# Patient Record
Sex: Female | Born: 1980
Health system: Southern US, Community
[De-identification: ages and names within clinical notes are randomized; demographics above are authoritative.]

## PROBLEM LIST (undated history)

## (undated) ENCOUNTER — Emergency Department (HOSPITAL_COMMUNITY): Admission: EM | Discharge status: Absent without leave | Payer: Medicaid Other

## (undated) DIAGNOSIS — M1990 Unspecified osteoarthritis, unspecified site: Secondary | ICD-10-CM

## (undated) DIAGNOSIS — F200 Paranoid schizophrenia: Secondary | ICD-10-CM

## (undated) DIAGNOSIS — IMO0001 Reserved for inherently not codable concepts without codable children: Secondary | ICD-10-CM

## (undated) DIAGNOSIS — D219 Benign neoplasm of connective and other soft tissue, unspecified: Secondary | ICD-10-CM

## (undated) DIAGNOSIS — I10 Essential (primary) hypertension: Secondary | ICD-10-CM

## (undated) DIAGNOSIS — N7091 Salpingitis, unspecified: Secondary | ICD-10-CM

## (undated) DIAGNOSIS — F40240 Claustrophobia: Secondary | ICD-10-CM

## (undated) HISTORY — PX: OTHER SURGICAL HISTORY: SHX169

## (undated) HISTORY — PX: TOOTH EXTRACTION: SUR596

## (undated) HISTORY — DX: Claustrophobia: F40.240

## (undated) HISTORY — DX: Paranoid schizophrenia: F20.0

## (undated) HISTORY — PX: BUNIONECTOMY: SHX129

---
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
## 1999-01-09 ENCOUNTER — Emergency Department (HOSPITAL_COMMUNITY): Admission: EM | Admit: 1999-01-09 | Discharge: 1999-01-09 | Payer: Self-pay | Admitting: Emergency Medicine

## 1999-10-01 ENCOUNTER — Emergency Department (HOSPITAL_COMMUNITY): Admission: EM | Admit: 1999-10-01 | Discharge: 1999-10-01 | Payer: Self-pay | Admitting: Emergency Medicine

## 1999-10-08 ENCOUNTER — Emergency Department (HOSPITAL_COMMUNITY): Admission: EM | Admit: 1999-10-08 | Discharge: 1999-10-08 | Payer: Self-pay | Admitting: Emergency Medicine

## 1999-11-24 ENCOUNTER — Emergency Department (HOSPITAL_COMMUNITY): Admission: EM | Admit: 1999-11-24 | Discharge: 1999-11-24 | Payer: Self-pay | Admitting: Internal Medicine

## 2000-03-13 ENCOUNTER — Emergency Department (HOSPITAL_COMMUNITY): Admission: EM | Admit: 2000-03-13 | Discharge: 2000-03-13 | Payer: Self-pay | Admitting: *Deleted

## 2000-09-02 ENCOUNTER — Emergency Department (HOSPITAL_COMMUNITY): Admission: EM | Admit: 2000-09-02 | Discharge: 2000-09-02 | Payer: Self-pay

## 2001-06-03 ENCOUNTER — Inpatient Hospital Stay (HOSPITAL_COMMUNITY): Admission: AD | Admit: 2001-06-03 | Discharge: 2001-06-12 | Payer: Self-pay | Admitting: Obstetrics & Gynecology

## 2001-06-17 ENCOUNTER — Inpatient Hospital Stay (HOSPITAL_COMMUNITY): Admission: AD | Admit: 2001-06-17 | Discharge: 2001-06-17 | Payer: Self-pay | Admitting: *Deleted

## 2001-06-29 ENCOUNTER — Observation Stay (HOSPITAL_COMMUNITY): Admission: AD | Admit: 2001-06-29 | Discharge: 2001-06-30 | Payer: Self-pay | Admitting: Obstetrics

## 2001-06-30 ENCOUNTER — Encounter: Payer: Self-pay | Admitting: Obstetrics

## 2001-07-24 ENCOUNTER — Emergency Department (HOSPITAL_COMMUNITY): Admission: EM | Admit: 2001-07-24 | Discharge: 2001-07-24 | Payer: Self-pay | Admitting: Emergency Medicine

## 2001-11-29 ENCOUNTER — Emergency Department (HOSPITAL_COMMUNITY): Admission: EM | Admit: 2001-11-29 | Discharge: 2001-11-29 | Payer: Self-pay | Admitting: Emergency Medicine

## 2001-11-29 ENCOUNTER — Encounter: Payer: Self-pay | Admitting: Emergency Medicine

## 2002-03-23 ENCOUNTER — Emergency Department (HOSPITAL_COMMUNITY): Admission: EM | Admit: 2002-03-23 | Discharge: 2002-03-23 | Payer: Self-pay | Admitting: Emergency Medicine

## 2002-04-07 ENCOUNTER — Emergency Department (HOSPITAL_COMMUNITY): Admission: EM | Admit: 2002-04-07 | Discharge: 2002-04-07 | Payer: Self-pay | Admitting: Emergency Medicine

## 2002-08-11 ENCOUNTER — Emergency Department (HOSPITAL_COMMUNITY): Admission: EM | Admit: 2002-08-11 | Discharge: 2002-08-11 | Payer: Self-pay | Admitting: Emergency Medicine

## 2002-09-18 ENCOUNTER — Emergency Department (HOSPITAL_COMMUNITY): Admission: EM | Admit: 2002-09-18 | Discharge: 2002-09-18 | Payer: Self-pay | Admitting: Emergency Medicine

## 2002-09-18 ENCOUNTER — Encounter: Payer: Self-pay | Admitting: Emergency Medicine

## 2006-02-25 ENCOUNTER — Emergency Department (HOSPITAL_COMMUNITY): Admission: EM | Admit: 2006-02-25 | Discharge: 2006-02-25 | Payer: Self-pay | Admitting: Emergency Medicine

## 2006-04-30 ENCOUNTER — Emergency Department (HOSPITAL_COMMUNITY): Admission: EM | Admit: 2006-04-30 | Discharge: 2006-04-30 | Payer: Self-pay | Admitting: Emergency Medicine

## 2006-05-23 ENCOUNTER — Inpatient Hospital Stay (HOSPITAL_COMMUNITY): Admission: EM | Admit: 2006-05-23 | Discharge: 2006-05-26 | Payer: Self-pay | Admitting: *Deleted

## 2006-05-26 ENCOUNTER — Ambulatory Visit: Payer: Self-pay | Admitting: Psychiatry

## 2006-05-26 ENCOUNTER — Inpatient Hospital Stay (HOSPITAL_COMMUNITY): Admission: AD | Admit: 2006-05-26 | Discharge: 2006-05-28 | Payer: Self-pay | Admitting: *Deleted

## 2007-02-19 ENCOUNTER — Inpatient Hospital Stay (HOSPITAL_COMMUNITY): Admission: AD | Admit: 2007-02-19 | Discharge: 2007-02-19 | Payer: Self-pay | Admitting: Obstetrics & Gynecology

## 2007-05-10 ENCOUNTER — Emergency Department (HOSPITAL_COMMUNITY): Admission: EM | Admit: 2007-05-10 | Discharge: 2007-05-10 | Payer: Self-pay | Admitting: Emergency Medicine

## 2007-09-16 ENCOUNTER — Emergency Department (HOSPITAL_COMMUNITY): Admission: EM | Admit: 2007-09-16 | Discharge: 2007-09-18 | Payer: Self-pay | Admitting: Emergency Medicine

## 2009-01-09 ENCOUNTER — Emergency Department (HOSPITAL_COMMUNITY): Admission: EM | Admit: 2009-01-09 | Discharge: 2009-01-09 | Payer: Self-pay | Admitting: Emergency Medicine

## 2009-03-01 ENCOUNTER — Emergency Department (HOSPITAL_COMMUNITY): Admission: EM | Admit: 2009-03-01 | Discharge: 2009-03-01 | Payer: Self-pay | Admitting: Emergency Medicine

## 2009-04-25 ENCOUNTER — Emergency Department (HOSPITAL_COMMUNITY): Admission: EM | Admit: 2009-04-25 | Discharge: 2009-04-26 | Payer: Self-pay | Admitting: Emergency Medicine

## 2010-05-13 ENCOUNTER — Inpatient Hospital Stay (HOSPITAL_COMMUNITY): Admission: AD | Admit: 2010-05-13 | Discharge: 2010-05-13 | Payer: Self-pay | Admitting: Obstetrics and Gynecology

## 2010-06-07 ENCOUNTER — Emergency Department (HOSPITAL_COMMUNITY): Admission: EM | Admit: 2010-06-07 | Discharge: 2010-06-07 | Payer: Self-pay | Admitting: Emergency Medicine

## 2010-06-08 ENCOUNTER — Emergency Department (HOSPITAL_COMMUNITY): Admission: EM | Admit: 2010-06-08 | Discharge: 2010-06-08 | Payer: Self-pay | Admitting: Emergency Medicine

## 2010-07-25 ENCOUNTER — Ambulatory Visit: Payer: Self-pay | Admitting: Family

## 2010-07-25 ENCOUNTER — Inpatient Hospital Stay (HOSPITAL_COMMUNITY): Admission: AD | Admit: 2010-07-25 | Discharge: 2010-07-25 | Payer: Self-pay | Admitting: Obstetrics & Gynecology

## 2010-09-19 ENCOUNTER — Emergency Department (HOSPITAL_COMMUNITY)
Admission: EM | Admit: 2010-09-19 | Discharge: 2010-09-19 | Payer: Self-pay | Source: Home / Self Care | Admitting: Emergency Medicine

## 2010-09-25 ENCOUNTER — Emergency Department (HOSPITAL_COMMUNITY): Admission: EM | Admit: 2010-09-25 | Discharge: 2010-02-05 | Payer: Self-pay | Admitting: Emergency Medicine

## 2010-12-29 LAB — URINALYSIS, ROUTINE W REFLEX MICROSCOPIC
Ketones, ur: NEGATIVE mg/dL
Nitrite: NEGATIVE
Protein, ur: NEGATIVE mg/dL
pH: 5 (ref 5.0–8.0)

## 2010-12-29 LAB — DIFFERENTIAL
Basophils Relative: 0 % (ref 0–1)
Eosinophils Absolute: 0.1 10*3/uL (ref 0.0–0.7)
Lymphs Abs: 3.6 10*3/uL (ref 0.7–4.0)
Neutrophils Relative %: 55 % (ref 43–77)

## 2010-12-29 LAB — COMPREHENSIVE METABOLIC PANEL
ALT: 20 U/L (ref 0–35)
CO2: 24 mEq/L (ref 19–32)
Calcium: 9.3 mg/dL (ref 8.4–10.5)
GFR calc non Af Amer: >60 mL/min (ref >60)
Glucose, Bld: 92 mg/dL (ref 70–99)
Sodium: 137 mEq/L (ref 135–145)

## 2010-12-29 LAB — LIPASE, BLOOD: Lipase: 26 U/L (ref 11–59)

## 2010-12-29 LAB — CBC
HCT: 40.4 % (ref 36.0–46.0)
Hemoglobin: 14 g/dL (ref 12.0–15.0)
MCH: 30 pg (ref 26.0–34.0)
MCHC: 34.7 g/dL (ref 30.0–36.0)
MCV: 86.7 fL (ref 78.0–100.0)

## 2010-12-29 LAB — POCT CARDIAC MARKERS
Myoglobin, poc: 45.6 ng/mL (ref 12–200)
Troponin i, poc: <0.05 ng/mL (ref 0.00–0.09)

## 2010-12-29 LAB — ETHANOL: Alcohol, Ethyl (B): 264 mg/dL — ABNORMAL HIGH (ref 0–10)

## 2010-12-31 LAB — URINALYSIS, ROUTINE W REFLEX MICROSCOPIC
Ketones, ur: NEGATIVE mg/dL
Nitrite: NEGATIVE
Protein, ur: NEGATIVE mg/dL

## 2010-12-31 LAB — WET PREP, GENITAL: Yeast Wet Prep HPF POC: NONE SEEN

## 2011-01-01 LAB — URINALYSIS, ROUTINE W REFLEX MICROSCOPIC
Glucose, UA: NEGATIVE mg/dL
Protein, ur: 30 mg/dL — AB
pH: 5 (ref 5.0–8.0)

## 2011-01-01 LAB — WET PREP, GENITAL
Clue Cells Wet Prep HPF POC: NONE SEEN
Yeast Wet Prep HPF POC: NONE SEEN

## 2011-01-01 LAB — URINE CULTURE
Colony Count: 75000
Culture  Setup Time: 201108220336

## 2011-01-01 LAB — URINE MICROSCOPIC-ADD ON

## 2011-01-01 LAB — POCT PREGNANCY, URINE: Preg Test, Ur: NEGATIVE

## 2011-01-03 LAB — CBC
HCT: 38.7 % (ref 36.0–46.0)
Hemoglobin: 12.9 g/dL (ref 12.0–15.0)
MCV: 92.5 fL (ref 78.0–100.0)
RBC: 4.18 MIL/uL (ref 3.87–5.11)
WBC: 8.6 10*3/uL (ref 4.0–10.5)

## 2011-01-03 LAB — GC/CHLAMYDIA PROBE AMP, GENITAL: Chlamydia, DNA Probe: NEGATIVE

## 2011-01-03 LAB — URINALYSIS, ROUTINE W REFLEX MICROSCOPIC
Bilirubin Urine: NEGATIVE
Leukocytes, UA: NEGATIVE
Nitrite: NEGATIVE
Specific Gravity, Urine: 1.02 (ref 1.005–1.030)
Urobilinogen, UA: 0.2 mg/dL (ref 0.0–1.0)
pH: 5.5 (ref 5.0–8.0)

## 2011-01-03 LAB — URINE MICROSCOPIC-ADD ON

## 2011-01-03 LAB — POCT PREGNANCY, URINE: Preg Test, Ur: NEGATIVE

## 2011-01-26 LAB — COMPREHENSIVE METABOLIC PANEL
ALT: 11 U/L (ref 0–35)
Albumin: 3.9 g/dL (ref 3.5–5.2)
Alkaline Phosphatase: 66 U/L (ref 39–117)
BUN: 6 mg/dL (ref 6–23)
Chloride: 107 mEq/L (ref 96–112)
Potassium: 4.2 mEq/L (ref 3.5–5.1)
Sodium: 142 mEq/L (ref 135–145)
Total Bilirubin: 0.4 mg/dL (ref 0.3–1.2)

## 2011-01-26 LAB — URINALYSIS, ROUTINE W REFLEX MICROSCOPIC
Bilirubin Urine: NEGATIVE
Hgb urine dipstick: NEGATIVE
Nitrite: NEGATIVE
Specific Gravity, Urine: 1.003 — ABNORMAL LOW (ref 1.005–1.030)
Urobilinogen, UA: 0.2 mg/dL (ref 0.0–1.0)
pH: 6 (ref 5.0–8.0)

## 2011-01-26 LAB — WET PREP, GENITAL: Yeast Wet Prep HPF POC: NONE SEEN

## 2011-01-26 LAB — DIFFERENTIAL
Basophils Absolute: 0.1 10*3/uL (ref 0.0–0.1)
Basophils Relative: 1 % (ref 0–1)
Eosinophils Absolute: 0.1 10*3/uL (ref 0.0–0.7)
Eosinophils Relative: 1 % (ref 0–5)
Monocytes Absolute: 0.9 10*3/uL (ref 0.1–1.0)
Neutro Abs: 6.6 10*3/uL (ref 1.7–7.7)

## 2011-01-26 LAB — CBC
HCT: 38.6 % (ref 36.0–46.0)
Hemoglobin: 12.6 g/dL (ref 12.0–15.0)
Platelets: 314 10*3/uL (ref 150–400)
WBC: 10.8 10*3/uL — ABNORMAL HIGH (ref 4.0–10.5)

## 2011-01-26 LAB — POCT PREGNANCY, URINE: Preg Test, Ur: NEGATIVE

## 2011-03-06 NOTE — Discharge Summary (Signed)
NAMEKRISTYL, ATHENS NO.:  [id number]   MEDICAL RECORD NO.:  [id number]          PATIENT TYPE:  INP   LOCATION:  6711                         FACILITY:  [facility]   PHYSICIAN:  Lonia Blood, M.D.       DATE OF BIRTH:  1981/06/18   DATE OF ADMISSION:  05/23/2006  DATE OF DISCHARGE:  05/25/2006                                 DISCHARGE SUMMARY   DISCHARGE DIAGNOSES:  1.  Intentional medication overdose.  2.  Suicide attempt.  3.  Hypoglycemia secondary to the metformin that the patient ingested to      commit suicide, now resolved.  4.  History of tubo-ovarian abscess.  5.  Pelvic inflammatory disease in 2002.  6.  Recent infection with chlamydia.  7.  Cocaine abuse.  8.  Alcohol abuse.  9.  Marijuana abuse.   DISCHARGE MEDICATIONS:  1.  Folic acid 1 mg daily.  2.  Vitamin B 100 mg daily.   CONDITION ON DISCHARGE:  The patient will be transferred to the [facility] where she will require inpatient psychiatric admission as per  the psychiatry notes.   PROCEDURES THIS ADMISSION:  No procedures were done.   CONSULTATIONS:  Dr. Jeanie Sewer from psychiatry.   HISTORY AND PHYSICAL:  For admission history and physical, refer to the  dictated history and physical by Dr. Darene Lamer on May 23, 2006.   HOSPITAL COURSE:  1.  Hypoglycemia secondary to metformin overdose.  Ms. Mcbroom was      admitted to the regular unit of [facility].  She was placed on      intravenous dextrose and she was offered a regular diet.  Her      hypoglycemia resolved gradually throughout the day of May 24, 2006.      By May 25, 2006, the patient's CBGs had been above 80 and she was      deemed stable and safe to be discharged to the [facility]      for inpatient psychiatric admission.  2.  Suicidal gesture, suicidal attempt with metformin.  The patient will be      transferred to [facility] for inpatient psychiatric evaluation      and treatment  as needed.  3.  Polysubstance abuse including alcohol, cannabis, cocaine.  We have      counseled Ms. Matura about dangers      in using illicit drugs, as well as heavy amounts of alcohol.  She voices      understanding and she will probably follow up with alcohol rehab      services as needed.  4.  History of alcohol abuse.  The patient was started on folic acid and      thiamine at the time of discharge.      Lonia Blood, M.D.  Electronically Signed     SL/MEDQ  D:  05/25/2006  T:  05/25/2006  Job:  [id number]

## 2011-03-06 NOTE — Consult Note (Signed)
NAME:  Jessica Case, KRIST NO.:  [id number]   MEDICAL RECORD NO.:  [id number]          PATIENT TYPE:  IPS   LOCATION:  NA                            [facility]  [facility]   PHYSICIAN:  Antonietta Breach, M.D.  DATE OF BIRTH:  06/15/1981   DATE OF CONSULTATION:  DATE OF DISCHARGE:                                   CONSULTATION   DATE OF CONSULTATION:  May 24, 2006   REQUESTING PHYSICIAN:  Lonia Blood, M.D.   REASON FOR CONSULTATION:  Depression and overdose.   HISTORY OF PRESENT ILLNESS:  The patient is a 30 year old female admitted to  the [facility] on May 23, 2006 after an overdose of Metformin.   The patient states that she recently has been drinking 3/5 of alcohol per  day as well as using regular crack cocaine.  She has had thoughts of killing  herself and intended to kill herself with this overdose.  Her mood is very  depressed.  Her energy is poor.  Her concentration is poor.   The patient obtained a Metformin from one of her relative's supplies in the  cabinet at her house.   PAST PSYCHIATRIC HISTORY:  The patient has a history of multiple suicide  attempts and multiple psychiatric admissions.  She is a poor historian at  this time.   FAMILY PSYCHIATRIC HISTORY:  None known.   SOCIAL HISTORY:  The patient is single.  In a separate history in the  medical record the patient stated that she was drinking up to 3 liters of  wine per day.  She also acknowledge smoking marijuana.  She lives with her  relatives.  Occupation:  Unemployed.   GENERAL MEDICAL PROBLEMS:  Status post overdose of Metformin, cocaine and  alcohol.   MEDICATIONS:  The MAR is reviewed.   ALLERGIES:  No known drug allergies.   PSYCHOTROPIC:  At this time are none.   LABORATORY DATA:  Basic metabolic panel is unremarkable.  Urine pregnancy  test is negative.  Tylenol is negative.  Aspirin is negative.  Urine drug  screen was positive for cocaine.  Alcohol is negative.   REVIEW OF SYSTEMS:  Constitutional:  Afebrile.  Head:  No trauma.  Eyes:  No visual changes.  Ears:  No hearing impairment.  Nose:  No rhinorrhea.  Mouth and throat:  No sore throat.  Neurologic:  Unremarkable.  Psychiatric:  As above.  Cardiovascular:  No chest pain, palpitations or edema.  Respiratory:  No coughing or wheezing.  Gastrointestinal:  No nausea, vomiting or diarrhea.  Genitourinary:  No dysuria.  Skin:  Unremarkable.  Endocrine and metabolic:  Unremarkable.  Hematologic and lymphatic:  Unremarkable.  Musculoskeletal:  No deformities, weakness or atrophy.   EXAMINATION:  Vital signs:  Temperature 98.6, pulse 72, respirations 20,  blood pressure 109/63.   METAL STATUS EXAM:  The patient is a young female sitting in her hospital  bed with fair eye contact, constricted affect, depressed mood, decreased  concentration.  She is oriented to all spheres.  Her fund of knowledge and  intelligence are estimated at average.  Thought process  is coherent.  Thought content suicidal ideation as in the history of present illness.  No  thoughts of harming others.  No delusions.  No hallucinations.  Judgment is  intact for the need of treatment.  Her insight is also intact partially for  her depression and substance dependence.  Her memory is intact to immediate,  recent, remote except for the blackout periods of overdose.   ASSESSMENT:  Axis I:  Mood disorder not otherwise specified, depressed  (functional and substance induced elements).  Diagnosis:  Polysubstance dependence.  Axis II:  Deferred.  Axis III:  See general medical problems.  Axis IV:  Primary support group.  Axis V:  35.   The patient is still at risk to harm herself.   RECOMMENDATIONS:  1.  Continue the sitter for suicide precautions.  2.  Psychotropic medication is deferred at this time.  3.  Once patient is medically cleared would admit her a psychiatric ward for      dual diagnosis evaluation and treatment.   The patient agrees to      voluntary admission.      Antonietta Breach, M.D.  Electronically Signed     JW/MEDQ  D:  05/24/2006  T:  05/25/2006  Job:  [id number]

## 2011-03-06 NOTE — Consult Note (Signed)
NAME:  Jessica Case, Jessica Case NO.:  [id number]   MEDICAL RECORD NO.:  [id number]          PATIENT TYPE:  IPS   LOCATION:  0507                          [facility]ACILITY:  [facility]   PHYSICIAN:  Antonietta Breach, M.D.  DATE OF BIRTH:  1981-05-07   DATE OF CONSULTATION:  05/26/2006  DATE OF DISCHARGE:                                   CONSULTATION   [name] continues with several days of depressed mood, poor  concentration, poor energy, anhedonia and suicidal thoughts.  She is now  medically clear to leave the [facility].   Her CBGs have been running 109, 124 and 87.  Vital signs:  Temperature 97.5,  pulse 75, respirations 22, blood pressure 115/77.   On exam, as above.  The patient is able to cooperate.  Her thought process  is coherent.  Her concentration is decreased.  Her judgment is impaired.   ASSESSMENT:  293.83 mood disorder not otherwise specified, depressed.   Major depressive disorder recurrent, severe.   [name] has a history of multiple suicide attempts including the  intentional overdose that precipitated this hospitalization.   RECOMMENDATIONS:  1.  Admission to a psychiatric unit for further evaluation and treatment of      depression.  2.  Continue the sitter for suicide prevention on the medical ward until      transferred.  3.  Psychotropics deferred.      Antonietta Breach, M.D.  Electronically Signed     JW/MEDQ  D:  05/26/2006  T:  05/26/2006  Job:  [id number]

## 2011-03-06 NOTE — H&P (Signed)
NAME:  RAZIYAH, VANVLECK NO.:  [id number]   MEDICAL RECORD NO.:  [id number]          PATIENT TYPE:  INP   LOCATION:  1826                         FACILITY:  [facility]   PHYSICIAN:  Salomon Fick, N.P.    DATE OF BIRTH:  June 17, 1981   DATE OF ADMISSION:  05/23/2006  DATE OF DISCHARGE:                                HISTORY & PHYSICAL   CHIEF COMPLAINT:  Overdose of metformin.   HISTORY OF PRESENT ILLNESS:  Ms. Bost is a 30 year old, African-  American lady who was brought to the ER by her relatives after she  apparently took 10 pills of metformin at approximately 3 p.m.  She states  she wanted to kill herself.  She took these 10 pills from the cabinet; it  belongs to one of her relatives who lives with her.  She also smoked crack  cocaine after that, and right now she is drowsy but easily arousable.  She  denies having any other complaints at this point in time.  After coming to  the ER, [facility] was contacted.  They accepted the patient, but the  patient was hypoglycemic and so is getting admitted to the medical team.   PAST MEDICAL HISTORY:  None.   PAST SURGICAL HISTORY:  None.   ALLERGIES:  None.   MEDICATIONS:  None.   SOCIAL HISTORY:  She smokes one pack per day for approximately seven years.  She takes alcohol on a regular basis.  She states she drinks up to three  liters of wine a day.  She smokes crack on a regular basis.  She also takes  marijuana.  She is unemployed and lives with relatives.   FAMILY HISTORY:  Her parents are healthy.   REVIEW OF SYSTEMS:  She denies any recent weight loss or weight gain.  No  fever, chills.  HEENT:  No headaches, no blurred vision, no sore throat.  CARDIOVASCULAR:  Denies any chest pain or palpitations.  RESPIRATORY:  No  shortness of breath, cough.  GASTROINTESTINAL:  Denies abdominal pain,  nausea, vomiting, diarrhea or constipation.   PHYSICAL EXAMINATION:  GENERAL:  She is alert but dozes off  easily.  She is  oriented to time, place and person.  VITAL SIGNS:  Temperature 98.3, pulse rate of 90, blood pressure 138/77,  respiratory rate 28.  HEENT:  Normocephalic.  Pupils bilaterally __________.  Mucosa moist.  NECK:  Supple, no JVD, no thyromegaly.  CARDIOVASCULAR:  S1, S2 sounds heard.  Regular rate and rhythm.  No murmurs,  rubs or gallops.  CHEST:  Clear to auscultation.  ABDOMEN:  Soft.  Bowel sounds heard.  NEUROLOGIC:  No focal deficits.   LABORATORY DATA:  She had a pregnancy test done in the ER, which was  negative.  Her urine drug screen was positive for cocaine.  Her urinalysis  was negative.  She had a sodium of 139, potassium 3.1, chloride 105, BUN 12,  creatinine 1.1, glucose of 60.   IMPRESSION:  1.  Hypoglycemia secondary to metformin overdose.  2.  Suicidal ideation.   PLAN:  A 30 year old lady with suicidal ideation,  took 10 pills of  metformin.  She is mildly hypoglycemic secondary to her metformin.  The  metformin clears her body in about 12-16 hours, so we have to watch her  blood sugars until then.  We will get q.2h. blood sugars on her for the next  8 hours and I also put her on D5 normal saline to keep her blood sugars up.  Her potassium is low at 3.1; we will replace potassium.  She has been  accepted by [facility], so she will go to [facility] on  discharge.  She will have to have a sitter with her because of suicidal  ideation on the floor.      Salomon Fick, N.P.     MES/MEDQ  D:  05/23/2006  T:  05/23/2006  Job:  [id number]

## 2011-03-06 NOTE — Discharge Summary (Signed)
[facility]  Patient:    Jessica Case, Jessica Case Visit Number: [id number] MRN: [id number]          Service Type: GYN Location: [facility] Attending Physician:  Michaelle Copas Dictated by:   Marlinda Mike, CNM Admit Date:  06/17/2001 Discharge Date: 06/17/2001                             Discharge Summary  ADMISSION DIAGNOSES:          1. Tubo-ovarian abscess.                               2. Pelvic inflammatory disease.  HISTORY OF PRESENT ILLNESS:   The patient is a transfer from the [facility] Emergency Room.  GYN on call at [facility] was Miguel Aschoff, M.D.  On transfer to [facility], care transferred to the teaching service.  The patient is a 30 year old, gravida 2, para 0-1-1-0, with a presenting complaint of abdominal pain.  The patient has no surgical history.  She has a history of asthma and also irregular heartbeat.  She has been treated in the past for gonorrhea, chlamydia, and abnormal vaginal bleeding.  The patient admits to tobacco use, three to four cigarettes a day, alcohol use four to five times a weeks, and also use of marijuana and crack cocaine.  Last menstrual period of May 21, 2001.  ADMISSION ORDERS:             Cefotetan and doxycycline IV per Miguel Aschoff, M.D.  On day #2 of hospitalization, antibiotics changed to gentamicin and clindamycin and started on Motrin therapy per Leonette Most A. Clearance Coots, M.D.  The patient received Phenergan p.r.n. IV/p.o. as needed for nausea and vomiting. Medicine during the hospitalization for pain:  Demerol initially and then changed to Tylox.  Tylox produced itching.  Medicine discontinued.  Benadryl p.r.n. for the itching and changed to Darvocet p.r.n.  LABORATORY WORK DURING HOSPITALIZATION:              White blood cell count 19.6, hemoglobin 11.2, hematocrit 33.  Wet prep was positive for white blood cells, bacteria, and many yeast.  GC culture was  negative.  Chlamydia culture was positive.  The hepatitis screen was negative.  The HIV screen was negative.  The RPR screen was negative.  The urine drug screen was positive for cocaine.  HOSPITAL COURSE:              The patient remained afebrile during the hospitalization.  There were some labile blood pressures with diastolics in the 80s and 90s from a baseline of diastolic rate of 65H on days #5 and #6 of hospitalization, which then resolved to normal blood pressure range.  DISPOSITION:                  The patient was discharged on day #9.  FOLLOW-UP:                    To follow up at the GYN clinic in two weeks with Roseanna Rainbow, M.D.  The patient is to call with any further abdominal pain, discharge, or bleeding.  DISCHARGE MEDICATIONS:        A course of doxycycline and Flagyl x 2 weeks.  DISCHARGE STATUS:  Stable. Dictated by:   Marlinda Mike, CNM Attending Physician:  Michaelle Copas DD:  06/29/01 TD:  06/29/01 Job: [id number]

## 2011-03-06 NOTE — Discharge Summary (Signed)
NAME:  Jessica Case, Jessica Case NO.:  [id number]   MEDICAL RECORD NO.:  [id number]          PATIENT TYPE:  IPS   LOCATION:  0507                          [facility]ACILITY:  [facility]   PHYSICIAN:  Anselm Jungling, MD  DATE OF BIRTH:  01/15/1981   DATE OF ADMISSION:  05/26/2006  DATE OF DISCHARGE:  05/28/2006                                 DISCHARGE SUMMARY   IDENTIFYING DATA/REASON FOR ADMISSION:  The patient is a 30 year old single  African American female admitted after a suicide attempt via overdose.  She  came to [place] with a history of cocaine dependence and [facility].  This was her first ever inpatient psychiatric  hospitalization that we are aware of.  She denied any previous treatment for  depression.  She indicated that she wanted help and regretted her overdose.  Please refer to the admission note for further details pertaining to the  symptoms, circumstances and history that led to her hospitalization.   INITIAL DIAGNOSTIC IMPRESSION:  She was given an initial AXIS I diagnosis of  depressive disorder not otherwise specified, rule out substance-induced mood  disorder, and cocaine dependence.   MEDICAL/LABORATORY:  The patient was medically and physically assessed by  the psychiatric nurse practitioner after having been treated for her  overdose in another facility.  There were no active or chronic medical  problems.   HOSPITAL COURSE:  The patient was admitted to the adult inpatient  psychiatric service.  She presented as a well-nourished, well-developed  [place] female who was alert, fully oriented, pleasant and  friendly.  She stated I'm doing fine in the first interview.  However, she  easily became tearful and depressed over her hospitalization.  She denied  suicidal ideation, stating I will never do that again.  She did not  present with any signs or symptoms of psychosis or thought disorder.   The patient agreed to a  trial of Lexapro 10 mg daily for depression.  She  was placed on Symmetrel 100 mg b.i.d. for cocaine cravings.  She  participated in various therapeutic groups and activities.  She was somewhat  hyperactive at times during her hospital stay, but basically appropriate,  redirectable, and this did not appear to reflect any underlying psychosis or  mood disorder.   On the third hospital day, the patient indicated that she wanted to be  discharged.  She continued to deny suicidal ideation and stated that she  wanted to get off cocaine for good.   AFTERCARE:  The patient was to follow up at the [facility] with an  appointment on June 03, 2006.  She was instructed to attend daily NA or AA  meetings, and lists of local programs and meetings were given to her.   DISCHARGE MEDICATIONS:  1. Lexapro 10 mg daily.  2. Symmetrel 100 mg twice daily.  3. Risperdal 0.5 mg at bedtime, which was started during her stay to      address anxiety and insomnia, with good results.   DISCHARGE DIAGNOSES:  AXIS I:  Cocaine dependence.  Substance-induced mood  disorder, depression.  AXIS II:  Deferred.  AXIS III:  No acute or chronic illnesses.  AXIS IV:  Stressors:  Severe.  AXIS V:  GAF on discharge 60.      Anselm Jungling, MD  Electronically Signed     SPB/MEDQ  D:  05/31/2006  T:  05/31/2006  Job:  [id number]

## 2011-05-15 ENCOUNTER — Emergency Department (HOSPITAL_COMMUNITY)
Admission: EM | Admit: 2011-05-15 | Discharge: 2011-05-15 | Disposition: A | Discharge status: Home / Self Care | Payer: Self-pay | Attending: Emergency Medicine | Admitting: Emergency Medicine

## 2011-05-15 ENCOUNTER — Emergency Department (HOSPITAL_COMMUNITY): Payer: Self-pay

## 2011-05-15 DIAGNOSIS — F329 Major depressive disorder, single episode, unspecified: Secondary | ICD-10-CM | POA: Insufficient documentation

## 2011-05-15 DIAGNOSIS — M79609 Pain in unspecified limb: Secondary | ICD-10-CM | POA: Insufficient documentation

## 2011-05-15 DIAGNOSIS — K5900 Constipation, unspecified: Secondary | ICD-10-CM | POA: Insufficient documentation

## 2011-05-15 DIAGNOSIS — R1012 Left upper quadrant pain: Secondary | ICD-10-CM | POA: Insufficient documentation

## 2011-05-15 DIAGNOSIS — M21619 Bunion of unspecified foot: Secondary | ICD-10-CM | POA: Insufficient documentation

## 2011-05-15 DIAGNOSIS — F3289 Other specified depressive episodes: Secondary | ICD-10-CM | POA: Insufficient documentation

## 2011-05-15 LAB — DIFFERENTIAL
Basophils Absolute: 0 10*3/uL (ref 0.0–0.1)
Eosinophils Absolute: 0.1 10*3/uL (ref 0.0–0.7)
Eosinophils Relative: 1 % (ref 0–5)
Lymphocytes Relative: 23 % (ref 12–46)
Lymphs Abs: 2.5 10*3/uL (ref 0.7–4.0)
Neutrophils Relative %: 66 % (ref 43–77)

## 2011-05-15 LAB — POCT PREGNANCY, URINE: Preg Test, Ur: NEGATIVE

## 2011-05-15 LAB — CBC
HCT: 35.7 % — ABNORMAL LOW (ref 36.0–46.0)
MCV: 86.7 fL (ref 78.0–100.0)
Platelets: 290 10*3/uL (ref 150–400)
RBC: 4.12 MIL/uL (ref 3.87–5.11)
RDW: 13.3 % (ref 11.5–15.5)
WBC: 11 10*3/uL — ABNORMAL HIGH (ref 4.0–10.5)

## 2011-05-15 LAB — URINALYSIS, ROUTINE W REFLEX MICROSCOPIC
Leukocytes, UA: NEGATIVE
Nitrite: NEGATIVE
Specific Gravity, Urine: 1.007 (ref 1.005–1.030)
Urobilinogen, UA: 0.2 mg/dL (ref 0.0–1.0)
pH: 7 (ref 5.0–8.0)

## 2011-05-15 LAB — POCT I-STAT, CHEM 8
Chloride: 105 mEq/L (ref 96–112)
HCT: 41 % (ref 36.0–46.0)
Hemoglobin: 13.9 g/dL (ref 12.0–15.0)
Potassium: 3.7 mEq/L (ref 3.5–5.1)

## 2011-07-28 LAB — RAPID URINE DRUG SCREEN, HOSP PERFORMED
Amphetamines: NOT DETECTED
Benzodiazepines: NOT DETECTED
Benzodiazepines: NOT DETECTED
Cocaine: POSITIVE — AB
Cocaine: POSITIVE — AB
Opiates: NOT DETECTED
Opiates: NOT DETECTED
Tetrahydrocannabinol: NOT DETECTED
Tetrahydrocannabinol: NOT DETECTED

## 2011-07-28 LAB — ACETAMINOPHEN LEVEL
Acetaminophen (Tylenol), Serum: <10 — ABNORMAL LOW
Acetaminophen (Tylenol), Serum: <10 — ABNORMAL LOW

## 2011-07-28 LAB — TRICYCLICS SCREEN, URINE: TCA Scrn: NOT DETECTED

## 2011-07-28 LAB — DIFFERENTIAL
Basophils Absolute: 0.1
Basophils Relative: 1
Eosinophils Absolute: 0.1 — ABNORMAL LOW
Eosinophils Relative: 1
Monocytes Absolute: 1.3 — ABNORMAL HIGH
Monocytes Relative: 12

## 2011-07-28 LAB — SALICYLATE LEVEL: Salicylate Lvl: <4

## 2011-07-28 LAB — POCT I-STAT CREATININE: Operator id: 272551

## 2011-07-28 LAB — POCT PREGNANCY, URINE
Operator id: 272551
Preg Test, Ur: NEGATIVE

## 2011-07-28 LAB — I-STAT 8, (EC8 V) (CONVERTED LAB)
BUN: 8
Bicarbonate: 27.2 — ABNORMAL HIGH
Glucose, Bld: 92
Hemoglobin: 15
Sodium: 138
TCO2: 29
pH, Ven: 7.374 — ABNORMAL HIGH

## 2011-07-28 LAB — CBC
Hemoglobin: 12.9
MCHC: 32.9
MCV: 89.9
RBC: 4.36
RDW: 14.4

## 2011-08-03 LAB — POCT PREGNANCY, URINE
Operator id: 151391
Preg Test, Ur: NEGATIVE

## 2012-06-28 ENCOUNTER — Emergency Department (HOSPITAL_COMMUNITY)
Admission: EM | Admit: 2012-06-28 | Discharge: 2012-06-28 | Disposition: A | Discharge status: Home / Self Care | Payer: Self-pay | Attending: Emergency Medicine | Admitting: Emergency Medicine

## 2012-06-28 ENCOUNTER — Encounter (HOSPITAL_COMMUNITY): Payer: Self-pay | Admitting: *Deleted

## 2012-06-28 DIAGNOSIS — M79609 Pain in unspecified limb: Secondary | ICD-10-CM

## 2012-06-28 DIAGNOSIS — M79661 Pain in right lower leg: Secondary | ICD-10-CM

## 2012-06-28 DIAGNOSIS — M7989 Other specified soft tissue disorders: Secondary | ICD-10-CM

## 2012-06-28 DIAGNOSIS — Z91018 Allergy to other foods: Secondary | ICD-10-CM | POA: Insufficient documentation

## 2012-06-28 DIAGNOSIS — F17200 Nicotine dependence, unspecified, uncomplicated: Secondary | ICD-10-CM | POA: Insufficient documentation

## 2012-06-28 LAB — CBC WITH DIFFERENTIAL/PLATELET
Basophils Absolute: 0 10*3/uL (ref 0.0–0.1)
Basophils Relative: 0 % (ref 0–1)
Eosinophils Absolute: 0.1 10*3/uL (ref 0.0–0.7)
Hemoglobin: 13 g/dL (ref 12.0–15.0)
MCHC: 33.9 g/dL (ref 30.0–36.0)
Monocytes Relative: 14 % — ABNORMAL HIGH (ref 3–12)
Neutro Abs: 5.2 10*3/uL (ref 1.7–7.7)
Neutrophils Relative %: 66 % (ref 43–77)
Platelets: 283 10*3/uL (ref 150–400)
RDW: 13.3 % (ref 11.5–15.5)

## 2012-06-28 MED ORDER — HYDROCODONE-ACETAMINOPHEN 5-325 MG PO TABS: 1.0000 | ORAL_TABLET | ORAL | Status: AC | PRN

## 2012-06-28 MED ORDER — HYDROCODONE-ACETAMINOPHEN 5-325 MG PO TABS
1.0000 | ORAL_TABLET | Freq: Once | ORAL | Status: AC
  Administered 2012-06-28: 1 via ORAL
  Filled 2012-06-28: qty 1

## 2012-06-28 NOTE — Progress Notes (Signed)
Right:  No evidence of DVT, superficial thrombosis, or Baker's cyst.  Left:  Negative for DVT in the common femoral vein.  

## 2012-06-28 NOTE — ED Notes (Signed)
Pt reports swelling to right lateral calf. Denies itching. Area mildly warm to touch.

## 2012-06-28 NOTE — ED Provider Notes (Signed)
History     CSN: [id number]  Arrival date & time 06/28/12  1478   First MD Initiated Contact with Patient 06/28/12 [phone]      Chief Complaint  Patient presents with  . Abscess    (Consider location/radiation/quality/duration/timing/severity/associated sxs/prior treatment) HPI Comments: Patient presents with right lateral and posterior calf pain and swelling. Patient states the pain started last night and she noticed the swelling this morning. The pain is described as a 8/10 throbbing pain in her right lateral and posterior calf that is tender to the touch. She reports being nauseated this morning, denies vomiting. She states walking on her right foot makes the pain worse.  She took tylenol for the pain and states that it did not help.  Denies fever, vomiting, diarrhea, constipation, dysuria, change in BMs or urination, hematuria, hematochezia, weakness, tingling or numbness in her right foot. Denies recent bug bites. Denies pruritis of the right calf. Denies recent illness. Denies recent travel, long car trips, recent immobilization. Reports being active with no changes in activity recently. Denies h/o or family h/o clotting disorders or blood clots. Denies estrogen use. Patient is a current everyday smoker.   Patient is a 31 y.o. female presenting with abscess. The history is provided by the patient.  Abscess  Pertinent negatives include no fever, no diarrhea and no vomiting.    History reviewed. No pertinent past medical history.  History reviewed. No pertinent past surgical history.  History reviewed. No pertinent family history.  History  Substance Use Topics  . Smoking status: Current Everyday Smoker  . Smokeless tobacco: Not on file  . Alcohol Use: Yes     occ    OB History    Grav Para Term Preterm Abortions TAB SAB Ect Mult Living                  Review of Systems  Constitutional: Negative for fever.  Cardiovascular: Positive for leg swelling.  Gastrointestinal:  Negative for nausea, vomiting, abdominal pain and diarrhea.  Genitourinary: Negative for dysuria, urgency and frequency.  Neurological: Negative for weakness and numbness.    Allergies  Strawberry  Home Medications   Current Outpatient Rx  Name Route Sig Dispense Refill  . DIPHENHYDRAMINE-APAP (SLEEP) 25-500 MG PO TABS Oral Take 3 tablets by mouth at bedtime as needed. For sleep      BP 157/97  Pulse 81  Temp 98.3 F (36.8 C)  Resp 16  SpO2 100%  Physical Exam  Nursing note and vitals reviewed. Constitutional: She appears well-developed and well-nourished. No distress.  HENT:  Head: Normocephalic and atraumatic.  Neck: Neck supple.  Pulmonary/Chest: Effort normal.  Musculoskeletal:       Legs: Neurological: She is alert.  Skin: She is not diaphoretic.    ED Course  Procedures (including critical care time)  Labs Reviewed - No data to display No results found.   1. Pain of right lower leg       MDM   Pt with pain and swelling over right lower leg.  Pt with localized area of edema.  No apparent collection.  No fevers, no erythema or warmth of the leg.  Does not appear to be cellulitis or abscess.  Doppler US done as patient did have calf tenderness - this was negative.  Pt denies any injury to the area but does have a small area of ecchymosis at posterior aspect of swelling.  This is tender to palpation.  This is possibly a contusion.  Doubt  only because pt doesn't remember any injury.  Could also be musculoskeletal.  Neurovascularly intact.  Pt to be treated symptomatic.  Discussed return precautions with pt.  Pt verbalizes understanding and agrees with plan.          [place], [place] 06/28/12 1614

## 2012-06-29 NOTE — ED Provider Notes (Signed)
Medical screening examination/treatment/procedure(s) were performed by non-physician practitioner and as supervising physician I was immediately available for consultation/collaboration.   Loren Racer, MD 06/29/12 [phone]

## 2012-12-30 ENCOUNTER — Encounter (HOSPITAL_COMMUNITY): Payer: Self-pay | Admitting: *Deleted

## 2012-12-30 ENCOUNTER — Emergency Department (HOSPITAL_COMMUNITY)
Admission: EM | Admit: 2012-12-30 | Discharge: 2012-12-30 | Disposition: A | Discharge status: Home / Self Care | Payer: Self-pay | Attending: Emergency Medicine | Admitting: Emergency Medicine

## 2012-12-30 DIAGNOSIS — R6884 Jaw pain: Secondary | ICD-10-CM | POA: Insufficient documentation

## 2012-12-30 DIAGNOSIS — G8918 Other acute postprocedural pain: Secondary | ICD-10-CM | POA: Insufficient documentation

## 2012-12-30 DIAGNOSIS — J3489 Other specified disorders of nose and nasal sinuses: Secondary | ICD-10-CM | POA: Insufficient documentation

## 2012-12-30 DIAGNOSIS — K029 Dental caries, unspecified: Secondary | ICD-10-CM | POA: Insufficient documentation

## 2012-12-30 DIAGNOSIS — R51 Headache: Secondary | ICD-10-CM | POA: Insufficient documentation

## 2012-12-30 DIAGNOSIS — K089 Disorder of teeth and supporting structures, unspecified: Secondary | ICD-10-CM | POA: Insufficient documentation

## 2012-12-30 DIAGNOSIS — H9209 Otalgia, unspecified ear: Secondary | ICD-10-CM | POA: Insufficient documentation

## 2012-12-30 DIAGNOSIS — F17200 Nicotine dependence, unspecified, uncomplicated: Secondary | ICD-10-CM | POA: Insufficient documentation

## 2012-12-30 MED ORDER — OXYCODONE-ACETAMINOPHEN 5-325 MG PO TABS: 2.0000 | ORAL_TABLET | ORAL | Status: DC | PRN

## 2012-12-30 MED ORDER — PENICILLIN V POTASSIUM 500 MG PO TABS: 500.0000 mg | ORAL_TABLET | Freq: Four times a day (QID) | ORAL | Status: AC

## 2012-12-30 MED ORDER — OXYCODONE-ACETAMINOPHEN 5-325 MG PO TABS
2.0000 | ORAL_TABLET | Freq: Once | ORAL | Status: AC
  Administered 2012-12-30: 2 via ORAL
  Filled 2012-12-30: qty 2

## 2012-12-30 NOTE — ED Notes (Signed)
Pt reports right upper toothache x 6-7 months, airway intact. No distress noted.

## 2012-12-30 NOTE — ED Provider Notes (Signed)
History     CSN: [id number]  Arrival date & time 12/30/12  0865   First MD Initiated Contact with Patient 12/30/12 1123      Chief Complaint  Patient presents with  . Dental Pain    (Consider location/radiation/quality/duration/timing/severity/associated sxs/prior treatment) Patient is a 32 y.o. female presenting with tooth pain. The history is provided by the patient.  Dental PainThe primary symptoms include mouth pain and headaches. Primary symptoms do not include dental injury, oral bleeding, oral lesions, fever, shortness of breath, sore throat, angioedema or cough. The symptoms began 3 to 5 days ago. The symptoms are worsening. The symptoms occur constantly.  Additional symptoms include: dental sensitivity to temperature, jaw pain and ear pain. Additional symptoms do not include: gum swelling, gum tenderness, purulent gums, trismus, facial swelling, trouble swallowing, pain with swallowing, excessive salivation, dry mouth, taste disturbance, smell disturbance, drooling, hearing loss, nosebleeds, swollen glands, goiter and fatigue. Medical issues include: smoking.     Jessica Case is a 32 year old female who presents with chief complaint of dental pain.  This has been a problem previously.  Patient has had that began and has been increasing in the tooth for the past 3 days. The Patient has a broken tooth in the posterior upper right molar. +HA/ Sinus pain and otalgia. The patient denies difficulty breathing or swallowing.  She denies change in voice, fevers, chills, or myalgias.  History reviewed. No pertinent past medical history.  History reviewed. No pertinent past surgical history.  History reviewed. No pertinent family history.  History  Substance Use Topics  . Smoking status: Current Every Day Smoker  . Smokeless tobacco: Not on file  . Alcohol Use: Yes     Comment: occ    OB History   Grav Para Term Preterm Abortions TAB SAB Ect Mult Living                   Review of Systems  Constitutional: Negative for fever, chills and fatigue.  HENT: Positive for ear pain. Negative for hearing loss, nosebleeds, sore throat, facial swelling, drooling and trouble swallowing.   Respiratory: Negative for cough and shortness of breath.   Cardiovascular: Negative for chest pain.  Gastrointestinal: Negative for nausea, vomiting, abdominal pain, diarrhea and constipation.  Genitourinary: Negative for dysuria and hematuria.  Musculoskeletal: Negative for myalgias and arthralgias.  Skin: Negative for rash.  Neurological: Positive for headaches. Negative for numbness.  All other systems reviewed and are negative.    Allergies  Strawberry  Home Medications   Current Outpatient Rx  Name  Route  Sig  Dispense  Refill  . acetaminophen (TYLENOL) 500 MG tablet   Oral   Take 1,000 mg by mouth every 6 (six) hours as needed for pain.         Marland Kitchen oxyCODONE-acetaminophen (PERCOCET) 5-325 MG per tablet   Oral   Take 2 tablets by mouth every 4 (four) hours as needed for pain.   20 tablet   0   . penicillin v potassium (VEETID) 500 MG tablet   Oral   Take 1 tablet (500 mg total) by mouth 4 (four) times daily.   40 tablet   0     BP 139/99  Pulse 71  Temp(Src) 98 F (36.7 C) (Oral)  Resp 71  SpO2 100%  LMP 12/21/2012  Physical Exam  Constitutional: She is oriented to person, place, and time. She appears well-developed and well-nourished. No distress.  HENT:  Head: Normocephalic and atraumatic. No trismus  in the jaw.  Mouth/Throat: She does not have dentures. No oral lesions. Normal dentition. Dental caries present. No dental abscesses, edematous or lacerations. No posterior oropharyngeal edema or posterior oropharyngeal erythema.    Eyes: Conjunctivae are normal. No scleral icterus.  Neck: Normal range of motion.  Cardiovascular: Normal rate, regular rhythm and normal heart sounds.  Exam reveals no gallop and no friction rub.   No murmur  heard. Pulmonary/Chest: Effort normal and breath sounds normal. No respiratory distress.  Abdominal: Soft. Bowel sounds are normal. She exhibits no distension and no mass. There is no tenderness. There is no guarding.  Neurological: She is alert and oriented to person, place, and time.  Skin: Skin is warm and dry. She is not diaphoretic.    ED Course  Procedures (including critical care time)  Labs Reviewed - No data to display No results found.   1. Dental implant pain, initial encounter       MDM  Patient with toothache.  No gross abscess.  Exam unconcerning for Ludwig's angina or spread of infection.  Will treat with penicillin and pain medicine.  Urged patient to follow-up with dentist.          Arthor Captain, PA-C 01/01/13 1801

## 2012-12-30 NOTE — ED Notes (Signed)
Right upper molar toothache 6 months ago, saw dentist & needed to have it extracted but wasn't able to due to not having insurance & finances. C/o toothache returned 3 days ago, OTC tylenol not helping

## 2013-01-02 ENCOUNTER — Telehealth (HOSPITAL_COMMUNITY): Payer: Self-pay | Admitting: Emergency Medicine

## 2013-01-02 NOTE — ED Provider Notes (Signed)
Medical screening examination/treatment/procedure(s) were performed by non-physician practitioner and as supervising physician I was immediately available for consultation/collaboration.   Michael Y. Ghim, MD 01/02/13 2330 

## 2013-06-13 ENCOUNTER — Encounter (HOSPITAL_COMMUNITY): Payer: Self-pay | Admitting: Emergency Medicine

## 2013-06-13 ENCOUNTER — Emergency Department (HOSPITAL_COMMUNITY)
Admission: EM | Admit: 2013-06-13 | Discharge: 2013-06-13 | Disposition: A | Discharge status: Home / Self Care | Payer: Self-pay | Attending: Emergency Medicine | Admitting: Emergency Medicine

## 2013-06-13 DIAGNOSIS — K0889 Other specified disorders of teeth and supporting structures: Secondary | ICD-10-CM

## 2013-06-13 DIAGNOSIS — K089 Disorder of teeth and supporting structures, unspecified: Secondary | ICD-10-CM | POA: Insufficient documentation

## 2013-06-13 DIAGNOSIS — F17200 Nicotine dependence, unspecified, uncomplicated: Secondary | ICD-10-CM | POA: Insufficient documentation

## 2013-06-13 MED ORDER — HYDROCODONE-ACETAMINOPHEN 5-325 MG PO TABS: 1.0000 | ORAL_TABLET | ORAL | Status: DC | PRN

## 2013-06-13 MED ORDER — PENICILLIN V POTASSIUM 500 MG PO TABS: 500.0000 mg | ORAL_TABLET | Freq: Three times a day (TID) | ORAL | Status: AC

## 2013-06-13 MED ORDER — HYDROCODONE-ACETAMINOPHEN 5-325 MG PO TABS
2.0000 | ORAL_TABLET | Freq: Once | ORAL | Status: AC
  Administered 2013-06-13: 2 via ORAL
  Filled 2013-06-13: qty 2

## 2013-06-13 NOTE — ED Notes (Signed)
PT. REPORTS RIGHT UPPER MOLAR PAIN WITH CAVITY FOR 2 WEEKS WORSE THIS EVENING .

## 2013-06-13 NOTE — ED Provider Notes (Signed)
CSN: [id number]     Arrival date & time 06/13/13  0349 History   First MD Initiated Contact with Patient 06/13/13 [phone]     Chief Complaint  Patient presents with  . Dental Pain   HPI  History provided by the patient. The patient is a 32 year old female presenting with complaints of right molar pain. Symptoms have been waxing and waning for the past 2 weeks or more. Symptoms do seem worse over the last recent days. She has been taking ibuprofen and 30 powders at home which helped some initially but no longer are helping. She denies any associated fever, chills or sweats. No swelling of the domes of the lip under the tongue. No difficulty breathing or swallowing. She denies any other aggravating or alleviating factors. No other associated symptoms.    History reviewed. No pertinent past medical history. History reviewed. No pertinent past surgical history. No family history on file. History  Substance Use Topics  . Smoking status: Current Every Day Smoker  . Smokeless tobacco: Not on file  . Alcohol Use: Yes     Comment: occ   OB History   Grav Para Term Preterm Abortions TAB SAB Ect Mult Living                 Review of Systems  Constitutional: Negative for fever and chills.  All other systems reviewed and are negative.    Allergies  Strawberry  Home Medications   Current Outpatient Rx  Name  Route  Sig  Dispense  Refill  . ibuprofen (ADVIL,MOTRIN) 800 MG tablet   Oral   Take 800 mg by mouth every 8 (eight) hours as needed for pain.          BP 132/91  Pulse 73  Temp(Src) 97.7 F (36.5 C) (Oral)  Resp 18  SpO2 99%  LMP 06/08/2013 Physical Exam  Nursing note and vitals reviewed. Constitutional: She is oriented to person, place, and time. She appears well-developed and well-nourished. No distress.  HENT:  Head: Normocephalic.  Mouth/Throat:    Complains of pain to the right lower molar tooth. No obvious signs of infection or other concerning issue.  Neck:  Normal range of motion. Neck supple.  Cardiovascular: Normal rate and regular rhythm.   Pulmonary/Chest: Effort normal and breath sounds normal.  Abdominal: Soft.  Musculoskeletal: Normal range of motion.  Lymphadenopathy:    She has no cervical adenopathy.  Neurological: She is alert and oriented to person, place, and time.  Skin: Skin is warm and dry. No rash noted.  Psychiatric: She has a normal mood and affect. Her behavior is normal.    ED Course  Procedures Labs Review Labs Reviewed - No data to display Imaging Review No results found.  MDM   1. Pain, dental      5:30AM patient seen and evaluated. She appears well no acute distress. Sleeping upon entering the room. She does not appear in significant pain or discomfort.    Angus Seller, PA-C 06/14/13 [phone]

## 2013-06-14 NOTE — ED Provider Notes (Signed)
Medical screening examination/treatment/procedure(s) were performed by non-physician practitioner and as supervising physician I was immediately available for consultation/collaboration.  Jordell Outten Smitty Cords, MD 06/14/13 0030

## 2013-08-30 ENCOUNTER — Emergency Department (HOSPITAL_COMMUNITY)
Admission: EM | Admit: 2013-08-30 | Discharge: 2013-08-30 | Discharge status: Against Medical Advice | Payer: Medicaid Other | Attending: Emergency Medicine | Admitting: Emergency Medicine

## 2013-08-30 ENCOUNTER — Encounter (HOSPITAL_COMMUNITY): Payer: Self-pay | Admitting: Emergency Medicine

## 2013-08-30 DIAGNOSIS — N898 Other specified noninflammatory disorders of vagina: Secondary | ICD-10-CM | POA: Insufficient documentation

## 2013-08-30 DIAGNOSIS — K089 Disorder of teeth and supporting structures, unspecified: Secondary | ICD-10-CM | POA: Insufficient documentation

## 2013-08-30 DIAGNOSIS — F17200 Nicotine dependence, unspecified, uncomplicated: Secondary | ICD-10-CM | POA: Insufficient documentation

## 2013-08-30 LAB — URINE MICROSCOPIC-ADD ON

## 2013-08-30 LAB — URINALYSIS, ROUTINE W REFLEX MICROSCOPIC
Nitrite: NEGATIVE
Protein, ur: NEGATIVE mg/dL
Specific Gravity, Urine: 1.027 (ref 1.005–1.030)
Urobilinogen, UA: 1 mg/dL (ref 0.0–1.0)

## 2013-08-30 LAB — POCT PREGNANCY, URINE: Preg Test, Ur: NEGATIVE

## 2013-08-30 NOTE — ED Notes (Signed)
Pt c/o right upper dental pain and white vaginal discharge x 3 days

## 2013-09-01 ENCOUNTER — Emergency Department (HOSPITAL_COMMUNITY)
Admission: EM | Admit: 2013-09-01 | Discharge: 2013-09-01 | Disposition: A | Discharge status: Home / Self Care | Payer: Medicaid Other | Attending: Emergency Medicine | Admitting: Emergency Medicine

## 2013-09-01 ENCOUNTER — Encounter (HOSPITAL_COMMUNITY): Payer: Self-pay | Admitting: Emergency Medicine

## 2013-09-01 DIAGNOSIS — K0889 Other specified disorders of teeth and supporting structures: Secondary | ICD-10-CM

## 2013-09-01 DIAGNOSIS — K006 Disturbances in tooth eruption: Secondary | ICD-10-CM | POA: Insufficient documentation

## 2013-09-01 DIAGNOSIS — F17200 Nicotine dependence, unspecified, uncomplicated: Secondary | ICD-10-CM | POA: Insufficient documentation

## 2013-09-01 DIAGNOSIS — K089 Disorder of teeth and supporting structures, unspecified: Secondary | ICD-10-CM | POA: Insufficient documentation

## 2013-09-01 MED ORDER — HYDROCODONE-ACETAMINOPHEN 5-325 MG PO TABS: 1.0000 | ORAL_TABLET | Freq: Four times a day (QID) | ORAL | Status: DC | PRN

## 2013-09-01 MED ORDER — FLUCONAZOLE 150 MG PO TABS: 150.0000 mg | ORAL_TABLET | Freq: Every day | ORAL | Status: DC

## 2013-09-01 MED ORDER — PENICILLIN V POTASSIUM 500 MG PO TABS: 500.0000 mg | ORAL_TABLET | Freq: Four times a day (QID) | ORAL | Status: AC

## 2013-09-01 NOTE — ED Notes (Signed)
Pt complains of right upper dental pain on right side of mouth x 5 days. Pt had tooth next to it removed 4 months ago. Denies N/V but reports she thought she had a fever a few days ago. Was seen here two days for same complaints but left before seen due to long wait time. Pt also reports current yeast infection.

## 2013-09-01 NOTE — ED Notes (Signed)
Pt verbalizes she will get her pain prescription of Vicodin filled when she leaves the ED today.

## 2013-09-01 NOTE — ED Notes (Signed)
Dr. Otter made aware of pts pain and request for pain medication. 

## 2013-09-01 NOTE — ED Provider Notes (Signed)
CSN: [id number]     Arrival date & time 09/01/13  0507 History   First MD Initiated Contact with Patient 09/01/13 [phone]     Chief Complaint  Patient presents with  . Dental Pain   (Consider location/radiation/quality/duration/timing/severity/associated sxs/prior Treatment) HPI Comments: Patient presents today with a chief complaint of right upper dental pain.  Pain has been present for the past 5 days and is gradually worsening.  Denies dental injury.  She has taken Ibuprofen and Aspirin for the pain with minimal relief.  She currently does not have a dentist.  Patient is a 32 y.o. female presenting with tooth pain. The history is provided by the patient.  Dental Pain Quality:  Throbbing Onset quality:  Gradual Timing:  Constant Progression:  Worsening Associated symptoms: no difficulty swallowing, no drooling, no facial swelling, no fever, no neck pain, no neck swelling and no trismus     History reviewed. No pertinent past medical history. Past Surgical History  Procedure Laterality Date  . Tooth extraction     No family history on file. History  Substance Use Topics  . Smoking status: Current Every Day Smoker  . Smokeless tobacco: Not on file  . Alcohol Use: Yes     Comment: occ   OB History   Grav Para Term Preterm Abortions TAB SAB Ect Mult Living                 Review of Systems  Constitutional: Negative for fever.  HENT: Positive for dental problem. Negative for drooling and facial swelling.   Musculoskeletal: Negative for neck pain.  All other systems reviewed and are negative.    Allergies  Strawberry  Home Medications   Current Outpatient Rx  Name  Route  Sig  Dispense  Refill  . aspirin 325 MG tablet   Oral   Take 325 mg by mouth once.         Marland Kitchen ibuprofen (ADVIL,MOTRIN) 800 MG tablet   Oral   Take 800 mg by mouth every 8 (eight) hours as needed for pain.          BP 122/79  Pulse 81  Temp(Src) 98.7 F (37.1 C) (Oral)  Resp 16  SpO2  100%  LMP 08/02/2013 Physical Exam  Nursing note and vitals reviewed. Constitutional: She is oriented to person, place, and time. She appears well-developed and well-nourished. No distress.  HENT:  Head: Normocephalic and atraumatic.  Mouth/Throat: Uvula is midline, oropharynx is clear and moist and mucous membranes are normal. No trismus in the jaw. Abnormal dentition. No dental abscesses or uvula swelling. No oropharyngeal exudate, posterior oropharyngeal edema, posterior oropharyngeal erythema or tonsillar abscesses.   Pt able to open and close mouth with out difficulty. Airway intact. Uvula midline. Mild gingival swelling with tenderness over affected area, but no fluctuance. No swelling or tenderness of submental and submandibular regions.  No sublingual tenderness or tongue elevation.  Eyes: Conjunctivae and EOM are normal.  Neck: Normal range of motion and full passive range of motion without pain. Neck supple.  Cardiovascular: Normal rate and regular rhythm.   Pulmonary/Chest: Effort normal and breath sounds normal. No respiratory distress. She has no wheezes.  Genitourinary:  Patient declined  Musculoskeletal: Normal range of motion.  Lymphadenopathy:       Head (right side): No submental, no submandibular, no tonsillar, no preauricular and no posterior auricular adenopathy present.       Head (left side): No submental, no submandibular, no tonsillar, no preauricular and no  posterior auricular adenopathy present.    She has no cervical adenopathy.  Neurological: She is alert and oriented to person, place, and time.  Skin: Skin is warm and dry. No rash noted. She is not diaphoretic.    ED Course  Procedures (including critical care time) Labs Review Labs Reviewed - No data to display Imaging Review No results found.  EKG Interpretation   None       MDM  No diagnosis found. Patient with toothache.  No gross abscess.  Exam unconcerning for Ludwig's angina or spread of  infection.  Will treat with penicillin and pain medicine.  Urged patient to follow-up with dentist.       Santiago Glad, PA-C 09/02/13 [phone]

## 2013-09-02 NOTE — ED Provider Notes (Signed)
Medical screening examination/treatment/procedure(s) were performed by non-physician practitioner and as supervising physician I was immediately available for consultation/collaboration.    Kiam Bransfield M Aja Bolander, MD 09/02/13 2114 

## 2013-11-16 ENCOUNTER — Ambulatory Visit: Payer: Self-pay

## 2013-11-28 ENCOUNTER — Ambulatory Visit: Payer: Medicaid Other | Attending: Internal Medicine

## 2014-04-03 ENCOUNTER — Encounter (HOSPITAL_COMMUNITY): Payer: Self-pay | Admitting: Emergency Medicine

## 2014-04-03 ENCOUNTER — Emergency Department (HOSPITAL_COMMUNITY)
Admission: EM | Admit: 2014-04-03 | Discharge: 2014-04-03 | Disposition: A | Discharge status: Home / Self Care | Payer: Self-pay

## 2014-04-03 ENCOUNTER — Emergency Department (HOSPITAL_COMMUNITY)
Admission: EM | Admit: 2014-04-03 | Discharge: 2014-04-03 | Disposition: A | Discharge status: Home / Self Care | Payer: Medicaid Other | Attending: Emergency Medicine | Admitting: Emergency Medicine

## 2014-04-03 DIAGNOSIS — M79609 Pain in unspecified limb: Secondary | ICD-10-CM | POA: Diagnosis present

## 2014-04-03 DIAGNOSIS — M21619 Bunion of unspecified foot: Secondary | ICD-10-CM | POA: Diagnosis not present

## 2014-04-03 DIAGNOSIS — F17200 Nicotine dependence, unspecified, uncomplicated: Secondary | ICD-10-CM | POA: Diagnosis not present

## 2014-04-03 DIAGNOSIS — Z7982 Long term (current) use of aspirin: Secondary | ICD-10-CM | POA: Diagnosis not present

## 2014-04-03 MED ORDER — IBUPROFEN 800 MG PO TABS: 800.0000 mg | ORAL_TABLET | Freq: Three times a day (TID) | ORAL | Status: DC | PRN

## 2014-04-03 MED ORDER — SULFAMETHOXAZOLE-TRIMETHOPRIM 800-160 MG PO TABS: 1.0000 | ORAL_TABLET | Freq: Two times a day (BID) | ORAL | Status: DC

## 2014-04-03 NOTE — Discharge Instructions (Signed)
Bunion You have a bunion deformity of the feet. This is more common in women. It tends to be an inherited problem. Symptoms can include pain, swelling, and deformity around the great toe. Numbness and tingling may also be present. Your symptoms are often worsened by wearing shoes that cause pressure on the bunion. Changing the type of shoes you wear helps reduce symptoms. A wide shoe decreases pressure on the bunion. An arch support may be used if you have flat feet. Avoid shoes with heels higher than two inches. This puts more pressure on the bunion. X-rays may be helpful in evaluating the severity of the problem. Other foot problems often seen with bunions include corns, calluses, and hammer toes. If the deformity or pain is severe, surgical treatment may be necessary. Keep off your painful foot as much as possible until the pain is relieved. Call your caregiver if your symptoms are worse.  SEEK IMMEDIATE MEDICAL CARE IF:  You have increased redness, pain, swelling, or other symptoms of infection. Document Released: 10/05/2005 Document Revised: 12/28/2011 Document Reviewed: 04/04/2007 [facility] Patient Information 2014 [place].  Bunion Care A bunion is a boney protrusion at the base of your big toe (metatarsal-phalangeal joint). This problem, if painful or troublesome can be corrected with surgery. This is an elective surgery, so you can pick a convenient time for the procedure. The surgery may:  Improve appearance (cosmetic).  Relieve pain.  Improve function. Your foot is made up of a complex set of twenty-six bones which are held together by tough fibrous ligaments. The movement of the foot is controlled by muscles in the foot and leg. These muscles attach to the foot by cord like structures (tendons) that attach muscle to bone. If surgery is recommended, your caregiver will explain your foot problem and how surgery can improve it. Your caregiver can answer questions you may have about  the potential risks and complications involved. After determining that foot surgery is necessary to correct your problem, you can proceed with plans for the surgery. LET YOUR CAREGIVER KNOW ABOUT:   Previous problems with anesthetics or medicines used to numb the skin.  Allergies to dyes, iodine, foods, and/or latex.  Medicines taken including herbs, eye drops, prescription medicines (especially medicines used to "thin the blood"), aspirin and other over-the-counter medicines, and steroids (by mouth or as a cream).  History of bleeding or blood problems.  Possibility of pregnancy, if this applies.  History of blood clots in your legs and/or lungs.  Previous surgery.  Other important health problems. Let your caregiver know about health changes prior to surgery. BEFORE THE PROCEDURE  You should be present 60 minutes prior to your procedure or as directed.  PROCEDURE BUNION TYPES AND THEIR TREATMENTS  Positional Bunion. A positional bunion develops when a bony growth on the side of the metatarsal bone enlarges the joint. The metatarsal forces the joint capsule to stretch over it. As this growth pushes the big toe toward the others, the tendons on the inside tighten. This forces the big toe farther out of alignment. The bunion presses against the shoe, irritating the skin and causes further pain and disability.  Positional Bunionectomy Treatment. The bunion is removed. Tight tendons may be released.  Follow-up Care. Your toe is apt to be stiff at first but will loosen up as you move it. You may need to wear a special surgical shoe and, possibly, a splint for about three weeks.  Mild Structural Bunion. Structural bunions occur when the angle  between the first and second metatarsal bones increases to a point where it is greater than normal. The increased angle of the metatarsals makes the big toe slant toward the other toes. Sometimes bony growths may form. Irritation and swelling often  follow.  Structural Bunionectomy Treatment. Your caregiver surgically repositions the bone by decreasing the angle and may use a fixation device to hold it together. The bunion (bump) is also removed.  Degenerative Joint Disease (Arthritis). When wear-and-tear arthritis (osteoarthritis) of aging affects the big toe joint, pain and reduced joint motion may result. This is not a true bunion but may be associated with bunions. Left untreated, it can increase wear and tear in the joint and break down the cartilage. Pain and stiffness are problems of both wear-and-tear arthritis and rheumatoid arthritis.  Arthroplasty With Joint Implantation As Treatment. The bunion is first removed; then the degenerated joint is removed and replaced with an implant. AFTER THE PROCEDURE  After surgery your bone will heal in phases. A callous (new bone formation) forms, bridging the damaged bone allowing it to heal. In about 6 months, the bone is back to normal strength along with a return of nearly normal function. It is best to do elective surgeries when your health is optimal.  After surgery, you will be taken to the recovery area where a nurse will watch and check your progress. Once you are awake, stable, and taking fluids well, barring other problems you will be allowed to go home. HOME CARE INSTRUCTIONS  Be sure to ask your caregiver how long you will be off your feet and home from work. Plan accordingly. There are several types of bunions and varying surgical treatments for each. Common types are explained above. Your surgery may be similar and may include a fixation device (such as a small screw). Your foot and ankle may be immobilized by a cast (from your toes to below your knee). You may be asked not to bear weight on this foot for a few weeks or until comfortable. Once home, an ice pack applied to your operative site may help with discomfort and keep the swelling down. You may be able to walk a day or two after  surgery. Your podiatrist may prescribe a splint or a special shoe to be worn for several weeks. Only take over-the-counter or prescription medicines for pain, discomfort, or fever as directed by your caregiver.  SEEK MEDICAL CARE IF:   There is increased bleeding (more than a small spot) from the surgical site.  You notice redness, swelling, or increasing pain in the surgical site.  Pus is coming from the site.  An unexplained oral temperature above 102 F (38.9 C) develops.  You notice a foul smell coming from the surgical site or dressing. SEEK IMMEDIATE MEDICAL CARE IF:  You develop a rash, have difficulty breathing, or have any allergic problems with medications. Document Released: 10/02/2000 Document Revised: 12/28/2011 Document Reviewed: 10/08/2008 [facility] Patient Information 2014 [place], [place].

## 2014-04-03 NOTE — ED Notes (Signed)
Patient states she has intermittent great toe pain to the right foot x 6-7 months. States pain suddenly worsened last week. Patient has not seen a podiatrist, denies ever being seen for this problem. Patient has been taking asa, tylenol, and using epsom salt soaks. States pain worsened with pressure applied to the foot.

## 2014-04-03 NOTE — ED Provider Notes (Signed)
CSN: [id number]     Arrival date & time 04/03/14  0615 History   First MD Initiated Contact with Patient 04/03/14 0701     Chief Complaint  Patient presents with  . Toe Pain    Right great toe, bunion     (Consider location/radiation/quality/duration/timing/severity/associated sxs/prior Treatment) HPI Comments: Patient presents to the emergency department for evaluation of pain in her right foot. Patient reports that symptoms have been ongoing for 6 or 7 months. She has been experiencing increasing pain over this period of time. She is now experiencing severe pain with putting pressure at the base of the great toe or trying to walk.  Patient is a 33 y.o. female presenting with toe pain.  Toe Pain    History reviewed. No pertinent past medical history. Past Surgical History  Procedure Laterality Date  . Tooth extraction     No family history on file. History  Substance Use Topics  . Smoking status: Current Every Day Smoker -- 0.20 packs/day    Types: Cigarettes  . Smokeless tobacco: Never Used  . Alcohol Use: Yes     Comment: occ   OB History   Grav Para Term Preterm Abortions TAB SAB Ect Mult Living                 Review of Systems  Musculoskeletal: Positive for arthralgias.  All other systems reviewed and are negative.     Allergies  Strawberry  Home Medications   Prior to Admission medications   Medication Sig Start Date End Date Taking? Authorizing Provider  acetaminophen (TYLENOL) 500 MG tablet Take 1,000 mg by mouth every 6 (six) hours as needed for mild pain.   Yes Historical Provider, MD  aspirin 325 MG tablet Take 650 mg by mouth every 6 (six) hours as needed for mild pain.    Yes Historical Provider, MD  RisperiDONE (RISPERDAL PO) Take 1 tablet by mouth at bedtime.   Yes Historical Provider, MD  TRAZODONE HCL PO Take 1 tablet by mouth at bedtime.   Yes Historical Provider, MD  ibuprofen (ADVIL,MOTRIN) 800 MG tablet Take 1 tablet (800 mg total) by mouth  3 (three) times daily as needed. 04/03/14   Orpah Greek, MD  sulfamethoxazole-trimethoprim (SEPTRA DS) 800-160 MG per tablet Take 1 tablet by mouth every 12 (twelve) hours. 04/03/14   Orpah Greek, MD   BP 124/85  Pulse 73  Temp(Src) 97.5 F (36.4 C) (Oral)  Resp 16  SpO2 100%  LMP 03/20/2014 Physical Exam  Constitutional: She is oriented to person, place, and time.  Musculoskeletal:       Feet:  Neurological: She is alert and oriented to person, place, and time. She has normal reflexes.  Skin: Skin is warm and dry. No rash noted. No erythema.    ED Course  Procedures (including critical care time) Labs Review Labs Reviewed - No data to display  Imaging Review No results found.   EKG Interpretation None      MDM   Final diagnoses:  Bunion of great toe   Patient with bilateral bunions presents with pain at the first MTP of the right foot. She has a tender, ballotable lesion overlying the MTP joint. This has been present for months. It is not erythematous or warm to touch, do not suspect a septic joint. Likewise, it is unlikely that this is an abscess. It does appear to be cystic in nature and is likely causing some of the patient's pain, although the  bunion is likely the ultimate causative agent.   The patient will be placed on Bactrim because of the joints, cannot rule out early infection. Anti-inflammatory agents for pain. Rest, elevate the foot, the restrictive shoes. Will refer to orthopedics for further evaluation. I do not have a podiatrist on call, patient was counseled that she could also follow up with a podiatrist. Return if area becomes warm to touch, red, significantly increasing swelling or fever.    Orpah Greek, MD 04/03/14 [phone]

## 2014-06-05 ENCOUNTER — Ambulatory Visit: Payer: Self-pay

## 2014-06-30 ENCOUNTER — Emergency Department (HOSPITAL_COMMUNITY): Payer: Medicaid Other

## 2014-06-30 ENCOUNTER — Encounter (HOSPITAL_COMMUNITY): Payer: Self-pay | Admitting: Emergency Medicine

## 2014-06-30 ENCOUNTER — Inpatient Hospital Stay (HOSPITAL_COMMUNITY)
Admission: EM | Admit: 2014-06-30 | Discharge: 2014-07-03 | DRG: 759 | Disposition: A | Discharge status: Home / Self Care | Payer: Medicaid Other | Attending: Obstetrics & Gynecology | Admitting: Obstetrics & Gynecology

## 2014-06-30 DIAGNOSIS — F17200 Nicotine dependence, unspecified, uncomplicated: Secondary | ICD-10-CM | POA: Diagnosis present

## 2014-06-30 DIAGNOSIS — D72829 Elevated white blood cell count, unspecified: Secondary | ICD-10-CM | POA: Diagnosis present

## 2014-06-30 DIAGNOSIS — N7093 Salpingitis and oophoritis, unspecified: Principal | ICD-10-CM | POA: Diagnosis present

## 2014-06-30 LAB — WET PREP, GENITAL
TRICH WET PREP: NONE SEEN
YEAST WET PREP: NONE SEEN

## 2014-06-30 LAB — CBC WITH DIFFERENTIAL/PLATELET
BASOS PCT: 0 % (ref 0–1)
Basophils Absolute: 0 10*3/uL (ref 0.0–0.1)
Eosinophils Absolute: 0.2 10*3/uL (ref 0.0–0.7)
Eosinophils Relative: 1 % (ref 0–5)
HCT: 39.2 % (ref 36.0–46.0)
HEMOGLOBIN: 13.4 g/dL (ref 12.0–15.0)
LYMPHS ABS: 1.5 10*3/uL (ref 0.7–4.0)
LYMPHS PCT: 9 % — AB (ref 12–46)
MCH: 30.4 pg (ref 26.0–34.0)
MCHC: 34.2 g/dL (ref 30.0–36.0)
MCV: 88.9 fL (ref 78.0–100.0)
MONOS PCT: 8 % (ref 3–12)
Monocytes Absolute: 1.4 10*3/uL — ABNORMAL HIGH (ref 0.1–1.0)
NEUTROS ABS: 13.3 10*3/uL — AB (ref 1.7–7.7)
NEUTROS PCT: 82 % — AB (ref 43–77)
Platelets: 304 10*3/uL (ref 150–400)
RBC: 4.41 MIL/uL (ref 3.87–5.11)
RDW: 13.8 % (ref 11.5–15.5)
WBC: 16.4 10*3/uL — AB (ref 4.0–10.5)

## 2014-06-30 LAB — URINALYSIS, ROUTINE W REFLEX MICROSCOPIC
BILIRUBIN URINE: NEGATIVE
GLUCOSE, UA: NEGATIVE mg/dL
Hgb urine dipstick: NEGATIVE
KETONES UR: 40 mg/dL — AB
Nitrite: NEGATIVE
PH: 5.5 (ref 5.0–8.0)
Protein, ur: NEGATIVE mg/dL
Specific Gravity, Urine: 1.011 (ref 1.005–1.030)
Urobilinogen, UA: 0.2 mg/dL (ref 0.0–1.0)

## 2014-06-30 LAB — COMPREHENSIVE METABOLIC PANEL
ALBUMIN: 3.5 g/dL (ref 3.5–5.2)
ALK PHOS: 85 U/L (ref 39–117)
ALT: 19 U/L (ref 0–35)
ANION GAP: 15 (ref 5–15)
AST: 23 U/L (ref 0–37)
BILIRUBIN TOTAL: 0.9 mg/dL (ref 0.3–1.2)
BUN: 10 mg/dL (ref 6–23)
CHLORIDE: 102 meq/L (ref 96–112)
CO2: 21 mEq/L (ref 19–32)
Calcium: 9.2 mg/dL (ref 8.4–10.5)
Creatinine, Ser: 0.95 mg/dL (ref 0.50–1.10)
GFR calc Af Amer: >90 mL/min (ref >90)
GFR calc non Af Amer: 78 mL/min — ABNORMAL LOW (ref >90)
Glucose, Bld: 96 mg/dL (ref 70–99)
POTASSIUM: 4.3 meq/L (ref 3.7–5.3)
Sodium: 138 mEq/L (ref 137–147)
Total Protein: 6.6 g/dL (ref 6.0–8.3)

## 2014-06-30 LAB — HIV ANTIBODY (ROUTINE TESTING W REFLEX): HIV 1&2 Ab, 4th Generation: NONREACTIVE

## 2014-06-30 LAB — LACTIC ACID, PLASMA: Lactic Acid, Venous: 1 mmol/L (ref 0.5–2.2)

## 2014-06-30 LAB — URINE MICROSCOPIC-ADD ON

## 2014-06-30 LAB — PREGNANCY, URINE: PREG TEST UR: NEGATIVE

## 2014-06-30 MED ORDER — SODIUM CHLORIDE 0.9 % IV BOLUS (SEPSIS)
1000.0000 mL | Freq: Once | INTRAVENOUS | Status: AC
  Administered 2014-06-30: 1000 mL via INTRAVENOUS

## 2014-06-30 MED ORDER — HYDROMORPHONE HCL 2 MG PO TABS
2.0000 mg | ORAL_TABLET | ORAL | Status: DC | PRN
  Administered 2014-06-30 – 2014-07-03 (×15): 4 mg via ORAL
  Filled 2014-06-30 (×15): qty 2

## 2014-06-30 MED ORDER — ACETAMINOPHEN 500 MG PO TABS: 1000.0000 mg | ORAL_TABLET | Freq: Four times a day (QID) | ORAL | Status: DC | PRN

## 2014-06-30 MED ORDER — IOHEXOL 300 MG/ML  SOLN: 100.0000 mL | Freq: Once | INTRAMUSCULAR | Status: DC | PRN

## 2014-06-30 MED ORDER — MORPHINE SULFATE 4 MG/ML IJ SOLN
6.0000 mg | Freq: Once | INTRAMUSCULAR | Status: AC
  Administered 2014-06-30: 6 mg via INTRAVENOUS
  Filled 2014-06-30: qty 2

## 2014-06-30 MED ORDER — PNEUMOCOCCAL VAC POLYVALENT 25 MCG/0.5ML IJ INJ
0.5000 mL | INJECTION | INTRAMUSCULAR | Status: AC
  Administered 2014-07-01: 0.5 mL via INTRAMUSCULAR
  Filled 2014-06-30: qty 0.5

## 2014-06-30 MED ORDER — LACTATED RINGERS IV SOLN
INTRAVENOUS | Status: DC
  Administered 2014-06-30 – 2014-07-01 (×2): via INTRAVENOUS

## 2014-06-30 MED ORDER — ALUM & MAG HYDROXIDE-SIMETH 200-200-20 MG/5ML PO SUSP: 30.0000 mL | ORAL | Status: DC | PRN

## 2014-06-30 MED ORDER — ONDANSETRON HCL 4 MG/2ML IJ SOLN
4.0000 mg | Freq: Once | INTRAMUSCULAR | Status: AC
  Administered 2014-06-30: 4 mg via INTRAVENOUS
  Filled 2014-06-30: qty 2

## 2014-06-30 MED ORDER — PRENATAL MULTIVITAMIN CH
1.0000 | ORAL_TABLET | Freq: Every day | ORAL | Status: DC
  Filled 2014-06-30: qty 1

## 2014-06-30 MED ORDER — ONDANSETRON HCL 4 MG/2ML IJ SOLN: 4.0000 mg | Freq: Four times a day (QID) | INTRAMUSCULAR | Status: DC | PRN

## 2014-06-30 MED ORDER — DOCUSATE SODIUM 100 MG PO CAPS
100.0000 mg | ORAL_CAPSULE | Freq: Two times a day (BID) | ORAL | Status: DC | PRN
  Administered 2014-07-01: 100 mg via ORAL
  Filled 2014-06-30: qty 1

## 2014-06-30 MED ORDER — INFLUENZA VAC SPLIT QUAD 0.5 ML IM SUSY
0.5000 mL | PREFILLED_SYRINGE | INTRAMUSCULAR | Status: AC
  Administered 2014-07-01: 0.5 mL via INTRAMUSCULAR
  Filled 2014-06-30: qty 0.5

## 2014-06-30 MED ORDER — IBUPROFEN 800 MG PO TABS
800.0000 mg | ORAL_TABLET | Freq: Three times a day (TID) | ORAL | Status: DC | PRN
  Administered 2014-06-30 – 2014-07-03 (×6): 800 mg via ORAL
  Filled 2014-06-30 (×7): qty 1

## 2014-06-30 MED ORDER — HYDROMORPHONE HCL PF 1 MG/ML IJ SOLN: 0.2000 mg | INTRAMUSCULAR | Status: DC | PRN

## 2014-06-30 MED ORDER — OXYCODONE-ACETAMINOPHEN 5-325 MG PO TABS
1.0000 | ORAL_TABLET | ORAL | Status: DC | PRN
  Administered 2014-06-30 (×2): 2 via ORAL
  Filled 2014-06-30 (×2): qty 2

## 2014-06-30 MED ORDER — DOXYCYCLINE HYCLATE 100 MG IV SOLR: 100.0000 mg | Freq: Two times a day (BID) | INTRAVENOUS | Status: DC

## 2014-06-30 MED ORDER — DEXTROSE 5 % IV SOLN
100.0000 mg | Freq: Once | INTRAVENOUS | Status: AC
  Administered 2014-06-30: 100 mg via INTRAVENOUS
  Filled 2014-06-30: qty 100

## 2014-06-30 MED ORDER — DOXYCYCLINE HYCLATE 100 MG PO TABS
100.0000 mg | ORAL_TABLET | Freq: Two times a day (BID) | ORAL | Status: DC
  Administered 2014-06-30 – 2014-07-03 (×7): 100 mg via ORAL
  Filled 2014-06-30 (×8): qty 1

## 2014-06-30 MED ORDER — DIPHENHYDRAMINE HCL 25 MG PO CAPS
25.0000 mg | ORAL_CAPSULE | Freq: Four times a day (QID) | ORAL | Status: DC | PRN
  Administered 2014-06-30 – 2014-07-01 (×4): 25 mg via ORAL
  Filled 2014-06-30 (×4): qty 1

## 2014-06-30 MED ORDER — ONDANSETRON HCL 4 MG PO TABS
4.0000 mg | ORAL_TABLET | Freq: Four times a day (QID) | ORAL | Status: DC | PRN
  Administered 2014-07-02: 4 mg via ORAL
  Filled 2014-06-30: qty 1

## 2014-06-30 MED ORDER — ONDANSETRON 4 MG PO TBDP
8.0000 mg | ORAL_TABLET | Freq: Once | ORAL | Status: AC
  Administered 2014-06-30: 8 mg via ORAL
  Filled 2014-06-30: qty 2

## 2014-06-30 MED ORDER — IOHEXOL 300 MG/ML  SOLN
25.0000 mL | Freq: Once | INTRAMUSCULAR | Status: AC | PRN
  Administered 2014-06-30: 25 mL via ORAL

## 2014-06-30 MED ORDER — DEXTROSE 5 % IV SOLN
2.0000 g | Freq: Once | INTRAVENOUS | Status: AC
  Administered 2014-06-30: 2 g via INTRAVENOUS
  Filled 2014-06-30: qty 2

## 2014-06-30 MED ORDER — ZOLPIDEM TARTRATE 5 MG PO TABS
5.0000 mg | ORAL_TABLET | Freq: Every evening | ORAL | Status: DC | PRN
  Administered 2014-06-30 – 2014-07-02 (×3): 5 mg via ORAL
  Filled 2014-06-30 (×3): qty 1

## 2014-06-30 MED ORDER — DEXTROSE 5 % IV SOLN
2.0000 g | Freq: Four times a day (QID) | INTRAVENOUS | Status: DC
  Administered 2014-06-30 – 2014-07-03 (×13): 2 g via INTRAVENOUS
  Filled 2014-06-30 (×15): qty 2

## 2014-06-30 MED ORDER — SIMETHICONE 80 MG PO CHEW: 80.0000 mg | CHEWABLE_TABLET | Freq: Four times a day (QID) | ORAL | Status: DC | PRN

## 2014-06-30 NOTE — H&P (Signed)
Gynecology History & Physical  Jessica Case is a 33 y.o. G0 who was transferred from [facility] ED for admission here at [facility] for left TOA.  She initially presented with left lower abdominal pain and one episode of emesis.  Also reported dysuria, UA was negative, culture pending.  The pain had been constant for the past 2 days. She denies any fevers or recent infections during the interval there is no chest pain or shortness of breath.    Denies any STIs   Pertinent Gynecological History: Menses: regular every month approximately 3 days per month Contraception: none, does not mind getting pregnant Blood transfusions: none Sexually transmitted diseases: denies Previous GYN Procedures: none  Last pap: cannot remember OB History: G0   Menstrual History: Regular menses Patient's last menstrual period was 06/20/2014.   History reviewed. No pertinent past medical history.  Past Surgical History  Procedure Laterality Date  . Tooth extraction     Family History: No family history on file.  Social History:  reports that she has been smoking Cigarettes.  She has been smoking about 0.10 packs per day. She has never used smokeless tobacco. She reports that she drinks alcohol. She reports that she does not use illicit drugs.   Allergies  Allergen Reactions  . Strawberry Hives   Prescriptions prior to admission  Medication Sig Dispense Refill  . ibuprofen (ADVIL,MOTRIN) 800 MG tablet Take 1 tablet (800 mg total) by mouth 3 (three) times daily as needed.  30 tablet  0   ROS  Negative except for what is mentioned in the HPI.  Blood pressure 120/57, pulse 86, temperature 98.5 F (36.9 C), temperature source Oral, resp. rate 16, height 5\' 1"  (1.549 m), weight 159 lb (72.122 kg), last menstrual period 06/20/2014, SpO2 100.00%. Physical Exam  Constitutional: She is oriented to person, place, and time. She appears well-developed.  HENT:  Head: Normocephalic and atraumatic.  Eyes: Pupils are  equal, round, and reactive to light.  Neck: Normal range of motion. Neck supple.  Cardiovascular: Normal rate, regular rhythm and normal heart sounds.   Respiratory: Effort normal and breath sounds normal.  GI: Soft. Bowel sounds are normal. There is tenderness. There is no rebound and no guarding.  LLQ and lower abdominal tenderness  Musculoskeletal: Normal range of motion.  Neurological: She is alert and oriented to person, place, and time.   Results for orders placed during the hospital encounter of 06/30/14 (from the past 24 hour(s))  CBC WITH DIFFERENTIAL     Status: Abnormal   Collection Time    06/30/14  1:11 AM      Result Value Ref Range   WBC 16.4 (*) 4.0 - 10.5 K/uL   RBC 4.41  3.87 - 5.11 MIL/uL   Hemoglobin 13.4  12.0 - 15.0 g/dL   HCT 39.2  36.0 - 46.0 %   MCV 88.9  78.0 - 100.0 fL   MCH 30.4  26.0 - 34.0 pg   MCHC 34.2  30.0 - 36.0 g/dL   RDW 13.8  11.5 - 15.5 %   Platelets 304  150 - 400 K/uL   Neutrophils Relative % 82 (*) 43 - 77 %   Neutro Abs 13.3 (*) 1.7 - 7.7 K/uL   Lymphocytes Relative 9 (*) 12 - 46 %   Lymphs Abs 1.5  0.7 - 4.0 K/uL   Monocytes Relative 8  3 - 12 %   Monocytes Absolute 1.4 (*) 0.1 - 1.0 K/uL   Eosinophils Relative 1  0 - 5 %   Eosinophils Absolute 0.2  0.0 - 0.7 K/uL   Basophils Relative 0  0 - 1 %   Basophils Absolute 0.0  0.0 - 0.1 K/uL  COMPREHENSIVE METABOLIC PANEL     Status: Abnormal   Collection Time    06/30/14  1:11 AM      Result Value Ref Range   Sodium 138  137 - 147 mEq/L   Potassium 4.3  3.7 - 5.3 mEq/L   Chloride 102  96 - 112 mEq/L   CO2 21  19 - 32 mEq/L   Glucose, Bld 96  70 - 99 mg/dL   BUN 10  6 - 23 mg/dL   Creatinine, Ser 0.95  0.50 - 1.10 mg/dL   Calcium 9.2  8.4 - 10.5 mg/dL   Total Protein 6.6  6.0 - 8.3 g/dL   Albumin 3.5  3.5 - 5.2 g/dL   AST 23  0 - 37 U/L   ALT 19  0 - 35 U/L   Alkaline Phosphatase 85  39 - 117 U/L   Total Bilirubin 0.9  0.3 - 1.2 mg/dL   GFR calc non Af Amer 78 (*) >90 mL/min    GFR calc Af Amer >90  >90 mL/min   Anion gap 15  5 - 15  LACTIC ACID, PLASMA     Status: None   Collection Time    06/30/14  2:00 AM      Result Value Ref Range   Lactic Acid, Venous 1.0  0.5 - 2.2 mmol/L  URINALYSIS, ROUTINE W REFLEX MICROSCOPIC     Status: Abnormal   Collection Time    06/30/14  2:40 AM      Result Value Ref Range   Color, Urine YELLOW  YELLOW   APPearance CLOUDY (*) CLEAR   Specific Gravity, Urine 1.011  1.005 - 1.030   pH 5.5  5.0 - 8.0   Glucose, UA NEGATIVE  NEGATIVE mg/dL   Hgb urine dipstick NEGATIVE  NEGATIVE   Bilirubin Urine NEGATIVE  NEGATIVE   Ketones, ur 40 (*) NEGATIVE mg/dL   Protein, ur NEGATIVE  NEGATIVE mg/dL   Urobilinogen, UA 0.2  0.0 - 1.0 mg/dL   Nitrite NEGATIVE  NEGATIVE   Leukocytes, UA SMALL (*) NEGATIVE  PREGNANCY, URINE     Status: None   Collection Time    06/30/14  2:40 AM      Result Value Ref Range   Preg Test, Ur NEGATIVE  NEGATIVE  URINE MICROSCOPIC-ADD ON     Status: Abnormal   Collection Time    06/30/14  2:40 AM      Result Value Ref Range   Squamous Epithelial / LPF FEW (*) RARE   WBC, UA 7-10  <3 WBC/hpf   Bacteria, UA FEW (*) RARE  WET PREP, GENITAL     Status: Abnormal   Collection Time    06/30/14  4:48 AM      Result Value Ref Range   Yeast Wet Prep HPF POC NONE SEEN  NONE SEEN   Trich, Wet Prep NONE SEEN  NONE SEEN   Clue Cells Wet Prep HPF POC FEW (*) NONE SEEN   WBC, Wet Prep HPF POC FEW (*) NONE SEEN    06/30/2014   TRANSABDOMINAL AND TRANSVAGINAL ULTRASOUND OF PELVIS  CLINICAL DATA:  Evaluate for tubo-ovarian abscess, given concern on CT.   TECHNIQUE: Both transabdominal and transvaginal ultrasound examinations of the pelvis were performed. Transabdominal technique was performed  for global imaging of the pelvis including uterus, ovaries, adnexal regions, and pelvic cul-de-sac. It was necessary to proceed with endovaginal exam following the transabdominal exam to visualize the uterus and ovaries in greater  detail.  COMPARISON:  CT of the abdomen and pelvis performed earlier today at 3:26 a.m.  FINDINGS: Uterus  Measurements: 8.1 x 3.2 x 4.9 cm. A 2.6 x 2.0 x 1.8 cm fibroid is noted on the right side, and a 4.0 x 3.9 x 4.0 cm fibroid is noted on the left side.  Endometrium  Thickness: 0.9 cm.  No focal abnormality visualized.  Right ovary  Measurements: 3.8 x 2.5 x 2.1 cm. Normal appearance/no adnexal mass.  Left ovary  Measurements: 3.8 x 2.7 x 2.1 cm. There is a complex fluid-filled structure at the left adnexa, with mild internal echoes and associated nodularity. This appears to reflect the left-sided fallopian tube, with underlying wall thickening. Depending on clinical findings, this may reflect a hydrosalpinx or pyosalpinx.  Other findings  A small amount of free fluid is noted within the pelvic cul-de-sac.  IMPRESSION: 1. Complex fluid-filled structure at the left adnexa, with mild internal echoes and associated soft tissue nodularity. This appears to reflect the left-sided fallopian tube, with underlying wall thickening. Depending on clinical findings, this may reflect a hydrosalpinx or pyosalpinx. 2. The left ovary is otherwise unremarkable in appearance. No definite evidence of tubo-ovarian abscess. 3. Uterine fibroids noted, measuring up to 4.0 cm.   Electronically Signed   By: Garald Balding M.D.   On: 06/30/2014 06:29    06/30/2014   CT ABDOMEN AND PELVIS WITH CONTRAST CLINICAL DATA:  Abdominal pain. Tenderness to palpation at both lower quadrants. Nausea and vomiting.  TECHNIQUE: Multidetector CT imaging of the abdomen and pelvis was performed using the standard protocol following bolus administration of intravenous contrast.  CONTRAST:  100 mL of Omnipaque 300 IV contrast  COMPARISON:  Abdominal radiograph performed 05/15/2011  FINDINGS: Minimal bibasilar atelectasis or scarring is noted.  The liver and spleen are unremarkable in appearance. The gallbladder is within normal limits. The pancreas and  adrenal glands are unremarkable.  The kidneys are unremarkable in appearance. There is no evidence of hydronephrosis. No renal or ureteral stones are seen. No perinephric stranding is appreciated.  Trace free fluid within the pelvis is likely physiologic in nature. The small bowel is unremarkable in appearance. The stomach is within normal limits. No acute vascular abnormalities are seen.  The appendix is normal in caliber, without evidence for appendicitis. The colon is unremarkable in appearance.  The bladder is mildly distended and grossly unremarkable. The uterus is grossly unremarkable. A fluid-filled tubular structure is noted at the left adnexa, which may reflect hydrosalpinx or pyosalpinx. The left ovary is difficult to fully assess; a tubo-ovarian abscess cannot be excluded. No inguinal lymphadenopathy is seen.  No acute osseous abnormalities are identified.  IMPRESSION: Fluid-filled tubular structure at the left adnexa may reflect hydrosalpinx or pyosalpinx. The left ovary is not well characterized; a tubo-ovarian abscess cannot be excluded. Would correlate for associated symptoms and signs of infection.   Electronically Signed   By: Garald Balding M.D.   On: 06/30/2014 03:51    Assessment/Plan:  Left TOA/PID Inpatient admission  Antibiotics ordered: IV Cefoxitin and oral Doxycycline, will monitor response Pain medications ordered prn Routine gynecologic care   [place], MD 06/30/2014, 9:35 AM

## 2014-06-30 NOTE — ED Provider Notes (Signed)
CSN: [id number]     Arrival date & time 06/30/14  0100 History   First MD Initiated Contact with Patient 06/30/14 0138     Chief Complaint  Patient presents with  . Abdominal Pain  . Nausea     (Consider location/radiation/quality/duration/timing/severity/associated sxs/prior Treatment) HPI Jessica Case is a 33 y.o. female with no significant past medical history coming in with abdominal pain. Patient states it is in her left lower quadrant with radiation throughout her bilateral lower quadrants. She describes as a tightening twisting sensation. It is worse with moving coughing or laughing. She tried to take Motrin without any relief. She had one episode of emesis in the emergency department. She has associated dysuria, she denies any vaginal bleeding or vaginal discharge. The pain has been constant for the past 2 days. She denies any fevers or recent infections during the interval there is no chest pain or shortness of breath. She denies ever having belly pain like this in the past.  10 Systems reviewed and are negative for acute change except as noted in the HPI.     History reviewed. No pertinent past medical history. Past Surgical History  Procedure Laterality Date  . Tooth extraction     No family history on file. History  Substance Use Topics  . Smoking status: Current Every Day Smoker -- 0.10 packs/day    Types: Cigarettes  . Smokeless tobacco: Never Used  . Alcohol Use: Yes     Comment: occ   OB History   Grav Para Term Preterm Abortions TAB SAB Ect Mult Living                 Review of Systems    Allergies  Strawberry  Home Medications   Prior to Admission medications   Medication Sig Start Date End Date Taking? Authorizing Provider  ibuprofen (ADVIL,MOTRIN) 800 MG tablet Take 1 tablet (800 mg total) by mouth 3 (three) times daily as needed. 04/03/14  Yes Orpah Greek, MD   BP 136/86  Pulse 71  Temp(Src) 98.5 F (36.9 C) (Oral)  Resp 24   Ht 5\' 1"  (1.549 m)  Wt 159 lb (72.122 kg)  BMI 30.06 kg/m2  SpO2 100%  LMP 06/20/2014 Physical Exam  Nursing note and vitals reviewed. Constitutional: She is oriented to person, place, and time. She appears well-developed and well-nourished. She appears distressed.  HENT:  Head: Normocephalic and atraumatic.  Nose: Nose normal.  Mouth/Throat: Oropharynx is clear and moist. No oropharyngeal exudate.  Eyes: Conjunctivae and EOM are normal. Pupils are equal, round, and reactive to light. No scleral icterus.  Neck: Normal range of motion. Neck supple. No JVD present. No tracheal deviation present. No thyromegaly present.  Cardiovascular: Normal rate, regular rhythm and normal heart sounds.  Exam reveals no gallop and no friction rub.   No murmur heard. Pulmonary/Chest: Effort normal and breath sounds normal. No respiratory distress. She has no wheezes. She exhibits no tenderness.  Abdominal: Soft. Bowel sounds are normal. She exhibits no distension and no mass. There is tenderness. There is rebound and guarding.  Bilateral lower quadrant tenderness to palpation. Negative Rosving and obturator signs  Genitourinary:  Moderate purulence seen in the vaginal canal. There is CMT and left adnexal tenderness.  Musculoskeletal: Normal range of motion. She exhibits no edema and no tenderness.  Lymphadenopathy:    She has no cervical adenopathy.  Neurological: She is alert and oriented to person, place, and time.  Skin: Skin is warm and dry. No  rash noted. She is not diaphoretic. No erythema. No pallor.    ED Course  Procedures (including critical care time) Labs Review Labs Reviewed  WET PREP, GENITAL - Abnormal; Notable for the following:    Clue Cells Wet Prep HPF POC FEW (*)    WBC, Wet Prep HPF POC FEW (*)    All other components within normal limits  CBC WITH DIFFERENTIAL - Abnormal; Notable for the following:    WBC 16.4 (*)    Neutrophils Relative % 82 (*)    Neutro Abs 13.3 (*)     Lymphocytes Relative 9 (*)    Monocytes Absolute 1.4 (*)    All other components within normal limits  COMPREHENSIVE METABOLIC PANEL - Abnormal; Notable for the following:    GFR calc non Af Amer 78 (*)    All other components within normal limits  URINALYSIS, ROUTINE W REFLEX MICROSCOPIC - Abnormal; Notable for the following:    APPearance CLOUDY (*)    Ketones, ur 40 (*)    Leukocytes, UA SMALL (*)    All other components within normal limits  URINE MICROSCOPIC-ADD ON - Abnormal; Notable for the following:    Squamous Epithelial / LPF FEW (*)    Bacteria, UA FEW (*)    All other components within normal limits  GC/CHLAMYDIA PROBE AMP  PREGNANCY, URINE  LACTIC ACID, PLASMA  HIV ANTIBODY (ROUTINE TESTING)    Imaging Review US Transvaginal Non-ob  06/30/2014   CLINICAL DATA:  Evaluate for tubo-ovarian abscess, given concern on CT.  EXAM: TRANSABDOMINAL AND TRANSVAGINAL ULTRASOUND OF PELVIS  TECHNIQUE: Both transabdominal and transvaginal ultrasound examinations of the pelvis were performed. Transabdominal technique was performed for global imaging of the pelvis including uterus, ovaries, adnexal regions, and pelvic cul-de-sac. It was necessary to proceed with endovaginal exam following the transabdominal exam to visualize the uterus and ovaries in greater detail.  COMPARISON:  CT of the abdomen and pelvis performed earlier today at 3:26 a.m.  FINDINGS: Uterus  Measurements: 8.1 x 3.2 x 4.9 cm. A 2.6 x 2.0 x 1.8 cm fibroid is noted on the right side, and a 4.0 x 3.9 x 4.0 cm fibroid is noted on the left side.  Endometrium  Thickness: 0.9 cm.  No focal abnormality visualized.  Right ovary  Measurements: 3.8 x 2.5 x 2.1 cm. Normal appearance/no adnexal mass.  Left ovary  Measurements: 3.8 x 2.7 x 2.1 cm. There is a complex fluid-filled structure at the left adnexa, with mild internal echoes and associated nodularity. This appears to reflect the left-sided fallopian tube, with underlying wall  thickening. Depending on clinical findings, this may reflect a hydrosalpinx or pyosalpinx.  Other findings  A small amount of free fluid is noted within the pelvic cul-de-sac.  IMPRESSION: 1. Complex fluid-filled structure at the left adnexa, with mild internal echoes and associated soft tissue nodularity. This appears to reflect the left-sided fallopian tube, with underlying wall thickening. Depending on clinical findings, this may reflect a hydrosalpinx or pyosalpinx. 2. The left ovary is otherwise unremarkable in appearance. No definite evidence of tubo-ovarian abscess. 3. Uterine fibroids noted, measuring up to 4.0 cm.   Electronically Signed   By: Garald Balding M.D.   On: 06/30/2014 06:29   US Pelvis Complete  06/30/2014   CLINICAL DATA:  Evaluate for tubo-ovarian abscess, given concern on CT.  EXAM: TRANSABDOMINAL AND TRANSVAGINAL ULTRASOUND OF PELVIS  TECHNIQUE: Both transabdominal and transvaginal ultrasound examinations of the pelvis were performed. Transabdominal technique was performed  for global imaging of the pelvis including uterus, ovaries, adnexal regions, and pelvic cul-de-sac. It was necessary to proceed with endovaginal exam following the transabdominal exam to visualize the uterus and ovaries in greater detail.  COMPARISON:  CT of the abdomen and pelvis performed earlier today at 3:26 a.m.  FINDINGS: Uterus  Measurements: 8.1 x 3.2 x 4.9 cm. A 2.6 x 2.0 x 1.8 cm fibroid is noted on the right side, and a 4.0 x 3.9 x 4.0 cm fibroid is noted on the left side.  Endometrium  Thickness: 0.9 cm.  No focal abnormality visualized.  Right ovary  Measurements: 3.8 x 2.5 x 2.1 cm. Normal appearance/no adnexal mass.  Left ovary  Measurements: 3.8 x 2.7 x 2.1 cm. There is a complex fluid-filled structure at the left adnexa, with mild internal echoes and associated nodularity. This appears to reflect the left-sided fallopian tube, with underlying wall thickening. Depending on clinical findings, this may  reflect a hydrosalpinx or pyosalpinx.  Other findings  A small amount of free fluid is noted within the pelvic cul-de-sac.  IMPRESSION: 1. Complex fluid-filled structure at the left adnexa, with mild internal echoes and associated soft tissue nodularity. This appears to reflect the left-sided fallopian tube, with underlying wall thickening. Depending on clinical findings, this may reflect a hydrosalpinx or pyosalpinx. 2. The left ovary is otherwise unremarkable in appearance. No definite evidence of tubo-ovarian abscess. 3. Uterine fibroids noted, measuring up to 4.0 cm.   Electronically Signed   By: Garald Balding M.D.   On: 06/30/2014 06:29   Ct Abdomen Pelvis W Contrast  06/30/2014   CLINICAL DATA:  Abdominal pain. Tenderness to palpation at both lower quadrants. Nausea and vomiting.  EXAM: CT ABDOMEN AND PELVIS WITH CONTRAST  TECHNIQUE: Multidetector CT imaging of the abdomen and pelvis was performed using the standard protocol following bolus administration of intravenous contrast.  CONTRAST:  100 mL of Omnipaque 300 IV contrast  COMPARISON:  Abdominal radiograph performed 05/15/2011  FINDINGS: Minimal bibasilar atelectasis or scarring is noted.  The liver and spleen are unremarkable in appearance. The gallbladder is within normal limits. The pancreas and adrenal glands are unremarkable.  The kidneys are unremarkable in appearance. There is no evidence of hydronephrosis. No renal or ureteral stones are seen. No perinephric stranding is appreciated.  Trace free fluid within the pelvis is likely physiologic in nature. The small bowel is unremarkable in appearance. The stomach is within normal limits. No acute vascular abnormalities are seen.  The appendix is normal in caliber, without evidence for appendicitis. The colon is unremarkable in appearance.  The bladder is mildly distended and grossly unremarkable. The uterus is grossly unremarkable. A fluid-filled tubular structure is noted at the left adnexa,  which may reflect hydrosalpinx or pyosalpinx. The left ovary is difficult to fully assess; a tubo-ovarian abscess cannot be excluded. No inguinal lymphadenopathy is seen.  No acute osseous abnormalities are identified.  IMPRESSION: Fluid-filled tubular structure at the left adnexa may reflect hydrosalpinx or pyosalpinx. The left ovary is not well characterized; a tubo-ovarian abscess cannot be excluded. Would correlate for associated symptoms and signs of infection.   Electronically Signed   By: Garald Balding M.D.   On: 06/30/2014 03:51     EKG Interpretation None      MDM   Final diagnoses:  None    Patient presents emergency department out of concern for abdominal pain. Physical exam has a concern for acute appendicitis. A likely with a CT scan. Patient is  given fluids pain medication and antinausea medication.  CT scan reveals possible pyosalpinx which is confirmed on physical exam. We'll send for transvaginal ultrasound for concern for TOA. If negative will discharge home with antibiotics for PID  Transvaginal ultrasound as above is concerning for a hydrosalpinx, and fallopian tube wall thickening. Read as no definite TOA. I consult with Dr[facility]Yure at [facility] who still recommend for the patient to be transferred over. She was given doxycycline and cefoxitin in the emergency department. Vital signs remained stable, patient safe for discharge.  Everlene Balls, MD 06/30/14 [phone]

## 2014-06-30 NOTE — ED Notes (Signed)
Pt c/o abdominal pain since today. Tenderness noted on palpation to L lower quadrant. Pt reports last bowel movement today , which was hard. Pt also reports chills, denies fever. Reports nausea yesterday that worsened after zofran given in triage. Emesis noted in trashcan. Abdomen appears distended and tender

## 2014-06-30 NOTE — ED Notes (Signed)
Notified [facility] for transport

## 2014-06-30 NOTE — ED Notes (Signed)
Dr. Oni at bedside. 

## 2014-07-01 ENCOUNTER — Encounter (HOSPITAL_COMMUNITY): Payer: Self-pay | Admitting: Obstetrics & Gynecology

## 2014-07-01 LAB — URINE CULTURE: Colony Count: 30000

## 2014-07-01 NOTE — Progress Notes (Signed)
Faculty Practice OB/GYN Attending Note  Subjective:  Reports decreased pain but taking Dilaudid more than the Ibuprofen. No nausea, tolerating regular diet. Ambulating. No vaginal bleeding.   Admitted on 06/30/2014 for Tubo-ovarian abscess.   Objective:  Blood pressure 128/75, pulse 84, temperature 99.2 F (37.3 C), temperature source Oral, resp. rate 18, height '5\' 1"'  (1.549 m), weight 159 lb (72.122 kg), last menstrual period 06/20/2014, SpO2 97.00%.  Gen: NAD Lungs: CTAB Abdomen: soft, mild lower abdominal tenderness to palpation Cervix: Deferred Ext: 2+ DTRs, no edema, no cyanosis, negative Homan's sign  Labs and Imaging  Results for orders placed during the hospital encounter of 06/30/14 (from the past 48 hour(s))  CBC WITH DIFFERENTIAL     Status: Abnormal   Collection Time    06/30/14  1:11 AM      Result Value Ref Range   WBC 16.4 (*) 4.0 - 10.5 K/uL   RBC 4.41  3.87 - 5.11 MIL/uL   Hemoglobin 13.4  12.0 - 15.0 g/dL   HCT 39.2  36.0 - 46.0 %   MCV 88.9  78.0 - 100.0 fL   MCH 30.4  26.0 - 34.0 pg   MCHC 34.2  30.0 - 36.0 g/dL   RDW 13.8  11.5 - 15.5 %   Platelets 304  150 - 400 K/uL   Neutrophils Relative % 82 (*) 43 - 77 %   Neutro Abs 13.3 (*) 1.7 - 7.7 K/uL   Lymphocytes Relative 9 (*) 12 - 46 %   Lymphs Abs 1.5  0.7 - 4.0 K/uL   Monocytes Relative 8  3 - 12 %   Monocytes Absolute 1.4 (*) 0.1 - 1.0 K/uL   Eosinophils Relative 1  0 - 5 %   Eosinophils Absolute 0.2  0.0 - 0.7 K/uL   Basophils Relative 0  0 - 1 %   Basophils Absolute 0.0  0.0 - 0.1 K/uL  COMPREHENSIVE METABOLIC PANEL     Status: Abnormal   Collection Time    06/30/14  1:11 AM      Result Value Ref Range   Sodium 138  137 - 147 mEq/L   Potassium 4.3  3.7 - 5.3 mEq/L   Chloride 102  96 - 112 mEq/L   CO2 21  19 - 32 mEq/L   Glucose, Bld 96  70 - 99 mg/dL   BUN 10  6 - 23 mg/dL   Creatinine, Ser 0.95  0.50 - 1.10 mg/dL   Calcium 9.2  8.4 - 10.5 mg/dL   Total Protein 6.6  6.0 - 8.3 g/dL    Albumin 3.5  3.5 - 5.2 g/dL   AST 23  0 - 37 U/L   ALT 19  0 - 35 U/L   Alkaline Phosphatase 85  39 - 117 U/L   Total Bilirubin 0.9  0.3 - 1.2 mg/dL   GFR calc non Af Amer 78 (*) >90 mL/min   GFR calc Af Amer >90  >90 mL/min   Comment: (NOTE)     The eGFR has been calculated using the CKD EPI equation.     This calculation has not been validated in all clinical situations.     eGFR's persistently <90 mL/min signify possible Chronic Kidney     Disease.   Anion gap 15  5 - 15  LACTIC ACID, PLASMA     Status: None   Collection Time    06/30/14  2:00 AM      Result Value Ref Range  Lactic Acid, Venous 1.0  0.5 - 2.2 mmol/L  URINALYSIS, ROUTINE W REFLEX MICROSCOPIC     Status: Abnormal   Collection Time    06/30/14  2:40 AM      Result Value Ref Range   Color, Urine YELLOW  YELLOW   APPearance CLOUDY (*) CLEAR   Specific Gravity, Urine 1.011  1.005 - 1.030   pH 5.5  5.0 - 8.0   Glucose, UA NEGATIVE  NEGATIVE mg/dL   Hgb urine dipstick NEGATIVE  NEGATIVE   Bilirubin Urine NEGATIVE  NEGATIVE   Ketones, ur 40 (*) NEGATIVE mg/dL   Protein, ur NEGATIVE  NEGATIVE mg/dL   Urobilinogen, UA 0.2  0.0 - 1.0 mg/dL   Nitrite NEGATIVE  NEGATIVE   Leukocytes, UA SMALL (*) NEGATIVE  PREGNANCY, URINE     Status: None   Collection Time    06/30/14  2:40 AM      Result Value Ref Range   Preg Test, Ur NEGATIVE  NEGATIVE   Comment:            THE SENSITIVITY OF THIS     METHODOLOGY IS >20 mIU/mL.  URINE MICROSCOPIC-ADD ON     Status: Abnormal   Collection Time    06/30/14  2:40 AM      Result Value Ref Range   Squamous Epithelial / LPF FEW (*) RARE   WBC, UA 7-10  <3 WBC/hpf   Bacteria, UA FEW (*) RARE  HIV ANTIBODY (ROUTINE TESTING)     Status: None   Collection Time    06/30/14  4:11 AM      Result Value Ref Range   HIV 1&2 Ab, 4th Generation NONREACTIVE  NONREACTIVE   Comment: (NOTE)     A NONREACTIVE HIV Ag/Ab result does not exclude HIV infection since     the time frame for  seroconversion is variable. If acute HIV infection     is suspected, a HIV-1 RNA Qualitative TMA test is recommended.     HIV-1/2 Antibody Diff         Not indicated.     HIV-1 RNA, Qual TMA           Not indicated.     PLEASE NOTE: This information has been disclosed to you from records     whose confidentiality may be protected by state law. If your state     requires such protection, then the state law prohibits you from making     any further disclosure of the information without the specific written     consent of the person to whom it pertains, or as otherwise permitted     by law. A general authorization for the release of medical or other     information is NOT sufficient for this purpose.     The performance of this assay has not been clinically validated in     patients less than 7 years old.     Performed at [facility], [facility]     Status: Abnormal   Collection Time    06/30/14  4:48 AM      Result Value Ref Range   Yeast Wet Prep HPF POC NONE SEEN  NONE SEEN   Trich, Wet Prep NONE SEEN  NONE SEEN   Clue Cells Wet Prep HPF POC FEW (*) NONE SEEN   WBC, Wet Prep HPF POC FEW (*) NONE SEEN  GC/[place] pending  06/30/2014 TRANSABDOMINAL AND TRANSVAGINAL ULTRASOUND OF PELVIS  CLINICAL DATA: Evaluate for tubo-ovarian abscess, given concern on CT. TECHNIQUE: Both transabdominal and transvaginal ultrasound examinations of the pelvis were performed. Transabdominal technique was performed for global imaging of the pelvis including uterus, ovaries, adnexal regions, and pelvic cul-de-sac. It was necessary to proceed with endovaginal exam following the transabdominal exam to visualize the uterus and ovaries in greater detail. COMPARISON: CT of the abdomen and pelvis performed earlier today at 3:26 a.m. FINDINGS: Uterus Measurements: 8.1 x 3.2 x 4.9 cm. A 2.6 x 2.0 x 1.8 cm fibroid is noted on the right side, and a 4.0 x 3.9 x 4.0 cm fibroid is noted on the left side. Endometrium  Thickness: 0.9 cm. No focal abnormality visualized. Right ovary Measurements: 3.8 x 2.5 x 2.1 cm. Normal appearance/no adnexal mass. Left ovary Measurements: 3.8 x 2.7 x 2.1 cm. There is a complex fluid-filled structure at the left adnexa, with mild internal echoes and associated nodularity. This appears to reflect the left-sided fallopian tube, with underlying wall thickening. Depending on clinical findings, this may reflect a hydrosalpinx or pyosalpinx. Other findings A small amount of free fluid is noted within the pelvic cul-de-sac. IMPRESSION: 1. Complex fluid-filled structure at the left adnexa, with mild internal echoes and associated soft tissue nodularity. This appears to reflect the left-sided fallopian tube, with underlying wall thickening. Depending on clinical findings, this may reflect a hydrosalpinx or pyosalpinx. 2. The left ovary is otherwise unremarkable in appearance. No definite evidence of tubo-ovarian abscess. 3. Uterine fibroids noted, measuring up to 4.0 cm. Electronically Signed By: Garald Balding M.D. On: 06/30/2014 06:29   Assessment & Plan:  33 y.o. [id number] admitted for left TOA.   Continue IV Cefoxitin and oral Doxycycline, will switch to oral meds tomorrow morning  Pain medications ordered as needed; encouraged to take Ibuprofen for antiinflammatory properties  Continue close observation.  Verita Schneiders, MD, [place] Attending [place], [facility]

## 2014-07-02 LAB — CBC WITH DIFFERENTIAL/PLATELET
BASOS ABS: 0 10*3/uL (ref 0.0–0.1)
Basophils Relative: 0 % (ref 0–1)
Eosinophils Absolute: 0.1 10*3/uL (ref 0.0–0.7)
Eosinophils Relative: 1 % (ref 0–5)
HCT: 35 % — ABNORMAL LOW (ref 36.0–46.0)
Hemoglobin: 11.7 g/dL — ABNORMAL LOW (ref 12.0–15.0)
LYMPHS PCT: 13 % (ref 12–46)
Lymphs Abs: 1.6 10*3/uL (ref 0.7–4.0)
MCH: 30 pg (ref 26.0–34.0)
MCHC: 33.4 g/dL (ref 30.0–36.0)
MCV: 89.7 fL (ref 78.0–100.0)
Monocytes Absolute: 1.7 10*3/uL — ABNORMAL HIGH (ref 0.1–1.0)
Monocytes Relative: 14 % — ABNORMAL HIGH (ref 3–12)
NEUTROS ABS: 8.6 10*3/uL — AB (ref 1.7–7.7)
NEUTROS PCT: 72 % (ref 43–77)
PLATELETS: 257 10*3/uL (ref 150–400)
RBC: 3.9 MIL/uL (ref 3.87–5.11)
RDW: 13.7 % (ref 11.5–15.5)
WBC: 12 10*3/uL — AB (ref 4.0–10.5)

## 2014-07-02 LAB — GC/CHLAMYDIA PROBE AMP
CT Probe RNA: NEGATIVE
GC PROBE AMP APTIMA: NEGATIVE

## 2014-07-02 MED ORDER — FLUCONAZOLE 150 MG PO TABS: 150.0000 mg | ORAL_TABLET | Freq: Every day | ORAL | Status: DC

## 2014-07-02 MED ORDER — FLUCONAZOLE 150 MG PO TABS
150.0000 mg | ORAL_TABLET | Freq: Once | ORAL | Status: AC
  Administered 2014-07-02: 150 mg via ORAL
  Filled 2014-07-02: qty 1

## 2014-07-02 MED ORDER — BISACODYL 5 MG PO TBEC
5.0000 mg | DELAYED_RELEASE_TABLET | Freq: Every day | ORAL | Status: DC | PRN
  Filled 2014-07-02: qty 1

## 2014-07-02 NOTE — Progress Notes (Signed)
Ur chart review completed.  

## 2014-07-02 NOTE — Progress Notes (Signed)
Patient ID: Jessica Case, female   DOB: 03-03-1981, 33 y.o.   MRN: [id number]  FACULTY PRACTICE PROGRESS NOTE  Jessica Case TGG:[id number] DOB: [birth year] DOA: 06/30/2014 PCP: No PCP Per Patient  Assessment/Plan: 1. Tubo-ovarian Abscess Recheck CBC.  Continue on IV antibiotics for now as continues to have significant pain.  Continue with pain control.  Antibiotics: Doxycycline and Cefoxitin started 9/12.   HPI/Subjective: Patient continues to have pain in lower quadrants L>R - having difficulty sleeping because of the pain. Pain controlled with narcotics and NSAIDs.  Nausea improving.   Objective: Filed Vitals:   07/02/14 0511  BP: 117/71  Pulse: 80  Temp: 98.7 F (37.1 C)  Resp: 16   No intake or output data in the 24 hours ending 07/02/14 0856 Filed Weights   06/30/14 0109  Weight: 72.122 kg (159 lb)    Exam:   General:  A&Ox3.  NAD.  Abdomen: Soft, tender in the lower quadrants L>R with guarding and rebound tenderness.    Extremity: No edema, pulses 2+   Data Reviewed: Basic Metabolic Panel:  Recent Labs Lab 06/30/14 0111  NA 138  K 4.3  CL 102  CO2 21  GLUCOSE 96  BUN 10  CREATININE 0.95  CALCIUM 9.2   Liver Function Tests:  Recent Labs Lab 06/30/14 0111  AST 23  ALT 19  ALKPHOS 85  BILITOT 0.9  PROT 6.6  ALBUMIN 3.5   No results found for this basename: LIPASE, AMYLASE,  in the last 168 hours No results found for this basename: AMMONIA,  in the last 168 hours CBC:  Recent Labs Lab 06/30/14 0111  WBC 16.4*  NEUTROABS 13.3*  HGB 13.4  HCT 39.2  MCV 88.9  PLT 304   Cardiac Enzymes: No results found for this basename: CKTOTAL, CKMB, CKMBINDEX, TROPONINI,  in the last 168 hours BNP (last 3 results) No results found for this basename: PROBNP,  in the last 8760 hours CBG: No results found for this basename: GLUCAP,  in the last 168 hours  Recent Results (from the past 240 hour(s))  URINE CULTURE     Status: None   Collection Time    06/30/14  2:30 AM      Result Value Ref Range Status   Specimen Description     Final   Value: URINE, CLEAN CATCH     Performed at [place] Requests     Final   Value: [id number]     Performed at [place] Time     Final   Value: 06/30/2014 19:34     Performed at [place]     Final   Value: 30,000 COLONIES/ML     Performed at [facility]   Culture     Final   Value: Multiple bacterial morphotypes present, none predominant. Suggest appropriate recollection if clinically indicated.     Performed at [facility]   Report Status 07/01/2014 FINAL   Final  WET PREP, [facility]     Status: Abnormal   Collection Time    06/30/14  4:48 AM      Result Value Ref Range Status   Yeast Wet Prep HPF POC NONE SEEN  NONE SEEN Final   Trich, Wet Prep NONE SEEN  NONE SEEN Final   Clue Cells Wet Prep HPF POC FEW (*) NONE SEEN Final   WBC, Wet Prep HPF POC FEW (*)  NONE SEEN Final     Studies: No results found.  Scheduled Meds: . cefOXitin  2 g Intravenous 4 times per day  . doxycycline  100 mg Oral Q12H   Continuous Infusions: . lactated ringers 125 mL/hr at 07/01/14 2339    Principal Problem:   Tubo-ovarian abscess  Time Spent: 25 minutes.  [place], [place]  Faculty Practice Phone: [phone] 07/02/2014, 8:56 AM  LOS: 2 days

## 2014-07-03 DIAGNOSIS — N7093 Salpingitis and oophoritis, unspecified: Principal | ICD-10-CM

## 2014-07-03 LAB — CBC WITH DIFFERENTIAL/PLATELET
BASOS ABS: 0 10*3/uL (ref 0.0–0.1)
Basophils Relative: 0 % (ref 0–1)
Eosinophils Absolute: 0.1 10*3/uL (ref 0.0–0.7)
Eosinophils Relative: 1 % (ref 0–5)
HCT: 33.8 % — ABNORMAL LOW (ref 36.0–46.0)
HEMOGLOBIN: 11.4 g/dL — AB (ref 12.0–15.0)
LYMPHS PCT: 17 % (ref 12–46)
Lymphs Abs: 1.9 10*3/uL (ref 0.7–4.0)
MCH: 30.1 pg (ref 26.0–34.0)
MCHC: 33.7 g/dL (ref 30.0–36.0)
MCV: 89.2 fL (ref 78.0–100.0)
MONOS PCT: 20 % — AB (ref 3–12)
Monocytes Absolute: 2.2 10*3/uL — ABNORMAL HIGH (ref 0.1–1.0)
NEUTROS ABS: 6.8 10*3/uL (ref 1.7–7.7)
Neutrophils Relative %: 62 % (ref 43–77)
Platelets: 283 10*3/uL (ref 150–400)
RBC: 3.79 MIL/uL — ABNORMAL LOW (ref 3.87–5.11)
RDW: 13.6 % (ref 11.5–15.5)
WBC: 11 10*3/uL — AB (ref 4.0–10.5)

## 2014-07-03 MED ORDER — ONDANSETRON HCL 4 MG PO TABS: 4.0000 mg | ORAL_TABLET | Freq: Three times a day (TID) | ORAL | Status: DC | PRN

## 2014-07-03 MED ORDER — METRONIDAZOLE 500 MG PO TABS: 500.0000 mg | ORAL_TABLET | Freq: Two times a day (BID) | ORAL | Status: DC

## 2014-07-03 MED ORDER — FLUCONAZOLE 150 MG PO TABS: 150.0000 mg | ORAL_TABLET | ORAL | Status: DC | PRN

## 2014-07-03 MED ORDER — DOXYCYCLINE HYCLATE 100 MG PO TABS: 100.0000 mg | ORAL_TABLET | Freq: Two times a day (BID) | ORAL | Status: DC

## 2014-07-03 MED ORDER — OXYCODONE-ACETAMINOPHEN 5-325 MG PO TABS: 1.0000 | ORAL_TABLET | ORAL | Status: DC | PRN

## 2014-07-03 MED ORDER — IBUPROFEN 800 MG PO TABS: 800.0000 mg | ORAL_TABLET | Freq: Three times a day (TID) | ORAL | Status: DC

## 2014-07-03 NOTE — Discharge Summary (Signed)
Physician Discharge Summary  Patient ID: Jessica Case MRN: [id number] DOB/AGE: Sep 18, 1981 33 y.o.  Admit date: 06/30/2014 Discharge date: 07/03/2014  Admission Diagnoses: Tubo-ovarian abscess   Discharge Diagnoses: Same.  Principal Problem:   Tubo-ovarian abscess   Discharged Condition: stable  Hospital Course: Admitted 06/30/2014 w/ TOA. Afebrile throughout stay. Leukocytosis present on admission improved significantly. On Dilaudid and Ibuprofen for pain. Initially difficult to control. Much better day of D/C. Tolerating POs.  Anxious to go home today.   Consults: None  Significant Diagnostic Studies:  Results for orders placed during the hospital encounter of 06/30/14 (from the past 168 hour(s))  CBC WITH DIFFERENTIAL   Collection Time    06/30/14  1:11 AM      Result Value Ref Range   WBC 16.4 (*) 4.0 - 10.5 K/uL   RBC 4.41  3.87 - 5.11 MIL/uL   Hemoglobin 13.4  12.0 - 15.0 g/dL   HCT 39.2  36.0 - 46.0 %   MCV 88.9  78.0 - 100.0 fL   MCH 30.4  26.0 - 34.0 pg   MCHC 34.2  30.0 - 36.0 g/dL   RDW 13.8  11.5 - 15.5 %   Platelets 304  150 - 400 K/uL   Neutrophils Relative % 82 (*) 43 - 77 %   Neutro Abs 13.3 (*) 1.7 - 7.7 K/uL   Lymphocytes Relative 9 (*) 12 - 46 %   Lymphs Abs 1.5  0.7 - 4.0 K/uL   Monocytes Relative 8  3 - 12 %   Monocytes Absolute 1.4 (*) 0.1 - 1.0 K/uL   Eosinophils Relative 1  0 - 5 %   Eosinophils Absolute 0.2  0.0 - 0.7 K/uL   Basophils Relative 0  0 - 1 %   Basophils Absolute 0.0  0.0 - 0.1 K/uL  COMPREHENSIVE METABOLIC PANEL   Collection Time    06/30/14  1:11 AM      Result Value Ref Range   Sodium 138  137 - 147 mEq/L   Potassium 4.3  3.7 - 5.3 mEq/L   Chloride 102  96 - 112 mEq/L   CO2 21  19 - 32 mEq/L   Glucose, Bld 96  70 - 99 mg/dL   BUN 10  6 - 23 mg/dL   Creatinine, Ser 0.95  0.50 - 1.10 mg/dL   Calcium 9.2  8.4 - 10.5 mg/dL   Total Protein 6.6  6.0 - 8.3 g/dL   Albumin 3.5  3.5 - 5.2 g/dL   AST 23  0 - 37 U/L   ALT 19  0  - 35 U/L   Alkaline Phosphatase 85  39 - 117 U/L   Total Bilirubin 0.9  0.3 - 1.2 mg/dL   GFR calc non Af Amer 78 (*) >90 mL/min   GFR calc Af Amer >90  >90 mL/min   Anion gap 15  5 - 15  LACTIC ACID, PLASMA   Collection Time    06/30/14  2:00 AM      Result Value Ref Range   Lactic Acid, Venous 1.0  0.5 - 2.2 mmol/L  URINE CULTURE   Collection Time    06/30/14  2:30 AM      Result Value Ref Range   Specimen Description       Value: URINE, CLEAN CATCH     Performed at [facility]   Special Requests       Value: ADD [place]     Performed at [facility]  [facility]   Culture  Setup Time       Value: 06/30/2014 19:34     Performed at [facility]       Value: 30,000 COLONIES/ML     Performed at [facility]   Culture       Value: Multiple bacterial morphotypes present, none predominant. Suggest appropriate recollection if clinically indicated.     Performed at [facility]   Report Status 07/01/2014 FINAL    URINALYSIS, ROUTINE W REFLEX MICROSCOPIC   Collection Time    06/30/14  2:40 AM      Result Value Ref Range   Color, Urine YELLOW  YELLOW   APPearance CLOUDY (*) CLEAR   Specific Gravity, Urine 1.011  1.005 - 1.030   pH 5.5  5.0 - 8.0   Glucose, UA NEGATIVE  NEGATIVE mg/dL   Hgb urine dipstick NEGATIVE  NEGATIVE   Bilirubin Urine NEGATIVE  NEGATIVE   Ketones, ur 40 (*) NEGATIVE mg/dL   Protein, ur NEGATIVE  NEGATIVE mg/dL   Urobilinogen, UA 0.2  0.0 - 1.0 mg/dL   Nitrite NEGATIVE  NEGATIVE   Leukocytes, UA SMALL (*) NEGATIVE  PREGNANCY, URINE   Collection Time    06/30/14  2:40 AM      Result Value Ref Range   Preg Test, Ur NEGATIVE  NEGATIVE  URINE MICROSCOPIC-ADD ON   Collection Time    06/30/14  2:40 AM      Result Value Ref Range   Squamous Epithelial / LPF FEW (*) RARE   WBC, UA 7-10  <3 WBC/hpf   Bacteria, UA FEW (*) RARE  HIV ANTIBODY (ROUTINE TESTING)   Collection Time    06/30/14  4:11 AM       Result Value Ref Range   HIV 1&2 Ab, 4th Generation NONREACTIVE  NONREACTIVE  GC/CHLAMYDIA PROBE AMP   Collection Time    06/30/14  4:47 AM      Result Value Ref Range   CT Probe RNA NEGATIVE  NEGATIVE   GC Probe RNA NEGATIVE  NEGATIVE  WET PREP, GENITAL   Collection Time    06/30/14  4:48 AM      Result Value Ref Range   Yeast Wet Prep HPF POC NONE SEEN  NONE SEEN   Trich, Wet Prep NONE SEEN  NONE SEEN   Clue Cells Wet Prep HPF POC FEW (*) NONE SEEN   WBC, Wet Prep HPF POC FEW (*) NONE SEEN  CBC WITH DIFFERENTIAL   Collection Time    07/02/14  9:22 AM      Result Value Ref Range   WBC 12.0 (*) 4.0 - 10.5 K/uL   RBC 3.90  3.87 - 5.11 MIL/uL   Hemoglobin 11.7 (*) 12.0 - 15.0 g/dL   HCT 35.0 (*) 36.0 - 46.0 %   MCV 89.7  78.0 - 100.0 fL   MCH 30.0  26.0 - 34.0 pg   MCHC 33.4  30.0 - 36.0 g/dL   RDW 13.7  11.5 - 15.5 %   Platelets 257  150 - 400 K/uL   Neutrophils Relative % 72  43 - 77 %   Neutro Abs 8.6 (*) 1.7 - 7.7 K/uL   Lymphocytes Relative 13  12 - 46 %   Lymphs Abs 1.6  0.7 - 4.0 K/uL   Monocytes Relative 14 (*) 3 - 12 %   Monocytes Absolute 1.7 (*) 0.1 - 1.0 K/uL   Eosinophils Relative  1  0 - 5 %   Eosinophils Absolute 0.1  0.0 - 0.7 K/uL   Basophils Relative 0  0 - 1 %   Basophils Absolute 0.0  0.0 - 0.1 K/uL  CBC WITH DIFFERENTIAL   Collection Time    07/03/14  5:41 AM      Result Value Ref Range   WBC 11.0 (*) 4.0 - 10.5 K/uL   RBC 3.79 (*) 3.87 - 5.11 MIL/uL   Hemoglobin 11.4 (*) 12.0 - 15.0 g/dL   HCT 33.8 (*) 36.0 - 46.0 %   MCV 89.2  78.0 - 100.0 fL   MCH 30.1  26.0 - 34.0 pg   MCHC 33.7  30.0 - 36.0 g/dL   RDW 13.6  11.5 - 15.5 %   Platelets 283  150 - 400 K/uL   Neutrophils Relative % 62  43 - 77 %   Neutro Abs 6.8  1.7 - 7.7 K/uL   Lymphocytes Relative 17  12 - 46 %   Lymphs Abs 1.9  0.7 - 4.0 K/uL   Monocytes Relative 20 (*) 3 - 12 %   Monocytes Absolute 2.2 (*) 0.1 - 1.0 K/uL   Eosinophils Relative 1  0 - 5 %   Eosinophils Absolute 0.1  0.0  - 0.7 K/uL   Basophils Relative 0  0 - 1 %   Basophils Absolute 0.0  0.0 - 0.1 K/uL   US Transvaginal Non-ob  06/30/2014   CLINICAL DATA:  Evaluate for tubo-ovarian abscess, given concern on CT.  EXAM: TRANSABDOMINAL AND TRANSVAGINAL ULTRASOUND OF PELVIS  TECHNIQUE: Both transabdominal and transvaginal ultrasound examinations of the pelvis were performed. Transabdominal technique was performed for global imaging of the pelvis including uterus, ovaries, adnexal regions, and pelvic cul-de-sac. It was necessary to proceed with endovaginal exam following the transabdominal exam to visualize the uterus and ovaries in greater detail.  COMPARISON:  CT of the abdomen and pelvis performed earlier today at 3:26 a.m.  FINDINGS: Uterus  Measurements: 8.1 x 3.2 x 4.9 cm. A 2.6 x 2.0 x 1.8 cm fibroid is noted on the right side, and a 4.0 x 3.9 x 4.0 cm fibroid is noted on the left side.  Endometrium  Thickness: 0.9 cm.  No focal abnormality visualized.  Right ovary  Measurements: 3.8 x 2.5 x 2.1 cm. Normal appearance/no adnexal mass.  Left ovary  Measurements: 3.8 x 2.7 x 2.1 cm. There is a complex fluid-filled structure at the left adnexa, with mild internal echoes and associated nodularity. This appears to reflect the left-sided fallopian tube, with underlying wall thickening. Depending on clinical findings, this may reflect a hydrosalpinx or pyosalpinx.  Other findings  A small amount of free fluid is noted within the pelvic cul-de-sac.  IMPRESSION: 1. Complex fluid-filled structure at the left adnexa, with mild internal echoes and associated soft tissue nodularity. This appears to reflect the left-sided fallopian tube, with underlying wall thickening. Depending on clinical findings, this may reflect a hydrosalpinx or pyosalpinx. 2. The left ovary is otherwise unremarkable in appearance. No definite evidence of tubo-ovarian abscess. 3. Uterine fibroids noted, measuring up to 4.0 cm.   Electronically Signed   By: Garald Balding M.D.   On: 06/30/2014 06:29   US Pelvis Complete  06/30/2014   CLINICAL DATA:  Evaluate for tubo-ovarian abscess, given concern on CT.  EXAM: TRANSABDOMINAL AND TRANSVAGINAL ULTRASOUND OF PELVIS  TECHNIQUE: Both transabdominal and transvaginal ultrasound examinations of the pelvis were performed. Transabdominal technique was performed for  global imaging of the pelvis including uterus, ovaries, adnexal regions, and pelvic cul-de-sac. It was necessary to proceed with endovaginal exam following the transabdominal exam to visualize the uterus and ovaries in greater detail.  COMPARISON:  CT of the abdomen and pelvis performed earlier today at 3:26 a.m.  FINDINGS: Uterus  Measurements: 8.1 x 3.2 x 4.9 cm. A 2.6 x 2.0 x 1.8 cm fibroid is noted on the right side, and a 4.0 x 3.9 x 4.0 cm fibroid is noted on the left side.  Endometrium  Thickness: 0.9 cm.  No focal abnormality visualized.  Right ovary  Measurements: 3.8 x 2.5 x 2.1 cm. Normal appearance/no adnexal mass.  Left ovary  Measurements: 3.8 x 2.7 x 2.1 cm. There is a complex fluid-filled structure at the left adnexa, with mild internal echoes and associated nodularity. This appears to reflect the left-sided fallopian tube, with underlying wall thickening. Depending on clinical findings, this may reflect a hydrosalpinx or pyosalpinx.  Other findings  A small amount of free fluid is noted within the pelvic cul-de-sac.  IMPRESSION: 1. Complex fluid-filled structure at the left adnexa, with mild internal echoes and associated soft tissue nodularity. This appears to reflect the left-sided fallopian tube, with underlying wall thickening. Depending on clinical findings, this may reflect a hydrosalpinx or pyosalpinx. 2. The left ovary is otherwise unremarkable in appearance. No definite evidence of tubo-ovarian abscess. 3. Uterine fibroids noted, measuring up to 4.0 cm.   Electronically Signed   By: Garald Balding M.D.   On: 06/30/2014 06:29   Ct Abdomen Pelvis  W Contrast  06/30/2014   CLINICAL DATA:  Abdominal pain. Tenderness to palpation at both lower quadrants. Nausea and vomiting.  EXAM: CT ABDOMEN AND PELVIS WITH CONTRAST  TECHNIQUE: Multidetector CT imaging of the abdomen and pelvis was performed using the standard protocol following bolus administration of intravenous contrast.  CONTRAST:  100 mL of Omnipaque 300 IV contrast  COMPARISON:  Abdominal radiograph performed 05/15/2011  FINDINGS: Minimal bibasilar atelectasis or scarring is noted.  The liver and spleen are unremarkable in appearance. The gallbladder is within normal limits. The pancreas and adrenal glands are unremarkable.  The kidneys are unremarkable in appearance. There is no evidence of hydronephrosis. No renal or ureteral stones are seen. No perinephric stranding is appreciated.  Trace free fluid within the pelvis is likely physiologic in nature. The small bowel is unremarkable in appearance. The stomach is within normal limits. No acute vascular abnormalities are seen.  The appendix is normal in caliber, without evidence for appendicitis. The colon is unremarkable in appearance.  The bladder is mildly distended and grossly unremarkable. The uterus is grossly unremarkable. A fluid-filled tubular structure is noted at the left adnexa, which may reflect hydrosalpinx or pyosalpinx. The left ovary is difficult to fully assess; a tubo-ovarian abscess cannot be excluded. No inguinal lymphadenopathy is seen.  No acute osseous abnormalities are identified.  IMPRESSION: Fluid-filled tubular structure at the left adnexa may reflect hydrosalpinx or pyosalpinx. The left ovary is not well characterized; a tubo-ovarian abscess cannot be excluded. Would correlate for associated symptoms and signs of infection.   Electronically Signed   By: Garald Balding M.D.   On: 06/30/2014 03:51    Treatments: IV hydration, antibiotics: Doxycycline, Mefoxin  and analgesia: Dilaudid and Ibuprofen  Discharge Exam: Blood  pressure 132/89, pulse 65, temperature 98.4 F (36.9 C), temperature source Oral, resp. rate 18, height 5\' 1"  (1.549 m), weight 72.122 kg (159 lb), last menstrual period 06/20/2014, SpO2  100.00%. General appearance: alert, cooperative and no distress GI: soft, non-tender; bowel sounds normal; no masses,  no organomegaly  Disposition: 01-Home or Self Care     Medication List         doxycycline 100 MG tablet  Commonly known as:  VIBRA-TABS  Take 1 tablet (100 mg total) by mouth 2 (two) times daily.     fluconazole 150 MG tablet  Commonly known as:  DIFLUCAN  Take 1 tablet (150 mg total) by mouth every 3 (three) days as needed (for yeast infection).     ibuprofen 800 MG tablet  Commonly known as:  ADVIL,MOTRIN  Take 1 tablet (800 mg total) by mouth every 8 (eight) hours.     metroNIDAZOLE 500 MG tablet  Commonly known as:  FLAGYL  Take 1 tablet (500 mg total) by mouth 2 (two) times daily.     ondansetron 4 MG tablet  Commonly known as:  ZOFRAN  Take 1 tablet (4 mg total) by mouth every 8 (eight) hours as needed for nausea or vomiting.     oxyCODONE-acetaminophen 5-325 MG per tablet  Commonly known as:  PERCOCET/ROXICET  Take 1-2 tablets by mouth every 4 (four) hours as needed for moderate pain or severe pain.           Follow-up Information   Follow up with [facility] In 2 weeks.   Specialty:  Obstetrics and Gynecology   Contact information:   [place] [address] [phone]      Follow up with [place]. (As needed in emergencies)    Contact information:   [address] [id number] [place] [address] [phone]      Signed: Manya Silvas 07/03/2014, 2:45 PM

## 2014-07-03 NOTE — Progress Notes (Signed)
Discharge instructions complete. Pt understood all instructions and did not have any questions. Pt ambulated out of the hospital and discharged home to family.

## 2014-07-03 NOTE — Discharge Instructions (Signed)
Pelvic Inflammatory Disease Pelvic inflammatory disease (PID) refers to an infection in some or all of the female organs. The infection can be in the uterus, ovaries, fallopian tubes, or the surrounding tissues in the pelvis. PID can cause abdominal or pelvic pain that comes on suddenly (acute pelvic pain). PID is a serious infection because it can lead to lasting (chronic) pelvic pain or the inability to have children (infertile).  CAUSES  The infection is often caused by the normal bacteria found in the vaginal tissues. PID may also be caused by an infection that is spread during sexual contact. PID can also occur following:   The birth of a baby.   A miscarriage.   An abortion.   Major pelvic surgery.   The use of an intrauterine device (IUD).   A sexual assault.  RISK FACTORS Certain factors can put a person at higher risk for PID, such as:  Being younger than 25 years.  Being sexually active at [place] age.  Usingnonbarrier contraception.  Havingmultiple sexual partners.  Having sex with someone who has symptoms of a genital infection.  Using oral contraception. Other times, certain behaviors can increase the possibility of getting PID, such as:  Having sex during your period.  Using a vaginal douche.  Having an intrauterine device (IUD) in place. SYMPTOMS   Abdominal or pelvic pain.   Fever.   Chills.   Abnormal vaginal discharge.  Abnormal uterine bleeding.   Unusual pain shortly after finishing your period. DIAGNOSIS  Your caregiver will choose some of the following methods to make a diagnosis, such as:   Performinga physical exam and history. A pelvic exam typically reveals a very tender uterus and surrounding pelvis.   Ordering laboratory tests including a pregnancy test, blood tests, and urine test.  Orderingcultures of the vagina and cervix to check for a sexually transmitted infection (STI).  Performing an ultrasound.    Performing a laparoscopic procedure to look inside the pelvis.  TREATMENT   Antibiotic medicines may be prescribed and taken by mouth.   Sexual partners may be treated when the infection is caused by a sexually transmitted disease (STD).   Hospitalization may be needed to give antibiotics intravenously.  Surgery may be needed, but this is rare. It may take weeks until you are completely well. If you are diagnosed with PID, you should also be checked for human immunodeficiency virus (HIV). HOME CARE INSTRUCTIONS   If given, take your antibiotics as directed. Finish the medicine even if you start to feel better.   Only take over-the-counter or prescription medicines for pain, discomfort, or fever as directed by your caregiver.   Do not have sexual intercourse until treatment is completed or as directed by your caregiver. If PID is confirmed, your recent sexual partner(s) will need treatment.   Keep your follow-up appointments. SEEK MEDICAL CARE IF:   You have increased or abnormal vaginal discharge.   You need prescription medicine for your pain.   You vomit.   You cannot take your medicines.   Your partner has an STD.  SEEK IMMEDIATE MEDICAL CARE IF:   You have a fever.   You have increased abdominal or pelvic pain.   You have chills.   You have pain when you urinate.   You are not better after 72 hours following treatment.  MAKE SURE YOU:   Understand these instructions.  Will watch your condition.  Will get help right away if you are not doing well or get worse.  Document Released: 10/05/2005 Document Revised: 01/30/2013 Document Reviewed: 10/01/2011 [facility] Patient Information 2015 [place], [place]. This information is not intended to replace advice given to you by your health care provider. Make sure you discuss any questions you have with your health care provider.

## 2014-07-04 ENCOUNTER — Ambulatory Visit: Payer: Medicaid Other | Admitting: Internal Medicine

## 2014-07-05 NOTE — Discharge Summary (Signed)
Attestation of Attending Supervision of Advanced Practitioner (PA/CNM/NP): Evaluation and management procedures were performed by the Advanced Practitioner under my supervision and collaboration.  I have reviewed the Advanced Practitioner's note and chart, and I agree with the management and plan.  Jacob Stinson, DO Attending Physician Faculty Practice, [facility]   

## 2014-07-08 ENCOUNTER — Other Ambulatory Visit: Payer: Self-pay | Admitting: Advanced Practice Midwife

## 2014-07-08 DIAGNOSIS — N7093 Salpingitis and oophoritis, unspecified: Secondary | ICD-10-CM

## 2014-07-08 MED ORDER — KETOROLAC TROMETHAMINE 10 MG PO TABS
10.0000 mg | ORAL_TABLET | Freq: Four times a day (QID) | ORAL | Status: DC | PRN
Start: 2014-07-08 — End: 2014-07-08

## 2014-07-08 MED ORDER — KETOROLAC TROMETHAMINE 10 MG PO TABS: 10.0000 mg | ORAL_TABLET | Freq: Four times a day (QID) | ORAL | Status: DC | PRN

## 2014-07-08 MED ORDER — OXYCODONE-ACETAMINOPHEN 5-325 MG PO TABS: 1.0000 | ORAL_TABLET | ORAL | Status: DC | PRN

## 2014-07-08 NOTE — Progress Notes (Signed)
Requesting refill for percocet. D/C'd from [facility] Unit 07/03/14 for TOA. Will give one more and Rx Toradol. Has F/U appt 07/19/14 at [facility].

## 2014-07-12 ENCOUNTER — Ambulatory Visit: Payer: Medicaid Other | Attending: Family Medicine | Admitting: Family Medicine

## 2014-07-12 ENCOUNTER — Encounter: Payer: Self-pay | Admitting: Family Medicine

## 2014-07-12 VITALS — BP 126/58 | HR 62 | Temp 98.4°F | Resp 18 | Ht 61.0 in | Wt 155.0 lb

## 2014-07-12 DIAGNOSIS — K029 Dental caries, unspecified: Secondary | ICD-10-CM | POA: Diagnosis not present

## 2014-07-12 DIAGNOSIS — N7093 Salpingitis and oophoritis, unspecified: Secondary | ICD-10-CM | POA: Diagnosis not present

## 2014-07-12 MED ORDER — ONDANSETRON HCL 8 MG PO TABS: 4.0000 mg | ORAL_TABLET | Freq: Three times a day (TID) | ORAL | Status: DC | PRN

## 2014-07-12 NOTE — Assessment & Plan Note (Signed)
For dental pain with carries : I placed a referral to dentistry.

## 2014-07-12 NOTE — Progress Notes (Signed)
   Subjective:    Patient ID: Jessica Case, female    DOB: [birth year], 33 y.o.   MRN: [id number] CC: Establish care, ED follow for dental pain, hospital followup for tubo-ovarian abscess HPI 32 year old female presents with her fianc to establish care discussed the following:  #1 dental pain: Patient with right upper molar dental pain. Pain is improved in the past because of fear patient has been to the ED multiple times. Patient endorses eating a lot of candy. Patient is a Pharmacist, community in many years. Patient denies history of dental abscess.  #2 tubo-ovarian abscess: Patient hospitalized from 06/30/2014 to 07/03/2014 for tubo-ovarian abscess. She had leukocytosis on admission. She's afebrile throughout her hospital stay. She's been afebrile since being home. She reports her pain is well-controlled. She is taking Flagyl and doxycycline having some nausea and one episode of emesis. She reports that she did not get a refill for Zofran.  Soc hx:  Review of Systems As per HPI      Objective:   Physical Exam BP 126/58  Pulse 62  Temp(Src) 98.4 F (36.9 C) (Oral)  Resp 18  Ht 5\' 1"  (1.549 m)  Wt 155 lb (70.308 kg)  BMI 29.30 kg/m2  SpO2 100%  LMP 06/20/2014 General appearance: alert, cooperative and no distress Throat: abnormal findings: dentition: multiple carries Lungs: clear to auscultation bilaterally Heart: regular rate and rhythm, S1, S2 normal, no murmur, click, rub or gallop Abdomen: soft, flat, mild tenderness LLQ, no rebound or guarding        Assessment & Plan:

## 2014-07-12 NOTE — Patient Instructions (Addendum)
Ms. Erlene Senters,  Thank you for coming in today. It was a pleasure meeting you. I look forward to being your primary doctor.  1. For tuboovarian abscess: Finish antibiotics  Use zofran for nausea  Make and keep appt with gyn   2. For dental pain: I placed a referral to dentistry.   F/u in 4-6 weeks for complete physical we will address your feet at your physical.   Dr. Adrian Blackwater and in an

## 2014-07-12 NOTE — Assessment & Plan Note (Signed)
1. For tuboovarian abscess: Finish antibiotics  Use zofran for nausea  Make and keep appt with gyn

## 2014-07-12 NOTE — Progress Notes (Signed)
Establish Care HFU abscess, pt still with pain Pt stating having nausea when taking antibiotic

## 2014-07-19 ENCOUNTER — Encounter: Payer: Self-pay | Admitting: General Practice

## 2014-07-19 ENCOUNTER — Encounter: Payer: Medicaid Other | Admitting: Family Medicine

## 2014-07-19 ENCOUNTER — Telehealth: Payer: Self-pay | Admitting: General Practice

## 2014-07-19 NOTE — Telephone Encounter (Signed)
Called patient and a man answered stating she wasn't in but he would tell her we called. Will send letter

## 2014-07-25 ENCOUNTER — Encounter: Payer: Self-pay | Admitting: Obstetrics & Gynecology

## 2014-08-20 ENCOUNTER — Encounter: Payer: Self-pay | Admitting: Family Medicine

## 2014-08-30 ENCOUNTER — Ambulatory Visit (INDEPENDENT_AMBULATORY_CARE_PROVIDER_SITE_OTHER): Payer: Medicaid Other | Admitting: Obstetrics & Gynecology

## 2014-08-30 ENCOUNTER — Encounter: Payer: Self-pay | Admitting: Obstetrics & Gynecology

## 2014-08-30 ENCOUNTER — Other Ambulatory Visit (HOSPITAL_COMMUNITY)
Admission: RE | Admit: 2014-08-30 | Discharge: 2014-08-30 | Disposition: A | Discharge status: Home / Self Care | Payer: Medicaid Other | Source: Ambulatory Visit | Attending: Obstetrics & Gynecology | Admitting: Obstetrics & Gynecology

## 2014-08-30 VITALS — BP 127/86 | HR 96 | Temp 98.4°F | Ht 61.0 in | Wt 141.2 lb

## 2014-08-30 DIAGNOSIS — Z1151 Encounter for screening for human papillomavirus (HPV): Secondary | ICD-10-CM | POA: Diagnosis present

## 2014-08-30 DIAGNOSIS — Z01419 Encounter for gynecological examination (general) (routine) without abnormal findings: Secondary | ICD-10-CM | POA: Insufficient documentation

## 2014-08-30 DIAGNOSIS — Z124 Encounter for screening for malignant neoplasm of cervix: Secondary | ICD-10-CM

## 2014-08-30 DIAGNOSIS — N7093 Salpingitis and oophoritis, unspecified: Secondary | ICD-10-CM

## 2014-08-30 MED ORDER — CEFTRIAXONE SODIUM 250 MG IJ SOLR: 250.0000 mg | Freq: Once | INTRAMUSCULAR | Status: DC

## 2014-08-30 MED ORDER — OXYCODONE-ACETAMINOPHEN 5-325 MG PO TABS: 1.0000 | ORAL_TABLET | ORAL | Status: DC | PRN

## 2014-08-30 MED ORDER — DOXYCYCLINE HYCLATE 100 MG PO TABS: 100.0000 mg | ORAL_TABLET | Freq: Two times a day (BID) | ORAL | Status: DC

## 2014-08-30 MED ORDER — CEFTRIAXONE SODIUM 1 G IJ SOLR
250.0000 mg | Freq: Once | INTRAMUSCULAR | Status: AC
  Administered 2014-08-30: 250 mg via INTRAMUSCULAR

## 2014-08-30 MED ORDER — METRONIDAZOLE 500 MG PO TABS: 500.0000 mg | ORAL_TABLET | Freq: Two times a day (BID) | ORAL | Status: DC

## 2014-08-30 NOTE — Patient Instructions (Signed)
Pelvic Inflammatory Disease °Pelvic inflammatory disease (PID) refers to an infection in some or all of the female organs. The infection can be in the uterus, ovaries, fallopian tubes, or the surrounding tissues in the pelvis. PID can cause abdominal or pelvic pain that comes on suddenly (acute pelvic pain). PID is a serious infection because it can lead to lasting (chronic) pelvic pain or the inability to have children (infertile).  °CAUSES  °The infection is often caused by the normal bacteria found in the vaginal tissues. PID may also be caused by an infection that is spread during sexual contact. PID can also occur following:  °· The birth of a baby.   °· A miscarriage.   °· An abortion.   °· Major pelvic surgery.   °· The use of an intrauterine device (IUD).   °· A sexual assault.   °RISK FACTORS °Certain factors can put a person at higher risk for PID, such as: °· Being younger than 25 years. °· Being sexually active at a young age. °· Using nonbarrier contraception. °· Having multiple sexual partners. °· Having sex with someone who has symptoms of a genital infection. °· Using oral contraception. °Other times, certain behaviors can increase the possibility of getting PID, such as: °· Having sex during your period. °· Using a vaginal douche. °· Having an intrauterine device (IUD) in place. °SYMPTOMS  °· Abdominal or pelvic pain.   °· Fever.   °· Chills.   °· Abnormal vaginal discharge. °· Abnormal uterine bleeding.   °· Unusual pain shortly after finishing your period. °DIAGNOSIS  °Your caregiver will choose some of the following methods to make a diagnosis, such as:  °· Performing a physical exam and history. A pelvic exam typically reveals a very tender uterus and surrounding pelvis.   °· Ordering laboratory tests including a pregnancy test, blood tests, and urine test.  °· Ordering cultures of the vagina and cervix to check for a sexually transmitted infection (STI). °· Performing an ultrasound.    °· Performing a laparoscopic procedure to look inside the pelvis.   °TREATMENT  °· Antibiotic medicines may be prescribed and taken by mouth.   °· Sexual partners may be treated when the infection is caused by a sexually transmitted disease (STD).   °· Hospitalization may be needed to give antibiotics intravenously. °· Surgery may be needed, but this is rare. °It may take weeks until you are completely well. If you are diagnosed with PID, you should also be checked for human immunodeficiency virus (HIV).   °HOME CARE INSTRUCTIONS  °· If given, take your antibiotics as directed. Finish the medicine even if you start to feel better.   °· Only take over-the-counter or prescription medicines for pain, discomfort, or fever as directed by your caregiver.   °· Do not have sexual intercourse until treatment is completed or as directed by your caregiver. If PID is confirmed, your recent sexual partner(s) will need treatment.   °· Keep your follow-up appointments. °SEEK MEDICAL CARE IF:  °· You have increased or abnormal vaginal discharge.   °· You need prescription medicine for your pain.   °· You vomit.   °· You cannot take your medicines.   °· Your partner has an STD.   °SEEK IMMEDIATE MEDICAL CARE IF:  °· You have a fever.   °· You have increased abdominal or pelvic pain.   °· You have chills.   °· You have pain when you urinate.   °· You are not better after 72 hours following treatment.   °MAKE SURE YOU:  °· Understand these instructions. °· Will watch your condition. °· Will get help right away if you are not doing well or get worse. °  Document Released: 10/05/2005 Document Revised: 01/30/2013 Document Reviewed: 10/01/2011 °[employer]® Patient Information ©2015 [employer]. This information is not intended to replace advice given to you by your health care provider. Make sure you discuss any questions you have with your health care provider. ° °

## 2014-08-30 NOTE — Progress Notes (Signed)
Pt is suppose to be taking Trazodone/Risperidal but does not currently have a prescription.

## 2014-08-30 NOTE — Progress Notes (Signed)
Patient ID: Rajni Holsworth, female   DOB: 05-May-1981, 33 y.o.   MRN: [id number]  Chief Complaint  Patient presents with  . Follow-up    HPI Evia Goldsmith is a 33 y.o. female.  [id number] Patient's last menstrual period was 08/12/2014. She was hospitalized 06/2014 and was treated for left TOA but missed f/u appt 07/19/14. RLQ pain is increasing and not associated with menses. Felt better for 2 weeks after inpatient.   HPI  No past medical history on file.  Past Surgical History  Procedure Laterality Date  . Tooth extraction      Family History  Problem Relation Age of Onset  . Hypertension Mother     Social History History  Substance Use Topics  . Smoking status: Current Every Day Smoker -- 0.10 packs/day    Types: Cigarettes  . Smokeless tobacco: Never Used  . Alcohol Use: Yes     Comment: occ    Allergies  Allergen Reactions  . Citrus Itching  . Strawberry Hives    Current Outpatient Prescriptions  Medication Sig Dispense Refill  . cefTRIAXone (ROCEPHIN) 250 MG injection Inject 250 mg into the muscle once.  FOR IM use in LARGE MUSCLE MASS 1 each 0  . doxycycline (VIBRA-TABS) 100 MG tablet Take 1 tablet (100 mg total) by mouth 2 (two) times daily. 28 tablet 0  . fluconazole (DIFLUCAN) 150 MG tablet Take 1 tablet (150 mg total) by mouth every 3 (three) days as needed (for yeast infection). 2 tablet 0  . ibuprofen (ADVIL,MOTRIN) 800 MG tablet Take 800 mg by mouth every 8 (eight) hours as needed.    . metroNIDAZOLE (FLAGYL) 500 MG tablet Take 1 tablet (500 mg total) by mouth 2 (two) times daily. 14 tablet 0  . ondansetron (ZOFRAN) 8 MG tablet Take 0.5 tablets (4 mg total) by mouth every 8 (eight) hours as needed for nausea or vomiting. 20 tablet 0  . oxyCODONE-acetaminophen (PERCOCET/ROXICET) 5-325 MG per tablet Take 1-2 tablets by mouth every 4 (four) hours as needed for severe pain. 20 tablet 0   No current facility-administered medications for this visit.     Review of Systems Review of Systems  Constitutional: Negative.   Genitourinary: Positive for vaginal discharge and pelvic pain. Negative for dysuria, urgency, vaginal bleeding and menstrual problem.    Blood pressure 127/86, pulse 96, temperature 98.4 F (36.9 C), height 5\' 1"  (1.549 m), weight 64.048 kg (141 lb 3.2 oz), last menstrual period 08/12/2014.  Physical Exam Physical Exam  Constitutional: She is oriented to person, place, and time. She appears well-developed. She appears distressed.  Neck: Normal range of motion.  Pulmonary/Chest: Effort normal.  Abdominal: Soft. She exhibits no distension and no mass. There is tenderness (right mild). There is no rebound and no guarding.  Genitourinary: Uterus normal. Vaginal discharge (wet prep) found.  CMT and right adnexal tenderness no mass  Neurological: She is alert and oriented to person, place, and time.  Skin: Skin is warm.  Psychiatric:  Urinated on floor in exam room    Data Reviewed   CLINICAL DATA: Abdominal pain. Tenderness to palpation at both lower quadrants. Nausea and vomiting.  EXAM: CT ABDOMEN AND PELVIS WITH CONTRAST  TECHNIQUE: Multidetector CT imaging of the abdomen and pelvis was performed using the standard protocol following bolus administration of intravenous contrast.  CONTRAST: 100 mL of Omnipaque 300 IV contrast  COMPARISON: Abdominal radiograph performed 05/15/2011  FINDINGS: Minimal bibasilar atelectasis or scarring is noted.  The liver and spleen are  unremarkable in appearance. The gallbladder is within normal limits. The pancreas and adrenal glands are unremarkable.  The kidneys are unremarkable in appearance. There is no evidence of hydronephrosis. No renal or ureteral stones are seen. No perinephric stranding is appreciated.  Trace free fluid within the pelvis is likely physiologic in nature. The small bowel is unremarkable in appearance. The stomach is  within normal limits. No acute vascular abnormalities are seen.  The appendix is normal in caliber, without evidence for appendicitis. The colon is unremarkable in appearance.  The bladder is mildly distended and grossly unremarkable. The uterus is grossly unremarkable. A fluid-filled tubular structure is noted at the left adnexa, which may reflect hydrosalpinx or pyosalpinx. The left ovary is difficult to fully assess; a tubo-ovarian abscess cannot be excluded. No inguinal lymphadenopathy is seen.  No acute osseous abnormalities are identified.  IMPRESSION: Fluid-filled tubular structure at the left adnexa may reflect hydrosalpinx or pyosalpinx. The left ovary is not well characterized; a tubo-ovarian abscess cannot be excluded. Would correlate for associated symptoms and signs of infection.   Electronically Signed  By: Garald Balding M.D.  On: 06/30/2014 03:51     Assessment    Possible recurrent PID, TOA     Plan    Rocephin 250 mg IM and flagyl and doxycycline Percocet prn    Repeat US RTC ~1 wk   Essense Bousquet 08/30/2014, 2:54 PM

## 2014-08-31 ENCOUNTER — Ambulatory Visit (HOSPITAL_COMMUNITY)
Admission: RE | Admit: 2014-08-31 | Discharge: 2014-08-31 | Disposition: A | Discharge status: Home / Self Care | Payer: Medicaid Other | Source: Ambulatory Visit | Attending: Obstetrics & Gynecology | Admitting: Obstetrics & Gynecology

## 2014-08-31 DIAGNOSIS — N7093 Salpingitis and oophoritis, unspecified: Secondary | ICD-10-CM | POA: Diagnosis not present

## 2014-08-31 DIAGNOSIS — D259 Leiomyoma of uterus, unspecified: Secondary | ICD-10-CM | POA: Insufficient documentation

## 2014-08-31 LAB — WET PREP, GENITAL
TRICH WET PREP: NONE SEEN
Yeast Wet Prep HPF POC: NONE SEEN

## 2014-09-03 ENCOUNTER — Ambulatory Visit: Payer: Medicaid Other | Attending: Family Medicine | Admitting: Family Medicine

## 2014-09-03 ENCOUNTER — Encounter: Payer: Self-pay | Admitting: Family Medicine

## 2014-09-03 VITALS — BP 126/82 | HR 71 | Temp 98.5°F | Resp 16 | Ht 61.0 in | Wt 146.0 lb

## 2014-09-03 DIAGNOSIS — N7093 Salpingitis and oophoritis, unspecified: Secondary | ICD-10-CM

## 2014-09-03 DIAGNOSIS — R143 Flatulence: Secondary | ICD-10-CM

## 2014-09-03 LAB — CYTOLOGY - PAP

## 2014-09-03 MED ORDER — POLYETHYLENE GLYCOL 3350 17 GM/SCOOP PO POWD
17.0000 g | Freq: Every day | ORAL | Status: DC
Start: 2014-09-03 — End: 2014-11-15

## 2014-09-03 NOTE — Progress Notes (Signed)
F/U  Cyst on fallopian tubes Requested [place] results Stated has no pain only pressure

## 2014-09-03 NOTE — Progress Notes (Signed)
   Subjective:    Patient ID: Jessica Case, female    DOB: 08-09-1981, 33 y.o.   MRN: [id number] CC: f/u tuboovarian abscess  HPI  33 yo F presents for f/u appt  1. Tuboovarian abscess: patient with RLQ "pressure". No pain, fever, vaginal discharge. Was treated last week with rocephin IM x 1 and doxycycline for possible recurrence of PID.  A f/u TVUS was ordered.   2. Gas: increased gas x 5 months. With mild constipation. No nausea or emesis. Patient does eat a lot of cheese.   Soc hx: current smoker  Review of Systems As per HPI     Objective:   Physical Exam BP 126/82 mmHg  Pulse 71  Temp(Src) 98.5 F (36.9 C) (Oral)  Resp 16  Ht 5\' 1"  (1.549 m)  Wt 146 lb (66.225 kg)  BMI 27.60 kg/m2  SpO2 100%  LMP 08/12/2014 General appearance: alert, cooperative and no distress Abdomen: soft, non-tender; bowel sounds normal; no masses,  no organomegaly  08/31/14: Transvaginal ultrasound:  1. Uterine fibroids. 2. Bilateral adnexal abnormalities likely representing hydrosalpinx or pyosalpinx. The appearance is probably little changed since September 2015. 3. Given the lack of change, consider further evaluation with MRI    Assessment & Plan:

## 2014-09-03 NOTE — Assessment & Plan Note (Signed)
A: ? Recurrence, repeat TVUS with no change from last. Patient saw her gynecologist who is retreating for possible recurrent PID.  P: ordered f/u pelvic MRI  Patient to finish doxy F/u with Dr. Roselie Awkward Added stool softener to regimen

## 2014-09-03 NOTE — Patient Instructions (Signed)
Ms. Slagel,  Thank you for coming back in today.  1. R pelvic pressure: finish doxycycline. Use percocet sparingly. Take tylenol or ibuprofen for pressure/discomfort. Start colace to soften stool.   Please go for scheduled pelvic MRI on  Friday September 21, 2014 at 4 PM, arrive at 3:45 PM at [facility].  Please keep follow up with Dr. Roselie Awkward   2. Gas: limit lactose, milk, cheese, yogurt.   Dr. Adrian Blackwater

## 2014-09-03 NOTE — Assessment & Plan Note (Signed)
A: possible lactose intolerance P: Avoid lactose

## 2014-09-07 ENCOUNTER — Ambulatory Visit (INDEPENDENT_AMBULATORY_CARE_PROVIDER_SITE_OTHER): Payer: Medicaid Other | Admitting: Obstetrics & Gynecology

## 2014-09-07 ENCOUNTER — Encounter: Payer: Self-pay | Admitting: Obstetrics & Gynecology

## 2014-09-07 VITALS — BP 119/77 | HR 78 | Temp 98.1°F | Ht 62.0 in | Wt 145.2 lb

## 2014-09-07 DIAGNOSIS — N7093 Salpingitis and oophoritis, unspecified: Secondary | ICD-10-CM

## 2014-09-07 NOTE — Patient Instructions (Signed)
Infertility WHAT IS INFERTILITY?  Infertility is usually defined as not being able to get pregnant after trying for one year of regular sexual intercourse without the use of contraceptives. Or not being able to carry a pregnancy to term and have a baby. The infertility rate in the [place] is around 10%. Pregnancy is the result of a chain of events. A woman must release an egg from one of her ovaries (ovulation). The egg must be fertilized by the female sperm. Then it travels through a fallopian tube into the uterus (womb), where it attaches to the wall of the uterus and grows. A man must have enough sperm, and the sperm must join with (fertilize) the egg along the way, at the proper time. The fertilized egg must then become attached to the inside of the uterus. While this may seem simple, many things can happen to prevent pregnancy from occurring.  WHOSE PROBLEM IS IT?  About 20% of infertility cases are due to problems with the man (female factors) and 65% are due to problems with the woman (female factors). Other cases are due to a combination of female and female factors or to unknown causes.  WHAT CAUSES INFERTILITY IN MEN?  Infertility in men is often caused by problems with making enough normal sperm or getting the sperm to reach the egg. Problems with sperm may exist from birth or develop later in life, due to illness or injury. Some men produce no sperm, or produce too few sperm (oligospermia). Other problems include:  Sexual dysfunction.  Hormonal or endocrine problems.  Age. Female fertility decreases with age, but not at as young an age as female fertility.  Infection.  Congenital problems. Birth defect, such as absence of the tubes that carry the sperm (vas deferens).  Genetic/chromosomal problems.  Antisperm antibody problems.  Retrograde ejaculation (sperm go into the bladder).  Varicoceles, spematoceles, or tumors of the testicles.  Lifestyle can influence the number and  quality of a man's sperm.  Alcohol and drugs can temporarily reduce sperm quality.  Environmental toxins, including pesticides and lead, may cause some cases of infertility in men. WHAT CAUSES INFERTILITY IN WOMEN?   Problems with ovulation account for most infertility in women. Without ovulation, eggs are not available to be fertilized.  Signs of problems with ovulation include irregular menstrual periods or no periods at all.  Simple lifestyle factors, including stress, diet, or athletic training, can affect a woman's hormonal balance.  Age. Fertility begins to decrease in women in the early 76s and is worse after age 71.  Much less often, a hormonal imbalance from a serious medical problem, such as a pituitary gland tumor, thyroid or other chronic medical disease, can cause ovulation problems.  Pelvic infections.  Polycystic ovary syndrome (increase in female hormones, unable to ovulate).  Alcohol or illegal drugs.  Environmental toxins, radiation, pesticides, and certain chemicals.  Aging is an important factor in female infertility.  The ability of a woman's ovaries to produce eggs declines with age, especially after age 27. About one third of couples where the woman is over 21 will have problems with fertility.  By the time she reaches menopause when her monthly periods stop for good, a woman can no longer produce eggs or become pregnant.  Other problems can also lead to infertility in women. If the fallopian tubes are blocked at one or both ends, the egg cannot travel through the tubes into the uterus. Scar tissue (adhesions) in the pelvis may cause blocked  tubes. This may result from pelvic inflammatory disease, endometriosis, or surgery for an ectopic pregnancy (fertilized egg implanted outside the uterus) or any pelvic or abdominal surgery causing adhesions.  Fibroid tumors or polyps of the uterus.  Congenital (birth defect) abnormalities of the uterus.  Infection of the  cervix (cervicitis).  Cervical stenosis (narrowing).  Abnormal cervical mucus.  Polycystic ovary syndrome.  Having sexual intercourse too often (every other day or 4 to 5 times a week).  Obesity.  Anorexia.  Poor nutrition.  Over exercising, with loss of body fat.  DES. Your mother received diethylstilbesterol hormone when pregnant with you. HOW IS INFERTILITY TESTED?  If you have been trying to have a baby without success, you may want to seek medical help. You should not wait for one year of trying before seeing a health care provider if:  You are over 35.  You have reason to believe that there may be a fertility problem. A medical evaluation may determine the reasons for a couple's infertility. Usually this process begins with:  Physical exams.  Medical histories of both partners.  Sexual histories of both partners. If there is no obvious problem, like improperly timed intercourse or absence of ovulation, tests may be needed.   For a man, testing usually begins with tests of his semen to look at:  The number of sperm.  The shape of sperm.  Movement of his sperm.  Taking a complete medical and surgical history.  Physical examination.  Check for infection of the female reproductive organs. Sometimes hormone tests are done.   For a woman, the first step in testing is to find out if she is ovulating each month. There are several ways to do this. For example, she can keep track of changes in her morning body temperature and in the texture of her cervical mucus. Another tool is a home ovulation test kit, which can be bought at drug or grocery stores.  Checks of ovulation can also be done in the doctor's office, using blood tests for hormone levels or ultrasound tests of the ovaries. If the woman is ovulating, more tests will need to be done. Some common female tests include:  Hysterosalpingogram: An x-ray of the fallopian tubes and uterus after they are injected with  dye. It shows if the tubes are open and shows the shape of the uterus.  Laparoscopy: An exam of the tubes and other female organs for disease. A lighted tube called a laparoscope is used to see inside the abdomen.  Endometrial biopsy: Sample of uterus tissue taken on the first day of the menstrual period, to see if the tissue indicates you are ovulating.  Transvaginal ultrasound: Examines the female organs.  Hysteroscopy: Uses a lighted tube to examine the cervix and inside the uterus, to see if there are any abnormalities inside the uterus. TREATMENT  Depending on the test results, different treatments can be suggested. The type of treatment depends on the cause. 85 to 90% of infertility cases are treated with drugs or surgery.   Various fertility drugs may be used for women with ovulation problems. It is important to talk with your caregiver about the drug to be used. You should understand the drug's benefits and side effects. Depending on the type of fertility drug and the dosage of the drug used, multiple births (twins or multiples) can occur in some women.  If needed, surgery can be done to repair damage to a woman's ovaries, fallopian tubes, cervix, or uterus.  Surgery  or medical treatment for endometriosis or polycystic ovary syndrome. Sometimes a man has an infertility problem that can be corrected with medicine or by surgery.  Intrauterine insemination (IUI) of sperm, timed with ovulation.  Change in lifestyle, if that is the cause (lose weight, increase exercise, and stop smoking, drinking excessively, or taking illegal drugs).  Other types of surgery:  Removing growths inside and on the uterus.  Removing scar tissue from inside of the uterus.  Fixing blocked tubes.  Removing scar tissue in the pelvis and around the female organs. WHAT IS ASSISTED REPRODUCTIVE TECHNOLOGY (ART)?  Assisted reproductive technology (ART) is another form of special methods used to help infertile  couples. ART involves handling both the woman's eggs and the man's sperm. Success rates vary and depend on many factors. ART can be expensive and time-consuming. But ART has made it possible for many couples to have children that otherwise would not have been conceived. Some methods are listed below:  In vitro fertilization (IVF). IVF is often used when a woman's fallopian tubes are blocked or when a man has low sperm counts. A drug is used to stimulate the ovaries to produce multiple eggs. Once mature, the eggs are removed and placed in a culture dish with the man's sperm for fertilization. After about 40 hours, the eggs are examined to see if they have become fertilized by the sperm and are dividing into cells. These fertilized eggs (embryos) are then placed in the woman's uterus. This bypasses the fallopian tubes.  Gamete intrafallopian transfer (GIFT) is similar to IVF, but used when the woman has at least one normal fallopian tube. Three to five eggs are placed in the fallopian tube, along with the man's sperm, for fertilization inside the woman's body.  Zygote intrafallopian transfer (ZIFT), also called tubal embryo transfer, combines IVF and GIFT. The eggs retrieved from the woman's ovaries are fertilized in the lab and placed in the fallopian tubes rather than in the uterus.  ART procedures sometimes involve the use of donor eggs (eggs from another woman) or previously frozen embryos. Donor eggs may be used if a woman has impaired ovaries or has a genetic disease that could be passed on to her baby.  When performing ART, you are at higher risk for resulting in multiple pregnancies, twins, triplets or more.  Intracytoplasma sperm injection is a procedure that injects a single sperm into the egg to fertilize it.  Embryo transplant is a procedure that starts after growing an embryo in a special media (chemical solution) developed to keep the embryo alive for 2 to 5 days, and then transplanting it  into the uterus. In cases where a cause cannot be found and pregnancy does not occur, adoption may be a consideration. Document Released: 10/08/2003 Document Revised: 12/28/2011 Document Reviewed: 09/03/2009 [facility] Patient Information 2015 [place], [place]. This information is not intended to replace advice given to you by your health care provider. Make sure you discuss any questions you have with your health care provider.

## 2014-09-07 NOTE — Progress Notes (Signed)
Subjective:     Patient ID: Jessica Case, female   DOB: 07-30-1981, 33 y.o.   MRN: [id number]  [id number] Patient's last menstrual period was 08/12/2014. After treatment for PID with possible TOA vs hydrosalpinx there no pain, only some pressure with urination. No fever, discharge, bleeding   Review of Systems     Objective:   Physical Exam  Constitutional: She is oriented to person, place, and time. She appears well-developed. No distress.  Pulmonary/Chest: Effort normal.  Abdominal: Soft. She exhibits no distension and no mass. There is no tenderness.  Neurological: She is alert and oriented to person, place, and time.  Skin: Skin is warm and dry.       Assessment:     PID good response to Tx. States trying to conceive     Plan:     Report if recurrent sx.   Woodroe Mode, MD 09/07/2014

## 2014-09-17 ENCOUNTER — Telehealth: Payer: Self-pay

## 2014-09-17 NOTE — Telephone Encounter (Signed)
Patient's pap results negative with +HPV. Discussed with Dr. Roselie Awkward, most recent guidelines state patient should return in 1 year for pap with HPV testing. No colpo needed at this time. Attempted to contact patient. Family member answered and stated she was not in but that he would have her call when she gets home. Patient needs to be informed of pap results and recommendation to return for repeat pap in 1 year with HPV testing. Message sent to admin pool to cancel colpo appointment.

## 2014-09-17 NOTE — Telephone Encounter (Signed)
-----   Message from [place] sent at 09/17/2014  3:50 PM EST ----- Scheduled patient for colpo 11/02/14 at 9:30

## 2014-09-18 NOTE — Telephone Encounter (Signed)
Pt called clinic for results, results given, no additional testing needed at this time.  Pt verbalizes understanding.

## 2014-09-21 ENCOUNTER — Ambulatory Visit (HOSPITAL_COMMUNITY): Admission: RE | Admit: 2014-09-21 | Payer: Medicaid Other | Source: Ambulatory Visit

## 2014-09-21 ENCOUNTER — Ambulatory Visit (HOSPITAL_COMMUNITY)
Admission: RE | Admit: 2014-09-21 | Discharge: 2014-09-21 | Disposition: A | Discharge status: Home / Self Care | Payer: Medicaid Other | Source: Ambulatory Visit | Attending: Family Medicine | Admitting: Family Medicine

## 2014-09-21 DIAGNOSIS — N7093 Salpingitis and oophoritis, unspecified: Secondary | ICD-10-CM

## 2014-10-04 ENCOUNTER — Ambulatory Visit: Payer: Medicaid Other | Attending: Family Medicine | Admitting: Family Medicine

## 2014-10-04 ENCOUNTER — Encounter: Payer: Self-pay | Admitting: Family Medicine

## 2014-10-04 VITALS — BP 137/86 | HR 83 | Temp 98.5°F | Resp 18 | Ht 61.0 in | Wt 147.0 lb

## 2014-10-04 DIAGNOSIS — F17200 Nicotine dependence, unspecified, uncomplicated: Secondary | ICD-10-CM | POA: Insufficient documentation

## 2014-10-04 DIAGNOSIS — K029 Dental caries, unspecified: Secondary | ICD-10-CM | POA: Diagnosis not present

## 2014-10-04 DIAGNOSIS — N7093 Salpingitis and oophoritis, unspecified: Secondary | ICD-10-CM | POA: Diagnosis not present

## 2014-10-04 MED ORDER — NAPROXEN 500 MG PO TABS: 500.0000 mg | ORAL_TABLET | Freq: Three times a day (TID) | ORAL | Status: DC | PRN

## 2014-10-04 NOTE — Progress Notes (Signed)
   Subjective:    Patient ID: Jessica Case, female    DOB: 1981/04/25, 33 y.o.   MRN: [id number] CC: tooth ache, needs MRI rescheduled for f/u recent tuboovarian abscess   HPI 33 yo F presents for f/u visit:  1. Needs f/u MRI rescheduled: patient went for f/u pelvic MRI for tuboovarian abscess. She could not tolerate the closed MRI due to claustrophobia.   2. Dental pain: has intermittent L lower molar pain for many months. Comes and goes. No swelling or fever. She is taking BC powders prn pain.   Soc Hx: current smoker  Review of Systems As per HPI     Objective:   Physical Exam BP 137/86 mmHg  Pulse 83  Temp(Src) 98.5 F (36.9 C) (Oral)  Resp 18  Ht 5\' 1"  (1.549 m)  Wt 147 lb (66.679 kg)  BMI 27.79 kg/m2  SpO2 98%  LMP 09/10/2014 General appearance: alert, cooperative and no distress Throat: normal findings: lips normal without lesions, buccal mucosa normal, gums healthy, palate normal and oropharynx pink & moist without lesions or evidence of thrush and abnormal findings: dentition: fair, no abscess or swelling      Assessment & Plan:

## 2014-10-04 NOTE — Progress Notes (Signed)
Complaining with tooth ache Need MRI reschedule Stated still with gas, no changes since last visit

## 2014-10-04 NOTE — Assessment & Plan Note (Signed)
A: doing well. P: F/u MRI pending.  Needs prior authorization for open MRI.

## 2014-10-04 NOTE — Patient Instructions (Addendum)
Ms. Switalski,  Thank you for coming back in today.  I prescribed naproxen for tooth pain.  We will need to get prior authorization for pelvic MRI.   Dr. Adrian Blackwater   Flatulence There are good germs in your gut to help you digest food. Gas is produced by these germs and released from your bottom. Most people release 3 to 4 quarts of gas every day. This is normal. HOME CARE  Eat or drink less of the foods or liquids that give you gas.  Take the time to chew your food well. Talk less while you eat.  Do not suck on ice or hard candy.  Sip slowly. Stir some of the bubbles out of fizzy drinks with a spoon or straw.  Avoid chewing gum or smoking.  Ask your doctor about liquids and tablets that may help control burping and gas.  Only take medicine as told by your doctor. GET HELP RIGHT AWAY IF:   There is discomfort when you burp or pass gas.  You throw up (vomit) when you burp.  Poop (stool) comes out when you pass gas.  Your belly is puffy (swollen) and hard. MAKE SURE YOU:   Understand these instructions.  Will watch your condition.  Will get help right away if you are not doing well or get worse. Document Released: 08/07/2008 Document Revised: 12/28/2011 Document Reviewed: 08/07/2008 [facility] Patient Information 2015 [place], [place]. This information is not intended to replace advice given to you by your health care provider. Make sure you discuss any questions you have with your health care provider.

## 2014-10-04 NOTE — Assessment & Plan Note (Signed)
A; L lower 2nd molar pain. No abscess. P: Patient provide with list of dentist accepting medicaid. NSAID prescribed.

## 2014-10-05 NOTE — Progress Notes (Signed)
Insurance authorization obtained from [employer] for open MRI of pelvis with and without contrast. Authorization # [id number], effective 10/05/14-11/04/14.  Patient's open MRI already scheduled for 10/17/12 at 2000 at [place].  Facility updated with authorization number.

## 2014-10-17 ENCOUNTER — Ambulatory Visit
Admission: RE | Admit: 2014-10-17 | Discharge: 2014-10-17 | Disposition: A | Discharge status: Home / Self Care | Payer: Medicaid Other | Source: Ambulatory Visit | Attending: Family Medicine | Admitting: Family Medicine

## 2014-10-17 ENCOUNTER — Ambulatory Visit: Admission: RE | Admit: 2014-10-17 | Payer: Medicaid Other | Source: Ambulatory Visit

## 2014-10-17 ENCOUNTER — Other Ambulatory Visit: Payer: Self-pay | Admitting: Family Medicine

## 2014-10-17 DIAGNOSIS — N7093 Salpingitis and oophoritis, unspecified: Secondary | ICD-10-CM

## 2014-10-17 MED ORDER — GADOBENATE DIMEGLUMINE 529 MG/ML IV SOLN
13.0000 mL | Freq: Once | INTRAVENOUS | Status: AC | PRN
  Administered 2014-10-17: 13 mL via INTRAVENOUS

## 2014-10-23 ENCOUNTER — Telehealth: Payer: Self-pay | Admitting: Family Medicine

## 2014-10-23 NOTE — Telephone Encounter (Signed)
Pt requesting MRI results Please post  Ty

## 2014-10-23 NOTE — Telephone Encounter (Signed)
Patient presents to clinic to receive MRI results. Please contact patient.

## 2014-10-24 NOTE — Telephone Encounter (Signed)
MRI reviewed. See result note

## 2014-10-29 ENCOUNTER — Telehealth: Payer: Self-pay | Admitting: *Deleted

## 2014-10-29 NOTE — Telephone Encounter (Signed)
Left message with female to return call

## 2014-10-29 NOTE — Telephone Encounter (Signed)
Pt called back, MRI results was given Advised to make F/U apt with Dr Roselie Awkward

## 2014-10-29 NOTE — Telephone Encounter (Signed)
-----   Message from Minerva Ends, MD sent at 10/24/2014  9:41 AM EST ----- MRI negative for persistent abscess. L fallopian tube is enlarged, blocked and has fluid in it= hydrosalpinx.  This condition may or may not be painful, if painful to the patient, removal of the L fallopian tube is possible. She will have to f/u with Dr. Roselie Awkward to discuss.

## 2014-11-02 ENCOUNTER — Encounter: Payer: Medicaid Other | Admitting: Family Medicine

## 2014-11-09 ENCOUNTER — Ambulatory Visit: Payer: Medicaid Other | Admitting: Family Medicine

## 2014-11-15 ENCOUNTER — Encounter: Payer: Self-pay | Admitting: Family Medicine

## 2014-11-15 ENCOUNTER — Ambulatory Visit: Payer: Medicaid Other | Attending: Family Medicine | Admitting: Family Medicine

## 2014-11-15 VITALS — BP 128/88 | HR 56 | Temp 98.6°F | Resp 16 | Ht 61.0 in | Wt 147.0 lb

## 2014-11-15 DIAGNOSIS — M7989 Other specified soft tissue disorders: Secondary | ICD-10-CM | POA: Diagnosis not present

## 2014-11-15 DIAGNOSIS — M21619 Bunion of unspecified foot: Secondary | ICD-10-CM | POA: Insufficient documentation

## 2014-11-15 DIAGNOSIS — K029 Dental caries, unspecified: Secondary | ICD-10-CM

## 2014-11-15 DIAGNOSIS — R143 Flatulence: Secondary | ICD-10-CM

## 2014-11-15 DIAGNOSIS — R202 Paresthesia of skin: Secondary | ICD-10-CM | POA: Diagnosis not present

## 2014-11-15 DIAGNOSIS — R140 Abdominal distension (gaseous): Secondary | ICD-10-CM | POA: Diagnosis not present

## 2014-11-15 DIAGNOSIS — M2010 Hallux valgus (acquired), unspecified foot: Secondary | ICD-10-CM

## 2014-11-15 DIAGNOSIS — Z139 Encounter for screening, unspecified: Secondary | ICD-10-CM

## 2014-11-15 LAB — POCT GLYCOSYLATED HEMOGLOBIN (HGB A1C): Hemoglobin A1C: 5

## 2014-11-15 LAB — URIC ACID: Uric Acid, Serum: 4.1 mg/dL (ref 2.4–7.0)

## 2014-11-15 MED ORDER — NAPROXEN 500 MG PO TABS: 500.0000 mg | ORAL_TABLET | Freq: Two times a day (BID) | ORAL | Status: DC | PRN

## 2014-11-15 MED ORDER — BEANO MELTAWAYS PO TBDP: 2.0000 | ORAL_TABLET | Freq: Three times a day (TID) | ORAL | Status: DC

## 2014-11-15 NOTE — Assessment & Plan Note (Signed)
A: x 18 months, worsened after antibiotic treatment for tuboovarian abscess. No improvement with miralax or decreasing lactose intake. Distressing to patient.  P: Recommended beano  GI referral

## 2014-11-15 NOTE — Progress Notes (Signed)
Pt comes in for referral- Right foot bunion Requesting GI referral due to increased flatus Denies vomiting or constipation LMP- 10/20/14

## 2014-11-15 NOTE — Progress Notes (Signed)
   Subjective:    Patient ID: Jessica Case, female    DOB: Jun 17, 1981, 34 y.o.   MRN: [id number] CC: request screening for diabetes, referral to GI and referral to podiatry  HPI 34 yo F same-day visit:  1. R MTP swelling: x 12 years. Last year causing pain off and on. Swelling and redness. Toe is cracking. Feeling warm. Would like referral to podiatry.   2.  Excessive gas: x 18 months. Excessive gas throughout the day. Miralax has made the smell worse. No constipation or blood in stool. Stool. No weight loss.    3. Request screening for diabetes: foot tingling comes and goes.    Soc Hx: current smoker  Review of Systems As per HPI     Objective:   Physical Exam BP 128/88 mmHg  Pulse 56  Temp(Src) 98.6 F (37 C) (Oral)  Resp 16  Ht 5\' 1"  (1.549 m)  Wt 147 lb (66.679 kg)  BMI 27.79 kg/m2  SpO2 99%  LMP 10/20/2014 General appearance: alert, cooperative and no distress Throat: lips, mucosa, and tongue normal; teeth and gums normal Lungs: normal WOB Abdomen: soft, non-tender; bowel sounds normal; no masses,  no organomegaly Extremities: swollen and slightly erythemtaous 1st MTP joint b/l R>L. slightly tender on R. No warmth.    Lab Results  Component Value Date   HGBA1C 5.0 11/15/2014        Assessment & Plan:

## 2014-11-15 NOTE — Patient Instructions (Addendum)
Ms. Rosier,,  Thank you for coming in today.   1. Screening for diabetes- your screening test is negative  2.  Toe pain: bunions with possible gout Plan: Podiatry referral Uric acid Go for x-rays Naproxen as needed for pain flares   3. Gas: Stop miralax if not helping Try beano if you have not already. GI referral placed.   F/u in 3 months or sooner if needed for toes and gas.   Dr. Adrian Blackwater

## 2014-11-15 NOTE — Assessment & Plan Note (Signed)
A: bunion in both feet, worse on the R with intermittent swelling and redness. P: Uric acid level  X-ray of joint Referral to podiatry  Diclofenac as needed for pain

## 2014-11-20 ENCOUNTER — Telehealth: Payer: Self-pay | Admitting: *Deleted

## 2014-11-20 NOTE — Telephone Encounter (Signed)
Left lab results with husband

## 2014-11-20 NOTE — Telephone Encounter (Signed)
-----   Message from Minerva Ends, MD sent at 11/16/2014  8:53 AM EST ----- Normal uric acid level, gout in big toes is unlikely.

## 2014-11-30 ENCOUNTER — Encounter: Payer: Self-pay | Admitting: Obstetrics & Gynecology

## 2014-11-30 ENCOUNTER — Ambulatory Visit (INDEPENDENT_AMBULATORY_CARE_PROVIDER_SITE_OTHER): Payer: Medicaid Other | Admitting: Obstetrics & Gynecology

## 2014-11-30 VITALS — BP 125/67 | HR 57 | Temp 98.2°F | Wt 149.4 lb

## 2014-11-30 DIAGNOSIS — N7011 Chronic salpingitis: Secondary | ICD-10-CM

## 2014-11-30 DIAGNOSIS — N7093 Salpingitis and oophoritis, unspecified: Secondary | ICD-10-CM

## 2014-11-30 LAB — POCT URINALYSIS DIP (DEVICE)
BILIRUBIN URINE: NEGATIVE
GLUCOSE, UA: NEGATIVE mg/dL
Ketones, ur: NEGATIVE mg/dL
Leukocytes, UA: NEGATIVE
NITRITE: NEGATIVE
Protein, ur: NEGATIVE mg/dL
SPECIFIC GRAVITY, URINE: 1.01 (ref 1.005–1.030)
Urobilinogen, UA: 0.2 mg/dL (ref 0.0–1.0)
pH: 5.5 (ref 5.0–8.0)

## 2014-11-30 MED ORDER — CEFTRIAXONE SODIUM 250 MG IJ SOLR: 250.0000 mg | Freq: Once | INTRAMUSCULAR | Status: DC

## 2014-11-30 MED ORDER — TRAMADOL-ACETAMINOPHEN 37.5-325 MG PO TABS: 1.0000 | ORAL_TABLET | Freq: Four times a day (QID) | ORAL | Status: DC | PRN

## 2014-11-30 MED ORDER — CEFTRIAXONE SODIUM 1 G IJ SOLR
250.0000 mg | Freq: Once | INTRAMUSCULAR | Status: AC
  Administered 2014-11-30: 250 mg via INTRAMUSCULAR

## 2014-11-30 MED ORDER — DOXYCYCLINE HYCLATE 100 MG PO CAPS: 100.0000 mg | ORAL_CAPSULE | Freq: Two times a day (BID) | ORAL | Status: DC

## 2014-11-30 NOTE — Patient Instructions (Signed)
Pelvic Inflammatory Disease °Pelvic inflammatory disease (PID) refers to an infection in some or all of the female organs. The infection can be in the uterus, ovaries, fallopian tubes, or the surrounding tissues in the pelvis. PID can cause abdominal or pelvic pain that comes on suddenly (acute pelvic pain). PID is a serious infection because it can lead to lasting (chronic) pelvic pain or the inability to have children (infertile).  °CAUSES  °The infection is often caused by the normal bacteria found in the vaginal tissues. PID may also be caused by an infection that is spread during sexual contact. PID can also occur following:  °· The birth of a baby.   °· A miscarriage.   °· An abortion.   °· Major pelvic surgery.   °· The use of an intrauterine device (IUD).   °· A sexual assault.   °RISK FACTORS °Certain factors can put a person at higher risk for PID, such as: °· Being younger than 25 years. °· Being sexually active at a young age. °· Using nonbarrier contraception. °· Having multiple sexual partners. °· Having sex with someone who has symptoms of a genital infection. °· Using oral contraception. °Other times, certain behaviors can increase the possibility of getting PID, such as: °· Having sex during your period. °· Using a vaginal douche. °· Having an intrauterine device (IUD) in place. °SYMPTOMS  °· Abdominal or pelvic pain.   °· Fever.   °· Chills.   °· Abnormal vaginal discharge. °· Abnormal uterine bleeding.   °· Unusual pain shortly after finishing your period. °DIAGNOSIS  °Your caregiver will choose some of the following methods to make a diagnosis, such as:  °· Performing a physical exam and history. A pelvic exam typically reveals a very tender uterus and surrounding pelvis.   °· Ordering laboratory tests including a pregnancy test, blood tests, and urine test.  °· Ordering cultures of the vagina and cervix to check for a sexually transmitted infection (STI). °· Performing an ultrasound.    °· Performing a laparoscopic procedure to look inside the pelvis.   °TREATMENT  °· Antibiotic medicines may be prescribed and taken by mouth.   °· Sexual partners may be treated when the infection is caused by a sexually transmitted disease (STD).   °· Hospitalization may be needed to give antibiotics intravenously. °· Surgery may be needed, but this is rare. °It may take weeks until you are completely well. If you are diagnosed with PID, you should also be checked for human immunodeficiency virus (HIV).   °HOME CARE INSTRUCTIONS  °· If given, take your antibiotics as directed. Finish the medicine even if you start to feel better.   °· Only take over-the-counter or prescription medicines for pain, discomfort, or fever as directed by your caregiver.   °· Do not have sexual intercourse until treatment is completed or as directed by your caregiver. If PID is confirmed, your recent sexual partner(s) will need treatment.   °· Keep your follow-up appointments. °SEEK MEDICAL CARE IF:  °· You have increased or abnormal vaginal discharge.   °· You need prescription medicine for your pain.   °· You vomit.   °· You cannot take your medicines.   °· Your partner has an STD.   °SEEK IMMEDIATE MEDICAL CARE IF:  °· You have a fever.   °· You have increased abdominal or pelvic pain.   °· You have chills.   °· You have pain when you urinate.   °· You are not better after 72 hours following treatment.   °MAKE SURE YOU:  °· Understand these instructions. °· Will watch your condition. °· Will get help right away if you are not doing well or get worse. °  Document Released: 10/05/2005 Document Revised: 01/30/2013 Document Reviewed: 10/01/2011 °[employer]® Patient Information ©2015 [employer]. This information is not intended to replace advice given to you by your health care provider. Make sure you discuss any questions you have with your health care provider. ° °

## 2014-11-30 NOTE — Progress Notes (Signed)
Patient ID: Jessica Case, female   DOB: [birth year], 34 y.o.   MRN: [id number]  Chief Complaint  Patient presents with  . Follow-up    hydrosalpix on MRI    HPI Jessica Case is a 34 y.o. female.  Last several weeks increased low abdominal pain and bloating, MRI 12/30-right hydrosalpinx, no abscess  HPI  History reviewed. No pertinent past medical history.  Past Surgical History  Procedure Laterality Date  . Tooth extraction      Family History  Problem Relation Age of Onset  . Hypertension Mother     Social History History  Substance Use Topics  . Smoking status: Current Every Day Smoker -- 0.10 packs/day    Types: Cigarettes  . Smokeless tobacco: Never Used  . Alcohol Use: Yes     Comment: occ    Allergies  Allergen Reactions  . Citrus Itching  . Strawberry Hives    Current Outpatient Prescriptions  Medication Sig Dispense Refill  . Alpha-D-Galactosidase (BEANO MELTAWAYS) TBDP Take 2-3 each by mouth 3 (three) times daily with meals. (Patient not taking: Reported on 11/30/2014)    . doxycycline (VIBRAMYCIN) 100 MG capsule Take 1 capsule (100 mg total) by mouth 2 (two) times daily. 28 capsule 0  . naproxen (NAPROSYN) 500 MG tablet Take 1 tablet (500 mg total) by mouth 2 (two) times daily as needed for moderate pain (toe pain with food). (Patient not taking: Reported on 11/30/2014) 60 tablet 1  . traMADol-acetaminophen (ULTRACET) 37.5-325 MG per tablet Take 1 tablet by mouth every 6 (six) hours as needed. 30 tablet 0   Current Facility-Administered Medications  Medication Dose Route Frequency Provider Last Rate Last Dose  . cefTRIAXone (ROCEPHIN) injection 250 mg  250 mg Intramuscular Once Woodroe Mode, MD        Review of Systems Review of Systems  Constitutional: Negative for fever and unexpected weight change.  Gastrointestinal: Positive for nausea, abdominal pain and abdominal distention. Negative for constipation.  Genitourinary: Positive for urgency,  frequency, vaginal bleeding, menstrual problem and pelvic pain. Negative for dysuria and vaginal discharge.    Blood pressure 125/67, pulse 57, temperature 98.2 F (36.8 C), temperature source Oral, weight 149 lb 6.4 oz (67.767 kg), last menstrual period 10/20/2014.  Physical Exam Physical Exam  Constitutional: She is oriented to person, place, and time. She appears well-developed. No distress.  Pulmonary/Chest: Effort normal.  Abdominal: Soft. She exhibits distension. She exhibits no mass. There is tenderness. There is no rebound and no guarding.  Genitourinary:  Blood in vault, CMT, uterus nl right adnexal tenderness no mass  Neurological: She is alert and oriented to person, place, and time.  Skin: Skin is warm and dry.  Psychiatric: She has a normal mood and affect. Her behavior is normal.    Data Reviewed MRI- right hydrosalpinx  Assessment    Possible recurrent PID     Plan    Rocephin 250 mg IM, doxycycline 14 days, Ultracet prn pain, RTC 3 weeks,  F/u STD testing done today       Jessica Case 11/30/2014, 11:23 AM

## 2014-12-01 LAB — GC/CHLAMYDIA PROBE AMP
CT Probe RNA: NEGATIVE
GC Probe RNA: NEGATIVE

## 2014-12-05 ENCOUNTER — Ambulatory Visit (INDEPENDENT_AMBULATORY_CARE_PROVIDER_SITE_OTHER): Payer: Medicaid Other

## 2014-12-05 ENCOUNTER — Encounter: Payer: Self-pay | Admitting: Podiatry

## 2014-12-05 ENCOUNTER — Ambulatory Visit (INDEPENDENT_AMBULATORY_CARE_PROVIDER_SITE_OTHER): Payer: Medicaid Other | Admitting: Podiatry

## 2014-12-05 VITALS — BP 118/64 | HR 69 | Resp 18

## 2014-12-05 DIAGNOSIS — M2010 Hallux valgus (acquired), unspecified foot: Secondary | ICD-10-CM

## 2014-12-05 DIAGNOSIS — M2011 Hallux valgus (acquired), right foot: Secondary | ICD-10-CM

## 2014-12-05 DIAGNOSIS — M21611 Bunion of right foot: Secondary | ICD-10-CM

## 2014-12-05 NOTE — Patient Instructions (Signed)
Bunionectomy A bunionectomy is surgery to remove a bunion. A bunion is an enlargement of the joint at the base of the big toe. It is made up of bone and soft tissue on the inside part of the joint. Over time, a painful lump appears on the inside of the joint. The big toe begins to point inward toward the second toe. New bone growth can occur and a bone spur may form. The pain eventually causes difficulty walking. A bunion usually results from inflammation caused by the irritation of poorly fitting shoes. It often begins later in life. A bunionectomy is performed when nonsurgical treatment no longer works. When surgery is needed, the extent of the procedure will depend on the degree of deformity of the foot. Your surgeon will discuss with you the different procedures and what will work best for you depending on your age and health. LET YOUR CAREGIVER KNOW ABOUT:   Previous problems with anesthetics or medicines used to numb the skin.  Allergies to dyes, iodine, foods, and/or latex.  Medicines taken including herbs, eye drops, prescription medicines (especially medicines used to "thin the blood"), aspirin and other over-the-counter medicines, and steroids (by mouth or as a cream).  History of bleeding or blood problems.  Possibility of pregnancy, if this applies.  History of blood clots in your legs and/or lungs .  Previous surgery.  Other important health problems. RISKS AND COMPLICATIONS   Infection.  Pain.  Nerve damage.  Possibility that the bunion will recur. BEFORE THE PROCEDURE  You should be present 60 minutes prior to your procedure or as directed.  PROCEDURE  Surgery is often done so that you can go home the same day (outpatient). It may be done in a hospital or in an outpatient surgical center. An anesthetic will be used to help you sleep during the procedure. Sometimes, a spinal anesthetic is used to make you numb below the waist. A cut (incision) is made over the swollen  area at the first joint of the big toe. The enlarged lump will be removed. If there is a need to reposition the bones of the big toe, this may require more than 1 incision. The bone itself may need to be cut. Screws and wires may be used in the repair. These can be removed at a later date. In severe cases, the entire joint may need to be removed and a joint replacement inserted. When done, the incision is closed with stitches (sutures). Skin adhesive strips may be added for reinforcement. They help hold the incision closed.  AFTER THE PROCEDURE  Compression bandages (dressings) are then wrapped around the wound. This helps to keep the foot in alignment and reduce swelling. Your foot will be monitored for bleeding and swelling. You will need to stay for a few hours in the recovery area before being discharged. This allows time for the anesthesia to wear off. You will be discharged home when you are awake, stable, and doing well. HOME CARE INSTRUCTIONS   You can expect to return to normal activities within 6 to 8 weeks after surgery. The foot is at increased risk for swelling for several months. When you can expect to bear weight on the operated foot will depend on the extent of your surgery. The milder the deformity, the less tissue is removed and the sooner the return to normal activity level. During the recovery period, a special shoe, boot, or cast may be worn to accommodate the surgical bandage and to help provide stability   to the foot.  Once you are home, an ice pack applied to the operative site may help with discomfort and keep swelling down. Stop using the ice if it causes discomfort.  Keep your feet raised (elevated) when possible to lessen swelling.  If you have an elastic bandage on your foot and you have numbness, tingling, or your foot becomes cold and blue, adjust the bandage to make it comfortable.  Change dressings as directed.  Keep the wound dry and clean. The wound may be washed  gently with soap and water. Gently blot dry without rubbing. Do not take baths or use swimming pools or hot tubs for 10 days, or as instructed by your caregiver.  Only take over-the-counter or prescription medicines for pain, discomfort, or fever as directed by your caregiver.  You may continue a normal diet as directed.  For activity, use crutches with no weight bearing or your orthopedic shoe as directed. Continue to use crutches or a cane as directed until you can stand without causing pain. SEEK MEDICAL CARE IF:   You have redness, swelling, bruising, or increasing pain in the wound.  There is pus coming from the wound.  You have drainage from a wound lasting longer than 1 day.  You have an oral temperature above 102 F (38.9 C).  You notice a bad smell coming from the wound or dressing.  The wound breaks open after sutures have been removed.  You develop dizzy episodes or fainting while standing.  You have persistent nausea or vomiting.  Your toes become cold.  Pain is not relieved with medicines. SEEK IMMEDIATE MEDICAL CARE IF:   You develop a rash.  You have difficulty breathing.  You develop any reaction or side effects to medicines given.  Your toes are numb or blue, or you have severe pain. MAKE SURE YOU:   Understand these instructions.  Will watch your condition.  Will get help right away if you are not doing well or get worse. Document Released: 09/18/2005 Document Revised: 12/28/2011 Document Reviewed: 10/24/2007 [employer] Patient Information 2015 [employer]. This information is not intended to replace advice given to you by your health care provider. Make sure you discuss any questions you have with your health care provider.  

## 2014-12-05 NOTE — Progress Notes (Signed)
   Subjective:    Patient ID: Jessica Case, female    DOB: [birth year], 34 y.o.   MRN: [id number]  HPI  34 year old female presents the office today with complaints of right foot bunion pain. She states that she's had a bunion for several years and has more recently become painful, particularly with shoe gear and pressure. She states that she. There is a burning sensation overlying the prominence. She has attempted shoe gear changes without much resolution of symptoms. She's also seen an orthopedist who gave her a splint to wear. She also has a bunion on her left side which she states is not as symptomatic as the right. No other complaints at this time.   Review of Systems  All other systems reviewed and are negative.      Objective:   Physical Exam AAO 3, NAD DP/PT pulses palpable, CRT less than 3 seconds Protective sensation intact with Simms Weinstein monofilament, vibratory sensation intact, Achilles tendon reflex intact. Moderate HAV deformity is present bilaterally with a right greater than left. There is tenderness to palpation overlying the prominent dorsal medial eminence of the first metatarsal head. There is lateral deviation of the hallux. There is a bursa overlying the prominence of the first metatarsal head medially. There is no overlying erythema or increase in warmth. There is no pain with first MTPJ range of motion is no crepitus. No significant hypermobility bilaterally. Decrease in medial arch height upon weightbearing of mild equinus. No other areas of tenderness to bilateral lower extremity's. No overlying edema, erythema, increased warmth. MMT 5/5, ROM WNL No open lesions or pre-ulcerative lesions identified. No pain with calf compression, swelling, warmth, erythema.      Assessment & Plan:  34 year old female symptomatically HAV right greater than left. -X-rays were obtained and reviewed with the patient. -Treatment options were discussed the patient including  alternatives, risks, complications. -At this time discussed both conservative and surgical options. Conservative options which were discussed with her including shoe gear modifications, offloading pads and orthotics. At this time she states that she has attempted padding and shoe changes without much resolution of symptoms and she is inquiring about surgical intervention. I discussed with her various surgical options which would include either an aggressive Austin bunionectomy versus possible [place]. Given her social history likely proceed with a less aggressive Liane Comber bunionectomy knowing that it may not give perfect correction and there may be some residual deformity. She understands this. She will think about her options in regards to surgery and call the office if she decides to proceed.

## 2014-12-20 ENCOUNTER — Ambulatory Visit (INDEPENDENT_AMBULATORY_CARE_PROVIDER_SITE_OTHER): Payer: Medicaid Other | Admitting: Family

## 2014-12-20 ENCOUNTER — Emergency Department (HOSPITAL_COMMUNITY): Payer: Medicaid Other

## 2014-12-20 ENCOUNTER — Telehealth: Payer: Self-pay | Admitting: Family Medicine

## 2014-12-20 ENCOUNTER — Encounter (HOSPITAL_COMMUNITY): Payer: Self-pay | Admitting: *Deleted

## 2014-12-20 ENCOUNTER — Emergency Department (HOSPITAL_COMMUNITY)
Admission: EM | Admit: 2014-12-20 | Discharge: 2014-12-20 | Disposition: A | Discharge status: Home / Self Care | Payer: Medicaid Other | Attending: Emergency Medicine | Admitting: Emergency Medicine

## 2014-12-20 ENCOUNTER — Other Ambulatory Visit: Payer: Self-pay | Admitting: Family

## 2014-12-20 DIAGNOSIS — R1030 Lower abdominal pain, unspecified: Secondary | ICD-10-CM

## 2014-12-20 DIAGNOSIS — Z720 Tobacco use: Secondary | ICD-10-CM | POA: Insufficient documentation

## 2014-12-20 DIAGNOSIS — R110 Nausea: Secondary | ICD-10-CM | POA: Diagnosis not present

## 2014-12-20 DIAGNOSIS — R109 Unspecified abdominal pain: Secondary | ICD-10-CM

## 2014-12-20 DIAGNOSIS — Z79899 Other long term (current) drug therapy: Secondary | ICD-10-CM | POA: Insufficient documentation

## 2014-12-20 DIAGNOSIS — N7011 Chronic salpingitis: Secondary | ICD-10-CM

## 2014-12-20 DIAGNOSIS — Z3202 Encounter for pregnancy test, result negative: Secondary | ICD-10-CM | POA: Insufficient documentation

## 2014-12-20 DIAGNOSIS — Z7982 Long term (current) use of aspirin: Secondary | ICD-10-CM | POA: Diagnosis not present

## 2014-12-20 DIAGNOSIS — R102 Pelvic and perineal pain: Secondary | ICD-10-CM

## 2014-12-20 DIAGNOSIS — Z792 Long term (current) use of antibiotics: Secondary | ICD-10-CM | POA: Diagnosis not present

## 2014-12-20 HISTORY — DX: Salpingitis, unspecified: N70.91

## 2014-12-20 LAB — COMPREHENSIVE METABOLIC PANEL
ALBUMIN: 3.4 g/dL — AB (ref 3.5–5.2)
ALK PHOS: 59 U/L (ref 39–117)
ALT: 19 U/L (ref 0–35)
ANION GAP: 6 (ref 5–15)
AST: 23 U/L (ref 0–37)
BILIRUBIN TOTAL: 0.8 mg/dL (ref 0.3–1.2)
BUN: 7 mg/dL (ref 6–23)
CO2: 27 mmol/L (ref 19–32)
Calcium: 9.1 mg/dL (ref 8.4–10.5)
Chloride: 107 mmol/L (ref 96–112)
Creatinine, Ser: 1 mg/dL (ref 0.50–1.10)
GFR calc Af Amer: 84 mL/min — ABNORMAL LOW (ref >90)
GFR calc non Af Amer: 73 mL/min — ABNORMAL LOW (ref >90)
Glucose, Bld: 89 mg/dL (ref 70–99)
Potassium: 4.5 mmol/L (ref 3.5–5.1)
SODIUM: 140 mmol/L (ref 135–145)
Total Protein: 5.7 g/dL — ABNORMAL LOW (ref 6.0–8.3)

## 2014-12-20 LAB — CBC WITH DIFFERENTIAL/PLATELET
Basophils Absolute: 0 10*3/uL (ref 0.0–0.1)
Basophils Relative: 0 % (ref 0–1)
Eosinophils Absolute: 0.2 10*3/uL (ref 0.0–0.7)
Eosinophils Relative: 3 % (ref 0–5)
HCT: 37.8 % (ref 36.0–46.0)
Hemoglobin: 13 g/dL (ref 12.0–15.0)
LYMPHS PCT: 42 % (ref 12–46)
Lymphs Abs: 3 10*3/uL (ref 0.7–4.0)
MCH: 30.4 pg (ref 26.0–34.0)
MCHC: 34.4 g/dL (ref 30.0–36.0)
MCV: 88.5 fL (ref 78.0–100.0)
Monocytes Absolute: 0.9 10*3/uL (ref 0.1–1.0)
Monocytes Relative: 13 % — ABNORMAL HIGH (ref 3–12)
NEUTROS PCT: 42 % — AB (ref 43–77)
Neutro Abs: 3.1 10*3/uL (ref 1.7–7.7)
PLATELETS: 309 10*3/uL (ref 150–400)
RBC: 4.27 MIL/uL (ref 3.87–5.11)
RDW: 14.4 % (ref 11.5–15.5)
WBC: 7.2 10*3/uL (ref 4.0–10.5)

## 2014-12-20 LAB — LIPASE, BLOOD: Lipase: 30 U/L (ref 11–59)

## 2014-12-20 LAB — POC URINE PREG, ED: PREG TEST UR: NEGATIVE

## 2014-12-20 MED ORDER — MORPHINE SULFATE 4 MG/ML IJ SOLN
4.0000 mg | Freq: Once | INTRAMUSCULAR | Status: AC
  Administered 2014-12-20: 4 mg via INTRAVENOUS
  Filled 2014-12-20: qty 1

## 2014-12-20 MED ORDER — METRONIDAZOLE 500 MG PO TABS: 500.0000 mg | ORAL_TABLET | Freq: Two times a day (BID) | ORAL | Status: DC

## 2014-12-20 MED ORDER — TRAMADOL HCL 50 MG PO TABS: 50.0000 mg | ORAL_TABLET | Freq: Four times a day (QID) | ORAL | Status: DC | PRN

## 2014-12-20 NOTE — Progress Notes (Signed)
Subjective:     Patient ID: Jessica Case, female   DOB: 1981-02-23, 34 y.o.   MRN: [id number]  HPI Pt is a 34 yo female here for recurrent pelvic pain.  Seen in ED this morning complaining of 3 months of lower abdominal pain, acutely worsening over the past two days. She also reported "pressure" with urination, nausea, "hot and cold sweats." Her pain improves with applied heat and passing gas. Pt previously seen by Dr. Roselie Awkward and treated for hydrosalpinx.  Pt denies abnormal vaginal discharge. Recently completed doxyclycline per patient.   Reports that increased flatulence began around the same time as the abdominal pain.   Ultrasound today reported: IMPRESSION: Relatively stable LEFT hydrosalpinx considering differences in imaging technique.  2 subserosal leiomyomas, stable.   Review of Systems  Constitutional: Negative for fever.  Gastrointestinal: Positive for nausea and abdominal pain. Negative for vomiting, diarrhea and constipation.  Genitourinary: Negative for vaginal bleeding, vaginal discharge and vaginal pain.       Objective:   Physical Exam  Constitutional: She is oriented to person, place, and time. She appears well-developed and well-nourished.  HENT:  Head: Normocephalic.  Neck: Normal range of motion. Neck supple.  Abdominal: Soft. There is no tenderness.  Genitourinary: Uterus is enlarged (slightly). Cervix exhibits no motion tenderness, no discharge and no friability. Left adnexum displays tenderness. Left adnexum displays no fullness. Vaginal discharge (thin white discharge) found.  Neurological: She is alert and oriented to person, place, and time.  Skin: Skin is warm and dry.       Assessment:     Abdominal Pain     Plan:     GC/CT and wet prep collected Referral to gastro for further evaluation RX Flagyl 500 mg BID x 7 days  Gwen Pounds, CNM

## 2014-12-20 NOTE — ED Provider Notes (Signed)
CSN: [id number]     Arrival date & time 12/20/14  0214 History  This chart was scribed for Julianne Rice, MD by Rayfield Citizen, ED Scribe. This patient was seen in room A08C/A08C and the patient's care was started at 3:24 AM.    Chief Complaint  Patient presents with  . Abdominal Pain   The history is provided by the patient. No language interpreter was used.     HPI Comments: Jessica Case is a 34 y.o. female who presents to the Emergency Department complaining of 3 months of lower abdominal pain, acutely worsening over the past two days. This corresponds to her running out of her tramadol 2 days ago. She also reports "pressure" with urination, nausea, "hot and cold sweats." Her pain improves with applied heat and passing gas. She has been taking BC powders for pain without relief. She denies vomiting.   Patient explains that she has had a left fallopian tube "abscess" for 3 months; she is scheduled to see her Dr Roselie Awkward at [facility] tomorrow for this problem but "could not wait due to pain."    Past Medical History  Diagnosis Date  . Fallopian tube abscess    Past Surgical History  Procedure Laterality Date  . Tooth extraction     Family History  Problem Relation Age of Onset  . Hypertension Mother    History  Substance Use Topics  . Smoking status: Current Every Day Smoker -- 0.10 packs/day    Types: Cigarettes  . Smokeless tobacco: Never Used  . Alcohol Use: Yes     Comment: occ   OB History    Gravida Para Term Preterm AB TAB SAB Ectopic Multiple Living   2 1 0 1 1 0 1   0     Review of Systems  Constitutional: Positive for fever and chills.  Gastrointestinal: Positive for nausea and abdominal pain. Negative for vomiting, diarrhea and constipation.  Genitourinary: Positive for dysuria and pelvic pain. Negative for frequency, hematuria, flank pain, vaginal bleeding and vaginal discharge.  Musculoskeletal: Negative for myalgias, back pain, neck pain and neck stiffness.   Skin: Negative for rash and wound.  Neurological: Negative for dizziness, weakness, light-headedness, numbness and headaches.  All other systems reviewed and are negative.     Allergies  Citrus and Strawberry  Home Medications   Prior to Admission medications   Medication Sig Start Date End Date Taking? Authorizing Provider  Aspirin-Salicylamide-Caffeine (BC HEADACHE POWDER PO) Take 1-3 Packages by mouth daily as needed (pain).   Yes Historical Provider, MD  doxycycline (VIBRAMYCIN) 100 MG capsule Take 1 capsule (100 mg total) by mouth 2 (two) times daily. 11/30/14  Yes Woodroe Mode, MD  traMADol-acetaminophen (ULTRACET) 37.5-325 MG per tablet Take 1 tablet by mouth every 6 (six) hours as needed. Patient taking differently: Take 1 tablet by mouth every 6 (six) hours as needed for moderate pain.  11/30/14  Yes Woodroe Mode, MD  Alpha-D-Galactosidase [facility] MELTAWAYS) TBDP Take 2-3 each by mouth 3 (three) times daily with meals. Patient not taking: Reported on 11/30/2014 11/15/14   Minerva Ends, MD  naproxen (NAPROSYN) 500 MG tablet Take 1 tablet (500 mg total) by mouth 2 (two) times daily as needed for moderate pain (toe pain with food). Patient not taking: Reported on 11/30/2014 11/15/14   Minerva Ends, MD  oxyCODONE-acetaminophen (PERCOCET/ROXICET) 5-325 MG per tablet  08/30/14   Historical Provider, MD  traMADol (ULTRAM) 50 MG tablet Take 1 tablet (50 mg total) by  mouth every 6 (six) hours as needed for moderate pain or severe pain. 12/20/14   Julianne Rice, MD   BP 105/69 mmHg  Pulse 50  Temp(Src) 97.9 F (36.6 C) (Oral)  Resp 20  Ht 5\' 1"  (1.549 m)  Wt 147 lb (66.679 kg)  BMI 27.79 kg/m2  SpO2 99%  LMP 11/21/2014 Physical Exam  Constitutional: She is oriented to person, place, and time. She appears well-developed and well-nourished. No distress.  HENT:  Head: Normocephalic and atraumatic.  Mouth/Throat: Oropharynx is clear and moist.  Eyes: EOM are normal. Pupils  are equal, round, and reactive to light.  Neck: Normal range of motion. Neck supple.  Cardiovascular: Normal rate and regular rhythm.   Pulmonary/Chest: Effort normal and breath sounds normal. No respiratory distress. She has no wheezes. She has no rales. She exhibits no tenderness.  Abdominal: Soft. She exhibits distension. She exhibits no mass. There is tenderness (mild diffuse abdominal tenderness worse in the bilateral lower quadrant and pelvic region. there is no rebound or guarding.). There is no rebound and no guarding.  Hyperactive bowel sounds  Musculoskeletal: Normal range of motion. She exhibits no edema or tenderness.  No CVA tenderness bilaterally.  Neurological: She is alert and oriented to person, place, and time.  Skin: Skin is warm and dry. No rash noted. No erythema.  Psychiatric: She has a normal mood and affect. Her behavior is normal.  Nursing note and vitals reviewed.   ED Course  Procedures   DIAGNOSTIC STUDIES: Oxygen Saturation is 100% on RA, normal by my interpretation.    COORDINATION OF CARE: 3:30 AM Discussed treatment plan with pt at bedside and pt agreed to plan.   Labs Review Labs Reviewed  CBC WITH DIFFERENTIAL/PLATELET - Abnormal; Notable for the following:    Neutrophils Relative % 42 (*)    Monocytes Relative 13 (*)    All other components within normal limits  COMPREHENSIVE METABOLIC PANEL - Abnormal; Notable for the following:    Total Protein 5.7 (*)    Albumin 3.4 (*)    GFR calc non Af Amer 73 (*)    GFR calc Af Amer 84 (*)    All other components within normal limits  LIPASE, BLOOD  POC URINE PREG, ED    Imaging Review US Transvaginal Non-ob  12/20/2014   CLINICAL DATA:  Fallopian tube abscess. Abdominal pain, history of tubo-ovarian abscess. Prior MRI demonstrating LEFT hydrosalpinx and multiple leiomyomata.  EXAM: TRANSABDOMINAL AND TRANSVAGINAL ULTRASOUND OF PELVIS  TECHNIQUE: Both transabdominal and transvaginal ultrasound  examinations of the pelvis were performed. Transabdominal technique was performed for global imaging of the pelvis including uterus, ovaries, adnexal regions, and pelvic cul-de-sac. It was necessary to proceed with endovaginal exam following the transabdominal exam to visualize the adnexa.  COMPARISON:  MRI of the pelvis October 17, 2014  FINDINGS: Uterus  Measurements: 8.3 x 3.9 x 4.6 cm ; sub serosal 4.7 x 3.7 x 4.5 cm leiomyoma was better characterized on prior MRI. A second smaller RIGHT miduterine body leiomyoma again noted.  Endometrium  Thickness: 11 mm.  No focal abnormality visualized.  Right ovary  Measurements: 3.9 x 3 x 2.3 cm. Normal appearance/no adnexal mass.  Left ovary  Measurements: 3.4 x 3.3 x 2.4 cm. Normal appearance/no adnexal mass. Tubular at LEFT parovarian structure measures up to 2.6 cm in dimension, 7.1 cm and lying, and measured similarly on prior MRI.  Other findings  No free fluid.  IMPRESSION: Relatively stable LEFT hydrosalpinx considering differences in  imaging technique.  2 subserosal leiomyomas, stable.   Electronically Signed   By: Elon Alas   On: 12/20/2014 04:48   US Pelvis Complete  12/20/2014   CLINICAL DATA:  Fallopian tube abscess. Abdominal pain, history of tubo-ovarian abscess. Prior MRI demonstrating LEFT hydrosalpinx and multiple leiomyomata.  EXAM: TRANSABDOMINAL AND TRANSVAGINAL ULTRASOUND OF PELVIS  TECHNIQUE: Both transabdominal and transvaginal ultrasound examinations of the pelvis were performed. Transabdominal technique was performed for global imaging of the pelvis including uterus, ovaries, adnexal regions, and pelvic cul-de-sac. It was necessary to proceed with endovaginal exam following the transabdominal exam to visualize the adnexa.  COMPARISON:  MRI of the pelvis October 17, 2014  FINDINGS: Uterus  Measurements: 8.3 x 3.9 x 4.6 cm ; sub serosal 4.7 x 3.7 x 4.5 cm leiomyoma was better characterized on prior MRI. A second smaller RIGHT miduterine  body leiomyoma again noted.  Endometrium  Thickness: 11 mm.  No focal abnormality visualized.  Right ovary  Measurements: 3.9 x 3 x 2.3 cm. Normal appearance/no adnexal mass.  Left ovary  Measurements: 3.4 x 3.3 x 2.4 cm. Normal appearance/no adnexal mass. Tubular at LEFT parovarian structure measures up to 2.6 cm in dimension, 7.1 cm and lying, and measured similarly on prior MRI.  Other findings  No free fluid.  IMPRESSION: Relatively stable LEFT hydrosalpinx considering differences in imaging technique.  2 subserosal leiomyomas, stable.   Electronically Signed   By: Elon Alas   On: 12/20/2014 04:48     EKG Interpretation None      MDM   Final diagnoses:  Pelvic pain in female  Hydrosalpinx    I personally performed the services described in this documentation, which was scribed in my presence. The recorded information has been reviewed and is accurate.  Patient's abdominal pain is improved. She is asking for crackers and peanut butter in the emergency department. Laboratory workup is unremarkable. Repeat ultrasound read demonstrates hydrosalpinx. Patient defers pelvic examination until seen by OB/GYN later this afternoon. We'll discharge home to follow-up and has been given return precautions.     Julianne Rice, MD 12/20/14 [phone]

## 2014-12-20 NOTE — Telephone Encounter (Signed)
Recvd a call from [facility] that pt was seen with them and needs a referral for a GI consult for lower abd pain.

## 2014-12-20 NOTE — ED Notes (Signed)
Pt reports abscess on left fallopian tube x3 months.  Pt was supposed to go to doctor tomorrow to be seen for this problem, but pt has had increased pain and "pressure when i pee."  Pt alert and oriented.  Pt reports normal periods.

## 2014-12-20 NOTE — Discharge Instructions (Signed)
Follow-up with Dr. Roselie Awkward as previously scheduled this afternoon. Return immediately for worsening pain, fever, vaginal bleeding, discharge, persistent vomiting or for any concerns.  Pelvic Pain Female pelvic pain can be caused by many different things and start from a variety of places. Pelvic pain refers to pain that is located in the lower half of the abdomen and between your hips. The pain may occur over a short period of time (acute) or may be reoccurring (chronic). The cause of pelvic pain may be related to disorders affecting the female reproductive organs (gynecologic), but it may also be related to the bladder, kidney stones, an intestinal complication, or muscle or skeletal problems. Getting help right away for pelvic pain is important, especially if there has been severe, sharp, or a sudden onset of unusual pain. It is also important to get help right away because some types of pelvic pain can be life threatening.  CAUSES  Below are only some of the causes of pelvic pain. The causes of pelvic pain can be in one of several categories.   Gynecologic.  Pelvic inflammatory disease.  Sexually transmitted infection.  Ovarian cyst or a twisted ovarian ligament (ovarian torsion).  Uterine lining that grows outside the uterus (endometriosis).  Fibroids, cysts, or tumors.  Ovulation.  Pregnancy.  Pregnancy that occurs outside the uterus (ectopic pregnancy).  Miscarriage.  Labor.  Abruption of the placenta or ruptured uterus.  Infection.  Uterine infection (endometritis).  Bladder infection.  Diverticulitis.  Miscarriage related to a uterine infection (septic abortion).  Bladder.  Inflammation of the bladder (cystitis).  Kidney stone(s).  Gastrointestinal.  Constipation.  Diverticulitis.  Neurologic.  Trauma.  Feeling pelvic pain because of mental or emotional causes (psychosomatic).  Cancers of the bowel or pelvis. EVALUATION  Your caregiver will want to  take a careful history of your concerns. This includes recent changes in your health, a careful gynecologic history of your periods (menses), and a sexual history. Obtaining your family history and medical history is also important. Your caregiver may suggest a pelvic exam. A pelvic exam will help identify the location and severity of the pain. It also helps in the evaluation of which organ system may be involved. In order to identify the cause of the pelvic pain and be properly treated, your caregiver may order tests. These tests may include:   A pregnancy test.  Pelvic ultrasonography.  An X-ray exam of the abdomen.  A urinalysis or evaluation of vaginal discharge.  Blood tests. HOME CARE INSTRUCTIONS   Only take over-the-counter or prescription medicines for pain, discomfort, or fever as directed by your caregiver.   Rest as directed by your caregiver.   Eat a balanced diet.   Drink enough fluids to make your urine clear or pale yellow, or as directed.   Avoid sexual intercourse if it causes pain.   Apply warm or cold compresses to the lower abdomen depending on which one helps the pain.   Avoid stressful situations.   Keep a journal of your pelvic pain. Write down when it started, where the pain is located, and if there are things that seem to be associated with the pain, such as food or your menstrual cycle.  Follow up with your caregiver as directed.  SEEK MEDICAL CARE IF:  Your medicine does not help your pain.  You have abnormal vaginal discharge. SEEK IMMEDIATE MEDICAL CARE IF:   You have heavy bleeding from the vagina.   Your pelvic pain increases.   You feel light-headed  or faint.   You have chills.   You have pain with urination or blood in your urine.   You have uncontrolled diarrhea or vomiting.   You have a fever or persistent symptoms for more than 3 days.  You have a fever and your symptoms suddenly get worse.   You are being  physically or sexually abused.  MAKE SURE YOU:  Understand these instructions.  Will watch your condition.  Will get help if you are not doing well or get worse. Document Released: 09/01/2004 Document Revised: 02/19/2014 Document Reviewed: 01/25/2012 [facility] Patient Information 2015 [place], [place]. This information is not intended to replace advice given to you by your health care provider. Make sure you discuss any questions you have with your health care provider.

## 2014-12-20 NOTE — ED Notes (Signed)
Pelvic cart at the bedside 

## 2014-12-20 NOTE — ED Notes (Signed)
Pt states that she has had an absess on her fallopian tubes for 3 months. States that she has been on antibiotics for this. States that she has an appt with Dr Roselie Awkward at [facility] but the pain became too much. States that she was sent home on tramadol which has not worked and has also taken bc powder.

## 2014-12-20 NOTE — ED Notes (Signed)
Patient travelled to ultrasound with Tanya as chaperone.

## 2014-12-20 NOTE — Progress Notes (Signed)
Patient ID: Jessica Case, female   DOB: 05-08-1981, 34 y.o.   MRN: [id number] Pt complains of left sided pain, ER visit within past 12 hours, states she has a cyst on her ovary and fluid in her tube.

## 2014-12-20 NOTE — Progress Notes (Signed)
GI consult ordered. Patient has Medicaid [place] Access - I was unable to schedule GI consult with either [facility] or [facility]. Needs to referred by PCP. Patient is seen by [facility]. Called and left a message for [facility] and also send a referral order via [facility].  Also informed patient of plan and to follow up with [facility] if she does not get a call about Referral.

## 2014-12-20 NOTE — Addendum Note (Signed)
Addended by: Christiana Pellant A on: 12/20/2014 03:11 PM   Modules accepted: Orders

## 2014-12-21 DIAGNOSIS — R1030 Lower abdominal pain, unspecified: Secondary | ICD-10-CM | POA: Insufficient documentation

## 2014-12-21 LAB — WET PREP, GENITAL
TRICH WET PREP: NONE SEEN
Yeast Wet Prep HPF POC: NONE SEEN

## 2014-12-21 LAB — GC/CHLAMYDIA PROBE AMP
CT Probe RNA: NEGATIVE
GC Probe RNA: NEGATIVE

## 2014-12-21 NOTE — Telephone Encounter (Signed)
GI referral in.

## 2014-12-21 NOTE — Addendum Note (Signed)
Addended by: Boykin Nearing on: 12/21/2014 02:13 PM   Modules accepted: Orders

## 2014-12-27 ENCOUNTER — Telehealth: Payer: Self-pay | Admitting: Emergency Medicine

## 2014-12-27 NOTE — Telephone Encounter (Signed)
Patient is requesting a letter for an attorney in regards to Disability claim. She needs the letter to state she is not currently taking any alcohol or drugs. I spoke with Sharee Pimple and Junious Dresser who advised the patient that she needs a UDA, and the fastest way to get that is through an urgent care. Patient understood same.

## 2014-12-31 NOTE — Telephone Encounter (Signed)
Please call patient. I will write letter once I receive a negative UDS and blood alcohol level. She can come in here for lab draw and urine sample if she prefers and I will order the studies.

## 2015-01-02 ENCOUNTER — Ambulatory Visit: Payer: Medicaid Other | Admitting: Nurse Practitioner

## 2015-01-17 ENCOUNTER — Encounter (HOSPITAL_COMMUNITY): Payer: Self-pay | Admitting: *Deleted

## 2015-01-17 ENCOUNTER — Emergency Department (HOSPITAL_COMMUNITY)
Admission: EM | Admit: 2015-01-17 | Discharge: 2015-01-17 | Disposition: A | Discharge status: Home / Self Care | Payer: Medicaid Other | Attending: Emergency Medicine | Admitting: Emergency Medicine

## 2015-01-17 ENCOUNTER — Emergency Department (HOSPITAL_COMMUNITY): Payer: Medicaid Other

## 2015-01-17 DIAGNOSIS — Z792 Long term (current) use of antibiotics: Secondary | ICD-10-CM | POA: Diagnosis not present

## 2015-01-17 DIAGNOSIS — R109 Unspecified abdominal pain: Secondary | ICD-10-CM | POA: Diagnosis present

## 2015-01-17 DIAGNOSIS — B373 Candidiasis of vulva and vagina: Secondary | ICD-10-CM | POA: Diagnosis not present

## 2015-01-17 DIAGNOSIS — B3731 Acute candidiasis of vulva and vagina: Secondary | ICD-10-CM

## 2015-01-17 DIAGNOSIS — Z3202 Encounter for pregnancy test, result negative: Secondary | ICD-10-CM | POA: Insufficient documentation

## 2015-01-17 DIAGNOSIS — Z8742 Personal history of other diseases of the female genital tract: Secondary | ICD-10-CM | POA: Insufficient documentation

## 2015-01-17 DIAGNOSIS — R102 Pelvic and perineal pain: Secondary | ICD-10-CM | POA: Diagnosis not present

## 2015-01-17 DIAGNOSIS — Z720 Tobacco use: Secondary | ICD-10-CM | POA: Diagnosis not present

## 2015-01-17 LAB — GC/CHLAMYDIA PROBE AMP (~~LOC~~) NOT AT ARMC
CHLAMYDIA, DNA PROBE: NEGATIVE
NEISSERIA GONORRHEA: NEGATIVE

## 2015-01-17 LAB — COMPREHENSIVE METABOLIC PANEL
ALT: 27 U/L (ref 0–35)
ANION GAP: 7 (ref 5–15)
AST: 26 U/L (ref 0–37)
Albumin: 3.1 g/dL — ABNORMAL LOW (ref 3.5–5.2)
Alkaline Phosphatase: 47 U/L (ref 39–117)
BILIRUBIN TOTAL: 0.6 mg/dL (ref 0.3–1.2)
BUN: 7 mg/dL (ref 6–23)
CALCIUM: 8.9 mg/dL (ref 8.4–10.5)
CHLORIDE: 106 mmol/L (ref 96–112)
CO2: 25 mmol/L (ref 19–32)
CREATININE: 0.92 mg/dL (ref 0.50–1.10)
GFR calc non Af Amer: 80 mL/min — ABNORMAL LOW (ref >90)
GLUCOSE: 89 mg/dL (ref 70–99)
Potassium: 3.9 mmol/L (ref 3.5–5.1)
Sodium: 138 mmol/L (ref 135–145)
Total Protein: 4.9 g/dL — ABNORMAL LOW (ref 6.0–8.3)

## 2015-01-17 LAB — CBC WITH DIFFERENTIAL/PLATELET
Basophils Absolute: 0.1 10*3/uL (ref 0.0–0.1)
Basophils Relative: 1 % (ref 0–1)
EOS ABS: 0.2 10*3/uL (ref 0.0–0.7)
Eosinophils Relative: 2 % (ref 0–5)
HCT: 39.3 % (ref 36.0–46.0)
Hemoglobin: 13.2 g/dL (ref 12.0–15.0)
LYMPHS ABS: 3.3 10*3/uL (ref 0.7–4.0)
Lymphocytes Relative: 33 % (ref 12–46)
MCH: 30.3 pg (ref 26.0–34.0)
MCHC: 33.6 g/dL (ref 30.0–36.0)
MCV: 90.1 fL (ref 78.0–100.0)
MONOS PCT: 9 % (ref 3–12)
Monocytes Absolute: 0.9 10*3/uL (ref 0.1–1.0)
NEUTROS ABS: 5.4 10*3/uL (ref 1.7–7.7)
Neutrophils Relative %: 55 % (ref 43–77)
Platelets: 291 10*3/uL (ref 150–400)
RBC: 4.36 MIL/uL (ref 3.87–5.11)
RDW: 13.3 % (ref 11.5–15.5)
WBC: 9.9 10*3/uL (ref 4.0–10.5)

## 2015-01-17 LAB — WET PREP, GENITAL: Trich, Wet Prep: NONE SEEN

## 2015-01-17 LAB — URINALYSIS, ROUTINE W REFLEX MICROSCOPIC
BILIRUBIN URINE: NEGATIVE
Glucose, UA: NEGATIVE mg/dL
Hgb urine dipstick: NEGATIVE
Ketones, ur: NEGATIVE mg/dL
LEUKOCYTES UA: NEGATIVE
NITRITE: NEGATIVE
Protein, ur: NEGATIVE mg/dL
Specific Gravity, Urine: 1.021 (ref 1.005–1.030)
UROBILINOGEN UA: 1 mg/dL (ref 0.0–1.0)
pH: 6 (ref 5.0–8.0)

## 2015-01-17 LAB — POC URINE PREG, ED: Preg Test, Ur: NEGATIVE

## 2015-01-17 MED ORDER — HYDROMORPHONE HCL 1 MG/ML IJ SOLN
1.0000 mg | Freq: Once | INTRAMUSCULAR | Status: AC
  Administered 2015-01-17: 1 mg via INTRAVENOUS
  Filled 2015-01-17: qty 1

## 2015-01-17 MED ORDER — TRAMADOL HCL 50 MG PO TABS: 50.0000 mg | ORAL_TABLET | Freq: Four times a day (QID) | ORAL | Status: DC | PRN

## 2015-01-17 MED ORDER — TRAMADOL HCL 50 MG PO TABS
50.0000 mg | ORAL_TABLET | Freq: Once | ORAL | Status: AC
  Administered 2015-01-17: 50 mg via ORAL
  Filled 2015-01-17: qty 1

## 2015-01-17 MED ORDER — FLUCONAZOLE 100 MG PO TABS
150.0000 mg | ORAL_TABLET | Freq: Once | ORAL | Status: AC
  Administered 2015-01-17: 150 mg via ORAL
  Filled 2015-01-17: qty 2

## 2015-01-17 NOTE — ED Provider Notes (Signed)
CSN: [id number]     Arrival date & time 01/17/15  0030 History   First MD Initiated Contact with Patient 01/17/15 0229     This chart was scribed for Pamella Pert, MD by Forrestine Him, ED Scribe. This patient was seen in room A03C/A03C and the patient's care was started 2:34 AM.   Chief Complaint  Patient presents with  . Cyst   Patient is a 34 y.o. female presenting with abdominal pain. The history is provided by the patient. No language interpreter was used.  Abdominal Pain Pain quality: sharp   Pain radiates to:  Does not radiate Pain severity:  Severe Onset quality:  Gradual Duration:  2 days Timing:  Constant Progression:  Unchanged Chronicity:  Recurrent Relieved by:  Nothing Worsened by:  Nothing tried Ineffective treatments:  OTC medications Associated symptoms: vaginal discharge   Associated symptoms: no chest pain, no chills, no cough, no diarrhea, no dysuria, no fever, no nausea, no shortness of breath, no sore throat and no vomiting     HPI Comments: Jessica Case is a 34 y.o. female with a PMHx of fallopian tube abscess who presents to the Emergency Department complaining chronic, ongoing, recurrent R sided fallopian tube cyst x 2 days. Pain is described as sharp and rated 10/10. Pt has been seen previously for same several times. However, typically pain has been localized on the L side. Pt was recently referred to [facility] where she was admitted. At time of discharge, pt was prescribed an antibiotic and pain medication to help manage symptoms and in hopes of avoiding surgery. She was [facility]. At home pt has tried prescribed Tramadol from previous visit along with OTC BC powder without any improvement for symptoms. No recent fever, chills, or vomiting. Pt is currently sexually active. No known allergies to medications.  Past Medical History  Diagnosis Date  . Fallopian tube abscess    Past Surgical History  Procedure  Laterality Date  . Tooth extraction     Family History  Problem Relation Age of Onset  . Hypertension Mother    History  Substance Use Topics  . Smoking status: Current Every Day Smoker -- 0.10 packs/day    Types: Cigarettes  . Smokeless tobacco: Never Used  . Alcohol Use: Yes     Comment: occ   OB History    Gravida Para Term Preterm AB TAB SAB Ectopic Multiple Living   2 1 0 1 1 0 1   0     Review of Systems  Constitutional: Negative for fever and chills.  HENT: Negative for rhinorrhea and sore throat.   Eyes: Negative for visual disturbance.  Respiratory: Negative for cough and shortness of breath.   Cardiovascular: Negative for chest pain and leg swelling.  Gastrointestinal: Positive for abdominal pain. Negative for nausea, vomiting and diarrhea.  Genitourinary: Positive for vaginal discharge. Negative for dysuria.  Musculoskeletal: Negative for back pain and neck pain.  Skin: Negative for rash.  Neurological: Negative for dizziness, light-headedness and headaches.  Hematological: Does not bruise/bleed easily.  Psychiatric/Behavioral: Negative for confusion.      Allergies  Citrus and Strawberry  Home Medications   Prior to Admission medications   Medication Sig Start Date End Date Taking? Authorizing Provider  Aspirin-Salicylamide-Caffeine (BC HEADACHE POWDER PO) Take 1-3 Packages by mouth daily as needed (pain).    Historical Provider, MD  metroNIDAZOLE (FLAGYL) 500 MG tablet Take 1 tablet (500 mg total) by mouth 2 (two)  times daily. 12/20/14   Gwen Pounds, CNM  oxyCODONE-acetaminophen (PERCOCET/ROXICET) 5-325 MG per tablet  08/30/14   Historical Provider, MD  traMADol (ULTRAM) 50 MG tablet Take 1 tablet (50 mg total) by mouth every 6 (six) hours as needed for moderate pain or severe pain. 12/20/14   Julianne Rice, MD  traMADol-acetaminophen (ULTRACET) 37.5-325 MG per tablet Take 1 tablet by mouth every 6 (six) hours as needed. Patient taking differently: Take  1 tablet by mouth every 6 (six) hours as needed for moderate pain.  11/30/14   Woodroe Mode, MD   Triage Vitals: BP 123/101 mmHg  Pulse 72  Temp(Src) 97.8 F (36.6 C)  Resp 16  Ht 5\' 1"  (1.549 m)  Wt 143 lb (64.864 kg)  BMI 27.03 kg/m2  SpO2 100%  LMP 12/20/2014   Physical Exam  Constitutional: She is oriented to person, place, and time. She appears well-developed and well-nourished. No distress.  HENT:  Head: Normocephalic and atraumatic.  Right Ear: Hearing normal.  Left Ear: Hearing normal.  Nose: Nose normal.  Mouth/Throat: Oropharynx is clear and moist and mucous membranes are normal.  Eyes: Conjunctivae and EOM are normal. Pupils are equal, round, and reactive to light.  Neck: Normal range of motion. Neck supple.  Cardiovascular: Normal rate, regular rhythm, S1 normal and S2 normal.  Exam reveals no gallop and no friction rub.   No murmur heard. Pulmonary/Chest: Effort normal and breath sounds normal. No respiratory distress. She exhibits no tenderness.  Abdominal: Soft. Normal appearance and bowel sounds are normal. There is no hepatosplenomegaly. There is tenderness. There is no rebound, no guarding, no tenderness at McBurney's point and negative Murphy's sign. No hernia.  Tenderness of pelvic area diffusely, worse in R adnexa.  Genitourinary:  Normal-appearing external vagina. Normal-appearing cervix. Os closed. Mild tenderness of the adnexa bilaterally, pain is worse in the right adnexa.  Musculoskeletal: Normal range of motion.  Neurological: She is alert and oriented to person, place, and time. She has normal strength. No cranial nerve deficit or sensory deficit. Coordination normal. GCS eye subscore is 4. GCS verbal subscore is 5. GCS motor subscore is 6.  Skin: Skin is warm, dry and intact. No rash noted. No cyanosis.  Psychiatric: She has a normal mood and affect. Her speech is normal and behavior is normal. Thought content normal.  Nursing note and vitals  reviewed.   ED Course  Procedures (including critical care time)  DIAGNOSTIC STUDIES: Oxygen Saturation is 100% on RA, Normal by my interpretation.    COORDINATION OF CARE: 2:47 AM-Discussed treatment plan with pt at bedside and pt agreed to plan.     Labs Review Labs Reviewed  WET PREP, GENITAL - Abnormal; Notable for the following:    Yeast Wet Prep HPF POC FEW (*)    Clue Cells Wet Prep HPF POC TOO NUMEROUS TO COUNT (*)    WBC, Wet Prep HPF POC MODERATE (*)    All other components within normal limits  COMPREHENSIVE METABOLIC PANEL - Abnormal; Notable for the following:    Total Protein 4.9 (*)    Albumin 3.1 (*)    GFR calc non Af Amer 80 (*)    All other components within normal limits  URINALYSIS, ROUTINE W REFLEX MICROSCOPIC - Abnormal; Notable for the following:    APPearance CLOUDY (*)    All other components within normal limits  CBC WITH DIFFERENTIAL/PLATELET  POC URINE PREG, ED  GC/CHLAMYDIA PROBE AMP ([place])    Imaging  Review US Transvaginal Non-ob  01/17/2015   CLINICAL DATA:  Fallopian tube abscess. Evaluate tubo-ovarian abscess.  EXAM: TRANSABDOMINAL AND TRANSVAGINAL ULTRASOUND OF PELVIS  DOPPLER ULTRASOUND OF OVARIES  TECHNIQUE: Both transabdominal and transvaginal ultrasound examinations of the pelvis were performed. Transabdominal technique was performed for global imaging of the pelvis including uterus, ovaries, adnexal regions, and pelvic cul-de-sac.  It was necessary to proceed with endovaginal exam following the transabdominal exam to visualize the adnexa. Color and duplex Doppler ultrasound was utilized to evaluate blood flow to the ovaries.  COMPARISON:  Pelvic ultrasound December 20, 2014 and MRI of the pelvis October 17, 2014  FINDINGS: Uterus  Measurements: 7.5 x 3.5 x 4.6 cm. At least 1 sub serosal 3.2 x 2 x 2 cm fibroid again seen along RIGHT uterine bladder. Patient's additional sub serosal leiomyoma identified on prior MRI are not documented today.   Endometrium  Thickness: 10 mm.  No focal abnormality visualized.  Right ovary  Measurements: 5.1 x 2.6 x 2.5 cm. Normal appearance without suspicious mass. 2.2 cm dominant follicle.  Left ovary  Measurements: 3.1 x 2.1 x 1.8 cm. Normal appearance/no adnexal mass. 2.2 x 4.9 cm LEFT tubular anechoic structure without hypervascularity again seen.  Pulsed Doppler evaluation of both ovaries demonstrates normal low-resistance arterial and venous waveforms.  Other findings  Small amount of free fluid.  IMPRESSION: Relatively stable to smaller LEFT hydrosalpinx.  RIGHT uterine body sub serosal leiomyoma measures smaller today which is likely technical.   Electronically Signed   By: Elon Alas   On: 01/17/2015 05:11   US Pelvis Complete  01/17/2015   CLINICAL DATA:  Fallopian tube abscess. Evaluate tubo-ovarian abscess.  EXAM: TRANSABDOMINAL AND TRANSVAGINAL ULTRASOUND OF PELVIS  DOPPLER ULTRASOUND OF OVARIES  TECHNIQUE: Both transabdominal and transvaginal ultrasound examinations of the pelvis were performed. Transabdominal technique was performed for global imaging of the pelvis including uterus, ovaries, adnexal regions, and pelvic cul-de-sac.  It was necessary to proceed with endovaginal exam following the transabdominal exam to visualize the adnexa. Color and duplex Doppler ultrasound was utilized to evaluate blood flow to the ovaries.  COMPARISON:  Pelvic ultrasound December 20, 2014 and MRI of the pelvis October 17, 2014  FINDINGS: Uterus  Measurements: 7.5 x 3.5 x 4.6 cm. At least 1 sub serosal 3.2 x 2 x 2 cm fibroid again seen along RIGHT uterine bladder. Patient's additional sub serosal leiomyoma identified on prior MRI are not documented today.  Endometrium  Thickness: 10 mm.  No focal abnormality visualized.  Right ovary  Measurements: 5.1 x 2.6 x 2.5 cm. Normal appearance without suspicious mass. 2.2 cm dominant follicle.  Left ovary  Measurements: 3.1 x 2.1 x 1.8 cm. Normal appearance/no adnexal mass.  2.2 x 4.9 cm LEFT tubular anechoic structure without hypervascularity again seen.  Pulsed Doppler evaluation of both ovaries demonstrates normal low-resistance arterial and venous waveforms.  Other findings  Small amount of free fluid.  IMPRESSION: Relatively stable to smaller LEFT hydrosalpinx.  RIGHT uterine body sub serosal leiomyoma measures smaller today which is likely technical.   Electronically Signed   By: Elon Alas   On: 01/17/2015 05:11   [place] Art/ven Flow Abd Pelv Doppler  01/17/2015   CLINICAL DATA:  Fallopian tube abscess. Evaluate tubo-ovarian abscess.  EXAM: TRANSABDOMINAL AND TRANSVAGINAL ULTRASOUND OF PELVIS  DOPPLER ULTRASOUND OF OVARIES  TECHNIQUE: Both transabdominal and transvaginal ultrasound examinations of the pelvis were performed. Transabdominal technique was performed for global imaging of the pelvis including uterus, ovaries, adnexal  regions, and pelvic cul-de-sac.  It was necessary to proceed with endovaginal exam following the transabdominal exam to visualize the adnexa. Color and duplex Doppler ultrasound was utilized to evaluate blood flow to the ovaries.  COMPARISON:  Pelvic ultrasound December 20, 2014 and MRI of the pelvis October 17, 2014  FINDINGS: Uterus  Measurements: 7.5 x 3.5 x 4.6 cm. At least 1 sub serosal 3.2 x 2 x 2 cm fibroid again seen along RIGHT uterine bladder. Patient's additional sub serosal leiomyoma identified on prior MRI are not documented today.  Endometrium  Thickness: 10 mm.  No focal abnormality visualized.  Right ovary  Measurements: 5.1 x 2.6 x 2.5 cm. Normal appearance without suspicious mass. 2.2 cm dominant follicle.  Left ovary  Measurements: 3.1 x 2.1 x 1.8 cm. Normal appearance/no adnexal mass. 2.2 x 4.9 cm LEFT tubular anechoic structure without hypervascularity again seen.  Pulsed Doppler evaluation of both ovaries demonstrates normal low-resistance arterial and venous waveforms.  Other findings  Small amount of free fluid.  IMPRESSION:  Relatively stable to smaller LEFT hydrosalpinx.  RIGHT uterine body sub serosal leiomyoma measures smaller today which is likely technical.   Electronically Signed   By: Elon Alas   On: 01/17/2015 05:11     EKG Interpretation None      MDM   Final diagnoses:  Adnexal pain  Vaginal candidiasis    5:04 AM 34 y.o. female with chronic pelvic pain related to hydrosalpinx who presents with pelvic pain. She was initially seen in September 2015 for pyosalpinx versus hydrosalpinx. She was admitted to the [facility] hospital and treated conservatively. She has been following with OB/GYN since then. She has been seen in the ER several times since then with complaints of pelvic pain and had ultrasounds consistent with hydrosalpinx without significant change. She was recently seen in the ER this month and also with OB/GYN. The OB/GYN note states that they are going to arrange GI follow-up but does not directly address further treatment of the hydrosalpinx. The patient denies any fevers. She states that her pelvic pain is usually left-sided but has become right-sided in the last week. She is afebrile and vital signs are unremarkable here. She is taking Goody powders for her pain. She notes that her pain is consistent with the chronic pain that she has associated with the hydrosalpinx. I doubt any new findings but will repeat ultrasound to rule this out given change in location of the pain.  5:46 AM: Ultrasound consistent with stable to smaller left hydrosalpinx and right uterine body subserosal leiomyoma. The patient continues to appear on exam. Wet prep shows yeast which I will treat with fluconazole. Also numerous clue cells. The patient was treated with 7 days of Flagyl at her last OB/GYN visit. She denies vaginal discharge. Will defer to OB/GYN for any further treatment of possible BV. i suspect her symptoms are related to her chronic pelvic pain and not a new finding such as appendicitis. She notes that  she has had pain on the opposite side before. I have discussed the diagnosis/risks/treatment options with the patient and believe the pt to be eligible for discharge home to follow-up with her obgyn. We also discussed returning to the ED immediately if new or worsening sx occur. We discussed the sx which are most concerning (e.g., worsening pain, fever) that necessitate immediate return. Medications administered to the patient during their visit and any new prescriptions provided to the patient are listed below.  Medications given during this visit Medications  fluconazole (DIFLUCAN) tablet 150 mg (not administered)  traMADol (ULTRAM) tablet 50 mg (not administered)  HYDROmorphone (DILAUDID) injection 1 mg (1 mg Intravenous Given 01/17/15 0315)    New Prescriptions   TRAMADOL (ULTRAM) 50 MG TABLET    Take 1 tablet (50 mg total) by mouth every 6 (six) hours as needed.      I personally performed the services described in this documentation, which was scribed in my presence. The recorded information has been reviewed and is accurate.      Pamella Pert, MD 01/17/15 [phone]

## 2015-01-17 NOTE — ED Notes (Signed)
Gave pt hot packs and socks

## 2015-01-17 NOTE — ED Notes (Signed)
Pt states that she has a cyst and water on her fallopian tube. States that this is recurrent.

## 2015-01-17 NOTE — ED Notes (Signed)
MD at bedside. 

## 2015-01-17 NOTE — Discharge Instructions (Signed)
Abdominal Pain, Women °Abdominal (stomach, pelvic, or belly) pain can be caused by many things. It is important to tell your doctor: °· The location of the pain. °· Does it come and go or is it present all the time? °· Are there things that start the pain (eating certain foods, exercise)? °· Are there other symptoms associated with the pain (fever, nausea, vomiting, diarrhea)? °All of this is helpful to know when trying to find the cause of the pain. °CAUSES  °· Stomach: virus or bacteria infection, or ulcer. °· Intestine: appendicitis (inflamed appendix), regional ileitis (Crohn's disease), ulcerative colitis (inflamed colon), irritable bowel syndrome, diverticulitis (inflamed diverticulum of the colon), or cancer of the stomach or intestine. °· Gallbladder disease or stones in the gallbladder. °· Kidney disease, kidney stones, or infection. °· Pancreas infection or cancer. °· Fibromyalgia (pain disorder). °· Diseases of the female organs: °¨ Uterus: fibroid (non-cancerous) tumors or infection. °¨ Fallopian tubes: infection or tubal pregnancy. °¨ Ovary: cysts or tumors. °¨ Pelvic adhesions (scar tissue). °¨ Endometriosis (uterus lining tissue growing in the pelvis and on the pelvic organs). °¨ Pelvic congestion syndrome (female organs filling up with blood just before the menstrual period). °¨ Pain with the menstrual period. °¨ Pain with ovulation (producing an egg). °¨ Pain with an IUD (intrauterine device, birth control) in the uterus. °¨ Cancer of the female organs. °· Functional pain (pain not caused by a disease, may improve without treatment). °· Psychological pain. °· Depression. °DIAGNOSIS  °Your doctor will decide the seriousness of your pain by doing an examination. °· Blood tests. °· X-rays. °· Ultrasound. °· CT scan (computed tomography, special type of X-ray). °· MRI (magnetic resonance imaging). °· Cultures, for infection. °· Barium enema (dye inserted in the large intestine, to better view it with  X-rays). °· Colonoscopy (looking in intestine with a lighted tube). °· Laparoscopy (minor surgery, looking in abdomen with a lighted tube). °· Major abdominal exploratory surgery (looking in abdomen with a large incision). °TREATMENT  °The treatment will depend on the cause of the pain.  °· Many cases can be observed and treated at home. °· Over-the-counter medicines recommended by your caregiver. °· Prescription medicine. °· Antibiotics, for infection. °· Birth control pills, for painful periods or for ovulation pain. °· Hormone treatment, for endometriosis. °· Nerve blocking injections. °· Physical therapy. °· Antidepressants. °· Counseling with a psychologist or psychiatrist. °· Minor or major surgery. °HOME CARE INSTRUCTIONS  °· Do not take laxatives, unless directed by your caregiver. °· Take over-the-counter pain medicine only if ordered by your caregiver. Do not take aspirin because it can cause an upset stomach or bleeding. °· Try a clear liquid diet (broth or water) as ordered by your caregiver. Slowly move to a bland diet, as tolerated, if the pain is related to the stomach or intestine. °· Have a thermometer and take your temperature several times a day, and record it. °· Bed rest and sleep, if it helps the pain. °· Avoid sexual intercourse, if it causes pain. °· Avoid stressful situations. °· Keep your follow-up appointments and tests, as your caregiver orders. °· If the pain does not go away with medicine or surgery, you may try: °¨ Acupuncture. °¨ Relaxation exercises (yoga, meditation). °¨ Group therapy. °¨ Counseling. °SEEK MEDICAL CARE IF:  °· You notice certain foods cause stomach pain. °· Your home care treatment is not helping your pain. °· You need stronger pain medicine. °· You want your IUD removed. °· You feel faint or   lightheaded. °· You develop nausea and vomiting. °· You develop a rash. °· You are having side effects or an allergy to your medicine. °SEEK IMMEDIATE MEDICAL CARE IF:  °· Your  pain does not go away or gets worse. °· You have a fever. °· Your pain is felt only in portions of the abdomen. The right side could possibly be appendicitis. The left lower portion of the abdomen could be colitis or diverticulitis. °· You are passing blood in your stools (bright red or black tarry stools, with or without vomiting). °· You have blood in your urine. °· You develop chills, with or without a fever. °· You pass out. °MAKE SURE YOU:  °· Understand these instructions. °· Will watch your condition. °· Will get help right away if you are not doing well or get worse. °Document Released: 08/02/2007 Document Revised: 02/19/2014 Document Reviewed: 08/22/2009 °Exit[employer]® Patient Information ©2015 [employer]. This information is not intended to replace advice given to you by your health care provider. Make sure you discuss any questions you have with your health care provider. ° °

## 2015-01-17 NOTE — ED Notes (Signed)
Pt states she's been seen multiple times for cysts on her fallopian tubes, states she started having pain again within the last two days. Gradual onset. Heat helpful. BC powder, tramadol not effective.

## 2015-01-28 ENCOUNTER — Ambulatory Visit: Payer: Medicaid Other | Attending: Family Medicine | Admitting: Family Medicine

## 2015-01-28 ENCOUNTER — Encounter: Payer: Self-pay | Admitting: Family Medicine

## 2015-01-28 DIAGNOSIS — F17200 Nicotine dependence, unspecified, uncomplicated: Secondary | ICD-10-CM | POA: Diagnosis not present

## 2015-01-28 DIAGNOSIS — N7011 Chronic salpingitis: Secondary | ICD-10-CM | POA: Diagnosis not present

## 2015-01-28 DIAGNOSIS — Z23 Encounter for immunization: Secondary | ICD-10-CM | POA: Diagnosis not present

## 2015-01-28 DIAGNOSIS — R109 Unspecified abdominal pain: Secondary | ICD-10-CM | POA: Insufficient documentation

## 2015-01-28 DIAGNOSIS — R1030 Lower abdominal pain, unspecified: Secondary | ICD-10-CM | POA: Diagnosis not present

## 2015-01-28 MED ORDER — TRAMADOL HCL 50 MG PO TABS: 50.0000 mg | ORAL_TABLET | Freq: Two times a day (BID) | ORAL | Status: DC | PRN

## 2015-01-28 NOTE — Patient Instructions (Addendum)
Ms. Betton,  It was good to see you again.  1. Pain control for now 2. GI appt to evaluate bowel 3. If bowel normal, back to Gyn to come up with a plan for fluid in Left fallopian tube.   F/u in 2 months  Dr. Adrian Blackwater

## 2015-01-28 NOTE — Progress Notes (Signed)
   Subjective:    Patient ID: Jessica Case, female    DOB: 1981/03/02, 34 y.o.   MRN: [id number] CC: abdominal pain  HPI  1. Lower abdominal pain: R sided. With gas. Persistent. Improving. Tramadol helps. Has known L hydrosalpinx.  Has a GI appt,  Feb 27, 2015   Soc Hx: current smoker  Review of Systems  Constitutional: Negative for fever and chills.  Gastrointestinal: Positive for abdominal pain. Negative for nausea, vomiting, diarrhea, constipation, blood in stool, abdominal distention, anal bleeding and rectal pain.       Objective:   Physical Exam BP 131/86 mmHg  Pulse 88  Temp(Src) 98.8 F (37.1 C) (Oral)  Resp 18  Ht 5\' 1"  (1.549 m)  Wt 140 lb (63.504 kg)  BMI 26.47 kg/m2  SpO2 97%  LMP 01/27/2015  Wt Readings from Last 3 Encounters:  01/28/15 140 lb (63.504 kg)  01/17/15 143 lb (64.864 kg)  12/20/14 147 lb (66.679 kg)  General appearance: alert, cooperative and no distress Lungs: clear to auscultation bilaterally Heart: regular rate and rhythm, S1, S2 normal, no murmur, click, rub or gallop Abdomen: soft, flat, mild TTP RLQ w/o mass, rebound, guarding        Assessment & Plan:

## 2015-01-28 NOTE — Progress Notes (Signed)
F/U Fluid in fallopian tubes Sx still same, no changes since last visit

## 2015-01-29 NOTE — Assessment & Plan Note (Signed)
A: persistent yet improving R sided pain, no red flags except mild weight loss. Etiology unclear as hydrosalpinx is only anatomical abnormality and this is on the L side P: 1. Pain control for now 2. GI appt to evaluate bowel 3. If bowel normal, back to Gyn to come up with a plan for fluid in Left fallopian tube.

## 2015-02-27 ENCOUNTER — Encounter: Payer: Self-pay | Admitting: Gastroenterology

## 2015-02-27 ENCOUNTER — Ambulatory Visit (INDEPENDENT_AMBULATORY_CARE_PROVIDER_SITE_OTHER): Payer: Medicaid Other | Admitting: Gastroenterology

## 2015-02-27 VITALS — BP 128/82 | HR 100 | Ht 61.0 in | Wt 139.1 lb

## 2015-02-27 DIAGNOSIS — R143 Flatulence: Secondary | ICD-10-CM | POA: Diagnosis not present

## 2015-02-27 NOTE — Assessment & Plan Note (Signed)
This flatulence probably is related to changes and got flora as results of frequent antibiotic use.  Lactose intolerance is also a possibility.  Recommendations #1 begin flora Q1 daily.  Patient was instructed to call back in approximate 3 weeks.  If not improved I would try her on a lactose-free diet or add lactase supplementation  Cc Dr. Adrian Blackwater

## 2015-02-27 NOTE — Patient Instructions (Addendum)
You have been given a separate informational sheet regarding your tobacco use, the importance of quitting and local resources to help you quit. Try an over the counter probiotic (Flora Q daily). Call back in 3 weeks if not better.  CC: Jessica Nearing, MD

## 2015-02-27 NOTE — Progress Notes (Signed)
_                                                                                                                History of Present Illness:  Jessica Case is a 34 year old Afro-American female referred at the request of Dr. Adrian Blackwater for evaluation of excess flatulence.  This is been a problem for at least 6-8 months.  She is unaware of any particular foods causing symptoms.  She claims to move her bowels every day.  She was taking MiraLAX with good results.  She denies abdominal pain or change in bowel habits except for right-sided abdominal discomfort which she attributes to a fallopian tube abscess.  She has no upper GI complaints.  She claims that she's been taking antibiotics on and off for recurrent lower abdominal pain.   Past Medical History  Diagnosis Date  . Fallopian tube abscess   . Paranoid schizophrenia   . Claustrophobia    Past Surgical History  Procedure Laterality Date  . Tooth extraction     family history includes Hypertension in her mother. Current Outpatient Prescriptions  Medication Sig Dispense Refill  . amoxicillin (AMOXIL) 500 MG capsule Take 1 capsule by mouth every 6 (six) hours.    . Aspirin-Salicylamide-Caffeine (BC HEADACHE POWDER PO) Take 1-3 Packages by mouth daily as needed (pain).    Marland Kitchen loratadine (CLARITIN) 10 MG tablet Take 10 mg by mouth daily.    . traMADol (ULTRAM) 50 MG tablet Take 1 tablet (50 mg total) by mouth every 12 (twelve) hours as needed for moderate pain or severe pain. 45 tablet 0   No current facility-administered medications for this visit.   Allergies as of 02/27/2015 - Review Complete 02/27/2015  Allergen Reaction Noted  . Citrus Itching 07/12/2014  . Strawberry Hives 06/28/2012    reports that she has been smoking Cigarettes.  She has been smoking about 0.10 packs per day. She has never used smokeless tobacco. She reports that she drinks alcohol. She reports that she does not use illicit drugs.   Review of  Systems: Pertinent positive and negative review of systems were noted in the above HPI section. All other review of systems were otherwise negative.  Vital signs were reviewed in today's medical record Physical Exam: General: Well developed , well nourished, no acute distress Skin: anicteric Head: Normocephalic and atraumatic Eyes:  sclerae anicteric, EOMI Ears: Normal auditory acuity Mouth: No deformity or lesions Neck: Supple, no masses or thyromegaly Lymph Nodes: no lymphadenopathy Lungs: Clear throughout to auscultation Heart: Regular rate and rhythm; no murmurs, rubs or bruits Gastroinestinal: Soft, non tender and non distended. No masses, hepatosplenomegaly or hernias noted. Normal Bowel sounds Rectal:deferred Musculoskeletal: Symmetrical with no gross deformities  Skin: No lesions on visible extremities Pulses:  Normal pulses noted Extremities: No clubbing, cyanosis, edema or deformities noted Neurological: Alert oriented x 4, grossly nonfocal Cervical Nodes:  No significant cervical adenopathy Inguinal Nodes: No significant inguinal adenopathy Psychological:  Alert and cooperative. Normal mood and affect  See Assessment  and Plan under Problem List

## 2015-03-07 ENCOUNTER — Ambulatory Visit: Payer: Medicaid Other | Attending: Family Medicine | Admitting: Family Medicine

## 2015-03-07 ENCOUNTER — Encounter: Payer: Self-pay | Admitting: Family Medicine

## 2015-03-07 VITALS — BP 114/70 | HR 78 | Temp 97.0°F | Resp 16 | Ht 61.0 in | Wt 142.0 lb

## 2015-03-07 DIAGNOSIS — R1010 Upper abdominal pain, unspecified: Secondary | ICD-10-CM

## 2015-03-07 DIAGNOSIS — N898 Other specified noninflammatory disorders of vagina: Secondary | ICD-10-CM

## 2015-03-07 DIAGNOSIS — R1030 Lower abdominal pain, unspecified: Secondary | ICD-10-CM

## 2015-03-07 DIAGNOSIS — L298 Other pruritus: Secondary | ICD-10-CM | POA: Diagnosis not present

## 2015-03-07 MED ORDER — ACIDOPHILUS PROBIOTIC 10 MG PO TABS: 1.0000 | ORAL_TABLET | Freq: Three times a day (TID) | ORAL | Status: DC

## 2015-03-07 MED ORDER — POLYETHYLENE GLYCOL 3350 17 GM/SCOOP PO POWD: 17.0000 g | Freq: Two times a day (BID) | ORAL | Status: DC | PRN

## 2015-03-07 MED ORDER — FLUCONAZOLE 150 MG PO TABS: 150.0000 mg | ORAL_TABLET | ORAL | Status: DC

## 2015-03-07 MED ORDER — LORATADINE 10 MG PO TABS: 10.0000 mg | ORAL_TABLET | Freq: Every day | ORAL | Status: DC

## 2015-03-07 NOTE — Assessment & Plan Note (Signed)
1. Upper abdominal pain: likely from constipation miralax refilled Probiotic ordered   Tape worm is not possible Due to lack of weight loss Tapeworm is not common in urban [place] Wt Readings from Last 3 Encounters:  03/07/15 142 lb (64.411 kg)  02/27/15 139 lb 2 oz (63.107 kg)  01/28/15 140 lb (63.504 kg)

## 2015-03-07 NOTE — Assessment & Plan Note (Signed)
Resolved

## 2015-03-07 NOTE — Progress Notes (Signed)
   Subjective:    Patient ID: Jessica Case, female    DOB: 17-May-1981, 34 y.o.   MRN: [id number] CC: vaginal itching, upper abdominal pain  HPI  1. Vaginal itching: having discharge with mild itching for one week. No bleeding or pelvic pain.   2. Upper abdominal pain: b/l upper abdomen. Mild pain. Non-radiating. X 3 weeks. Lower abdominal pain has resolved. Patient saw GI last week and advised to start pro-biotics for pain related to altered gut flora from multiple courses of antibiotics. She has not started probiotics. Request miralax refill. ? GI tapeworm.   Soc Hx: non smoker  Review of Systems  Constitutional: Negative for fever, chills and unexpected weight change.  Gastrointestinal: Positive for abdominal pain and constipation. Negative for nausea, vomiting, diarrhea, blood in stool, abdominal distention, anal bleeding and rectal pain.  Genitourinary: Positive for vaginal discharge.      Objective:   Physical Exam BP 114/70 mmHg  Pulse 78  Temp(Src) 97 F (36.1 C) (Oral)  Resp 16  Ht 5\' 1"  (1.549 m)  Wt 142 lb (64.411 kg)  BMI 26.84 kg/m2  SpO2 99%  LMP 02/20/2015 General appearance: alert, cooperative and no distress Abdomen: soft, non-tender; bowel sounds normal; no masses,  no organomegaly Pelvic: deferred      Assessment & Plan:

## 2015-03-07 NOTE — Assessment & Plan Note (Signed)
A: Vaginal itching: suspect yeast P: diflucan sent in one today and one 3 days from now

## 2015-03-07 NOTE — Patient Instructions (Addendum)
Ms. Ikard,  1. Upper abdominal pain: likely from constipation miralax refilled Probiotic ordered   Tape worm is not possible Due to lack of weight loss Tapeworm is not common in urban [place] Wt Readings from Last 3 Encounters:  03/07/15 142 lb (64.411 kg)  02/27/15 139 lb 2 oz (63.107 kg)  01/28/15 140 lb (63.504 kg)    2. Med refills: claritin  3. Vaginal itching: diflucan sent in one today and one 3 days from now  F/u as needed for abdominal discomfort   Dr. Adrian Blackwater

## 2015-03-07 NOTE — Progress Notes (Signed)
Complaining of possible yeast infection Vaginal itching and odor Upper abdominal pain worsen at night

## 2015-04-16 ENCOUNTER — Encounter: Payer: Self-pay | Admitting: Family Medicine

## 2015-04-16 ENCOUNTER — Ambulatory Visit: Payer: Medicaid Other | Attending: Family Medicine | Admitting: Family Medicine

## 2015-04-16 ENCOUNTER — Other Ambulatory Visit (HOSPITAL_COMMUNITY)
Admission: RE | Admit: 2015-04-16 | Discharge: 2015-04-16 | Disposition: A | Discharge status: Home / Self Care | Payer: Medicaid Other | Source: Ambulatory Visit | Attending: Family Medicine | Admitting: Family Medicine

## 2015-04-16 VITALS — BP 126/85 | HR 69 | Temp 98.8°F | Resp 18 | Ht 61.0 in | Wt 144.0 lb

## 2015-04-16 DIAGNOSIS — R1010 Upper abdominal pain, unspecified: Secondary | ICD-10-CM | POA: Diagnosis not present

## 2015-04-16 DIAGNOSIS — A5901 Trichomonal vulvovaginitis: Secondary | ICD-10-CM

## 2015-04-16 DIAGNOSIS — M2011 Hallux valgus (acquired), right foot: Secondary | ICD-10-CM | POA: Diagnosis not present

## 2015-04-16 DIAGNOSIS — N760 Acute vaginitis: Secondary | ICD-10-CM | POA: Insufficient documentation

## 2015-04-16 DIAGNOSIS — Z113 Encounter for screening for infections with a predominantly sexual mode of transmission: Secondary | ICD-10-CM | POA: Diagnosis not present

## 2015-04-16 LAB — POCT URINALYSIS DIPSTICK
Bilirubin, UA: NEGATIVE
GLUCOSE UA: NEGATIVE
Ketones, UA: NEGATIVE
LEUKOCYTES UA: NEGATIVE
NITRITE UA: NEGATIVE
Protein, UA: NEGATIVE
RBC UA: NEGATIVE
Spec Grav, UA: 1.025
UROBILINOGEN UA: 1
pH, UA: 6

## 2015-04-16 LAB — POCT URINE PREGNANCY: PREG TEST UR: NEGATIVE

## 2015-04-16 MED ORDER — ACETAMINOPHEN-CODEINE #3 300-30 MG PO TABS: 1.0000 | ORAL_TABLET | Freq: Three times a day (TID) | ORAL | Status: DC | PRN

## 2015-04-16 NOTE — Assessment & Plan Note (Signed)
Abdominal pain: I suspect GERD or constipation Normal exam except for vaginal discharge  Tylenol #3 for pain  You will be called with lab results

## 2015-04-16 NOTE — Progress Notes (Signed)
Referral for [facility] specialist  Stomach pain x 2- days  F/U HTN

## 2015-04-16 NOTE — Patient Instructions (Signed)
Ms. Massoud,  Thank you for coming in today  1. Concern about HTN: You are not HTN today Low salt diet  2. Bunion:  podiatry referral  3. Abdominal pain: I suspect GERD or constipation Normal exam except for vaginal discharge  Tylenol #3 for pain  You will be called with lab results  F/u in 7-10 days if abdominal pain persist F/u in 3 months if pain improves  Dr. Adrian Blackwater

## 2015-04-16 NOTE — Progress Notes (Signed)
   Subjective:    Patient ID: Jessica Case, female    DOB: June 12, 1981, 34 y.o.   MRN: [id number] CC: R foot bunion pain, abdominal pain,  HPI 34 yo F with hx of L tuboovarian abscess presents for f/u visit  1. Abdominal pain: x 2 days. Epigastric and upper periumbilical pain. Mild constipation. Taking amoxicillin still. No diarrhea, nausea, emesis, fever. Has itching that is worse with tramadol.   2. R foot bunion pain: pain in R foot. Mild swelling. No recent injury. Would like to go back to podiatry.   3. Concern about HTN: patient wakes up with "starry vision" at times. Get HA when she eats salty foods. No CP, SOB or edema.   Soc Hx: current smoker  Review of Systems  Constitutional: Positive for fever. Negative for chills.  Eyes: Positive for visual disturbance.  Gastrointestinal: Positive for abdominal pain. Negative for nausea, vomiting, diarrhea, blood in stool, abdominal distention, anal bleeding and rectal pain.  Genitourinary: Negative for vaginal bleeding, vaginal discharge, vaginal pain and pelvic pain.  Musculoskeletal: Positive for gait problem.      Objective:   Physical Exam BP 126/85 mmHg  Pulse 69  Temp(Src) 98.8 F (37.1 C) (Oral)  Resp 18  Ht 5\' 1"  (1.549 m)  Wt 144 lb (65.318 kg)  BMI 27.22 kg/m2  SpO2 100%  LMP 03/22/2015  Wt Readings from Last 3 Encounters:  04/16/15 144 lb (65.318 kg)  03/07/15 142 lb (64.411 kg)  02/27/15 139 lb 2 oz (63.107 kg)   BP Readings from Last 3 Encounters:  04/16/15 126/85  03/07/15 114/70  02/27/15 128/82  General appearance: alert, cooperative and no distress Lungs: normal WOB Abdomen: soft, round, non distended, mild TTP epigastric area, no rebound or guarding Pelvic: normal external genitalia, moderate amount of thick white discharge around the cervix, no CMT, no uterine or adnexal mass or tenderness  Treatment: GI cocktail x 1     Assessment & Plan:   1. Concern about HTN: You are not HTN today Low  salt diet  2. Bunion:  podiatry referral  3. Abdominal pain: I suspect GERD or constipation Normal exam except for vaginal discharge  Tylenol #3 for pain  You will be called with lab results

## 2015-04-16 NOTE — Assessment & Plan Note (Signed)
Bunion:  podiatry referral

## 2015-04-17 ENCOUNTER — Telehealth: Payer: Self-pay | Admitting: *Deleted

## 2015-04-17 DIAGNOSIS — A5901 Trichomonal vulvovaginitis: Secondary | ICD-10-CM | POA: Insufficient documentation

## 2015-04-17 LAB — CERVICOVAGINAL ANCILLARY ONLY
Chlamydia: NEGATIVE
Neisseria Gonorrhea: NEGATIVE
Wet Prep (BD Affirm): POSITIVE — AB

## 2015-04-17 MED ORDER — METRONIDAZOLE 500 MG PO TABS: 500.0000 mg | ORAL_TABLET | Freq: Two times a day (BID) | ORAL | Status: DC

## 2015-04-17 MED ORDER — FLUCONAZOLE 150 MG PO TABS: 150.0000 mg | ORAL_TABLET | Freq: Once | ORAL | Status: DC

## 2015-04-17 NOTE — Assessment & Plan Note (Signed)
A: trich and BV on wet prep P: Flagyl for 7 days Partners need treatment Followed by diflucan

## 2015-04-17 NOTE — Telephone Encounter (Signed)
Pt aware of results 

## 2015-04-17 NOTE — Addendum Note (Signed)
Addended by: Boykin Nearing on: 04/17/2015 09:29 AM   Modules accepted: Orders

## 2015-04-17 NOTE — Telephone Encounter (Signed)
-----   Message from Boykin Nearing, MD sent at 04/17/2015  9:27 AM EDT ----- Patient with trich and BV  Sent in flagyl 7 day course, partner (s) must also be treated Take diflucan after flagyl

## 2015-04-24 ENCOUNTER — Telehealth: Payer: Self-pay | Admitting: Family Medicine

## 2015-04-24 DIAGNOSIS — A5901 Trichomonal vulvovaginitis: Secondary | ICD-10-CM

## 2015-04-24 NOTE — Telephone Encounter (Signed)
Patient has come in today to ask PCP if she can receive another script for metroNIDAZOLE (FLAGYL) 500 MG tablet; patient says that she has lost her medication

## 2015-04-25 MED ORDER — METRONIDAZOLE 500 MG PO TABS: 500.0000 mg | ORAL_TABLET | Freq: Two times a day (BID) | ORAL | Status: DC

## 2015-04-25 NOTE — Telephone Encounter (Signed)
Rx send to [facility] pharmacy Pt aware  

## 2015-04-26 ENCOUNTER — Telehealth: Payer: Self-pay | Admitting: Family Medicine

## 2015-04-26 NOTE — Telephone Encounter (Signed)
Patient has made a mistake in the medication she was requesting because it was for the same Sx; Patient came in to say that she was supposed to request the one day diflucan instead; patient states that she returned the Flagyl back to the pharmacy and apologizes for the misunderstanding; patient would like to pick up diflucan from [facility] as well;

## 2015-04-30 ENCOUNTER — Other Ambulatory Visit: Payer: Self-pay | Admitting: *Deleted

## 2015-04-30 DIAGNOSIS — A5901 Trichomonal vulvovaginitis: Secondary | ICD-10-CM

## 2015-04-30 MED ORDER — FLUCONAZOLE 150 MG PO TABS: 150.0000 mg | ORAL_TABLET | Freq: Once | ORAL | Status: DC

## 2015-04-30 NOTE — Telephone Encounter (Signed)
Rx reorder, send to [facility] pharmacy

## 2015-05-14 ENCOUNTER — Emergency Department (HOSPITAL_COMMUNITY)
Admission: EM | Admit: 2015-05-14 | Discharge: 2015-05-14 | Discharge status: Against Medical Advice | Payer: Medicaid Other | Attending: Emergency Medicine | Admitting: Emergency Medicine

## 2015-05-14 ENCOUNTER — Encounter (HOSPITAL_COMMUNITY): Payer: Self-pay | Admitting: Emergency Medicine

## 2015-05-14 DIAGNOSIS — Z79899 Other long term (current) drug therapy: Secondary | ICD-10-CM | POA: Diagnosis not present

## 2015-05-14 DIAGNOSIS — Z8742 Personal history of other diseases of the female genital tract: Secondary | ICD-10-CM | POA: Diagnosis not present

## 2015-05-14 DIAGNOSIS — F200 Paranoid schizophrenia: Secondary | ICD-10-CM | POA: Insufficient documentation

## 2015-05-14 DIAGNOSIS — Z792 Long term (current) use of antibiotics: Secondary | ICD-10-CM | POA: Insufficient documentation

## 2015-05-14 DIAGNOSIS — Z760 Encounter for issue of repeat prescription: Secondary | ICD-10-CM | POA: Diagnosis present

## 2015-05-14 DIAGNOSIS — Z720 Tobacco use: Secondary | ICD-10-CM | POA: Diagnosis not present

## 2015-05-14 NOTE — ED Provider Notes (Signed)
CSN: [id number]     Arrival date & time 05/14/15  0910 History   First MD Initiated Contact with Patient 05/14/15 [phone]     Chief Complaint  Patient presents with  . Medication Refill     (Consider location/radiation/quality/duration/timing/severity/associated sxs/prior Treatment) HPI Comments: Patient is a 34 year old female with a past medical history of paranoid schizophrenia who presents requesting a medication refill. Patient reports she lost her prescriptions for Geodon, Trazodone, and Lorazepam last week and has not had her medications in 5 days. Patient reports she wants her medication so "she can feel better." Patient does not elaborate on details. She denies SI/HI, drug use and alcohol use. No other symptoms. Patient's father is present who states she has been having visual and auditory hallucinations which is baseline for her.    Past Medical History  Diagnosis Date  . Fallopian tube abscess   . Paranoid schizophrenia   . Claustrophobia    Past Surgical History  Procedure Laterality Date  . Tooth extraction     Family History  Problem Relation Age of Onset  . Hypertension Mother    History  Substance Use Topics  . Smoking status: Current Every Day Smoker -- 0.10 packs/day    Types: Cigarettes  . Smokeless tobacco: Never Used  . Alcohol Use: 0.0 oz/week    0 Standard drinks or equivalent per week     Comment: occ beer    OB History    Gravida Para Term Preterm AB TAB SAB Ectopic Multiple Living   2 1 0 1 1 0 1   0     Review of Systems  Constitutional: Negative for fever, chills and fatigue.  HENT: Negative for trouble swallowing.   Eyes: Negative for visual disturbance.  Respiratory: Negative for shortness of breath.   Cardiovascular: Negative for chest pain and palpitations.  Gastrointestinal: Negative for nausea, vomiting, abdominal pain and diarrhea.  Genitourinary: Negative for dysuria and difficulty urinating.  Musculoskeletal: Negative for arthralgias  and neck pain.  Skin: Negative for color change.  Neurological: Negative for dizziness and weakness.  Psychiatric/Behavioral: Positive for agitation. Negative for dysphoric mood.      Allergies  Citrus; Strawberry; and Toradol  Home Medications   Prior to Admission medications   Medication Sig Start Date End Date Taking? Authorizing Provider  acetaminophen-codeine (TYLENOL #3) 300-30 MG per tablet Take 1 tablet by mouth every 8 (eight) hours as needed for moderate pain. 04/16/15   Josalyn Funches, MD  amoxicillin (AMOXIL) 500 MG capsule Take 500 mg by mouth 3 (three) times daily.    Historical Provider, MD  fluconazole (DIFLUCAN) 150 MG tablet Take 1 tablet (150 mg total) by mouth once. 04/30/15   Josalyn Funches, MD  Lactobacillus (ACIDOPHILUS PROBIOTIC) 10 MG TABS Take 1 tablet by mouth 3 (three) times daily. 03/07/15   Josalyn Funches, MD  loratadine (CLARITIN) 10 MG tablet Take 1 tablet (10 mg total) by mouth daily. 03/07/15   Josalyn Funches, MD  metroNIDAZOLE (FLAGYL) 500 MG tablet Take 1 tablet (500 mg total) by mouth 2 (two) times daily. 04/25/15   Josalyn Funches, MD  polyethylene glycol powder (GLYCOLAX/MIRALAX) powder Take 17 g by mouth 2 (two) times daily as needed. 03/07/15   Josalyn Funches, MD  traZODone (DESYREL) 150 MG tablet Take 150 mg by mouth at bedtime.    Historical Provider, MD  ziprasidone (GEODON) 40 MG capsule Take 40 mg by mouth 2 (two) times daily with a meal.    Historical Provider,  MD   BP 157/84 mmHg  Pulse 78  Temp(Src) 98.5 F (36.9 C) (Oral)  Resp 20  Ht 5\' 1"  (1.549 m)  Wt 145 lb (65.772 kg)  BMI 27.41 kg/m2  SpO2 100%  LMP 04/19/2015 Physical Exam  Constitutional: She is oriented to person, place, and time. She appears well-developed and well-nourished. No distress.  HENT:  Head: Normocephalic and atraumatic.  Eyes: Conjunctivae and EOM are normal.  Neck: Normal range of motion.  Cardiovascular: Normal rate and regular rhythm.  Exam reveals no  gallop and no friction rub.   No murmur heard. Pulmonary/Chest: Effort normal and breath sounds normal. She has no wheezes. She has no rales. She exhibits no tenderness.  Abdominal: Soft. She exhibits no distension. There is no tenderness. There is no rebound.  Musculoskeletal: Normal range of motion.  Neurological: She is alert and oriented to person, place, and time. Coordination normal.  Speech is goal-oriented. Moves limbs without ataxia.   Skin: Skin is warm and dry.  Psychiatric:  Patient is agitated.   Nursing note and vitals reviewed.   ED Course  Procedures (including critical care time) Labs Review Labs Reviewed - No data to display  Imaging Review No results found.   EKG Interpretation None      MDM   Final diagnoses:  Medication refill    9:40 AM Consult to TTS pending. Vitals stable and patient afebrile. Patient is currently agitated and threatened to leave. Patient denies SI/HI at this time.     Alvina Chou, PA-C 05/14/15 [place], MD 05/14/15 1028

## 2015-05-14 NOTE — ED Notes (Signed)
Pt left prior to getting help. Explained to pt that we were trying to figure out if we could get her medications refilled but pt stated she was leaving and tired of waiting. This nurse assisted pt and pts visitor out to waiting room.

## 2015-05-14 NOTE — ED Notes (Signed)
States lost prescription for GEODON, TRAZADONE, LORAZEPAM,  Has not had any medicine for 4 days-- "I want to get my medicine so I feel better" states always hears voices and sees things, but worse now.

## 2015-05-17 ENCOUNTER — Ambulatory Visit (INDEPENDENT_AMBULATORY_CARE_PROVIDER_SITE_OTHER): Payer: Medicaid Other | Admitting: Podiatry

## 2015-05-17 ENCOUNTER — Ambulatory Visit: Payer: Medicaid Other

## 2015-05-17 ENCOUNTER — Encounter: Payer: Self-pay | Admitting: Podiatry

## 2015-05-17 VITALS — BP 148/101 | HR 101 | Resp 18

## 2015-05-17 DIAGNOSIS — M2011 Hallux valgus (acquired), right foot: Secondary | ICD-10-CM

## 2015-05-17 DIAGNOSIS — R52 Pain, unspecified: Secondary | ICD-10-CM | POA: Diagnosis not present

## 2015-05-17 DIAGNOSIS — M21611 Bunion of right foot: Secondary | ICD-10-CM

## 2015-05-17 NOTE — Progress Notes (Signed)
Patient ID: Jessica Case, female   DOB: June 15, 1981, 34 y.o.   MRN: [id number]  Subjective: 34 year old female presents the office today for follow-up evaluation continued care of right foot painful bunion. She states that since last appointment she has tried shoe gear modifications and offloading without any relief of symptoms and she continues to have pain to the right bunion on a daily basis. She states that she has some numbness and tingling mostly overlying the bunion site. She has pain with ambulation daily. At this time she is requesting surgical intervention to help decrease her pain and deformity.  Objective: AAO x3, NAD DP/PT pulses palpable bilaterally, CRT less than 3 seconds Protective sensation intact with Simms Weinstein monofilament, vibratory sensation intact, Achilles tendon reflex intact There is moderate structural HAV deformity present bilaterally with a right worse than left. There is tenderness palpation A medial aspect of the first metatarsal head and slight irritation from shoe gear is present with mild amount of erythema. There is no pain or crepitation with first MTPJ range of motion. There is no hypermobility present. There is slight tenderness along the bunion on the left side however saw significant the right side. There is a decrease in medial arch height upon weightbearing bilaterally. No other areas of tenderness to bilateral lower extremities. MMT 5/5, ROM WNL.  No open lesions or pre-ulcerative lesions.  No overlying edema, erythema, increase in warmth to bilateral lower extremities.  No pain with calf compression, swelling, warmth, erythema bilaterally.   Assessment: 34 year old female with symptomatic right foot bunion deformity.  Plan: -Previous x-rays were discussed and reviewed with the patient. -Treatment options discussed including all alternatives, risks, and complications. I discussed both conservative and surgical treatment options. This time she is  attended to conservative treatment without any relief and she is requesting surgical intervention to help decrease her pain and deformity. -I discussed with her Lapidus versus an aggressive Austin bunionectomy. I don't believe that she would do well the postoperative course of a Lapidus bunionectomy given her social situation. Because of this a discussed with her less aggressive procedure which would include an Ship broker. Discussed that this may not give her complete resolution of the bunion however do believe that if her significant reduction symptoms. She agrees to this. The incision placement as well as the postoperative course was discussed with the patient. I discussed risks of the surgery which include, but not limited to, infection, bleeding, pain, swelling, need for further surgery, delayed or nonhealing, painful or ugly scar, numbness or sensation changes, over/under correction, recurrence, transfer lesions, further deformity, hardware failure, DVT/PE, loss of toe/foot. Patient understands these risks and wishes to proceed with surgery. The surgical consent was reviewed with the patient all 3 pages were signed. No promises or guarantees were given to the outcome of the procedure. All questions were answered to the best of my ability. Before the surgery the patient was encouraged to call the office if there is any further questions. The surgery will be performed at the [facility] on an outpatient basis.  [name], DPM

## 2015-05-17 NOTE — Patient Instructions (Signed)
Pre-Operative Instructions  Congratulations, you have decided to take an important step to improving your quality of life.  You can be assured that the doctors of [facility] will be with you every step of the way.  1. Plan to be at the surgery center/hospital at least 1 (one) hour prior to your scheduled time unless otherwise directed by the surgical center/hospital staff.  You must have a responsible adult accompany you, remain during the surgery and drive you home.  Make sure you have directions to the surgical center/hospital and know how to get there on time. 2. For hospital based surgery you will need to obtain a history and physical form from your family physician within 1 month prior to the date of surgery- we will give you a form for you primary physician.  3. We make every effort to accommodate the date you request for surgery.  There are however, times where surgery dates or times have to be moved.  We will contact you as soon as possible if a change in schedule is required.   4. No Aspirin/Ibuprofen for one week before surgery.  If you are on aspirin, any non-steroidal anti-inflammatory medications (Mobic, Aleve, Ibuprofen) you should stop taking it 7 days prior to your surgery.  You make take Tylenol  For pain prior to surgery.  5. Medications- If you are taking daily heart and blood pressure medications, seizure, reflux, allergy, asthma, anxiety, pain or diabetes medications, make sure the surgery center/hospital is aware before the day of surgery so they may notify you which medications to take or avoid the day of surgery. 6. No food or drink after midnight the night before surgery unless directed otherwise by surgical center/hospital staff. 7. No alcoholic beverages 24 hours prior to surgery.  No smoking 24 hours prior to or 24 hours after surgery. 8. Wear loose pants or shorts- loose enough to fit over bandages, boots, and casts. 9. No slip on shoes, sneakers are best. 10. Bring  your boot with you to the surgery center/hospital.  Also bring crutches or a walker if your physician has prescribed it for you.  If you do not have this equipment, it will be provided for you after surgery. 11. If you have not been contracted by the surgery center/hospital by the day before your surgery, call to confirm the date and time of your surgery. 12. Leave-time from work may vary depending on the type of surgery you have.  Appropriate arrangements should be made prior to surgery with your employer. 13. Prescriptions will be provided immediately following surgery by your doctor.  Have these filled as soon as possible after surgery and take the medication as directed. 14. Remove nail polish on the operative foot. 15. Wash the night before surgery.  The night before surgery wash the foot and leg well with the antibacterial soap provided and water paying special attention to beneath the toenails and in between the toes.  Rinse thoroughly with water and dry well with a towel.  Perform this wash unless told not to do so by your physician.  Enclosed: 1 Ice pack (please put in freezer the night before surgery)   1 Hibiclens skin cleaner   Pre-op Instructions  If you have any questions regarding the instructions, do not hesitate to call our office.  [place]: [address] [place], [place] [address] [phone]  [place]: [address] [place], [place] [address] [phone]  [place]: [address]  [place], [place] [address] [phone]  Dr. Richard   Tuchman DPM, Dr. Norman Regal DPM Dr. Richard Sikora DPM, Dr. M. Todd Hyatt DPM, Dr. Kathryn Egerton DPM 

## 2015-06-05 ENCOUNTER — Encounter (INDEPENDENT_AMBULATORY_CARE_PROVIDER_SITE_OTHER): Payer: Medicaid Other | Admitting: Podiatry

## 2015-06-05 DIAGNOSIS — M2012 Hallux valgus (acquired), left foot: Secondary | ICD-10-CM

## 2015-06-06 ENCOUNTER — Telehealth: Payer: Self-pay | Admitting: *Deleted

## 2015-06-06 NOTE — Telephone Encounter (Addendum)
Pt states she had surgery yesterday, and has an question about the boot and an appt. I called and pt states she is having sharp pains in her big toe, and the Hydrocodone is not helping.  I instructed pt to remove the boot, open-ended sock and ace wrap only, then elevated the foot for 15 mins then rewrap the foot looser and replace the boot, walk and sleep in the boot at all times until otherwise directed, ice, weight bearing 5 min/hour, take the Hydrocodone as directed and if tolerates Ibuprofen can take in between the Hydrocodone, can take Ibuprofen 200mg  2 tablets every 8 hours to increase the coverage of the discomfort.  Pt states understanding.  I encouraged pt to call with concerns.

## 2015-06-10 ENCOUNTER — Ambulatory Visit (INDEPENDENT_AMBULATORY_CARE_PROVIDER_SITE_OTHER): Payer: Medicaid Other | Admitting: Podiatry

## 2015-06-10 ENCOUNTER — Encounter: Payer: Self-pay | Admitting: Podiatry

## 2015-06-10 ENCOUNTER — Ambulatory Visit (INDEPENDENT_AMBULATORY_CARE_PROVIDER_SITE_OTHER): Payer: Medicaid Other

## 2015-06-10 VITALS — BP 147/99 | HR 59 | Resp 18

## 2015-06-10 DIAGNOSIS — Z9889 Other specified postprocedural states: Secondary | ICD-10-CM

## 2015-06-10 DIAGNOSIS — M2011 Hallux valgus (acquired), right foot: Secondary | ICD-10-CM

## 2015-06-10 DIAGNOSIS — M21611 Bunion of right foot: Secondary | ICD-10-CM

## 2015-06-10 MED ORDER — OXYCODONE-ACETAMINOPHEN 10-325 MG PO TABS: 1.0000 | ORAL_TABLET | Freq: Four times a day (QID) | ORAL | Status: DC | PRN

## 2015-06-10 NOTE — Progress Notes (Signed)
Patient ID: Jessica Case, female   DOB: 1981/10/15, 34 y.o.   MRN: [id number]  DOS: 06/05/15 s/p Right [place]   Subjective:  34 year old female presents the office a week status post right bunion surgery. She states that overall she is doing well that she does have pain. She states that she is taking 3 Percocet help alleviate her pain and she is asking if she had a stronger dose of Percocet She is not taking as much pain medicine. She states that she's continue with the Cam Walker all times. She has tried ice and elevate as much as possible. She's been taken antibiotics as directed. Denies any systemic complaints as fevers, chills, nausea, vomiting. Denies any calf pain, chest pain concerns of breath. No other complaints this time in no acute changes otherwise.  Objective: AAO x3, NAD DP/PT pulses palpable bilaterally, CRT less than 3 seconds Protective sensation intact with Simms Weinstein monofilament Incision is well coapted without any evidence of dehisence and sutures are intact. There is no surrounding erythema, ascending cellulitis, fluctuance, crepitance, malodor, drainage/purulence. There is mild edema over the surgical site. There is mild tenderness over the surgical site.  No other areas of tenderness to bilateral lower extremities. MMT 5/5, ROM WNL.  No open lesions or pre-ulcerative lesions.  No overlying edema, erythema, increase in warmth to bilateral lower extremities.  No pain with calf compression, swelling, warmth, erythema bilaterally.   Assessment: 1 week status post right bunion surgery  Plan: -X-rays were obtained and reviewed with the patient.  -Treatment options discussed including all alternatives, risks, and complications - Antibiotic ointment was applied over the incisions followed by dry sterile dressing. Keep dressing clean, dry, intact. -Continue cam walker, even at night. -Ice and elevation -Pain medication as needed. Prescribed Percocet  10/325. -Monitor for any clinical signs or symptoms of infection and directed to call the office immediately should any occur or go to the ER. -Follow-up 1 week for suture removal or sooner if any problems arise. In the meantime, encouraged to call the office with any questions, concerns, change in symptoms.   [name], DPM

## 2015-06-17 ENCOUNTER — Ambulatory Visit (INDEPENDENT_AMBULATORY_CARE_PROVIDER_SITE_OTHER): Payer: Medicaid Other | Admitting: Podiatry

## 2015-06-17 ENCOUNTER — Encounter: Payer: Self-pay | Admitting: Podiatry

## 2015-06-17 VITALS — BP 144/96 | HR 76 | Resp 18

## 2015-06-17 DIAGNOSIS — Z9889 Other specified postprocedural states: Secondary | ICD-10-CM

## 2015-06-17 DIAGNOSIS — M2011 Hallux valgus (acquired), right foot: Secondary | ICD-10-CM

## 2015-06-17 DIAGNOSIS — M21611 Bunion of right foot: Secondary | ICD-10-CM

## 2015-06-17 MED ORDER — CEPHALEXIN 500 MG PO CAPS: 500.0000 mg | ORAL_CAPSULE | Freq: Three times a day (TID) | ORAL | Status: DC

## 2015-06-17 NOTE — Progress Notes (Signed)
Patient ID: Jessica Case, female   DOB: 1981/02/16, 34 y.o.   MRN: [id number]  Subjective: 34 year old female presents the office today 2 weeks status post right foot Austin bunionectomy. She presents they for suture removal. She states that overall she is doing well she remained in the cam walker. She states her pain medication as needed however her pain is better controlled and she has decreased the amount that she is taking. She denies any systemic complaints as fevers, chills, nausea, vomiting. No calf pain, chest pain, shortness of breath.  Objective: AAO 3, NAD DP/PT pulses palpable, CRT less than 3 seconds Protective sensation intact with Simms Weinstein monofilament Incisional in the dorsal medial aspect of the right foot with sutures intact. There is a small amount of superficial dehiscence on the proximal aspect of the incision have the dehiscence appears to be superficial and over on the outer layers of the skin. There is no probing, undermining, tunneling. There is mild edema along the surgical site. There is no surrounding erythema, ascending cellulitis, fluctuance, crepitus, malodor, drainage/purulence. There is no clinical signs of infection. There is mild tenderness to palpation along the surgical site.no other areas of tenderness to bilateral lower extremities. No open lesions or pre-ulcer lesions identified elsewhere. No pain with calf compression, swelling, warmth, erythema.  Assessment: 34 year old female with superficial dehiscence on the proximal aspect of the incision s/p Austin bunionectomy  Plan: -Treatment options discussed including all alternatives, risks, and complications -suturesremained intact due to the superficial dehiscence.Steri-Strips are applied followed by antibiotic ointment. Continue daily dressing changes. Monitor for any clinical signs or symptoms of infection and directed to call the office immediately should any occur or go to the ER. -Continue with  CAM walker. -Ice and elevation. -Keflex in case of infection. -Follow-up 1 weeks or sooner if any problems arise. In the meantime, encouraged to call the office with any questions, concerns, change in symptoms.  [name], DPM

## 2015-06-26 ENCOUNTER — Encounter: Payer: Self-pay | Admitting: Podiatry

## 2015-06-26 ENCOUNTER — Ambulatory Visit (INDEPENDENT_AMBULATORY_CARE_PROVIDER_SITE_OTHER): Payer: Medicaid Other | Admitting: Podiatry

## 2015-06-26 VITALS — BP 144/108 | HR 83 | Resp 18

## 2015-06-26 DIAGNOSIS — Z9889 Other specified postprocedural states: Secondary | ICD-10-CM

## 2015-06-26 DIAGNOSIS — M21611 Bunion of right foot: Secondary | ICD-10-CM

## 2015-06-26 DIAGNOSIS — M2011 Hallux valgus (acquired), right foot: Secondary | ICD-10-CM

## 2015-06-26 NOTE — Progress Notes (Signed)
Patient ID: Jessica Case, female   DOB: 11/07/1980, 34 y.o.   MRN: [id number]  Subjective: 34 year old female presents the office today 3 weeks status post right foot Austin bunionectomy.  She states that overall she is doing well she remained in the CAM walker and her pain has been better controlled since last appointment. She denies any systemic complaints as fevers, chills, nausea, vomiting. No calf pain, chest pain, shortness of breath. No other complaints at this time and no acute changes.   Objective: AAO 3, NAD DP/PT pulses palpable, CRT less than 3 seconds Protective sensation intact with Simms Weinstein monofilament Incisional in the dorsal medial aspect of the right foot with sutures intact. The wound is well coapted without any evidence of dehiscence at this time. There is mild edema around the area without any associated erythema, increase in warmth, ascending cellulitis. There is no drainage or purulence. There is no tenderness palpation overlying the incision. No other areas of tenderness to bilateral lower extremities. No open lesions or pre-ulcer lesions identified elsewhere. No pain with calf compression, swelling, warmth, erythema.  Assessment: 34 year old female with s/p Austin bunionectomy; improving.   Plan: -Treatment options discussed including all alternatives, risks, and complications -Sutures removed without complications. Incision remains well coapted. Antibiotic ointment was over the incision followed by dry sterile dressing. She can remove the bandages to monitor to shower as long of the incision remains coapted. It is a problems to hold off on [place] call the office immediately. -Continue with CAM walker. -Ice and elevation. -Monitor for any clinical signs or symptoms of infection and directed to call the office immediately should any occur or go to the ER. -Follow-up 2 weeks or sooner if any problems arise. In the meantime, encouraged to call the office with any  questions, concerns, change in symptoms. *x-ray next appointment.   [name], DPM

## 2015-07-08 ENCOUNTER — Encounter: Payer: Self-pay | Admitting: Podiatry

## 2015-07-08 ENCOUNTER — Ambulatory Visit (INDEPENDENT_AMBULATORY_CARE_PROVIDER_SITE_OTHER): Payer: Medicaid Other | Admitting: Podiatry

## 2015-07-08 ENCOUNTER — Ambulatory Visit (INDEPENDENT_AMBULATORY_CARE_PROVIDER_SITE_OTHER): Payer: Medicaid Other

## 2015-07-08 VITALS — BP 138/83 | HR 61 | Resp 18

## 2015-07-08 DIAGNOSIS — Z9889 Other specified postprocedural states: Secondary | ICD-10-CM

## 2015-07-08 DIAGNOSIS — M2011 Hallux valgus (acquired), right foot: Secondary | ICD-10-CM | POA: Diagnosis not present

## 2015-07-08 DIAGNOSIS — M21611 Bunion of right foot: Secondary | ICD-10-CM

## 2015-07-08 MED ORDER — OXYCODONE-ACETAMINOPHEN 5-325 MG PO TABS: 1.0000 | ORAL_TABLET | Freq: Three times a day (TID) | ORAL | Status: DC | PRN

## 2015-07-08 NOTE — Progress Notes (Signed)
Patient ID: Jessica Case, female   DOB: 06-Jun-1981, 34 y.o.   MRN: [id number]  Subjective: 34 year old female presents the office today  status post right foot Austin bunionectomy.  She states that she does not wear her cam boot around the house and she goes barefoot. She wears the cam boot when she leaves the house or does a lot of walking. She is an occasional discomfort as she is on her feet more. She denies any redness or increase in warmth. She has some intermittent swelling. She denies any systemic complaints as fevers, chills, nausea, vomiting. No calf pain, chest pain, shortness of breath. No other complaints at this time and no acute changes.   Objective: AAO 3, NAD DP/PT pulses palpable, CRT less than 3 seconds Protective sensation intact with Simms Weinstein monofilament Incision on the dorsal medial aspect of the right foot which is well coapted without any evidence of dehiscence. There is mild edema around the area without any associated erythema, increase in warmth, ascending cellulitis. There is no drainage or purulence. There is no tenderness palpation overlying the incision. No pain with first MTPJ range of motion. The hallux is in a rectus position. No other areas of tenderness to bilateral lower extremities. No open lesions or pre-ulcer lesions identified elsewhere. No pain with calf compression, swelling, warmth, erythema.  Assessment: 34 year old female with s/p Austin bunionectomy; improving.   Plan: -X-rays were obtained and reviewed with the patient. There does appear to be some displacement the osteotomy compared to last appointment. I discussed this finding with the patient. Currently the toe sits in a rectus position and there is good first MTPJ range of motion. -Treatment options discussed including all alternatives, risks, and complications  -Continue with CAM walker at all times.  -Ice and elevation. -Monitor for any clinical signs or symptoms of infection and  directed to call the office immediately should any occur or go to the ER. -Follow-up 2 weeks or sooner if any problems arise. In the meantime, encouraged to call the office with any questions, concerns, change in symptoms. *x-ray next appointment.   [name], DPM

## 2015-07-18 ENCOUNTER — Ambulatory Visit: Payer: Medicaid Other | Attending: Family Medicine | Admitting: Family Medicine

## 2015-07-18 ENCOUNTER — Telehealth: Payer: Self-pay | Admitting: *Deleted

## 2015-07-18 ENCOUNTER — Encounter: Payer: Self-pay | Admitting: Family Medicine

## 2015-07-18 VITALS — BP 126/83 | HR 90 | Temp 98.7°F | Resp 16 | Ht 61.0 in | Wt 150.0 lb

## 2015-07-18 DIAGNOSIS — H547 Unspecified visual loss: Secondary | ICD-10-CM | POA: Diagnosis not present

## 2015-07-18 DIAGNOSIS — Z0000 Encounter for general adult medical examination without abnormal findings: Secondary | ICD-10-CM | POA: Diagnosis not present

## 2015-07-18 DIAGNOSIS — R51 Headache: Secondary | ICD-10-CM | POA: Diagnosis not present

## 2015-07-18 DIAGNOSIS — J309 Allergic rhinitis, unspecified: Secondary | ICD-10-CM | POA: Diagnosis not present

## 2015-07-18 DIAGNOSIS — R519 Headache, unspecified: Secondary | ICD-10-CM

## 2015-07-18 MED ORDER — FLUTICASONE PROPIONATE 50 MCG/ACT NA SUSP: 2.0000 | Freq: Every day | NASAL | Status: DC

## 2015-07-18 NOTE — Patient Instructions (Addendum)
Jessica Case,  Thank you for coming in today  1. Headache: back of head Pain in back of head without neck pain is most likely occipital neuralgia To treat pain: cold back 2-3 times per day, heat is helpful for some  Main treatment is occipital nerve block,  Neurology referral for possible nerve block Other treatments: carbamazepine, gabapentin and tricyclic antidepressants  2. Vision is poor: referral to optometry   3. Smoking cessation support: smoking cessation hotline: 1-800-QUIT-NOW.  Smoking cessation classes are available through [facility]. Call [phone] or visit our website at [email or web address]  F/u in 4 weeks for headaches   Dr. Adrian Blackwater   Occipital Neuralgia Occipital neuralgia is a type of headache that causes episodes of very bad pain in the back of your head. Pain from occipital neuralgia may spread (radiate) to other parts of your head. The pain is usually brief and often goes away after you rest and relax. These headaches may be caused by irritation of the nerves that leave your spinal cord high up in your neck, just below the base of your skull (occipital nerves). Your occipital nerves transmit sensations from the back of your head, the top of your head, and the areas behind your ears. CAUSES Occipital neuralgia can occur without any known cause (primary headache syndrome). In other cases, occipital neuralgia is caused by pressure on or irritation of one of the two occipital nerves. Causes of occipital nerve compression or irritation include:  Wear and tear of the vertebrae in the neck (osteoarthritis).  Neck injury.  Disease of the disks that separate the vertebrae.  Tumors.  Gout.  Infections.  Diabetes.  Swollen blood vessels that put pressure on the occipital nerves.  Muscle spasm in the neck. SIGNS AND SYMPTOMS Pain is the main symptom of occipital neuralgia. It usually starts in the back of the head but may also be  felt in other areas supplied by the occipital nerves. Pain is usually on one side but may be on both sides. You may have:   Brief episodes of very bad pain that is burning, stabbing, shocking, or shooting.  Pain behind the eye.  Pain triggered by neck movement or hair brushing.  Scalp tenderness.  Aching in the back of the head between episodes of very bad pain. DIAGNOSIS  Your health care provider may diagnose occipital neuralgia based on your symptoms and a physical exam. During the exam, the health care provider may push on areas supplied by the occipital nerves to see if they are painful. Some tests may also be done to help in making the diagnosis. These may include:  Imaging studies of the upper spinal cord, such as an MRI or CT scan. These may show compression or spinal cord abnormalities.  Nerve block. You will get an injection of numbing medicine (local anesthetic) near the occipital nerve to see if this relieves pain. TREATMENT  Treatment may begin with simple measures, such as:   Rest.  Massage.  Heat.  Over-the-counter pain relievers. If these measures do not work, you may need other treatments, including:  Medicines such as:  Prescription-strength anti-inflammatory medicines.  Muscle relaxants.  Antiseizure medicines.  Antidepressants.  Steroid injection. This involves injections of local anesthetic and strong anti-inflammatory drugs (steroids).  Pulsed radiofrequency. Wires are implanted to deliver electrical impulses that block pain signals from the occipital nerve.  Physical therapy.  Surgery to relieve nerve pressure. HOME CARE INSTRUCTIONS  Take all medicines as directed by your health  care provider.  Avoid activities that cause pain.  Rest when you have an attack of pain.  Try gentle massage or a heating pad to relieve pain.  Work with a physical therapist to learn stretching exercises you can do at home.  Try a different pillow or sleeping  position.  Practice good posture.  Try to stay active. Get regular exercise that does not cause pain. Ask your health care provider to suggest safe exercises for you.  Keep all follow-up visits as directed by your health care provider. This is important. SEEK MEDICAL CARE IF:  Your medicine is not working.  You have new or worsening symptoms. SEEK IMMEDIATE MEDICAL CARE IF:  You have very bad head pain that is not going away.  You have a sudden change in vision, balance, or speech. MAKE SURE YOU:  Understand these instructions.  Will watch your condition.  Will get help right away if you are not doing well or get worse. Document Released: 09/29/2001 Document Revised: 02/19/2014 Document Reviewed: 09/27/2013 [facility] Patient Information 2015 [place], [place]. This information is not intended to replace advice given to you by your health care provider. Make sure you discuss any questions you have with your health care provider.

## 2015-07-18 NOTE — Progress Notes (Signed)
Subjective:  Patient ID: Jessica Case, female    DOB: 1981-10-07  Age: 34 y.o. MRN: [id number]  CC: Headache  HPI Kisha Messman presents for   1. Headache: 4 weeks. Occipital. Sharp pains. 45 minutes. 3-4 times per week. Associated with nausea and sensitivity to sound. No emesis, sensitivity to light or weakness. Patient has hx of headaches previously treated with propranolol. These were frontal headaches. She also endorses fam hx of cerebral aneurysms on her father's side. She is a smoker of 2-3 cigs per day. No HTN.    Social History  Substance Use Topics  . Smoking status: Current Every Day Smoker -- 0.10 packs/day    Types: Cigarettes  . Smokeless tobacco: Never Used  . Alcohol Use: 0.0 oz/week    0 Standard drinks or equivalent per week     Comment: occ beer    Outpatient Prescriptions Prior to Visit  Medication Sig Dispense Refill  . acetaminophen-codeine (TYLENOL #3) 300-30 MG per tablet Take 1 tablet by mouth every 8 (eight) hours as needed for moderate pain. 60 tablet 0  . HYDROcodone-acetaminophen (NORCO/VICODIN) 5-325 MG per tablet Take by mouth. for pain  0  . lactobacillus acidophilus & bulgar (LACTINEX) chewable tablet TAKE 1 TABLET BY MOUTH 3 (THREE) TIMES DAILY.  2  . loratadine (CLARITIN) 10 MG tablet Take 1 tablet (10 mg total) by mouth daily. 30 tablet 3  . metroNIDAZOLE (FLAGYL) 500 MG tablet Take 1 tablet (500 mg total) by mouth 2 (two) times daily. 14 tablet 0  . oxyCODONE-acetaminophen (PERCOCET) 10-325 MG per tablet Take 1 tablet by mouth every 6 (six) hours as needed for pain. MAXIMUM TOTAL ACETAMINOPHEN DOSE IS 4000 MG PER DAY 40 tablet 0  . oxyCODONE-acetaminophen (ROXICET) 5-325 MG per tablet Take 1 tablet by mouth every 8 (eight) hours as needed for severe pain. 20 tablet 0  . promethazine (PHENERGAN) 12.5 MG tablet TAKE 1 TABLET BY MOUTH EVERY 6 HOURS AS NEEDED NAUSEA AND VOMITING  0  . traZODone (DESYREL) 150 MG tablet Take 150 mg by mouth at  bedtime.    . ziprasidone (GEODON) 40 MG capsule Take 40 mg by mouth 2 (two) times daily with a meal.    . amoxicillin (AMOXIL) 500 MG capsule Take 500 mg by mouth 3 (three) times daily.    . cephALEXin (KEFLEX) 500 MG capsule TAKE ONE CAPSULE BY MOUTH 3 TIMES A DAY FOR 7 DAYS  0  . cephALEXin (KEFLEX) 500 MG capsule Take 1 capsule (500 mg total) by mouth 3 (three) times daily. (Patient not taking: Reported on 07/18/2015) 30 capsule 2  . fluconazole (DIFLUCAN) 150 MG tablet Take 1 tablet (150 mg total) by mouth once. (Patient not taking: Reported on 07/18/2015) 1 tablet 0  . Lactobacillus (ACIDOPHILUS PROBIOTIC) 10 MG TABS Take 1 tablet by mouth 3 (three) times daily. (Patient not taking: Reported on 07/18/2015) 90 tablet 2  . polyethylene glycol powder (GLYCOLAX/MIRALAX) powder Take 17 g by mouth 2 (two) times daily as needed. (Patient not taking: Reported on 07/18/2015) 3350 g 1   No facility-administered medications prior to visit.    ROS Review of Systems  Constitutional: Negative for fever, chills, activity change and appetite change.  HENT: Positive for congestion. Negative for ear discharge, ear pain, hearing loss and sinus pressure.   Eyes: Positive for visual disturbance. Negative for photophobia, pain, discharge, redness and itching.  Respiratory: Negative for shortness of breath.   Cardiovascular: Negative for chest pain.  Gastrointestinal: Negative for  abdominal pain and blood in stool.  Musculoskeletal: Negative for back pain and arthralgias.  Skin: Negative for rash.  Allergic/Immunologic: Negative for immunocompromised state.  Neurological: Positive for headaches. Negative for dizziness, tremors, seizures, syncope, facial asymmetry, speech difficulty, weakness, light-headedness and numbness.  Hematological: Negative for adenopathy. Does not bruise/bleed easily.  Psychiatric/Behavioral: Positive for sleep disturbance. Negative for suicidal ideas and dysphoric mood.    Objective:   BP 126/83 mmHg  Pulse 90  Temp(Src) 98.7 F (37.1 C) (Oral)  Resp 16  Ht 5\' 1"  (1.549 m)  Wt 150 lb (68.04 kg)  BMI 28.36 kg/m2  SpO2 99%  LMP 07/09/2015  BP/Weight 07/18/2015 3/33/8329 10/27/1658  Systolic BP 600 459 977  Diastolic BP 83 83 414  Wt. (Lbs) 150 - -  BMI 28.36 - -   Physical Exam  Constitutional: She is oriented to person, place, and time. She appears well-developed and well-nourished. No distress.  HENT:  Head: Normocephalic and atraumatic.  Nose: Mucosal edema present.  Cardiovascular: Normal rate, regular rhythm, normal heart sounds and intact distal pulses.   Pulmonary/Chest: Effort normal and breath sounds normal.  Musculoskeletal: She exhibits no edema.  Neurological: She is alert and oriented to person, place, and time.  Skin: Skin is warm and dry. No rash noted.  Psychiatric: She has a normal mood and affect.     Assessment & Plan:   Problem List Items Addressed This Visit    Allergic rhinitis   Relevant Medications   fluticasone (FLONASE) 50 MCG/ACT nasal spray   Recurrent occipital headache   Relevant Orders   Ambulatory referral to Neurology   Vision decreased   Relevant Orders   Ambulatory referral to Optometry    Other Visit Diagnoses    Healthcare maintenance    -  Primary    Relevant Orders    Flu Vaccine QUAD 36+ mos IM (Completed)       No orders of the defined types were placed in this encounter.    Follow-up: No Follow-up on file.   Boykin Nearing MD

## 2015-07-18 NOTE — Telephone Encounter (Signed)
Jessica Case states pt' records were requested in July, but not received.  Fax records to [phone] or call [phone], reference to Case [phone].

## 2015-07-18 NOTE — Progress Notes (Signed)
C/C Migraine  Stated Hx of aneurism in the family

## 2015-07-22 ENCOUNTER — Ambulatory Visit (INDEPENDENT_AMBULATORY_CARE_PROVIDER_SITE_OTHER): Payer: Medicaid Other | Admitting: Podiatry

## 2015-07-22 ENCOUNTER — Ambulatory Visit (INDEPENDENT_AMBULATORY_CARE_PROVIDER_SITE_OTHER): Payer: Medicaid Other

## 2015-07-22 ENCOUNTER — Encounter: Payer: Self-pay | Admitting: Podiatry

## 2015-07-22 VITALS — BP 134/85 | HR 75 | Resp 16

## 2015-07-22 DIAGNOSIS — M216X1 Other acquired deformities of right foot: Secondary | ICD-10-CM

## 2015-07-22 DIAGNOSIS — M21611 Bunion of right foot: Secondary | ICD-10-CM

## 2015-07-22 DIAGNOSIS — Z9889 Other specified postprocedural states: Secondary | ICD-10-CM

## 2015-07-22 MED ORDER — OXYCODONE-ACETAMINOPHEN 5-325 MG PO TABS: 1.0000 | ORAL_TABLET | Freq: Three times a day (TID) | ORAL | Status: DC | PRN

## 2015-07-24 NOTE — Progress Notes (Signed)
Patient ID: Jessica Case, female   DOB: Jun 26, 1981, 34 y.o.   MRN: [id number]  Subjective: 34 year old female presents the office today status post right foot Austin bunionectomy. She states that her right foot on the surgery site is painful with prolonged weightbearing. She does is improved somewhat over the last couple days oh discontinued and painful. She gets swelling around the area intermittent leg. No sensory erythema or increase in warmth. No other acute changes since last upon it. Denies any systemic complaints such as fevers, chills, nausea, vomiting. No calf pain, chest pain, shortness of breath.  Objective: AAO 3, NAD; presents in CAM boot  DP/PT pulses palpable, CRT less than 3 seconds Protective sensation intact with Simms Weinstein monofilament Incision on the dorsal medial aspect of the right foot which is well coapted without any evidence of dehiscence and a scar has formed. There is mild edema around the area without any associated erythema, increase in warmth, ascending cellulitis. There is no drainage or purulence. There is mild tenderness palpation overlying the incision. No pain with first MTPJ range of motion. The hallux is in a rectus position. No other areas of tenderness to bilateral lower extremities. No open lesions or pre-ulcer lesions identified elsewhere. No pain with calf compression, swelling, warmth, erythema.  Assessment: 34 year old female with s/p Austin bunionectomy; with pain   Plan: -X-rays were obtained and reviewed with the patient. I discussed with the malalignment of the first MTPJ. She does have good range of motion of the joint at this time and the toe sits in a rectus position. I discussed with her that in the future she may require further surgical intervention as she'll likely develop arthritis. She was still off on any further surgery at this time. -Treatment options discussed including all alternatives, risks, and complications  -She has her to  transition to a regular shoe as tolerated. There is any increase in pain to return to the CAM boot. -Ice and elevation. -Monitor for any clinical signs or symptoms of infection and directed to call the office immediately should any occur or go to the ER. -Follow-up 3 weeks or sooner if any problems arise. In the meantime, encouraged to call the office with any questions, concerns, change in symptoms. *x-ray next appointment.   [name], DPM

## 2015-07-25 ENCOUNTER — Emergency Department (HOSPITAL_COMMUNITY): Payer: Medicaid Other

## 2015-07-25 ENCOUNTER — Emergency Department (HOSPITAL_COMMUNITY)
Admission: EM | Admit: 2015-07-25 | Discharge: 2015-07-25 | Disposition: A | Discharge status: Home / Self Care | Payer: Medicaid Other | Attending: Emergency Medicine | Admitting: Emergency Medicine

## 2015-07-25 ENCOUNTER — Encounter (HOSPITAL_COMMUNITY): Payer: Self-pay | Admitting: Emergency Medicine

## 2015-07-25 DIAGNOSIS — F200 Paranoid schizophrenia: Secondary | ICD-10-CM | POA: Insufficient documentation

## 2015-07-25 DIAGNOSIS — Z720 Tobacco use: Secondary | ICD-10-CM | POA: Insufficient documentation

## 2015-07-25 DIAGNOSIS — M542 Cervicalgia: Secondary | ICD-10-CM | POA: Insufficient documentation

## 2015-07-25 DIAGNOSIS — G4489 Other headache syndrome: Secondary | ICD-10-CM | POA: Insufficient documentation

## 2015-07-25 DIAGNOSIS — Z79899 Other long term (current) drug therapy: Secondary | ICD-10-CM | POA: Insufficient documentation

## 2015-07-25 DIAGNOSIS — Z8742 Personal history of other diseases of the female genital tract: Secondary | ICD-10-CM | POA: Diagnosis not present

## 2015-07-25 DIAGNOSIS — H538 Other visual disturbances: Secondary | ICD-10-CM | POA: Diagnosis present

## 2015-07-25 DIAGNOSIS — Z7951 Long term (current) use of inhaled steroids: Secondary | ICD-10-CM | POA: Diagnosis not present

## 2015-07-25 MED ORDER — SODIUM CHLORIDE 0.9 % IV BOLUS (SEPSIS)
1000.0000 mL | Freq: Once | INTRAVENOUS | Status: AC
  Administered 2015-07-25: 1000 mL via INTRAVENOUS

## 2015-07-25 MED ORDER — PROCHLORPERAZINE EDISYLATE 5 MG/ML IJ SOLN
10.0000 mg | Freq: Once | INTRAMUSCULAR | Status: AC
Start: 2015-07-25 — End: 2015-07-25
  Administered 2015-07-25: 10 mg via INTRAVENOUS
  Filled 2015-07-25: qty 2

## 2015-07-25 MED ORDER — DIPHENHYDRAMINE HCL 50 MG/ML IJ SOLN
25.0000 mg | Freq: Once | INTRAMUSCULAR | Status: AC
  Administered 2015-07-25: 25 mg via INTRAVENOUS
  Filled 2015-07-25: qty 1

## 2015-07-25 MED ORDER — IBUPROFEN 800 MG PO TABS
800.0000 mg | ORAL_TABLET | Freq: Once | ORAL | Status: AC
  Administered 2015-07-25: 800 mg via ORAL
  Filled 2015-07-25: qty 1

## 2015-07-25 NOTE — ED Provider Notes (Signed)
CSN: [id number]     Arrival date & time 07/25/15  4920 History   First MD Initiated Contact with Patient 07/25/15 [phone]     Chief Complaint  Patient presents with  . Headache     (Consider location/radiation/quality/duration/timing/severity/associated sxs/prior Treatment) Patient is a 34 y.o. female presenting with headaches. The history is provided by the patient.  Headache Pain location:  Occipital Quality:  Sharp Radiates to:  Does not radiate Severity currently:  10/10 Severity at highest:  10/10 Onset quality:  Gradual Duration:  1 week Timing:  Constant Progression:  Worsening Chronicity:  New Similar to prior headaches: no   Relieved by:  Nothing Worsened by:  Nothing Ineffective treatments:  None tried Associated symptoms: blurred vision (transiently) and neck pain   Associated symptoms: no congestion, no dizziness, no fever, no myalgias, no nausea and no vomiting   Risk factors: family hx of SAH    34 yo F with a chief complaint headache. This been going on for about a week. Patient is seen her PCP for this. There is posterior for her to neurology but she has not heard back. Patient states it's worse in the morning pain is occipital. Patient states that she has a family history of multiple ruptured aneurysms in the past. Has had some blurry vision with this but has been transient. No current visual issues. Patient has fevers or chills denies neck stiffness. Denies recent injury. Patient states she normally does not get headaches.  Past Medical History  Diagnosis Date  . Fallopian tube abscess   . Paranoid schizophrenia ([place])   . Claustrophobia    Past Surgical History  Procedure Laterality Date  . Tooth extraction     Family History  Problem Relation Age of Onset  . Hypertension Mother    Social History  Substance Use Topics  . Smoking status: Current Every Day Smoker -- 0.10 packs/day    Types: Cigarettes  . Smokeless tobacco: Never Used  . Alcohol Use: 0.0  oz/week    0 Standard drinks or equivalent per week     Comment: occ beer    OB History    Gravida Para Term Preterm AB TAB SAB Ectopic Multiple Living   2 1 0 1 1 0 1   0     Review of Systems  Constitutional: Negative for fever and chills.  HENT: Negative for congestion and rhinorrhea.   Eyes: Positive for blurred vision (transiently). Negative for redness and visual disturbance.  Respiratory: Negative for shortness of breath and wheezing.   Cardiovascular: Negative for chest pain and palpitations.  Gastrointestinal: Negative for nausea and vomiting.  Genitourinary: Negative for dysuria and urgency.  Musculoskeletal: Positive for neck pain. Negative for myalgias and arthralgias.  Skin: Negative for pallor and wound.  Neurological: Positive for headaches. Negative for dizziness.      Allergies  Citrus; Strawberry; and Toradol  Home Medications   Prior to Admission medications   Medication Sig Start Date End Date Taking? Authorizing Provider  fluticasone (FLONASE) 50 MCG/ACT nasal spray Place 2 sprays into both nostrils daily. 07/18/15   Josalyn Funches, MD  lactobacillus acidophilus & bulgar (LACTINEX) chewable tablet TAKE 1 TABLET BY MOUTH 3 (THREE) TIMES DAILY. 03/07/15   Historical Provider, MD  loratadine (CLARITIN) 10 MG tablet Take 1 tablet (10 mg total) by mouth daily. 03/07/15   Josalyn Funches, MD  oxyCODONE-acetaminophen (ROXICET) 5-325 MG tablet Take 1 tablet by mouth every 8 (eight) hours as needed for severe pain. 07/22/15  Trula Slade, DPM  promethazine (PHENERGAN) 12.5 MG tablet TAKE 1 TABLET BY MOUTH EVERY 6 HOURS AS NEEDED NAUSEA AND VOMITING 06/05/15   Historical Provider, MD  traZODone (DESYREL) 150 MG tablet Take 150 mg by mouth at bedtime.    Historical Provider, MD  ziprasidone (GEODON) 40 MG capsule Take 40 mg by mouth 2 (two) times daily with a meal.    Historical Provider, MD   BP 116/66 mmHg  Pulse 65  Temp(Src) 98.3 F (36.8 C)  Resp 18  Ht 5'  1" (1.549 m)  Wt 150 lb (68.04 kg)  BMI 28.36 kg/m2  SpO2 98%  LMP 07/09/2015 Physical Exam  Constitutional: She is oriented to person, place, and time. She appears well-developed and well-nourished. No distress.  HENT:  Head: Normocephalic and atraumatic.  Tender to palpation about the paraspinal muscles attaching to the base of the skull.  Eyes: EOM are normal. Pupils are equal, round, and reactive to light.  Neck: Normal range of motion. Neck supple.  Cardiovascular: Normal rate and regular rhythm.  Exam reveals no gallop and no friction rub.   No murmur heard. Pulmonary/Chest: Effort normal. She has no wheezes. She has no rales.  Abdominal: Soft. She exhibits no distension. There is no tenderness. There is no rebound and no guarding.  Musculoskeletal: She exhibits no edema or tenderness.  Neurological: She is alert and oriented to person, place, and time. She has normal strength. No cranial nerve deficit or sensory deficit. She displays a negative Romberg sign. Coordination and gait normal. GCS eye subscore is 4. GCS verbal subscore is 5. GCS motor subscore is 6. She displays no Babinski's sign on the right side. She displays no Babinski's sign on the left side.  Reflex Scores:      Tricep reflexes are 2+ on the right side and 2+ on the left side.      Bicep reflexes are 2+ on the right side and 2+ on the left side.      Brachioradialis reflexes are 2+ on the right side and 2+ on the left side.      Patellar reflexes are 2+ on the right side and 2+ on the left side.      Achilles reflexes are 2+ on the right side and 2+ on the left side. Benign neuro exam  Skin: Skin is warm and dry. She is not diaphoretic.  Psychiatric: She has a normal mood and affect. Her behavior is normal.    ED Course  Procedures (including critical care time) Labs Review Labs Reviewed - No data to display  Imaging Review Ct Head Wo Contrast  07/25/2015   CLINICAL DATA:  Headache for 1 week worse today,  some blurry vision, smoker  EXAM: CT HEAD WITHOUT CONTRAST  TECHNIQUE: Contiguous axial images were obtained from the base of the skull through the vertex without intravenous contrast.  COMPARISON:  None  FINDINGS: Normal ventricular morphology.  No midline shift or mass effect.  Normal appearance of brain parenchyma.  No intracranial hemorrhage, mass lesion, or acute infarction.  Visualized paranasal sinuses and mastoid air cells clear.  Bones unremarkable.  IMPRESSION: Normal exam.   Electronically Signed   By: Lavonia Dana M.D.   On: 07/25/2015 08:23   I have personally reviewed and evaluated these images and lab results as part of my medical decision-making.   EKG Interpretation None      MDM   Final diagnoses:  Other headache syndrome    34 yo F  with a chief complaint of a headache. Patient with no history of headaches like this. Also the history of aneurysms in the family. CT head negative. Headache drastically improved with migraine cocktail. Will discharge home.  8:53 AM:  I have discussed the diagnosis/risks/treatment options with the patient and family and believe the pt to be eligible for discharge home to follow-up with PCP, neuro. We also discussed returning to the ED immediately if new or worsening sx occur. We discussed the sx which are most concerning (e.g., sudden worsening pain, fever) that necessitate immediate return. Medications administered to the patient during their visit and any new prescriptions provided to the patient are listed below.  Medications given during this visit Medications  prochlorperazine (COMPAZINE) injection 10 mg (10 mg Intravenous Given 07/25/15 0754)  diphenhydrAMINE (BENADRYL) injection 25 mg (25 mg Intravenous Given 07/25/15 0754)  sodium chloride 0.9 % bolus 1,000 mL (1,000 mLs Intravenous New Bag/Given 07/25/15 0754)  ibuprofen (ADVIL,MOTRIN) tablet 800 mg (800 mg Oral Given 07/25/15 0755)    New Prescriptions   No medications on file    The  patient appears reasonably screen and/or stabilized for discharge and I doubt any other medical condition or other [facility] requiring further screening, evaluation, or treatment in the ED at this time prior to discharge.      Deno Etienne, DO 07/25/15 [phone]

## 2015-07-25 NOTE — ED Notes (Signed)
Pt reports- " I have head back lower head pain for about a week. It started yesterday." Pt denies injury, states she woke up like this. Pt reports some blurry vision but none at present.

## 2015-07-25 NOTE — ED Notes (Signed)
Patient's family member Marden Noble) left his number and requested that we call him to pick up patient when she is ready for discharge [phone])

## 2015-07-25 NOTE — Discharge Instructions (Signed)

## 2015-07-26 NOTE — Telephone Encounter (Signed)
Waiting on a call back from [facility] in regards to Case #[id number] to see what all information they need.

## 2015-07-30 ENCOUNTER — Telehealth: Payer: Self-pay | Admitting: *Deleted

## 2015-07-30 NOTE — Telephone Encounter (Signed)
Jessica Case called [facility] request for medical record to be sent.

## 2015-08-08 NOTE — Telephone Encounter (Signed)
Medical records have been completed and taken care of.

## 2015-08-12 ENCOUNTER — Ambulatory Visit (INDEPENDENT_AMBULATORY_CARE_PROVIDER_SITE_OTHER): Payer: Medicaid Other | Admitting: Podiatry

## 2015-08-12 ENCOUNTER — Encounter: Payer: Self-pay | Admitting: Podiatry

## 2015-08-12 ENCOUNTER — Ambulatory Visit (INDEPENDENT_AMBULATORY_CARE_PROVIDER_SITE_OTHER): Payer: Medicaid Other

## 2015-08-12 VITALS — BP 144/92 | HR 77 | Resp 18

## 2015-08-12 DIAGNOSIS — M21611 Bunion of right foot: Secondary | ICD-10-CM

## 2015-08-12 DIAGNOSIS — M216X1 Other acquired deformities of right foot: Secondary | ICD-10-CM | POA: Diagnosis not present

## 2015-08-12 DIAGNOSIS — Z9889 Other specified postprocedural states: Secondary | ICD-10-CM

## 2015-08-12 MED ORDER — OXYCODONE-ACETAMINOPHEN 5-325 MG PO TABS: 1.0000 | ORAL_TABLET | Freq: Three times a day (TID) | ORAL | Status: DC | PRN

## 2015-08-12 NOTE — Progress Notes (Signed)
Patient ID: Jessica Case, female   DOB: [birth year], 34 y.o.   MRN: [id number]  Subjective: 34 year old female presents the office today status post right foot Austin bunionectomy.  She states that she does have some swelling over the area as well as some pain. She has continue with the surgical shoe the majority the time of that she does go without it at times. She states that she does continue swelling without any redness or increase in warmth. She states incisions remain closed and notany drainage. No other complaints at this time in no acute changes. She denies any systemic complaints such as fevers, chills, nausea, vomiting. No calf pain, chest pain, soreness of breath.  Objective: AAO 3, NAD; presents in CAM boot  DP/PT pulses palpable, CRT less than 3 seconds Protective sensation intact with Simms Weinstein monofilament Incision on the dorsal medial aspect of the right foot which is well coapted without any evidence of dehiscence and a scar has formed. There is mild continued edema around the surgical site without any associated erythema, increase in warmth, ascending cellulitis. There is no drainage or purulence. There is mild tenderness palpation overlying the incision which continues. No pain with first MTPJ range of motion. The hallux is in a rectus position. No other areas of tenderness to bilateral lower extremities. No open lesions or pre-ulcer lesions identified elsewhere. No pain with calf compression, swelling, warmth, erythema.  Assessment: 34 year old female with s/p Austin bunionectomy; with continued pain and swelling.   Plan: -X-rays were obtained and reviewed with the patient  I again discuss these findings the patient I showed her x-rays today. -Treatment options discussed including all alternatives, risks, and complications - As the pain decreases she conservative transition back into regular shoe as tolerated. Is any increase in pain with a regular shoe to return to the  surgical shoe for cam boot. - continue ice and elevation. - hold off on any high-impact activities or exercising. -Monitor for any clinical signs or symptoms of infection and directed to call the office immediately should any occur or go to the ER. -Follow-up 3 weeks or sooner if any problems arise. In the meantime, encouraged to call the office with any questions, concerns, change in symptoms. *x-ray next appointment.   [name], DPM

## 2015-08-15 ENCOUNTER — Encounter (HOSPITAL_COMMUNITY): Payer: Self-pay | Admitting: Emergency Medicine

## 2015-08-15 ENCOUNTER — Ambulatory Visit: Payer: Medicaid Other | Attending: Family Medicine | Admitting: Family Medicine

## 2015-08-15 ENCOUNTER — Encounter: Payer: Self-pay | Admitting: Family Medicine

## 2015-08-15 ENCOUNTER — Emergency Department (HOSPITAL_COMMUNITY)
Admission: EM | Admit: 2015-08-15 | Discharge: 2015-08-15 | Disposition: A | Discharge status: Home / Self Care | Payer: Medicaid Other | Attending: Emergency Medicine | Admitting: Emergency Medicine

## 2015-08-15 VITALS — BP 128/85 | HR 81 | Temp 98.8°F | Resp 16 | Ht 61.0 in | Wt 147.0 lb

## 2015-08-15 DIAGNOSIS — Z7951 Long term (current) use of inhaled steroids: Secondary | ICD-10-CM | POA: Insufficient documentation

## 2015-08-15 DIAGNOSIS — Z79899 Other long term (current) drug therapy: Secondary | ICD-10-CM | POA: Diagnosis not present

## 2015-08-15 DIAGNOSIS — H547 Unspecified visual loss: Secondary | ICD-10-CM

## 2015-08-15 DIAGNOSIS — Z87448 Personal history of other diseases of urinary system: Secondary | ICD-10-CM | POA: Insufficient documentation

## 2015-08-15 DIAGNOSIS — R519 Headache, unspecified: Secondary | ICD-10-CM

## 2015-08-15 DIAGNOSIS — R51 Headache: Secondary | ICD-10-CM | POA: Diagnosis present

## 2015-08-15 DIAGNOSIS — G43001 Migraine without aura, not intractable, with status migrainosus: Secondary | ICD-10-CM | POA: Diagnosis not present

## 2015-08-15 DIAGNOSIS — F17210 Nicotine dependence, cigarettes, uncomplicated: Secondary | ICD-10-CM | POA: Diagnosis not present

## 2015-08-15 DIAGNOSIS — F200 Paranoid schizophrenia: Secondary | ICD-10-CM | POA: Insufficient documentation

## 2015-08-15 DIAGNOSIS — G43901 Migraine, unspecified, not intractable, with status migrainosus: Secondary | ICD-10-CM | POA: Insufficient documentation

## 2015-08-15 MED ORDER — DIPHENHYDRAMINE HCL 50 MG/ML IJ SOLN
25.0000 mg | Freq: Once | INTRAMUSCULAR | Status: AC
  Administered 2015-08-15: 25 mg via INTRAVENOUS
  Filled 2015-08-15: qty 1

## 2015-08-15 MED ORDER — METOCLOPRAMIDE HCL 5 MG/ML IJ SOLN
10.0000 mg | Freq: Once | INTRAMUSCULAR | Status: AC
  Administered 2015-08-15: 10 mg via INTRAVENOUS
  Filled 2015-08-15: qty 2

## 2015-08-15 MED ORDER — SODIUM CHLORIDE 0.9 % IV BOLUS (SEPSIS)
1000.0000 mL | Freq: Once | INTRAVENOUS | Status: AC
  Administered 2015-08-15: 1000 mL via INTRAVENOUS

## 2015-08-15 MED ORDER — SUMATRIPTAN SUCCINATE 50 MG PO TABS: 50.0000 mg | ORAL_TABLET | ORAL | Status: DC | PRN

## 2015-08-15 MED ORDER — DIPHENHYDRAMINE HCL 25 MG PO CAPS
25.0000 mg | ORAL_CAPSULE | Freq: Once | ORAL | Status: AC
  Administered 2015-08-15: 25 mg via ORAL

## 2015-08-15 MED ORDER — METHYLPREDNISOLONE ACETATE 80 MG/ML IJ SUSP
60.0000 mg | Freq: Once | INTRAMUSCULAR | Status: AC
  Administered 2015-08-15: 60 mg via INTRAMUSCULAR

## 2015-08-15 MED ORDER — PROMETHAZINE HCL 25 MG/ML IJ SOLN
25.0000 mg | Freq: Once | INTRAMUSCULAR | Status: AC
  Administered 2015-08-15: 25 mg via INTRAMUSCULAR

## 2015-08-15 NOTE — Discharge Instructions (Signed)
Follow up with your doctor or neurologist for further management of your headache  Recurrent Migraine Headache A migraine headache is an intense, throbbing pain on one or both sides of your head. Recurrent migraines keep coming back. A migraine can last for 30 minutes to several hours. CAUSES  The exact cause of a migraine headache is not always known. However, a migraine may be caused when nerves in the brain become irritated and release chemicals that cause inflammation. This causes pain. Certain things may also trigger migraines, such as:   Alcohol.  Smoking.  Stress.  Menstruation.  Aged cheeses.  Foods or drinks that contain nitrates, glutamate, aspartame, or tyramine.  Lack of sleep.  Chocolate.  Caffeine.  Hunger.  Physical exertion.  Fatigue.  Medicines used to treat chest pain (nitroglycerine), birth control pills, estrogen, and some blood pressure medicines. SYMPTOMS   Pain on one or both sides of your head.  Pulsating or throbbing pain.  Severe pain that prevents daily activities.  Pain that is aggravated by any physical activity.  Nausea, vomiting, or both.  Dizziness.  Pain with exposure to bright lights, loud noises, or activity.  General sensitivity to bright lights, loud noises, or smells. Before you get a migraine, you may get warning signs that a migraine is coming (aura). An aura may include:  Seeing flashing lights.  Seeing bright spots, halos, or zigzag lines.  Having tunnel vision or blurred vision.  Having feelings of numbness or tingling.  Having trouble talking.  Having muscle weakness. DIAGNOSIS  A recurrent migraine headache is often diagnosed based on:  Symptoms.  Physical examination.  A CT scan or MRI of your head. These imaging tests cannot diagnose migraines but can help rule out other causes of headaches.  TREATMENT  Medicines may be given for pain and nausea. Medicines can also be given to help prevent  recurrent migraines. HOME CARE INSTRUCTIONS  Only take over-the-counter or prescription medicines for pain or discomfort as directed by your health care provider. The use of long-term narcotics is not recommended.  Lie down in a dark, quiet room when you have a migraine.  Keep a journal to find out what may trigger your migraine headaches. For example, write down:  What you eat and drink.  How much sleep you get.  Any change to your diet or medicines.  Limit alcohol consumption.  Quit smoking if you smoke.  Get 7-9 hours of sleep, or as recommended by your health care provider.  Limit stress.  Keep lights dim if bright lights bother you and make your migraines worse. SEEK MEDICAL CARE IF:   You do not get relief from the medicines given to you.  You have a recurrence of pain.  You have a fever. SEEK IMMEDIATE MEDICAL CARE IF:  Your migraine becomes severe.  You have a stiff neck.  You have loss of vision.  You have muscular weakness or loss of muscle control.  You start losing your balance or have trouble walking.  You feel faint or pass out.  You have severe symptoms that are different from your first symptoms. MAKE SURE YOU:   Understand these instructions.  Will watch your condition.  Will get help right away if you are not doing well or get worse.   This information is not intended to replace advice given to you by your health care provider. Make sure you discuss any questions you have with your health care provider.   Document Released: 06/30/2001 Document Revised: 10/26/2014  Document Reviewed: 06/12/2013 Elsevier Interactive Patient Education [employer].

## 2015-08-15 NOTE — ED Notes (Signed)
Pt denies n/v visual impairments but does state that she has light sensitivity.

## 2015-08-15 NOTE — Patient Instructions (Signed)
Jessica Case was seen today for headache.  Diagnoses and all orders for this visit:  Migraine without aura and with status migrainosus, not intractable -     SUMAtriptan (IMITREX) 50 MG tablet; Take 1 tablet (50 mg total) by mouth every 2 (two) hours as needed for migraine. May repeat in 2 hours if headache persists or recurs. -     diphenhydrAMINE (BENADRYL) capsule 25 mg; Take 1 capsule (25 mg total) by mouth once. -     promethazine (PHENERGAN) injection 25 mg; Inject 1 mL (25 mg total) into the muscle once. -     methylPREDNISolone acetate (DEPO-MEDROL) injection 60 mg; Inject 0.75 mLs (60 mg total) into the muscle once.   Keep appt with neurologist  F/u with me in 6 weeks  Dr. Adrian Blackwater

## 2015-08-15 NOTE — Progress Notes (Signed)
Patient ID: Jessica Case, female   DOB: 1981-03-30, 34 y.o.   MRN: [id number]   Subjective:  Patient ID: Jessica Case, female    DOB: [birth year]  Age: 34 y.o. MRN: [id number]  CC: Headache  HPI Jessica Case presents for   1. Headache: persistent headache. Was occipital.  Now located at L temple. No trauma. Went to ED on 07/25/2015. Had normal CT head. Treated with headache cocktail which improved headache. Was unable to see eye doctor as they only treated patient 16 yo and younger. Poor vision, unchanging. No fever, nausea, emesis, weakness, tingling or numbness. Headache is described as sharp pain. Headache interferes with sleep. She has percocet for her R bunion which does not help with her headache. Current pain is 8/10.   Social History  Substance Use Topics  . Smoking status: Current Every Day Smoker -- 0.10 packs/day    Types: Cigarettes  . Smokeless tobacco: Never Used  . Alcohol Use: 0.0 oz/week    0 Standard drinks or equivalent per week     Comment: occ beer    Outpatient Prescriptions Prior to Visit  Medication Sig Dispense Refill  . fluticasone (FLONASE) 50 MCG/ACT nasal spray Place 2 sprays into both nostrils daily. 16 g 6  . lactobacillus acidophilus & bulgar (LACTINEX) chewable tablet TAKE 1 TABLET BY MOUTH 3 (THREE) TIMES DAILY.  2  . loratadine (CLARITIN) 10 MG tablet Take 1 tablet (10 mg total) by mouth daily. 30 tablet 3  . oxyCODONE-acetaminophen (ROXICET) 5-325 MG tablet Take 1 tablet by mouth every 8 (eight) hours as needed for severe pain. 20 tablet 0  . promethazine (PHENERGAN) 12.5 MG tablet TAKE 1 TABLET BY MOUTH EVERY 6 HOURS AS NEEDED NAUSEA AND VOMITING  0  . traZODone (DESYREL) 150 MG tablet Take 150 mg by mouth at bedtime.    . ziprasidone (GEODON) 40 MG capsule Take 40 mg by mouth 2 (two) times daily with a meal.     No facility-administered medications prior to visit.    ROS Review of Systems  Constitutional: Negative for fever, chills,  activity change and appetite change.  HENT: Positive for congestion. Negative for ear discharge, ear pain, hearing loss and sinus pressure.   Eyes: Positive for visual disturbance. Negative for photophobia, pain, discharge, redness and itching.  Respiratory: Negative for shortness of breath.   Cardiovascular: Negative for chest pain.  Gastrointestinal: Negative for abdominal pain and blood in stool.  Musculoskeletal: Negative for back pain and arthralgias.  Skin: Negative for rash.  Allergic/Immunologic: Negative for immunocompromised state.  Neurological: Positive for headaches. Negative for dizziness, tremors, seizures, syncope, facial asymmetry, speech difficulty, weakness, light-headedness and numbness.  Hematological: Negative for adenopathy. Does not bruise/bleed easily.  Psychiatric/Behavioral: Positive for sleep disturbance. Negative for suicidal ideas and dysphoric mood.    Objective:  BP 128/85 mmHg  Pulse 81  Temp(Src) 98.8 F (37.1 C) (Oral)  Resp 16  Ht 5\' 1"  (1.549 m)  Wt 147 lb (66.679 kg)  BMI 27.79 kg/m2  SpO2 98%  LMP 07/25/2015  BP/Weight 08/15/2015 08/12/2015 87/05/6766  Systolic BP 209 470 962  Diastolic BP 85 92 76  Wt. (Lbs) 147 - 150  BMI 27.79 - 28.36   Physical Exam  Constitutional: She is oriented to person, place, and time. She appears well-developed and well-nourished. No distress.  HENT:  Head: Normocephalic and atraumatic.  Right Ear: Tympanic membrane, external ear and ear canal normal.  Left Ear: Tympanic membrane, external ear and ear canal normal.  Cardiovascular: Normal rate, regular rhythm, normal heart sounds and intact distal pulses.   Pulmonary/Chest: Effort normal and breath sounds normal.  Musculoskeletal: She exhibits no edema.  Neurological: She is alert and oriented to person, place, and time. She has normal strength. No sensory deficit. Coordination normal.  Skin: Skin is warm and dry. No rash noted.  Psychiatric: She has a  normal mood and affect.   Gave migraine cocktail of phenergan 25 mg IM, depo medrol 60 mg IM and benardyl 25 mg PO.    Assessment & Plan:   Problem List Items Addressed This Visit    Migraine with status migrainosus - Primary   Relevant Medications   SUMAtriptan (IMITREX) 50 MG tablet   diphenhydrAMINE (BENADRYL) capsule 25 mg   promethazine (PHENERGAN) injection 25 mg   methylPREDNISolone acetate (DEPO-MEDROL) injection 60 mg   Vision decreased   Relevant Orders   Ambulatory referral to Optometry      No orders of the defined types were placed in this encounter.    Follow-up: No Follow-up on file.   Boykin Nearing MD  Gave migraine cocktail of reglan 10, decadron 10, and benardyl 25.

## 2015-08-15 NOTE — Progress Notes (Signed)
C/C Dry cough x1 wk and migraine HA x 2 wk  Pain scale #8 Hx tobacco 2 cigarette per day

## 2015-08-15 NOTE — ED Provider Notes (Signed)
CSN: [id number]     Arrival date & time 08/15/15  1656 History   First MD Initiated Contact with Patient 08/15/15 1723     Chief Complaint  Patient presents with  . Migraine     (Consider location/radiation/quality/duration/timing/severity/associated sxs/prior Treatment) HPI   34 year old female with history of paranoid schizophrenia, claustrophobia, and family history of subarachnoid hemorrhage presents for evaluation of headache. Patient report migraine headache ongoing for the past 2 weeks. She describes headache as a sharp aching sensation to the back of her head radiates towards the left parietal region which has been waxing and waning but has been persistent with in the past 5 hours. Report light sensitivity. Headache is similar to the headache that she has within the past month. She was seen on the sixth of this month for the same headache which improves after receiving a migraine cocktail. She follow-up with her primary care Dr. for this headache today and receive a shot of Toradol which provide minimal improvement. She has not try any specific treatment at home aside from resting. She is scheduled to be seen by a neurologist on November 16. At this time she denies having fever, chills, double vision, neck stiffness, URI symptoms, focal numbness or weakness, neck pain, chest pain shortness of breath. She denies any precipitating symptoms causing her headache.  Past Medical History  Diagnosis Date  . Fallopian tube abscess   . Paranoid schizophrenia ([place])   . Claustrophobia    Past Surgical History  Procedure Laterality Date  . Tooth extraction     Family History  Problem Relation Age of Onset  . Hypertension Mother    Social History  Substance Use Topics  . Smoking status: Current Every Day Smoker -- 0.10 packs/day    Types: Cigarettes  . Smokeless tobacco: Never Used  . Alcohol Use: 0.0 oz/week    0 Standard drinks or equivalent per week     Comment: occ beer    OB  History    Gravida Para Term Preterm AB TAB SAB Ectopic Multiple Living   2 1 0 1 1 0 1   0     Review of Systems  All other systems reviewed and are negative.     Allergies  Citrus; Strawberry extract; and Toradol  Home Medications   Prior to Admission medications   Medication Sig Start Date End Date Taking? Authorizing Provider  Aspirin-Salicylamide-Caffeine (BC HEADACHE POWDER PO) Take 1 packet by mouth daily as needed (headache/migraine).   Yes Historical Provider, MD  fluticasone (FLONASE) 50 MCG/ACT nasal spray Place 2 sprays into both nostrils daily. 07/18/15  Yes Josalyn Funches, MD  loratadine (CLARITIN) 10 MG tablet Take 1 tablet (10 mg total) by mouth daily. Patient taking differently: Take 10 mg by mouth daily as needed for allergies.  03/07/15  Yes Josalyn Funches, MD  oxyCODONE-acetaminophen (ROXICET) 5-325 MG tablet Take 1 tablet by mouth every 8 (eight) hours as needed for severe pain. 08/12/15  Yes Trula Slade, DPM  promethazine (PHENERGAN) 12.5 MG tablet TAKE 1 TABLET BY MOUTH EVERY 6 HOURS AS NEEDED NAUSEA AND VOMITING 06/05/15  Yes Historical Provider, MD  SUMAtriptan (IMITREX) 50 MG tablet Take 1 tablet (50 mg total) by mouth every 2 (two) hours as needed for migraine. May repeat in 2 hours if headache persists or recurs. 08/15/15  Yes Josalyn Funches, MD  traZODone (DESYREL) 150 MG tablet Take 150 mg by mouth at bedtime as needed for sleep.    Yes Historical Provider, MD  ziprasidone (GEODON) 40 MG capsule Take 40 mg by mouth 2 (two) times daily with a meal.   Yes Historical Provider, MD   BP 132/78 mmHg  Pulse 89  Temp(Src) 98.2 F (36.8 C) (Oral)  Resp 16  SpO2 96%  LMP 07/25/2015 Physical Exam  Constitutional: She is oriented to person, place, and time. She appears well-developed and well-nourished. No distress.  HENT:  Head: Atraumatic.  No scalp tenderness  Eyes: Conjunctivae and EOM are normal. Pupils are equal, round, and reactive to light.   Neck: Normal range of motion. Neck supple.  No nuchal rigidity  Cardiovascular: Normal rate and regular rhythm.   Pulmonary/Chest: Effort normal and breath sounds normal.  Abdominal: Soft. There is no tenderness.  Neurological: She is alert and oriented to person, place, and time.  Neurologic exam:  Speech clear, pupils equal round reactive to light, extraocular movements intact  Normal peripheral visual fields Cranial nerves III through XII normal including no facial droop Follows commands, moves all extremities x4, normal strength to bilateral upper and lower extremities at all major muscle groups including grip Sensation normal to light touch  Coordination intact, no limb ataxia, finger-nose-finger normal Rapid alternating movements normal No pronator drift Gait normal   Skin: No rash noted.  Psychiatric: She has a normal mood and affect.  Nursing note and vitals reviewed.   ED Course  Angiocath insertion Date/Time: 08/15/2015 6:17 PM Performed by: Domenic Moras Authorized by: Domenic Moras Consent: Verbal consent obtained. Consent given by: patient Patient understanding: patient states understanding of the procedure being performed Patient identity confirmed: verbally with patient Preparation: Patient was prepped and draped in the usual sterile fashion. Local anesthesia used: no Patient sedated: no Comments: IV access to L forearm using 22 gauge needle.   (including critical care time)  Patient here with recurrent migrainous type headache. She had a negative head CT scan 3 weeks ago for the same complaint. She has no focal neuro deficit on exam, does not appear toxic, no fever or nuchal rigidity to suggest meningitis. No signs of/stroke or subarachnoid hemorrhage. Migraine cocktail given.  IV access obtained by me.    7:25 PM Patient felt much better after receiving migraine cocktail. She is stable for discharge. She will follow-up with a neurologist or PCP for further  management. Return precaution discussed.    MDM   Final diagnoses:  Bad headache    BP 132/78 mmHg  Pulse 89  Temp(Src) 98.2 F (36.8 C) (Oral)  Resp 16  SpO2 96%  LMP 07/25/2015     Domenic Moras, PA-C 08/15/15 1925  Merrily Pew, MD 08/15/15 1761

## 2015-08-15 NOTE — ED Notes (Addendum)
Pt states she has had a migraine headache x 2 weeks, it felt better because she went to the hospital and then it came back 3 days ago. Got meds at her PCP today but still feels bad. Pt watching TV loudly in triage. Alert and oriented. Neuro intact.

## 2015-08-27 ENCOUNTER — Telehealth: Payer: Self-pay | Admitting: *Deleted

## 2015-08-27 MED ORDER — OXYCODONE-ACETAMINOPHEN 5-325 MG PO TABS: 1.0000 | ORAL_TABLET | Freq: Three times a day (TID) | ORAL | Status: DC | PRN

## 2015-08-27 NOTE — Telephone Encounter (Signed)
OK to refill

## 2015-08-27 NOTE — Telephone Encounter (Addendum)
Pt present to office requesting refill of Oxycodone.  Pt is informed that Dr. Jacqualyn Posey is in the [place] office and will be informed of her request and we will call her back.  Dr. Jacqualyn Posey ordered refill as previously.  Pt presents to pick up Percocet.

## 2015-09-04 ENCOUNTER — Encounter: Payer: Self-pay | Admitting: Neurology

## 2015-09-04 ENCOUNTER — Ambulatory Visit (INDEPENDENT_AMBULATORY_CARE_PROVIDER_SITE_OTHER): Payer: Medicaid Other | Admitting: Neurology

## 2015-09-04 VITALS — BP 112/80 | HR 84 | Ht 61.0 in | Wt 145.0 lb

## 2015-09-04 DIAGNOSIS — G43009 Migraine without aura, not intractable, without status migrainosus: Secondary | ICD-10-CM

## 2015-09-04 DIAGNOSIS — F17200 Nicotine dependence, unspecified, uncomplicated: Secondary | ICD-10-CM | POA: Insufficient documentation

## 2015-09-04 DIAGNOSIS — Z720 Tobacco use: Secondary | ICD-10-CM | POA: Diagnosis not present

## 2015-09-04 MED ORDER — NAPROXEN SODIUM 550 MG PO TABS: 550.0000 mg | ORAL_TABLET | Freq: Two times a day (BID) | ORAL | Status: DC

## 2015-09-04 MED ORDER — PROPRANOLOL HCL 40 MG PO TABS: 40.0000 mg | ORAL_TABLET | Freq: Two times a day (BID) | ORAL | Status: DC

## 2015-09-04 MED ORDER — SUMATRIPTAN SUCCINATE 100 MG PO TABS: ORAL_TABLET | ORAL | Status: DC

## 2015-09-04 NOTE — Progress Notes (Signed)
NEUROLOGY CONSULTATION NOTE  Jessica Case MRN: [id number] DOB: 17-Jun-1981  Referring provider: Dr. Deno Etienne (ED) Primary care provider: Dr. Adrian Blackwater  Reason for consult:  headaches  HISTORY OF PRESENT ILLNESS: Jessica Case is a 34 year old right-handed female with paranoid schizophrenia and tobacco abuse who presents for migraines.  History obtained by patient, her fiance, ED note and PCP note.  Images of head CT reviewed.  Onset:  2 months ago, although a couple of years ago she had different headache Location:  Back of head or left side Quality:  stabbing Intensity:  10/10 Aura:  no Prodrome:  no Associated symptoms:  Photophobia, phonophobia.  No nausea, visual disturbance or autonomic symptoms. Duration:  Several hours or until gets migraine cocktail Frequency:  5 to 6 days per month Triggers/exacerbating factors:  unknown Relieving factors:  Headache cocktail Activity:  Cannot function  Past abortive medication:  none Past preventative medication:  Propranolol (she took a year or two ago for different headaches and stopped because headaches had resolved) Other past therapy:  none  Current abortive medication:  BC powder, sumatriptan 50mg  (minimally effective) Antihypertensive medications:  none Antidepressant medications:  none Anticonvulsant medications:  none Vitamins/Herbal/Supplements:  none Other medication:  Geodon, trazodone  Caffeine:  no Alcohol:  1 beer every one to two days Smoker:  current Diet:  unhealthy Exercise:  Not routine Depression/stress:  stable Sleep hygiene:  poor Family history of headache:  Mother (migraines) Over the past couple of weeks, she reports toothache.  She has a dental appointment tomorrow.  PAST MEDICAL HISTORY: Past Medical History  Diagnosis Date  . Fallopian tube abscess   . Paranoid schizophrenia ([place])   . Claustrophobia     PAST SURGICAL HISTORY: Past Surgical History  Procedure Laterality Date  .  Tooth extraction      MEDICATIONS: Current Outpatient Prescriptions on File Prior to Visit  Medication Sig Dispense Refill  . Aspirin-Salicylamide-Caffeine (BC HEADACHE POWDER PO) Take 1 packet by mouth daily as needed (headache/migraine).    . fluticasone (FLONASE) 50 MCG/ACT nasal spray Place 2 sprays into both nostrils daily. 16 g 6  . loratadine (CLARITIN) 10 MG tablet Take 1 tablet (10 mg total) by mouth daily. (Patient taking differently: Take 10 mg by mouth daily as needed for allergies. ) 30 tablet 3  . traZODone (DESYREL) 150 MG tablet Take 150 mg by mouth at bedtime as needed for sleep.     . ziprasidone (GEODON) 40 MG capsule Take 40 mg by mouth 2 (two) times daily with a meal.    . promethazine (PHENERGAN) 12.5 MG tablet TAKE 1 TABLET BY MOUTH EVERY 6 HOURS AS NEEDED NAUSEA AND VOMITING  0   No current facility-administered medications on file prior to visit.    ALLERGIES: Allergies  Allergen Reactions  . Citrus Itching  . Strawberry Extract Hives  . Toradol [Ketorolac Tromethamine] Itching    FAMILY HISTORY: Family History  Problem Relation Age of Onset  . Hypertension Mother     SOCIAL HISTORY: Social History   Social History  . Marital Status: Single    Spouse Name: N/A  . Number of Children: 0  . Years of Education: N/A   Occupational History  . Not on file.   Social History Main Topics  . Smoking status: Current Every Day Smoker -- 0.10 packs/day    Types: Cigarettes  . Smokeless tobacco: Never Used  . Alcohol Use: 0.0 oz/week    0 Standard drinks or equivalent per  week     Comment: occ beer   . Drug Use: No  . Sexual Activity: Yes    Birth Control/ Protection: None   Other Topics Concern  . Not on file   Social History Narrative   8th grade education. Pt currently homeless, stays with mother, or fiancee    REVIEW OF SYSTEMS: Constitutional: No fevers, chills, or sweats, no generalized fatigue, change in appetite Eyes: No visual changes,  double vision, eye pain Ear, nose and throat: No hearing loss, ear pain, nasal congestion, sore throat Cardiovascular: No chest pain, palpitations Respiratory:  No shortness of breath at rest or with exertion, wheezes GastrointestinaI: No nausea, vomiting, diarrhea, abdominal pain, fecal incontinence Genitourinary:  No dysuria, urinary retention or frequency Musculoskeletal:  No neck pain, back pain Integumentary: No rash, pruritus, skin lesions Neurological: as above Psychiatric: insomnia Endocrine: No palpitations, fatigue, diaphoresis, mood swings, change in appetite, change in weight, increased thirst Hematologic/Lymphatic:  No anemia, purpura, petechiae. Allergic/Immunologic: no itchy/runny eyes, nasal congestion, recent allergic reactions, rashes  PHYSICAL EXAM: Filed Vitals:   09/04/15 1411  BP: 112/80  Pulse: 84   General: No acute distress.  Patient appears poorly-groomed.  Head:  Normocephalic/atraumatic Eyes:  fundi unremarkable, without vessel changes, exudates, hemorrhages or papilledema. Neck: supple, no paraspinal tenderness, full range of motion Back: No paraspinal tenderness Heart: regular rate and rhythm Lungs: Clear to auscultation bilaterally. Vascular: No carotid bruits. Neurological Exam: Mental status: alert and oriented to person, place, and time, recent and remote memory intact, fund of knowledge intact, attention and concentration intact, speech fluent and not dysarthric, language intact. Cranial nerves: CN I: not tested CN II: pupils equal, round and reactive to light, visual fields intact, fundi unremarkable, without vessel changes, exudates, hemorrhages or papilledema. CN III, IV, VI:  full range of motion, no nystagmus, no ptosis CN V: facial sensation intact CN VII: upper and lower face symmetric CN VIII: hearing intact CN IX, X: gag intact, uvula midline CN XI: sternocleidomastoid and trapezius muscles intact CN XII: tongue midline Bulk & Tone:  normal, no fasciculations. Motor:  5/5 throughout Sensation: temperature and vibration sensation intact. Deep Tendon Reflexes:  2+ throughout, toes downgoing.  Finger to nose testing:  Without dysmetria.  Heel to shin:  Without dysmetria.  Gait:  Normal station and stride.  Able to turn and tandem walk. Romberg negative.  IMPRESSION: Migraine without aura  PLAN: 1.  Initiate propranolol 40mg  twice daily 2.  For abortive therapy will try sumatriptan 100mg  with or without naproxen 550mg  3.  Smoking cessation 4.  See dentist as toothache may be contributing to headache 5.  She is to call in 4 weeks with update and follow up in 3 months.  Thank you for allowing me to take part in the care of this patient.  Metta Clines, DO  CC:  Boykin Nearing, MD

## 2015-09-04 NOTE — Patient Instructions (Addendum)
Migraine Recommendations: 1.  Start propranolol 40mg  twice daily.  Call in 4 weeks with update and we can adjust dose if needed. 2.  Take sumatriptan 100mg  at earliest onset of headache.  May repeat dose once in 2 hours if needed.  Do not exceed two tablets in 24 hours.  You may take first dose of sumatriptan with 550mg  of naproxen and can repeat naproxen alone in 12 hours if needed. 3.  Limit use of pain relievers to no more than 2 days out of the week.  These medications include acetaminophen, ibuprofen, triptans and narcotics.  This will help reduce risk of rebound headaches. 4.  Be aware of common food triggers such as processed sweets, processed foods with nitrites (such as deli meat, hot dogs, sausages), foods with MSG, alcohol (such as wine), chocolate, certain cheeses, certain fruits (dried fruits, some citrus fruit), vinegar, diet soda. 4.  Avoid caffeine 5.  Routine exercise 6.  Proper sleep hygiene 7.  Stay adequately hydrated with water 8.  Keep a headache diary. 9.  Maintain proper stress management. 10.  Do not skip meals. 11.  Consider supplements:  Magnesium oxide 400mg  to 600mg  daily, riboflavin 400mg , Coenzyme Q 10 100mg  three times daily 12.  Quit smoking 13.  Have tooth checked 14.  CALL IN 4 WEEKS WITH UPDATE.  Follow up in 3 months.

## 2015-09-09 ENCOUNTER — Encounter: Payer: Self-pay | Admitting: Podiatry

## 2015-09-09 ENCOUNTER — Ambulatory Visit (INDEPENDENT_AMBULATORY_CARE_PROVIDER_SITE_OTHER): Payer: Medicaid Other | Admitting: Podiatry

## 2015-09-09 ENCOUNTER — Ambulatory Visit (INDEPENDENT_AMBULATORY_CARE_PROVIDER_SITE_OTHER): Payer: Medicaid Other

## 2015-09-09 VITALS — BP 150/96 | HR 65 | Resp 18

## 2015-09-09 DIAGNOSIS — M21611 Bunion of right foot: Secondary | ICD-10-CM | POA: Insufficient documentation

## 2015-09-09 DIAGNOSIS — Z9889 Other specified postprocedural states: Secondary | ICD-10-CM

## 2015-09-09 DIAGNOSIS — M216X1 Other acquired deformities of right foot: Secondary | ICD-10-CM

## 2015-09-09 MED ORDER — OXYCODONE-ACETAMINOPHEN 5-325 MG PO TABS: 1.0000 | ORAL_TABLET | Freq: Four times a day (QID) | ORAL | Status: DC | PRN

## 2015-09-09 NOTE — Progress Notes (Signed)
Patient ID: Jessica Case, female   DOB: 07-10-1981, 34 y.o.   MRN: [id number]  Subjective: Jessica Case is a 34 y.o. is seen today in office s/p right [facility] bunionectomy preformed. She states the pain that she was having on the bottom has resolved and the pain is noted to the top. She has continue the surgical shoe as she is not tried going into a regular shoe. She does state of the top of the bunion site is sensitive. No pain with first MPJ range of motion. No redness over the area of the swelling has decreased. Denies any systemic complaints such as fevers, chills, nausea, vomiting. No calf pain, chest pain, shortness of breath.   Objective: General: No acute distress, AAOx3  DP/PT pulses palpable 2/4, CRT < 3 sec to all digits.  Protective sensation intact. Motor function intact.  Right foot: Incision is well coapted without any evidence of dehiscence and a scar has formed. There is no surrounding erythema, ascending cellulitis, fluctuance, crepitus, malodor, drainage/purulence. There is decreased edema around the surgical site. There is mild pain along the surgical site, mostly along the dorsal joint. There is no pain with first MTPJ range of motion. No other areas of tenderness to bilateral lower extremities.  No other open lesions or pre-ulcerative lesions.  No pain with calf compression, swelling, warmth, erythema.   Assessment and Plan:  Status post left foot bunionectomy   -X-rays were obtained and reviewed with the patient. There does appear to be increased consolidation. Is a dorsal exostosis present. There is rotation of the capital fragment. These x-rays signs were discussed with the patient. -Treatment options discussed including all alternatives, risks, and complications -Offloading pads were dispensed the patient to help pad the bony prominence which is likely causing her pain. -She can continue transition to regular shoe as tolerated. -Ice/elevation -Pain medication as  needed. -Assessment possible surgical intervention to remove the screw and shape the bone spur. She wishes to hold off for now.  -Monitor for any clinical signs or symptoms of infection and DVT/PE and directed to call the office immediately should any occur or go to the ER. -Follow-up in 6-8 weeksor sooner if any problems arise. In the meantime, encouraged to call the office with any questions, concerns, change in symptoms.   [name], DPM

## 2015-09-10 ENCOUNTER — Ambulatory Visit: Payer: Medicaid Other | Admitting: Family Medicine

## 2015-09-20 ENCOUNTER — Ambulatory Visit: Payer: Medicaid Other | Admitting: Family Medicine

## 2015-10-03 ENCOUNTER — Telehealth: Payer: Self-pay | Admitting: Family Medicine

## 2015-10-03 DIAGNOSIS — K029 Dental caries, unspecified: Secondary | ICD-10-CM

## 2015-10-03 NOTE — Telephone Encounter (Signed)
Dental referral placed.

## 2015-10-03 NOTE — Telephone Encounter (Signed)
Called pt. To let her know that dental referral was going to be placed in. Please f/u

## 2015-10-04 ENCOUNTER — Ambulatory Visit: Payer: Medicaid Other | Admitting: Family Medicine

## 2015-11-03 IMAGING — CT CT HEAD W/O CM
2 series · 16 of 30 positions shown, 20 images · non-contrast
Comparison: None

CLINICAL DATA: Headache for 1 week worse today, some blurry vision,
smoker

EXAM:
CT HEAD WITHOUT CONTRAST
TECHNIQUE: Contiguous axial images were obtained from the base of the skull
through the vertex without intravenous contrast.

[Series 201: head w/o, idose (1) · axial · non-contrast · 0.45mm/px · z∈[+79,+209]mm · 13 of 32 slices shown, 17 images]
[im 3/32  brain]
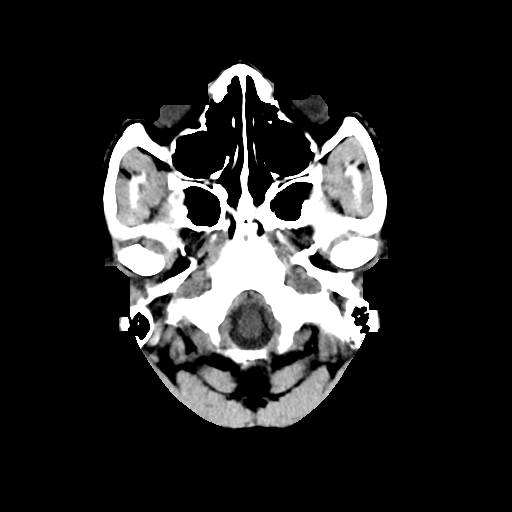
[im 3/32  bone]
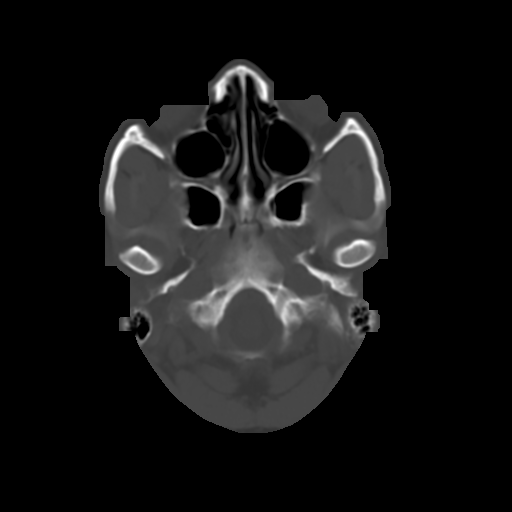
[im 5/32  brain]
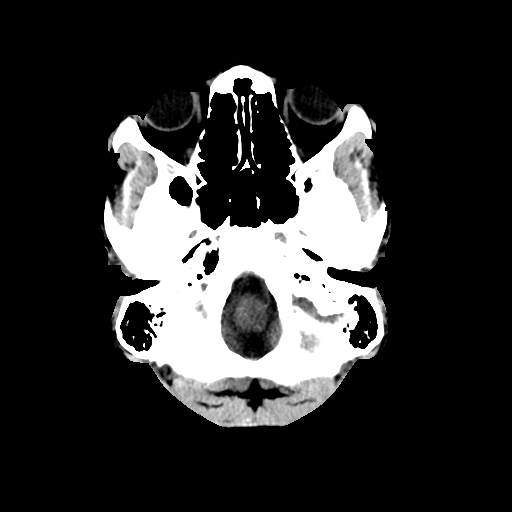
[im 7/32  brain]
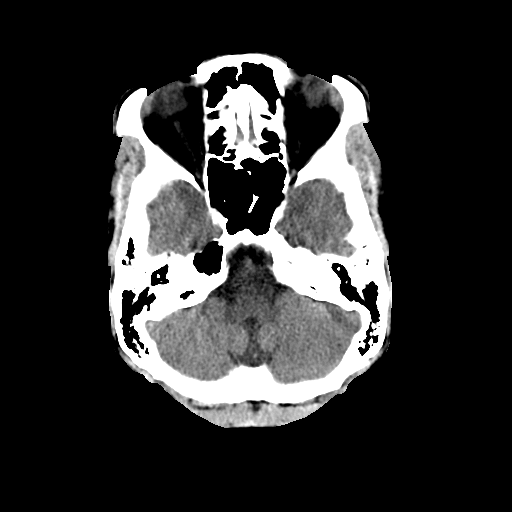
[im 9/32  brain]
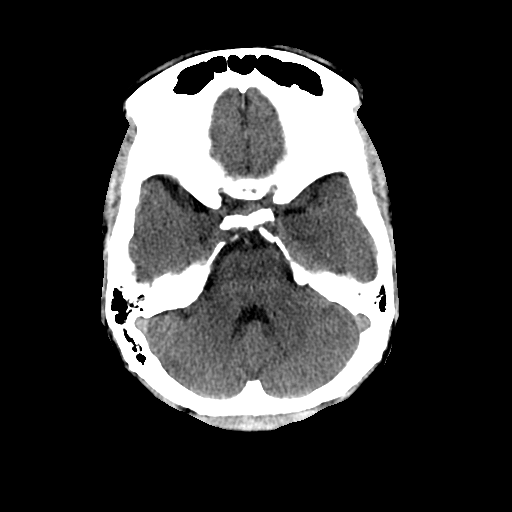
[im 12/32  brain]
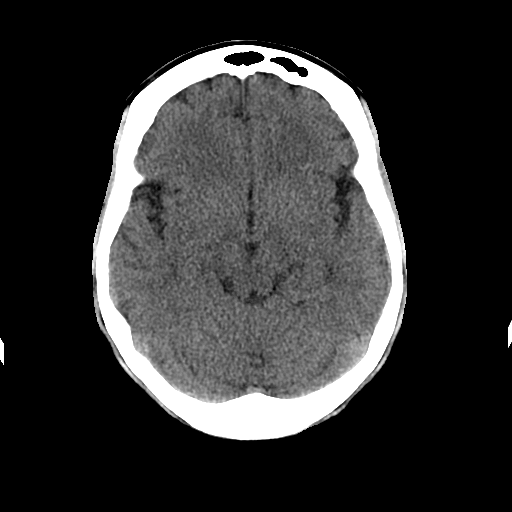
[im 12/32  bone]
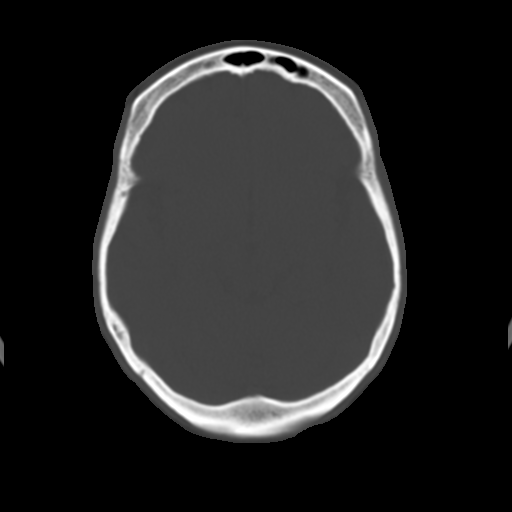
[im 14/32  brain]
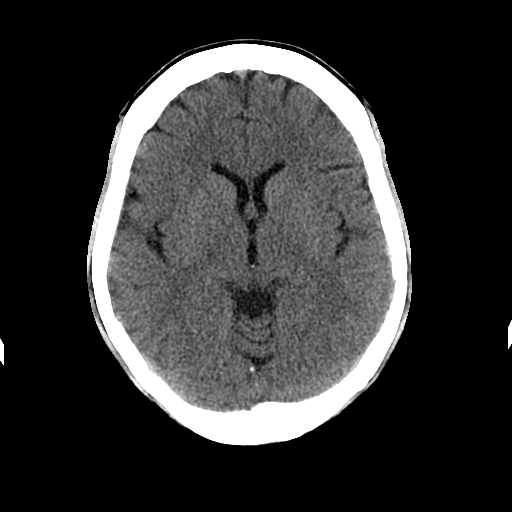
[im 16/32  brain]
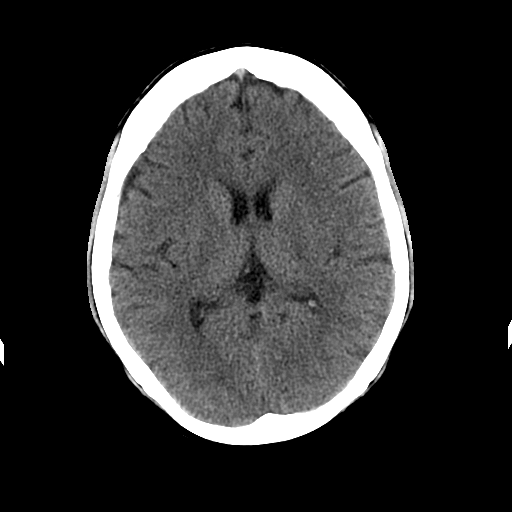
[im 18/32  brain]
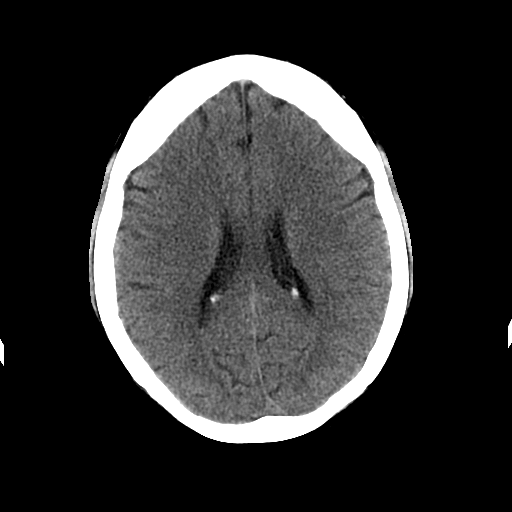
[im 20/32  brain]
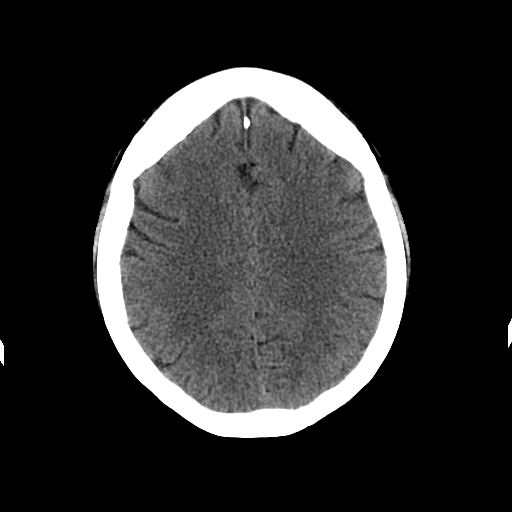
[im 20/32  bone]
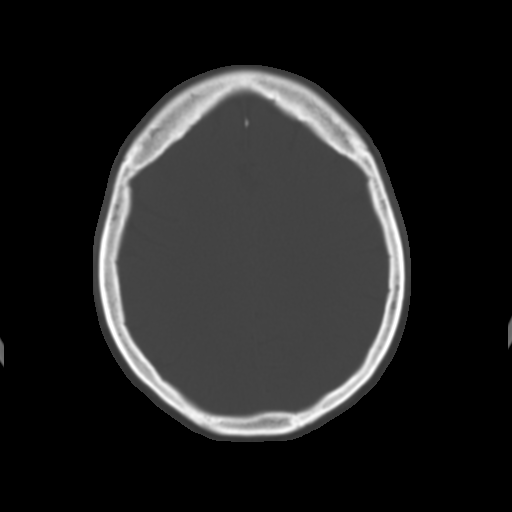
[im 23/32  brain]
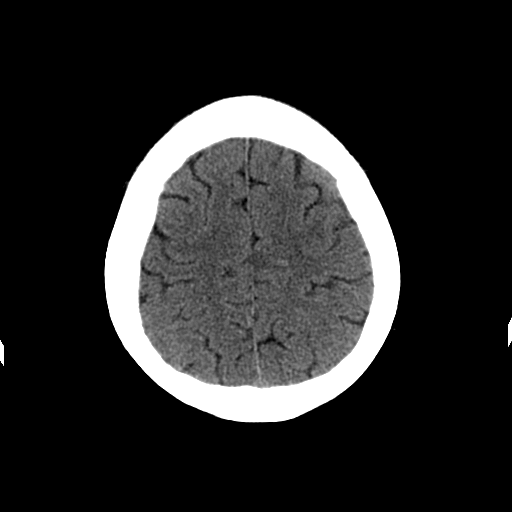
[im 25/32  brain]
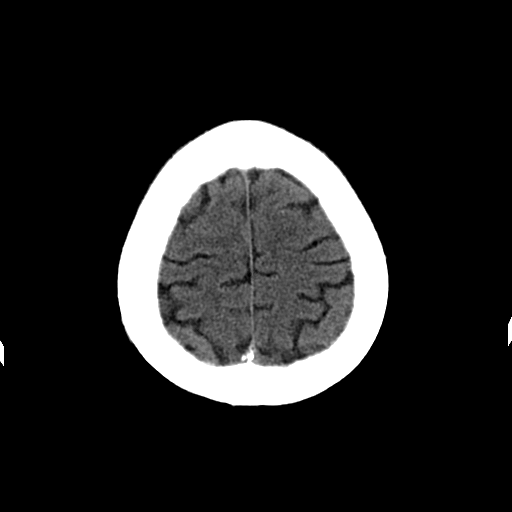
[im 27/32  brain]
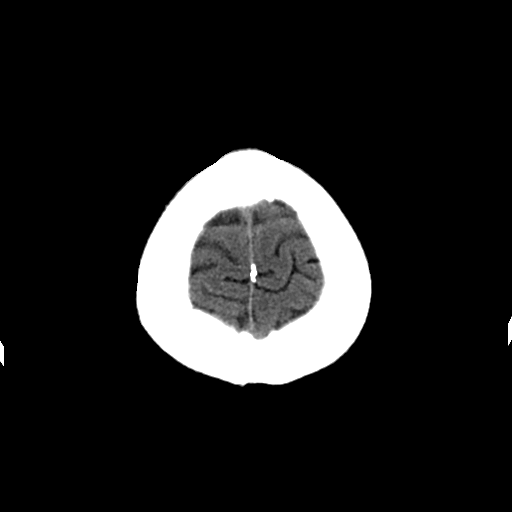
[im 29/32  brain]
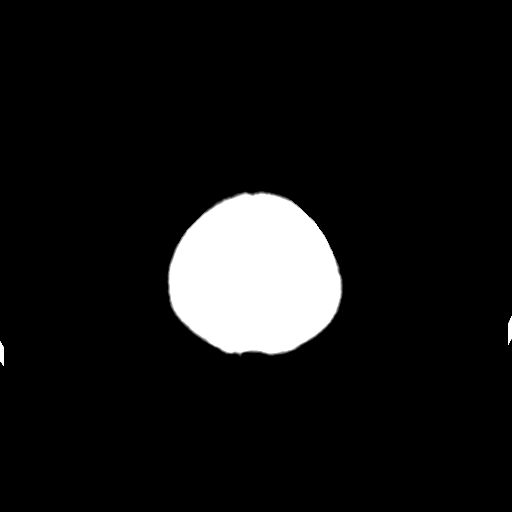
[im 29/32  bone]
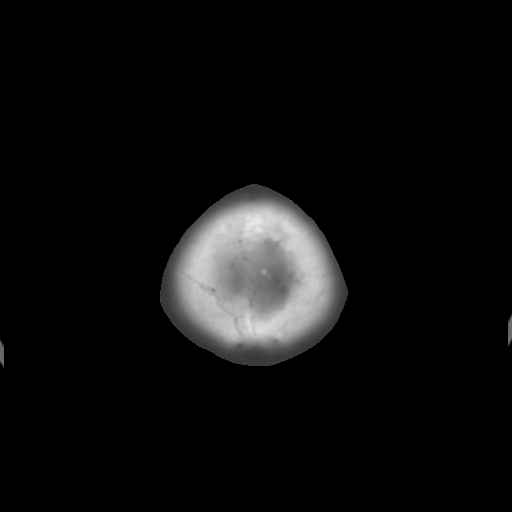

[Series 202: head w/o bone, idose (1) · axial · non-contrast · 0.45mm/px · z∈[+79,+124]mm · 3 of 32 slices shown]
[im 3/32  bone]
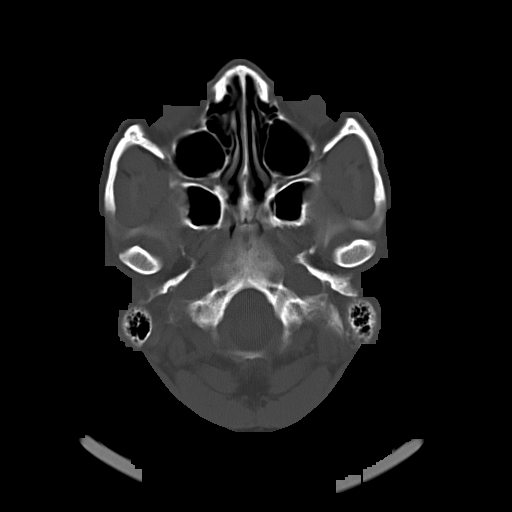
[im 7/32  bone]
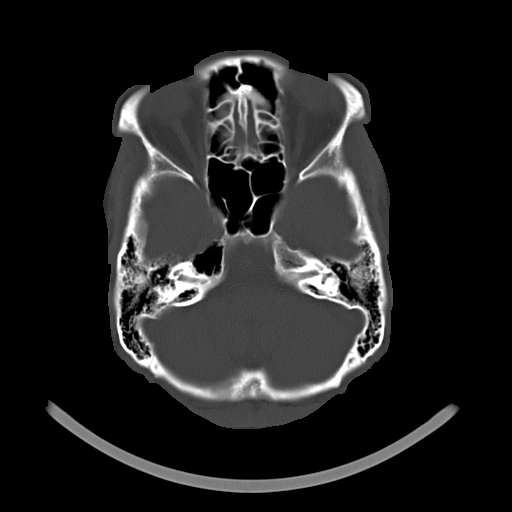
[im 12/32  bone]
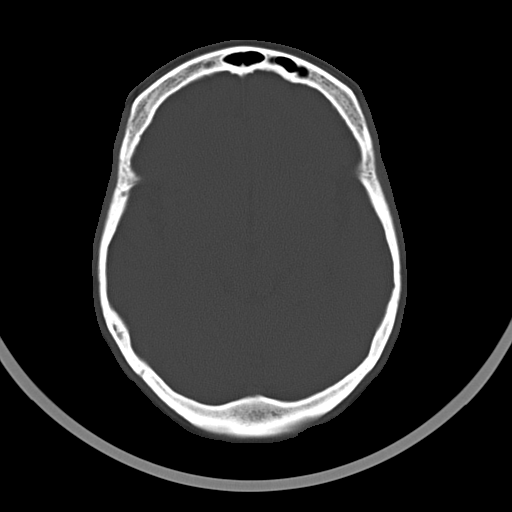

[16 of 30 positions shown; findings below may reference images not displayed]

FINDINGS: Normal ventricular morphology.

No midline shift or mass effect.

Normal appearance of brain parenchyma.

No intracranial hemorrhage, mass lesion, or acute infarction.

Visualized paranasal sinuses and mastoid air cells clear.

Bones unremarkable.
IMPRESSION: Normal exam.

## 2015-11-07 ENCOUNTER — Encounter (HOSPITAL_COMMUNITY): Payer: Self-pay | Admitting: Emergency Medicine

## 2015-11-07 ENCOUNTER — Emergency Department (HOSPITAL_COMMUNITY)
Admission: EM | Admit: 2015-11-07 | Discharge: 2015-11-07 | Disposition: A | Discharge status: Home / Self Care | Payer: Medicaid Other | Attending: Emergency Medicine | Admitting: Emergency Medicine

## 2015-11-07 DIAGNOSIS — K029 Dental caries, unspecified: Secondary | ICD-10-CM | POA: Diagnosis not present

## 2015-11-07 DIAGNOSIS — F200 Paranoid schizophrenia: Secondary | ICD-10-CM | POA: Diagnosis not present

## 2015-11-07 DIAGNOSIS — Z7951 Long term (current) use of inhaled steroids: Secondary | ICD-10-CM | POA: Insufficient documentation

## 2015-11-07 DIAGNOSIS — K0889 Other specified disorders of teeth and supporting structures: Secondary | ICD-10-CM | POA: Diagnosis not present

## 2015-11-07 DIAGNOSIS — Z79899 Other long term (current) drug therapy: Secondary | ICD-10-CM | POA: Diagnosis not present

## 2015-11-07 DIAGNOSIS — R509 Fever, unspecified: Secondary | ICD-10-CM | POA: Insufficient documentation

## 2015-11-07 DIAGNOSIS — Z791 Long term (current) use of non-steroidal anti-inflammatories (NSAID): Secondary | ICD-10-CM | POA: Insufficient documentation

## 2015-11-07 DIAGNOSIS — F17210 Nicotine dependence, cigarettes, uncomplicated: Secondary | ICD-10-CM | POA: Insufficient documentation

## 2015-11-07 MED ORDER — OXYCODONE-ACETAMINOPHEN 5-325 MG PO TABS: 1.0000 | ORAL_TABLET | ORAL | Status: DC | PRN

## 2015-11-07 MED ORDER — ONDANSETRON 4 MG PO TBDP
4.0000 mg | ORAL_TABLET | Freq: Once | ORAL | Status: AC
  Administered 2015-11-07: 4 mg via ORAL
  Filled 2015-11-07: qty 1

## 2015-11-07 MED ORDER — ONDANSETRON HCL 4 MG PO TABS: 4.0000 mg | ORAL_TABLET | Freq: Three times a day (TID) | ORAL | Status: DC | PRN

## 2015-11-07 MED ORDER — OXYCODONE-ACETAMINOPHEN 5-325 MG PO TABS
2.0000 | ORAL_TABLET | Freq: Once | ORAL | Status: AC
Start: 2015-11-07 — End: 2015-11-07
  Administered 2015-11-07: 2 via ORAL
  Filled 2015-11-07: qty 2

## 2015-11-07 MED ORDER — AMOXICILLIN 500 MG PO CAPS: 500.0000 mg | ORAL_CAPSULE | Freq: Three times a day (TID) | ORAL | Status: DC

## 2015-11-07 NOTE — ED Notes (Signed)
Declined W/C at D/C and was escorted to lobby by RN. 

## 2015-11-07 NOTE — Discharge Instructions (Signed)
[facility]  [address]  [place], [place] [address]  Phone [phone]  The [facility] in [place], [place], exemplifies the [employer] vision to improve the health and quality of life of all [place] by Regulatory affairs officer with a passion to care for the underserved and by leading the nation in community-based, service learning oral health education. We are committed to offering comprehensive general dental services for adults, children and special needs patients in a safe, caring and professional setting.  Appointments: Our clinic is open Monday through Friday 8:00 a.m. until 5:00 p.m. The amount of time scheduled for an appointment depends on the patients specific needs. We ask that you keep your appointed time for care or provide 24-hour notice of all appointment changes. Parents or legal guardians must accompany minor children.  Payment for Services: Medicaid and other insurance plans are welcome. Payment for services is due when services are rendered and may be made by cash or credit card. If you have dental insurance, we will assist you with your claim submission.   Emergencies: Emergency services will be provided Monday through Friday on a walk-in basis. Please arrive early for emergency services. After hours emergency services will be provided for patients of record as required.  Services:  [facility]  Oral Surgery - Extractions  Root Canals  Sealants and Tooth Colored Fillings  Crowns and Bridges  Dentures and Partial Dentures  Implant Services  Periodontal Services and Cleanings  Cosmetic Tooth Whitening  Digital Radiography  3-D/[facility] Beam Imaging  You have been seen by your caregiver because of dental pain.  SEEK MEDICAL ATTENTION IF: The exam  and treatment you received today has been provided on an emergency basis only. This is not a substitute for complete medical or dental care. If your problem worsens or new symptoms (problems) appear, and you are unable to arrange prompt follow-up care with your dentist, call or return to this location. CALL YOUR DENTIST OR RETURN IMMEDIATELY IF you develop a fever, rash, difficulty breathing or swallowing, neck or facial swelling, or other potentially serious concerns.

## 2015-11-07 NOTE — ED Provider Notes (Signed)
CSN: [id number]     Arrival date & time 11/07/15  1043 History  By signing my name below, I, Essence Howell, attest that this documentation has been prepared under the direction and in the presence of Margarita Mail, PA-C Electronically Signed: Ladene Artist, ED Scribe 11/07/2015 at 12:23 PM.   Chief Complaint  Patient presents with  . Dental Pain   The history is provided by the patient. No language interpreter was used.   HPI Comments: Jessica Case is a 35 y.o. female who presents to the Emergency Department complaining of constant, gradually worsening left upper dental pain for the past week, worsened 2 days ago. Pt states that she has a cavity that has worsened. She reports associated subjective fever. ED temperature 98.1 F. She has tried [facility] powder and 2 tylenol with codeine tablets with relief. Pt has not followed up with a dentist but is here for a referral.   Past Medical History  Diagnosis Date  . Fallopian tube abscess   . Paranoid schizophrenia ([place])   . Claustrophobia    Past Surgical History  Procedure Laterality Date  . Tooth extraction     Family History  Problem Relation Age of Onset  . Hypertension Mother    Social History  Substance Use Topics  . Smoking status: Current Every Day Smoker -- 0.10 packs/day    Types: Cigarettes  . Smokeless tobacco: Never Used  . Alcohol Use: 0.0 oz/week    0 Standard drinks or equivalent per week     Comment: occ beer    OB History    Gravida Para Term Preterm AB TAB SAB Ectopic Multiple Living   2 1 0 1 1 0 1   0     Review of Systems  Constitutional: Positive for fever (subjective).  HENT: Positive for dental problem.    Allergies  Citrus; Strawberry extract; and Toradol  Home Medications   Prior to Admission medications   Medication Sig Start Date End Date Taking? Authorizing Provider  Aspirin-Salicylamide-Caffeine (BC HEADACHE POWDER PO) Take 1 packet by mouth daily as needed (headache/migraine).   Yes  Historical Provider, MD  fluticasone (FLONASE) 50 MCG/ACT nasal spray Place 2 sprays into both nostrils daily. 07/18/15  Yes Josalyn Funches, MD  loratadine (CLARITIN) 10 MG tablet Take 1 tablet (10 mg total) by mouth daily. Patient taking differently: Take 10 mg by mouth daily as needed for allergies.  03/07/15  Yes Josalyn Funches, MD  traZODone (DESYREL) 150 MG tablet Take 150 mg by mouth at bedtime as needed for sleep.    Yes Historical Provider, MD  ziprasidone (GEODON) 40 MG capsule Take 40 mg by mouth 2 (two) times daily with a meal.   Yes Historical Provider, MD  naproxen sodium (ANAPROX) 550 MG tablet Take 1 tablet (550 mg total) by mouth 2 (two) times daily with a meal. 09/04/15   Pieter Partridge, DO  oxyCODONE-acetaminophen (ROXICET) 5-325 MG tablet Take 1-2 tablets by mouth every 6 (six) hours as needed for severe pain. 09/09/15   Trula Slade, DPM  promethazine (PHENERGAN) 12.5 MG tablet TAKE 1 TABLET BY MOUTH EVERY 6 HOURS AS NEEDED NAUSEA AND VOMITING 06/05/15   Historical Provider, MD  propranolol (INDERAL) 40 MG tablet Take 1 tablet (40 mg total) by mouth 2 (two) times daily. 09/04/15   Pieter Partridge, DO  SUMAtriptan (IMITREX) 100 MG tablet Take 1tablet at earliest onset of headache.  May repeat x1 in 2 hours if headache persists or recurs.  Do not exceed 2 tablets in 24 hours 09/04/15   Pieter Partridge, DO   BP 149/90 mmHg  Pulse 73  Temp(Src) 98.1 F (36.7 C) (Oral)  Resp 20  SpO2 100%  LMP 10/21/2015 Physical Exam  Constitutional: She is oriented to person, place, and time. She appears well-developed and well-nourished. No distress.  HENT:  Head: Normocephalic and atraumatic.  Mouth/Throat: Dental caries present.  Obvious dental caries. No redness or swelling. No external swelling or pain to palpation of the L jaw.  Eyes: Conjunctivae and EOM are normal.  Neck: Neck supple. No tracheal deviation present.  Cardiovascular: Normal rate.   Pulmonary/Chest: Effort normal. No  respiratory distress.  Musculoskeletal: Normal range of motion.  Neurological: She is alert and oriented to person, place, and time.  Skin: Skin is warm and dry.  Psychiatric: She has a normal mood and affect. Her behavior is normal.  Nursing note and vitals reviewed.  ED Course  Procedures (including critical care time) DIAGNOSTIC STUDIES: Oxygen Saturation is 100% on RA, normal by my interpretation.    COORDINATION OF CARE: 12:12 PM-Discussed treatment plan which includes Percocet, Zofran, Amoxil and f/u with dentist with pt at bedside and pt agreed to plan.   Labs Review Labs Reviewed - No data to display  Imaging Review No results found.   EKG Interpretation None      MDM   Final diagnoses:  Pain, dental   Patient with toothache. No gross abscess. Exam unconcerning for Ludwig's angina or spread of infection. Will treat with penicillin and pain medicine. Urged patient to follow-up with dentist.    I personally performed the services described in this documentation, which was scribed in my presence. The recorded information has been reviewed and is accurate.       Margarita Mail, PA-C 11/07/15 [place], PA-C 11/07/15 1230  Gareth Morgan, MD 11/08/15 [phone]

## 2015-11-07 NOTE — ED Notes (Signed)
Patient states L upper tooth pain x 1 week.   Patient states cavity that has worsened.  Patient states has not been to a dentist.   Patient states needs a referral to dentistry.

## 2015-11-11 ENCOUNTER — Ambulatory Visit: Payer: Medicaid Other | Admitting: Podiatry

## 2015-11-15 ENCOUNTER — Ambulatory Visit: Payer: Medicaid Other | Admitting: Podiatry

## 2015-11-18 ENCOUNTER — Ambulatory Visit: Payer: Medicaid Other | Admitting: Podiatry

## 2015-11-18 ENCOUNTER — Emergency Department (HOSPITAL_COMMUNITY)
Admission: EM | Admit: 2015-11-18 | Discharge: 2015-11-18 | Disposition: A | Discharge status: Home / Self Care | Payer: Medicaid Other | Attending: Emergency Medicine | Admitting: Emergency Medicine

## 2015-11-18 DIAGNOSIS — F17210 Nicotine dependence, cigarettes, uncomplicated: Secondary | ICD-10-CM | POA: Diagnosis not present

## 2015-11-18 DIAGNOSIS — Z7982 Long term (current) use of aspirin: Secondary | ICD-10-CM | POA: Diagnosis not present

## 2015-11-18 DIAGNOSIS — F200 Paranoid schizophrenia: Secondary | ICD-10-CM

## 2015-11-18 DIAGNOSIS — Z792 Long term (current) use of antibiotics: Secondary | ICD-10-CM | POA: Diagnosis not present

## 2015-11-18 DIAGNOSIS — Z79899 Other long term (current) drug therapy: Secondary | ICD-10-CM | POA: Insufficient documentation

## 2015-11-18 DIAGNOSIS — F10229 Alcohol dependence with intoxication, unspecified: Secondary | ICD-10-CM | POA: Diagnosis not present

## 2015-11-18 DIAGNOSIS — Z7951 Long term (current) use of inhaled steroids: Secondary | ICD-10-CM | POA: Diagnosis not present

## 2015-11-18 DIAGNOSIS — F10929 Alcohol use, unspecified with intoxication, unspecified: Secondary | ICD-10-CM

## 2015-11-18 DIAGNOSIS — R451 Restlessness and agitation: Secondary | ICD-10-CM | POA: Diagnosis present

## 2015-11-18 LAB — CBC WITH DIFFERENTIAL/PLATELET
BASOS ABS: 0 10*3/uL (ref 0.0–0.1)
Basophils Relative: 0 %
Eosinophils Absolute: 0.1 10*3/uL (ref 0.0–0.7)
Eosinophils Relative: 2 %
HCT: 34.9 % — ABNORMAL LOW (ref 36.0–46.0)
HEMOGLOBIN: 11.8 g/dL — AB (ref 12.0–15.0)
LYMPHS ABS: 2.9 10*3/uL (ref 0.7–4.0)
LYMPHS PCT: 45 %
MCH: 30.4 pg (ref 26.0–34.0)
MCHC: 33.8 g/dL (ref 30.0–36.0)
MCV: 89.9 fL (ref 78.0–100.0)
Monocytes Absolute: 0.8 10*3/uL (ref 0.1–1.0)
Monocytes Relative: 13 %
NEUTROS PCT: 40 %
Neutro Abs: 2.5 10*3/uL (ref 1.7–7.7)
PLATELETS: 248 10*3/uL (ref 150–400)
RBC: 3.88 MIL/uL (ref 3.87–5.11)
RDW: 14.4 % (ref 11.5–15.5)
WBC: 6.4 10*3/uL (ref 4.0–10.5)

## 2015-11-18 LAB — BASIC METABOLIC PANEL
ANION GAP: 11 (ref 5–15)
BUN: <5 mg/dL — ABNORMAL LOW (ref 6–20)
CALCIUM: 8.3 mg/dL — AB (ref 8.9–10.3)
CO2: 21 mmol/L — AB (ref 22–32)
CREATININE: 0.81 mg/dL (ref 0.44–1.00)
Chloride: 107 mmol/L (ref 101–111)
GFR calc Af Amer: >60 mL/min (ref >60)
GFR calc non Af Amer: >60 mL/min (ref >60)
Glucose, Bld: 76 mg/dL (ref 65–99)
Potassium: 3.7 mmol/L (ref 3.5–5.1)
SODIUM: 139 mmol/L (ref 135–145)

## 2015-11-18 LAB — ETHANOL: Alcohol, Ethyl (B): 250 mg/dL — ABNORMAL HIGH (ref <5)

## 2015-11-18 MED ORDER — STERILE WATER FOR INJECTION IJ SOLN
INTRAMUSCULAR | Status: AC
  Filled 2015-11-18: qty 10

## 2015-11-18 MED ORDER — DIPHENHYDRAMINE HCL 50 MG/ML IJ SOLN
50.0000 mg | Freq: Once | INTRAMUSCULAR | Status: AC
  Administered 2015-11-18: 50 mg via INTRAVENOUS
  Filled 2015-11-18: qty 1

## 2015-11-18 MED ORDER — ZIPRASIDONE MESYLATE 20 MG IM SOLR
20.0000 mg | Freq: Once | INTRAMUSCULAR | Status: AC
  Administered 2015-11-18: 20 mg via INTRAMUSCULAR
  Filled 2015-11-18: qty 20

## 2015-11-18 NOTE — ED Provider Notes (Signed)
Patient visit shared. Patient was brought in overnight for alcohol intoxication and agitation. The patient awoke this morning and she is calm and appropriate. She states that she drank too much alcohol. She denies any SI, HI. She is not hallucinating or responding to internal stimuli in the emergency department. Discussed outpatient follow-up with monitor, return precautions. Discharged to the care of her roommate and friend.  Quintella Reichert, MD 11/18/15 [phone]

## 2015-11-18 NOTE — ED Notes (Signed)
Patient changed into purple scrubs without difficulty. Belongings placed in bags and labeled.

## 2015-11-18 NOTE — ED Notes (Signed)
Per charge RN: patient was dropped off at this ED by family member who reported that patient has not been taking her medications. Upon arrival patient approached a window and spoke to it for a period of time. Patient then left waiting area and ran through the hospital.

## 2015-11-18 NOTE — Discharge Instructions (Signed)
Alcohol Intoxication Alcohol intoxication occurs when the amount of alcohol that a person has consumed impairs his or her ability to mentally and physically function. Alcohol directly impairs the normal chemical activity of the brain. Drinking large amounts of alcohol can lead to changes in mental function and behavior, and it can cause many physical effects that can be harmful.  Alcohol intoxication can range in severity from mild to very severe. Various factors can affect the level of intoxication that occurs, such as the person's age, gender, weight, frequency of alcohol consumption, and the presence of other medical conditions (such as diabetes, seizures, or heart conditions). Dangerous levels of alcohol intoxication may occur when people drink large amounts of alcohol in a short period (binge drinking). Alcohol can also be especially dangerous when combined with certain prescription medicines or "recreational" drugs. SIGNS AND SYMPTOMS Some common signs and symptoms of mild alcohol intoxication include:  Loss of coordination.  Changes in mood and behavior.  Impaired judgment.  Slurred speech. As alcohol intoxication progresses to more severe levels, other signs and symptoms will appear. These may include:  Vomiting.  Confusion and impaired memory.  Slowed breathing.  Seizures.  Loss of consciousness. DIAGNOSIS  Your health care provider will take a medical history and perform a physical exam. You will be asked about the amount and type of alcohol you have consumed. Blood tests will be done to measure the concentration of alcohol in your blood. In many places, your blood alcohol level must be lower than 80 mg/dL (0.08%) to legally drive. However, many dangerous effects of alcohol can occur at much lower levels.  TREATMENT  People with alcohol intoxication often do not require treatment. Most of the effects of alcohol intoxication are temporary, and they go away as the alcohol naturally  leaves the body. Your health care provider will monitor your condition until you are stable enough to go home. Fluids are sometimes given through an IV access tube to help prevent dehydration.  HOME CARE INSTRUCTIONS  Do not drive after drinking alcohol.  Stay hydrated. Drink enough water and fluids to keep your urine clear or pale yellow. Avoid caffeine.   Only take over-the-counter or prescription medicines as directed by your health care provider.  SEEK MEDICAL CARE IF:   You have persistent vomiting.   You do not feel better after a few days.  You have frequent alcohol intoxication. Your health care provider can help determine if you should see a substance use treatment counselor. SEEK IMMEDIATE MEDICAL CARE IF:   You become shaky or tremble when you try to stop drinking.   You shake uncontrollably (seizure).   You throw up (vomit) blood. This may be bright red or may look like black coffee grounds.   You have blood in your stool. This may be bright red or may appear as a black, tarry, bad smelling stool.   You become lightheaded or faint.  MAKE SURE YOU:   Understand these instructions.  Will watch your condition.  Will get help right away if you are not doing well or get worse.   This information is not intended to replace advice given to you by your health care provider. Make sure you discuss any questions you have with your health care provider.   Document Released: 07/15/2005 Document Revised: 06/07/2013 Document Reviewed: 03/10/2013 Elsevier Interactive Patient Education 2016 [employer].  Schizophrenia Schizophrenia is a mental illness. It may cause disturbed or disorganized thinking, speech, or behavior. People with schizophrenia have problems  functioning in one or more areas of life: work, school, home, or relationships. People with schizophrenia are at increased risk for suicide, certain chronic physical illnesses, and unhealthy behaviors, such as  smoking and drug use. People who have family members with schizophrenia are at higher risk of developing the illness. Schizophrenia affects men and women equally but usually appears at an earlier age (teenage or early adult years) in men.  SYMPTOMS The earliest symptoms are often subtle (prodrome) and may go unnoticed until the illness becomes more severe (first-break psychosis). Symptoms of schizophrenia may be continuous or may come and go in severity. Episodes often are triggered by major life events, such as family stress, college, [employer], marriage, pregnancy or child birth, divorce, or loss of a loved one. People with schizophrenia may see, hear, or feel things that do not exist (hallucinations). They may have false beliefs in spite of obvious proof to the contrary (delusions). Sometimes speech is incoherent or behavior is odd or withdrawn.  DIAGNOSIS Schizophrenia is diagnosed through an assessment by your caregiver. Your caregiver will ask questions about your thoughts, behavior, mood, and ability to function in daily life. Your caregiver may ask questions about your medical history and use of alcohol or drugs, including prescription medication. Your caregiver may also order blood tests and imaging exams. Certain medical conditions and substances can cause symptoms that resemble schizophrenia. Your caregiver may refer you to a mental health specialist for evaluation. There are three major criterion for a diagnosis of schizophrenia:  Two or more of the following five symptoms are present for a month or longer:  Delusions. Often the delusions are that you are being attacked, harassed, cheated, persecuted or conspired against (persecutory delusions).  Hallucinations.   Disorganized speech that does not make sense to others.  Grossly disorganized (confused or unfocused) behavior or extremely overactive or underactive motor activity (catatonia).  Negative symptoms such as bland or  blunted emotions (flat affect), loss of will power (avolition), and withdrawal from social contacts (social isolation).  Level of functioning in one or more major areas of life (work, school, relationships, or self-care) is markedly below the level of functioning before the onset of illness.   There are continuous signs of illness (either mild symptoms or decreased level of functioning) for at least 6 months or longer. TREATMENT  Schizophrenia is a long-term illness. It is best controlled with continuous treatment rather than treatment only when symptoms occur. The following treatments are used to manage schizophrenia:  Medication--Medication is the most effective and important form of treatment for schizophrenia. Antipsychotic medications are usually prescribed to help manage schizophrenia. Other types of medication may be added to relieve any symptoms that may occur despite the use of antipsychotic medications.  Counseling or talk therapy--Individual, group, or family counseling may be helpful in providing education, support, and guidance. Many people with schizophrenia also benefit from social skills and job skills (vocational) training. A combination of medication and counseling is best for managing the disorder over time. A procedure in which electricity is applied to the brain through the scalp (electroconvulsive therapy) may be used to treat catatonic schizophrenia or schizophrenia in people who cannot take or do not respond to medication and counseling.   This information is not intended to replace advice given to you by your health care provider. Make sure you discuss any questions you have with your health care provider.   Document Released: 10/02/2000 Document Revised: 10/26/2014 Document Reviewed: 12/28/2012 Elsevier Interactive Patient Education 2016 Elsevier  Inc. ° °
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
## 2015-11-18 NOTE — ED Notes (Signed)
Called staffing for sitter. At this time no sitters are available.

## 2015-11-18 NOTE — ED Provider Notes (Signed)
CSN: [id number]     Arrival date & time 11/18/15  0441 History   First MD Initiated Contact with Patient 11/18/15 [phone]     Chief Complaint  Patient presents with  . Agitation     (Consider location/radiation/quality/duration/timing/severity/associated sxs/prior Treatment) HPI  This is a 35 year old female with history of paranoid schizophrenia who presents with agitation. Patient was found by police officers running from "[place]."  It is unclear why the patient was in the hospital. She reports that she's mad it done and doesn't want to stay here. She states she'll go home. She reports recently having a tooth pulled and taking antibiotic, daily Geodon, Norco, and alcohol earlier today. She states "I think it may be crazy." She reports an unknown amount of beer.  She is otherwise too agitated to obtain further history from.  Level V caveat.  Past Medical History  Diagnosis Date  . Fallopian tube abscess   . Paranoid schizophrenia ([place])   . Claustrophobia    Past Surgical History  Procedure Laterality Date  . Tooth extraction     Family History  Problem Relation Age of Onset  . Hypertension Mother    Social History  Substance Use Topics  . Smoking status: Current Every Day Smoker -- 0.10 packs/day    Types: Cigarettes  . Smokeless tobacco: Never Used  . Alcohol Use: 0.0 oz/week    0 Standard drinks or equivalent per week     Comment: occ beer    OB History    Gravida Para Term Preterm AB TAB SAB Ectopic Multiple Living   2 1 0 1 1 0 1   0     Review of Systems  Unable to perform ROS: Psychiatric disorder      Allergies  Citrus; Strawberry extract; and Toradol  Home Medications   Prior to Admission medications   Medication Sig Start Date End Date Taking? Authorizing Provider  amoxicillin (AMOXIL) 500 MG capsule Take 1 capsule (500 mg total) by mouth 3 (three) times daily. 11/07/15   Margarita Mail, PA-C  Aspirin-Salicylamide-Caffeine (BC HEADACHE POWDER PO) Take 1  packet by mouth daily as needed (headache/migraine).    Historical Provider, MD  fluticasone (FLONASE) 50 MCG/ACT nasal spray Place 2 sprays into both nostrils daily. 07/18/15   Josalyn Funches, MD  loratadine (CLARITIN) 10 MG tablet Take 1 tablet (10 mg total) by mouth daily. Patient taking differently: Take 10 mg by mouth daily as needed for allergies.  03/07/15   Boykin Nearing, MD  naproxen sodium (ANAPROX) 550 MG tablet Take 1 tablet (550 mg total) by mouth 2 (two) times daily with a meal. 09/04/15   Pieter Partridge, DO  ondansetron (ZOFRAN) 4 MG tablet Take 1 tablet (4 mg total) by mouth every 8 (eight) hours as needed for nausea or vomiting. 11/07/15   Margarita Mail, PA-C  oxyCODONE-acetaminophen (PERCOCET) 5-325 MG tablet Take 1-2 tablets by mouth every 4 (four) hours as needed. 11/07/15   Margarita Mail, PA-C  promethazine (PHENERGAN) 12.5 MG tablet TAKE 1 TABLET BY MOUTH EVERY 6 HOURS AS NEEDED NAUSEA AND VOMITING 06/05/15   Historical Provider, MD  propranolol (INDERAL) 40 MG tablet Take 1 tablet (40 mg total) by mouth 2 (two) times daily. 09/04/15   Pieter Partridge, DO  SUMAtriptan (IMITREX) 100 MG tablet Take 1tablet at earliest onset of headache.  May repeat x1 in 2 hours if headache persists or recurs.  Do not exceed 2 tablets in 24 hours 09/04/15   Stephan Minister  Jaffe, DO  traZODone (DESYREL) 150 MG tablet Take 150 mg by mouth at bedtime as needed for sleep.     Historical Provider, MD  ziprasidone (GEODON) 40 MG capsule Take 40 mg by mouth 2 (two) times daily with a meal.    Historical Provider, MD   BP 158/107 mmHg  Pulse 113  Temp(Src)   Resp 20  SpO2 99%  LMP 10/21/2015 Physical Exam  Constitutional: She is oriented to person, place, and time. No distress.  Agitated, pacing the room  HENT:  Head: Normocephalic and atraumatic.  Cardiovascular: Normal rate and regular rhythm.   Pulmonary/Chest: Effort normal. No respiratory distress.  Neurological: She is alert and oriented to person,  place, and time.  Skin: Skin is warm and dry.  Psychiatric: She has a normal mood and affect.  Nursing note and vitals reviewed.   ED Course  Procedures (including critical care time) Labs Review Labs Reviewed  CBC WITH DIFFERENTIAL/PLATELET  BASIC METABOLIC PANEL  ETHANOL  PREGNANCY, URINE  URINE RAPID DRUG SCREEN, HOSP PERFORMED    Imaging Review No results found. I have personally reviewed and evaluated these images and lab results as part of my medical decision-making.   EKG Interpretation None      MDM   Final diagnoses:  Paranoid schizophrenia [facility])    Patient presents with acute agitation. We were able to get her into a room and she allowed [place] to give her Geodon and Benadryl which made her much more cooperative. Does, her friend has arrived. He states that she's been very erratic of the last several days. She has been drinking and not taking her medications. Medical clearance labwork obtained in TTS will be consulted.    Merryl Hacker, MD 11/18/15 [phone]
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
## 2015-11-20 ENCOUNTER — Telehealth: Payer: Self-pay | Admitting: Podiatry

## 2015-11-20 NOTE — Telephone Encounter (Signed)
This patient called at  9 pm on 31 January. She says her surgical foot is swollen in both her foot and leg.  She says she has surgery and wore cam walker for 2 weeks.  Then she wore surgical shoe.  She was told by Dr. Jacqualyn Posey to pick up sneakers.  She says she was having difficulty getting shoes due to her foot and leg swelling.   She is now concerned about the swelling.  She says she has no pain in her foot and leg.  I asked her to press on the back of her calf to check for pain and she said there was no pain.  She denied redness and pain only swelling.   I told her to return to cam walker temporarily to see if the swelling resolves.  If it does not resolve she will need to call the office for follow up appointment.  According to the notes this patient missed her last appointment.  Gardiner Barefoot DPM

## 2015-12-03 ENCOUNTER — Ambulatory Visit: Payer: Medicaid Other | Admitting: Podiatry

## 2015-12-09 ENCOUNTER — Ambulatory Visit (INDEPENDENT_AMBULATORY_CARE_PROVIDER_SITE_OTHER): Payer: Medicaid Other

## 2015-12-09 ENCOUNTER — Ambulatory Visit: Payer: Medicaid Other

## 2015-12-09 ENCOUNTER — Ambulatory Visit (INDEPENDENT_AMBULATORY_CARE_PROVIDER_SITE_OTHER): Payer: Medicaid Other | Admitting: Podiatry

## 2015-12-09 ENCOUNTER — Encounter: Payer: Self-pay | Admitting: Podiatry

## 2015-12-09 VITALS — BP 133/83 | HR 80 | Resp 18

## 2015-12-09 DIAGNOSIS — M216X2 Other acquired deformities of left foot: Secondary | ICD-10-CM

## 2015-12-09 DIAGNOSIS — M21612 Bunion of left foot: Secondary | ICD-10-CM

## 2015-12-09 DIAGNOSIS — M216X1 Other acquired deformities of right foot: Secondary | ICD-10-CM | POA: Diagnosis not present

## 2015-12-09 DIAGNOSIS — R52 Pain, unspecified: Secondary | ICD-10-CM

## 2015-12-09 DIAGNOSIS — M21611 Bunion of right foot: Secondary | ICD-10-CM

## 2015-12-09 NOTE — Patient Instructions (Signed)

## 2015-12-09 NOTE — Progress Notes (Signed)
Patient ID: Jessica Case, female   DOB: 13-Dec-1980, 35 y.o.   MRN: [id number]  Subjective: 35 year old female presents the office today. Vibration of right foot bunion surgery. She said that she is doing well she's having no pain and she is very happy with the results of the bunion on the right side. At this time should proceed with surgery on the left side as she is having pain upon the bunion site and she has attempted multiple conservative treatments including shoe gear modifications, padding, offloading. Denies any systemic complaints such as fevers, chills, nausea, vomiting. No acute changes since last appointment, and no other complaints at this time.   Objective: AAO x3, NAD DP/PT pulses palpable bilaterally, CRT less than 3 seconds Protective sensation intact with Simms Weinstein monofilament Incision the dorsal medial aspect of the right foot as well coapted and scars formed. There is no overlying edema, erythema, increase in warmth. There is no pain or restriction a first MTPJ range of motion. No tenderness along the surgical site. On the left foot there is moderate HAV deformity present irritation on the first metatarsal head medially at the bunion site. There is no pain or crepitation with first MTPJ range of motion. No hypermobility is present. No areas of pinpoint bony tenderness or pain with vibratory sensation. MMT 5/5, ROM WNL. No edema, erythema, increase in warmth to bilateral lower extremities.  No open lesions or pre-ulcerative lesions.  No pain with calf compression, swelling, warmth, erythema  Assessment: Left foot symptomatic HAV, healed bunionectomy right foot  Plan: -All treatment options discussed with the patient including all alternatives, risks, complications.  -X-rays were taken of the right foot. Also appears to be healed although there does appear to be angulation of A fragment of the first metatarsal head. She has good range of motion at this time and she is  having no pain. Discussed with her in the future she is likely to lead to arthritis and may need to have surgery done the future to correct that. She understands this but she is doing well this time. Continue with regular shoes on the right side. -For the left foot she would like to schedule bunion correction for the painful bunion. I discussed with her both treatment options including proximal procedure versus distal. She cannot have the recovery of a more aggressive surgery scheduled to proceed with the same surgery on the left side including an Austin bunionectomy. She understands that this may not give her complete correction however will hopefully help illuminate some of her pain. -The incision placement as well as the postoperative course was discussed with the patient. I discussed risks of the surgery which include, but not limited to, infection, bleeding, pain, swelling, need for further surgery, delayed or nonhealing, painful or ugly scar, numbness or sensation changes, over/under correction, recurrence, transfer lesions, further deformity, hardware failure, DVT/PE, loss of toe/foot. Patient understands these risks and wishes to proceed with surgery. The surgical consent was reviewed with the patient all 3 pages were signed. No promises or guarantees were given to the outcome of the procedure. All questions were answered to the best of my ability. Before the surgery the patient was encouraged to call the office if there is any further questions. The surgery will be performed at the [facility] on an outpatient basis. -Patient encouraged to call the office with any questions, concerns, change in symptoms.   [name], DPM

## 2015-12-16 ENCOUNTER — Emergency Department (HOSPITAL_COMMUNITY)
Admission: EM | Admit: 2015-12-16 | Discharge: 2015-12-16 | Disposition: A | Discharge status: Home / Self Care | Payer: Medicaid Other | Attending: Emergency Medicine | Admitting: Emergency Medicine

## 2015-12-16 ENCOUNTER — Encounter (HOSPITAL_COMMUNITY): Payer: Self-pay | Admitting: *Deleted

## 2015-12-16 DIAGNOSIS — M272 Inflammatory conditions of jaws: Secondary | ICD-10-CM | POA: Diagnosis not present

## 2015-12-16 DIAGNOSIS — F17210 Nicotine dependence, cigarettes, uncomplicated: Secondary | ICD-10-CM | POA: Insufficient documentation

## 2015-12-16 NOTE — ED Notes (Signed)
Pt walking out of triage stating "im leaving, im not waiting here"

## 2015-12-16 NOTE — ED Notes (Signed)
Pt c/o abscess to jaw area. Pt then offered the information that she has been drinking today, when asked her how much pt became defensive.

## 2015-12-16 NOTE — ED Notes (Signed)
While attempting to triage pt she continued to say "no" pt states "everything is a no I just want someone to look at this and tell me what it is"

## 2015-12-17 ENCOUNTER — Encounter (HOSPITAL_COMMUNITY): Payer: Self-pay

## 2015-12-17 ENCOUNTER — Emergency Department (HOSPITAL_COMMUNITY)
Admission: EM | Admit: 2015-12-17 | Discharge: 2015-12-17 | Disposition: A | Discharge status: Home / Self Care | Payer: Medicaid Other | Attending: Emergency Medicine | Admitting: Emergency Medicine

## 2015-12-17 DIAGNOSIS — F17210 Nicotine dependence, cigarettes, uncomplicated: Secondary | ICD-10-CM | POA: Diagnosis not present

## 2015-12-17 DIAGNOSIS — Z8742 Personal history of other diseases of the female genital tract: Secondary | ICD-10-CM | POA: Diagnosis not present

## 2015-12-17 DIAGNOSIS — Z8659 Personal history of other mental and behavioral disorders: Secondary | ICD-10-CM | POA: Insufficient documentation

## 2015-12-17 DIAGNOSIS — J111 Influenza due to unidentified influenza virus with other respiratory manifestations: Secondary | ICD-10-CM | POA: Insufficient documentation

## 2015-12-17 DIAGNOSIS — Z79899 Other long term (current) drug therapy: Secondary | ICD-10-CM | POA: Insufficient documentation

## 2015-12-17 DIAGNOSIS — Z7951 Long term (current) use of inhaled steroids: Secondary | ICD-10-CM | POA: Insufficient documentation

## 2015-12-17 DIAGNOSIS — J029 Acute pharyngitis, unspecified: Secondary | ICD-10-CM | POA: Diagnosis present

## 2015-12-17 DIAGNOSIS — J069 Acute upper respiratory infection, unspecified: Secondary | ICD-10-CM

## 2015-12-17 LAB — RAPID STREP SCREEN (MED CTR MEBANE ONLY): STREPTOCOCCUS, GROUP A SCREEN (DIRECT): NEGATIVE

## 2015-12-17 MED ORDER — BENZONATATE 100 MG PO CAPS: 100.0000 mg | ORAL_CAPSULE | Freq: Three times a day (TID) | ORAL | Status: DC

## 2015-12-17 MED ORDER — OSELTAMIVIR PHOSPHATE 75 MG PO CAPS: 75.0000 mg | ORAL_CAPSULE | Freq: Two times a day (BID) | ORAL | Status: DC

## 2015-12-17 MED ORDER — DEXAMETHASONE SODIUM PHOSPHATE 10 MG/ML IJ SOLN
10.0000 mg | Freq: Once | INTRAMUSCULAR | Status: AC
  Administered 2015-12-17: 10 mg via INTRAMUSCULAR
  Filled 2015-12-17: qty 1

## 2015-12-17 MED ORDER — OXYCODONE-ACETAMINOPHEN 5-325 MG PO TABS
1.0000 | ORAL_TABLET | Freq: Once | ORAL | Status: AC
  Administered 2015-12-17: 1 via ORAL
  Filled 2015-12-17: qty 1

## 2015-12-17 MED ORDER — ONDANSETRON HCL 4 MG PO TABS: 4.0000 mg | ORAL_TABLET | Freq: Four times a day (QID) | ORAL | Status: DC

## 2015-12-17 NOTE — ED Provider Notes (Signed)
CSN: [id number]     Arrival date & time 12/17/15  0301 History  By signing my name below, I, Jessica Case, attest that this documentation has been prepared under the direction and in the presence of Orpah Greek, MD. Electronically Signed: Eustaquio Case, ED Scribe. 12/17/2015. 3:38 AM.   Chief Complaint  Patient presents with  . Sore Throat  . Fever   The history is provided by the patient. No language interpreter was used.     HPI Comments: Jessica Case is a 35 y.o. female who presents to the Emergency Department complaining of gradual onset, constant, sore throat x 2 days. She states that there is a knot underneath her chin which is concerning her. Pt also complains of rhinorrhea, watery eyes, vomiting, and cough. Denies diarrhea or any other associated symptoms.  Past Medical History  Diagnosis Date  . Fallopian tube abscess   . Paranoid schizophrenia ([place])   . Claustrophobia    Past Surgical History  Procedure Laterality Date  . Tooth extraction     Family History  Problem Relation Age of Onset  . Hypertension Mother    Social History  Substance Use Topics  . Smoking status: Current Every Day Smoker -- 0.10 packs/day    Types: Cigarettes  . Smokeless tobacco: Never Used  . Alcohol Use: 0.0 oz/week    0 Standard drinks or equivalent per week     Comment: occ beer    OB History    Gravida Para Term Preterm AB TAB SAB Ectopic Multiple Living   2 1 0 1 1 0 1   0     Review of Systems  HENT: Positive for rhinorrhea and sore throat.   Respiratory: Positive for cough.   Gastrointestinal: Positive for nausea and vomiting. Negative for diarrhea.  All other systems reviewed and are negative.     Allergies  Citrus; Strawberry extract; and Toradol  Home Medications   Prior to Admission medications   Medication Sig Start Date End Date Taking? Authorizing Provider  Aspirin-Salicylamide-Caffeine (BC HEADACHE POWDER PO) Take 1 packet by mouth daily as  needed (headache/migraine).   Yes Historical Provider, MD  fluticasone (FLONASE) 50 MCG/ACT nasal spray Place 2 sprays into both nostrils daily. 07/18/15  Yes Josalyn Funches, MD  ibuprofen (ADVIL,MOTRIN) 200 MG tablet Take 400 mg by mouth every 6 (six) hours as needed for moderate pain.   Yes Historical Provider, MD  loratadine (CLARITIN) 10 MG tablet Take 1 tablet (10 mg total) by mouth daily. Patient taking differently: Take 10 mg by mouth daily as needed for allergies.  03/07/15  Yes Josalyn Funches, MD  propranolol (INDERAL) 40 MG tablet Take 1 tablet (40 mg total) by mouth 2 (two) times daily. 09/04/15  Yes Pieter Partridge, DO  SUMAtriptan (IMITREX) 100 MG tablet Take 1tablet at earliest onset of headache.  May repeat x1 in 2 hours if headache persists or recurs.  Do not exceed 2 tablets in 24 hours 09/04/15  Yes Adam Telford Nab, DO  traZODone (DESYREL) 150 MG tablet Take 150 mg by mouth at bedtime as needed for sleep.    Yes Historical Provider, MD  ziprasidone (GEODON) 40 MG capsule Take 40 mg by mouth 2 (two) times daily with a meal.   Yes Historical Provider, MD  benzonatate (TESSALON) 100 MG capsule Take 1 capsule (100 mg total) by mouth every 8 (eight) hours. 12/17/15   Orpah Greek, MD  ondansetron (ZOFRAN) 4 MG tablet Take 1 tablet (4 mg  total) by mouth every 6 (six) hours. 12/17/15   Orpah Greek, MD  oseltamivir (TAMIFLU) 75 MG capsule Take 1 capsule (75 mg total) by mouth every 12 (twelve) hours. 12/17/15   Orpah Greek, MD   BP 139/95 mmHg  Pulse 72  Temp(Src) 98.3 F (36.8 C) (Oral)  Resp 20  Ht 5\' 1"  (1.549 m)  Wt 140 lb (63.504 kg)  BMI 26.47 kg/m2  SpO2 98%  LMP 11/21/2015   Physical Exam  Constitutional: She is oriented to person, place, and time. She appears well-developed and well-nourished. No distress.  HENT:  Head: Normocephalic and atraumatic.  Right Ear: Hearing normal.  Left Ear: Hearing normal.  Nose: Rhinorrhea present.  Mouth/Throat:  Mucous membranes are normal. Posterior oropharyngeal erythema present. No oropharyngeal exudate.  Submanibular large lymph node  Eyes: Conjunctivae and EOM are normal. Pupils are equal, round, and reactive to light.  Neck: Normal range of motion. Neck supple.  Cardiovascular: Regular rhythm, S1 normal and S2 normal.  Exam reveals no gallop and no friction rub.   No murmur heard. Pulmonary/Chest: Effort normal and breath sounds normal. No respiratory distress. She exhibits no tenderness.  Abdominal: Soft. Normal appearance and bowel sounds are normal. There is no hepatosplenomegaly. There is no tenderness. There is no rebound, no guarding, no tenderness at McBurney's point and negative Murphy's sign. No hernia.  Musculoskeletal: Normal range of motion.  Neurological: She is alert and oriented to person, place, and time. She has normal strength. No cranial nerve deficit or sensory deficit. Coordination normal. GCS eye subscore is 4. GCS verbal subscore is 5. GCS motor subscore is 6.  Skin: Skin is warm, dry and intact. No rash noted. No cyanosis.  Psychiatric: She has a normal mood and affect. Her speech is normal and behavior is normal. Thought content normal.  Nursing note and vitals reviewed.   ED Course  Procedures (including critical care time)  DIAGNOSTIC STUDIES: Oxygen Saturation is 98% on RA, normal by my interpretation.    COORDINATION OF CARE: 3:35 AM-Discussed treatment plan which includes rapid strep test and flu test with pt at bedside and pt agreed to plan.    Labs Review Labs Reviewed  RAPID STREP SCREEN (NOT AT [facility])  CULTURE, GROUP A STREP [facility])    Imaging Review No results found. I have personally reviewed and evaluated these lab results as part of my medical decision-making.   EKG Interpretation None      MDM   Final diagnoses:  URI (upper respiratory infection)  Influenza   Patient presents to the ER for evaluation of flulike symptoms. She has had  headache, nasal drainage, sore throat, cough, nausea, vomiting. Rapid strep was negative. Symptoms consistent with viral process, will treat empirically as flu.   I personally performed the services described in this documentation, which was scribed in my presence. The recorded information has been reviewed and is accurate.       Orpah Greek, MD 12/17/15 [phone]

## 2015-12-17 NOTE — ED Notes (Signed)
MD at bedside. 

## 2015-12-17 NOTE — ED Notes (Signed)
Pt complains of a knot under her chin, fevers and a sore throat for two days

## 2015-12-17 NOTE — Discharge Instructions (Signed)
Influenza, Adult °Influenza ("the flu") is a viral infection of the respiratory tract. It occurs more often in winter months because people spend more time in close contact with one another. Influenza can make you feel very sick. Influenza easily spreads from person to person (contagious). °CAUSES  °Influenza is caused by a virus that infects the respiratory tract. You can catch the virus by breathing in droplets from an infected person's cough or sneeze. You can also catch the virus by touching something that was recently contaminated with the virus and then touching your mouth, nose, or eyes. °RISKS AND COMPLICATIONS °You may be at risk for a more severe case of influenza if you smoke cigarettes, have diabetes, have chronic heart disease (such as heart failure) or lung disease (such as asthma), or if you have a weakened immune system. Elderly people and pregnant women are also at risk for more serious infections. The most common problem of influenza is a lung infection (pneumonia). Sometimes, this problem can require emergency medical care and may be life threatening. °SIGNS AND SYMPTOMS  °Symptoms typically last 4 to 10 days and may include: °· Fever. °· Chills. °· Headache, body aches, and muscle aches. °· Sore throat. °· Chest discomfort and cough. °· Poor appetite. °· Weakness or feeling tired. °· Dizziness. °· Nausea or vomiting. °DIAGNOSIS  °Diagnosis of influenza is often made based on your history and a physical exam. A nose or throat swab test can be done to confirm the diagnosis. °TREATMENT  °In mild cases, influenza goes away on its own. Treatment is directed at relieving symptoms. For more severe cases, your health care provider may prescribe antiviral medicines to shorten the sickness. Antibiotic medicines are not effective because the infection is caused by a virus, not by bacteria. °HOME CARE INSTRUCTIONS °· Take medicines only as directed by your health care provider. °· Use a cool mist humidifier  to make breathing easier. °· Get plenty of rest until your temperature returns to normal. This usually takes 3 to 4 days. °· Drink enough fluid to keep your urine clear or pale yellow. °· Cover your mouth and nose when coughing or sneezing, and wash your hands well to prevent the virus from spreading. °· Stay home from work or school until the fever is gone for at least 1 full day. °PREVENTION  °An annual influenza vaccination (flu shot) is the best way to avoid getting influenza. An annual flu shot is now routinely recommended for all adults in the [place] °SEEK MEDICAL CARE IF: °· You experience chest pain, your cough worsens, or you produce more mucus. °· You have nausea, vomiting, or diarrhea. °· Your fever returns or gets worse. °SEEK IMMEDIATE MEDICAL CARE IF: °· You have trouble breathing, you become short of breath, or your skin or nails become bluish. °· You have severe pain or stiffness in the neck. °· You develop a sudden headache, or pain in the face or ear. °· You have nausea or vomiting that you cannot control. °MAKE SURE YOU:  °· Understand these instructions. °· Will watch your condition. °· Will get help right away if you are not doing well or get worse. °  °This information is not intended to replace advice given to you by your health care provider. Make sure you discuss any questions you have with your health care provider. °  °Document Released: 10/02/2000 Document Revised: 10/26/2014 Document Reviewed: 01/04/2012 °Elsevier Interactive Patient Education ©2016 Else[employer] Inc[employer] ° °Upper Respiratory Infection, Adult °Most upper respiratory infections (URIs) are a viral infection of the air passages leading to the lungs. A URI affects the nose, throat, and upper air passages. The most common   type of URI is nasopharyngitis and is typically referred to as "the common cold." °URIs run their course and usually go away on their own. Most of the time, a URI does not require medical attention, but sometimes a  bacterial infection in the upper airways can follow a viral infection. This is called a secondary infection. Sinus and middle ear infections are common types of secondary upper respiratory infections. °Bacterial pneumonia can also complicate a URI. A URI can worsen asthma and chronic obstructive pulmonary disease (COPD). Sometimes, these complications can require emergency medical care and may be life threatening.  °CAUSES °Almost all URIs are caused by viruses. A virus is a type of germ and can spread from one person to another.  °RISKS FACTORS °You may be at risk for a URI if:  °· You smoke.   °· You have chronic heart or lung disease. °· You have a weakened defense (immune) system.   °· You are very young or very old.   °· You have nasal allergies or asthma. °· You work in crowded or poorly ventilated areas. °· You work in health care facilities or schools. °SIGNS AND SYMPTOMS  °Symptoms typically develop 2-3 days after you come in contact with a cold virus. Most viral URIs last 7-10 days. However, viral URIs from the influenza virus (flu virus) can last 14-18 days and are typically more severe. Symptoms may include:  °· Runny or stuffy (congested) nose.   °· Sneezing.   °· Cough.   °· Sore throat.   °· Headache.   °· Fatigue.   °· Fever.   °· Loss of appetite.   °· Pain in your forehead, behind your eyes, and over your cheekbones (sinus pain). °· Muscle aches.   °DIAGNOSIS  °Your health care provider may diagnose a URI by: °· Physical exam. °· Tests to check that your symptoms are not due to another condition such as: °¨ Strep throat. °¨ Sinusitis. °¨ Pneumonia. °¨ Asthma. °TREATMENT  °A URI goes away on its own with time. It cannot be cured with medicines, but medicines may be prescribed or recommended to relieve symptoms. Medicines may help: °· Reduce your fever. °· Reduce your cough. °· Relieve nasal congestion. °HOME CARE INSTRUCTIONS  °· Take medicines only as directed by your health care provider.    °· Gargle warm saltwater or take cough drops to comfort your throat as directed by your health care provider. °· Use a warm mist humidifier or inhale steam from a shower to increase air moisture. This may make it easier to breathe. °· Drink enough fluid to keep your urine clear or pale yellow.   °· Eat soups and other clear broths and maintain good nutrition.   °· Rest as needed.   °· Return to work when your temperature has returned to normal or as your health care provider advises. You may need to stay home longer to avoid infecting others. You can also use a face mask and careful hand washing to prevent spread of the virus. °· Increase the usage of your inhaler if you have asthma.   °· Do not use any tobacco products, including cigarettes, chewing tobacco, or electronic cigarettes. If you need help quitting, ask your health care provider. °PREVENTION  °The best way to protect yourself from getting a cold is to practice good hygiene.  °· Avoid oral or hand contact with people with cold symptoms.   °· Wash your hands often if contact occurs.   °There is no clear evidence that vitamin C, vitamin E, echinacea, or exercise reduces the chance of developing a cold. However, it   is always recommended to get plenty of rest, exercise, and practice good nutrition.  °SEEK MEDICAL CARE IF:  °· You are getting worse rather than better.   °· Your symptoms are not controlled by medicine.   °· You have chills. °· You have worsening shortness of breath. °· You have brown or red mucus. °· You have yellow or brown nasal discharge. °· You have pain in your face, especially when you bend forward. °· You have a fever. °· You have swollen neck glands. °· You have pain while swallowing. °· You have white areas in the back of your throat. °SEEK IMMEDIATE MEDICAL CARE IF:  °· You have severe or persistent: °¨ Headache. °¨ Ear pain. °¨ Sinus pain. °¨ Chest pain. °· You have chronic lung disease and any of the  following: °¨ Wheezing. °¨ Prolonged cough. °¨ Coughing up blood. °¨ A change in your usual mucus. °· You have a stiff neck. °· You have changes in your: °¨ Vision. °¨ Hearing. °¨ Thinking. °¨ Mood. °MAKE SURE YOU:  °· Understand these instructions. °· Will watch your condition. °· Will get help right away if you are not doing well or get worse. °  °This information is not intended to replace advice given to you by your health care provider. Make sure you discuss any questions you have with your health care provider. °  °Document Released: 03/31/2001 Document Revised: 02/19/2015 Document Reviewed: 01/10/2014 °Elsevier Interactive Patient Education ©2016 Elsevier Inc. ° °

## 2015-12-18 ENCOUNTER — Encounter: Payer: Self-pay | Admitting: Podiatry

## 2015-12-18 DIAGNOSIS — M2012 Hallux valgus (acquired), left foot: Secondary | ICD-10-CM | POA: Diagnosis not present

## 2015-12-19 LAB — CULTURE, GROUP A STREP (THRC)

## 2015-12-20 ENCOUNTER — Encounter (HOSPITAL_COMMUNITY): Payer: Self-pay

## 2015-12-20 ENCOUNTER — Encounter (HOSPITAL_COMMUNITY): Payer: Self-pay | Admitting: Emergency Medicine

## 2015-12-20 ENCOUNTER — Emergency Department (HOSPITAL_COMMUNITY)
Admission: EM | Admit: 2015-12-20 | Discharge: 2015-12-20 | Disposition: A | Discharge status: Home / Self Care | Payer: Medicaid Other | Attending: Emergency Medicine | Admitting: Emergency Medicine

## 2015-12-20 ENCOUNTER — Emergency Department (HOSPITAL_COMMUNITY)
Admission: EM | Admit: 2015-12-20 | Discharge: 2015-12-20 | Disposition: A | Discharge status: Home / Self Care | Payer: Medicaid Other | Source: Home / Self Care | Attending: Emergency Medicine | Admitting: Emergency Medicine

## 2015-12-20 DIAGNOSIS — F17210 Nicotine dependence, cigarettes, uncomplicated: Secondary | ICD-10-CM

## 2015-12-20 DIAGNOSIS — M79673 Pain in unspecified foot: Secondary | ICD-10-CM | POA: Diagnosis not present

## 2015-12-20 DIAGNOSIS — Z4801 Encounter for change or removal of surgical wound dressing: Secondary | ICD-10-CM | POA: Diagnosis not present

## 2015-12-20 DIAGNOSIS — M79672 Pain in left foot: Secondary | ICD-10-CM | POA: Insufficient documentation

## 2015-12-20 NOTE — ED Notes (Signed)
Pt presents with c/o left foot pain after having a bunion removed on Wednesday. Pt reports the wound was bleeding last night and the area is painful at this time. No bleeding noted at this time.

## 2015-12-20 NOTE — ED Notes (Signed)
Pt sts recent sx with bunion removal and was having some bleeding last night; no bleeding noted at present

## 2015-12-20 NOTE — ED Notes (Signed)
Patient reports she had bunionectomy 2days ago by Dr. Earleen Newport.  Patient states pain has increased since procedure and she had one episode of bleeding.  Patient arrived with dirty bandage and packaging tape wrapped around left foot. When removing the bandage, patient lightly hit the hand of this Probation officer.  Redness and swelling was noted around incision.  Patient stated she attempted to call Dr. Earleen Newport but stated "I couldn't reach him".  Patient then reported she needed to leave because she was going to miss her ride.  Patient was advised that her foot needed to be examined by provider, and patient refused.  Patient advised to attempt to call Dr. Earleen Newport to be seen in office as soon as possible.  Patient verbalized understanding. Patient was rolled to lobby in wheelchair.

## 2015-12-21 NOTE — ED Provider Notes (Signed)
This patient was shown to the room, however, eloped prior to my patient contact or assessment. See triage RN's note for reported complaints. RN reports that the patient had no difficulty ambulating to the room.  Filed Vitals:   12/20/15 1319  BP: 150/100  Pulse: 76  Temp: 97.9 F (36.6 C)  TempSrc: Oral  Resp: 20  SpO2: 99%     Lorayne Bender, PA-C 12/21/15 1051  Tanna Furry, MD 12/30/15 0025

## 2015-12-26 ENCOUNTER — Telehealth: Payer: Self-pay | Admitting: *Deleted

## 2015-12-26 ENCOUNTER — Telehealth: Payer: Self-pay | Admitting: Podiatry

## 2015-12-26 MED ORDER — CEPHALEXIN 500 MG PO CAPS: 500.0000 mg | ORAL_CAPSULE | Freq: Three times a day (TID) | ORAL | Status: DC

## 2015-12-26 MED ORDER — OXYCODONE-ACETAMINOPHEN 10-325 MG PO TABS: 1.0000 | ORAL_TABLET | ORAL | Status: DC | PRN

## 2015-12-26 NOTE — Telephone Encounter (Signed)
I do not recommend changing it but since she has keep antibiotic ointment on it and a bandage.

## 2015-12-26 NOTE — Progress Notes (Signed)
DOS 12/18/2015 - left foot surgical correction of bunion with screw fixation.

## 2015-12-26 NOTE — Telephone Encounter (Addendum)
Pt presented to office for refill of pain medication prescribed by Dr. Jacqualyn Posey at [facility] 12/18/2015.  I was on the phone with another pt and instructed [facility] front office staff that I was unable to refill the medication without orders from Dr. Jacqualyn Posey.  I reviewed pt's medication list and the Percocet pt requested had not been ordered through [facility] and I reviewed surgery chart Percocet 10/325 #40 one tablet every 4 hours was ordered.  INFORMED PT THAT DR. Jacqualyn Posey HAD REFILLED the Percocet, but she would need to be seen on 12/30/2015.  Pt agreed and states will set up appt for 12/30/2015 when she picks up her medication. I asked pt if she was having a problem currently.  Pt states not now, but went to ER and they changed the dressing, and she's been changing it and cleaning it with sterile water and it looks good.  12/26/2015-PT PRESENTS TO PICK UP PERCOCET RX.  I asked pt to allow me to evaluate the left foot surgery site.  Pt is in cam walker with only the 2 straps across the dorsal foot strapped closed.  I removed the gauze dressing and the incision site was closed without drainage, there was swelling along the incision and the left 1st MPJ.  I covered left 1st metatarsal incision site with betadine soaked sterile gauze, and padded with 3 inch sterile gauze and wrapped with 2 inch cling roll gauze, covered with 3 inch white coflex and surgical stockinette, and instructed pt not to remove the dressing until Dr. Jacqualyn Posey or his assistant removed 12/30/2015 appt.  I refilled the Keflex as prescribed by Dr. Jacqualyn Posey on 12/18/2015.

## 2015-12-26 NOTE — Telephone Encounter (Signed)
OK to refill but she MUST come in. She can come in today to see Janett Billow for POV 1 or she must come in Monday if she cannot come in tomorrow. She did go to the ER but it looks like she left without being seen.

## 2015-12-26 NOTE — Telephone Encounter (Signed)
Pt called in wanting to get a Refill on her RX (oxycodone 10/325). Pt had surgery on the 1st on march and will not be able to make the appt [id number] 12/27/2015 due to court

## 2015-12-27 ENCOUNTER — Encounter: Payer: Self-pay | Admitting: Podiatry

## 2015-12-30 ENCOUNTER — Encounter: Payer: Self-pay | Admitting: Podiatry

## 2015-12-30 ENCOUNTER — Ambulatory Visit (INDEPENDENT_AMBULATORY_CARE_PROVIDER_SITE_OTHER): Payer: Medicaid Other | Admitting: Podiatry

## 2015-12-30 ENCOUNTER — Ambulatory Visit (INDEPENDENT_AMBULATORY_CARE_PROVIDER_SITE_OTHER): Payer: Medicaid Other

## 2015-12-30 VITALS — BP 140/93 | HR 85 | Resp 18

## 2015-12-30 DIAGNOSIS — M21612 Bunion of left foot: Secondary | ICD-10-CM

## 2015-12-30 DIAGNOSIS — M216X2 Other acquired deformities of left foot: Secondary | ICD-10-CM

## 2015-12-30 DIAGNOSIS — Z9889 Other specified postprocedural states: Secondary | ICD-10-CM

## 2015-12-30 NOTE — Progress Notes (Signed)
Patient ID: Jessica Case, female   DOB: 12/05/1980, 35 y.o.   MRN: [id number]  Subjective: Jessica Case is a 34 y.o. is seen today in office s/p left [place] bunionectomy preformed on 12-18-15. She states that she has not been having much pain and she is doing better. She did have to emergency room due to some pain and she had a small amount of blood on the bandage. She also did come in the office today, new pain medicine prescription that time she was seen by the nurse to evaluate the wound that she had a cancel her last appointment. She does that she has walked without the boot.Denies any systemic complaints such as fevers, chills, nausea, vomiting. No calf pain, chest pain, shortness of breath.   Objective: General: No acute distress, AAOx3 ; presents today wearing a cam boot with only using 2 straps. DP/PT pulses palpable 2/4, CRT < 3 sec to all digits.  Protective sensation intact. Motor function intact.  Left  foot: Incision is well coapted without any evidence of dehiscence. There is no surrounding erythema, ascending cellulitis, fluctuance, crepitus, malodor, drainage/purulence. There is mild edema around the surgical site. There is mild pain along the surgical site. The toe sits in a rectus position and there is no pain with first MTPJ range of motion or restriction. No other areas of tenderness to bilateral lower extremities.  No other open lesions or pre-ulcerative lesions.  No pain with calf compression, swelling, warmth, erythema.   Assessment and Plan:  Status post left [place] bunionectomy  -Treatment options discussed including all alternatives, risks, and complications -X-rays were obtained and reviewed with the patient. Protective A be some shifting of the capital fragment of the first metatarsal from the bunion site. Discussed these changes with the patient. She elects to hold off on any further surgical intervention at this time. -Ice/elevation -Pain medication as  needed. -continuing the cam boot at all times even while sleeping. Discussed the importance of her wearing the cam boot appropriately. -Monitor for any clinical signs or symptoms of infection and DVT/PE and directed to call the office immediately should any occur or go to the ER. -Follow-up as in 10-14 days for suture removal or sooner if any problems arise. In the meantime, encouraged to call the office with any questions, concerns, change in symptoms.   [name], DPM

## 2015-12-31 ENCOUNTER — Ambulatory Visit: Payer: Medicaid Other | Admitting: Neurology

## 2016-01-06 ENCOUNTER — Emergency Department (HOSPITAL_COMMUNITY)
Admission: EM | Admit: 2016-01-06 | Discharge: 2016-01-06 | Disposition: A | Discharge status: Home / Self Care | Payer: Medicaid Other | Attending: Emergency Medicine | Admitting: Emergency Medicine

## 2016-01-06 ENCOUNTER — Encounter (HOSPITAL_COMMUNITY): Payer: Self-pay | Admitting: Emergency Medicine

## 2016-01-06 DIAGNOSIS — M79609 Pain in unspecified limb: Secondary | ICD-10-CM

## 2016-01-06 DIAGNOSIS — F200 Paranoid schizophrenia: Secondary | ICD-10-CM | POA: Diagnosis not present

## 2016-01-06 DIAGNOSIS — Z792 Long term (current) use of antibiotics: Secondary | ICD-10-CM | POA: Diagnosis not present

## 2016-01-06 DIAGNOSIS — G8918 Other acute postprocedural pain: Secondary | ICD-10-CM

## 2016-01-06 DIAGNOSIS — F17210 Nicotine dependence, cigarettes, uncomplicated: Secondary | ICD-10-CM | POA: Insufficient documentation

## 2016-01-06 DIAGNOSIS — Z7951 Long term (current) use of inhaled steroids: Secondary | ICD-10-CM | POA: Diagnosis not present

## 2016-01-06 DIAGNOSIS — Z79899 Other long term (current) drug therapy: Secondary | ICD-10-CM | POA: Insufficient documentation

## 2016-01-06 DIAGNOSIS — Z8742 Personal history of other diseases of the female genital tract: Secondary | ICD-10-CM | POA: Diagnosis not present

## 2016-01-06 DIAGNOSIS — Z9889 Other specified postprocedural states: Secondary | ICD-10-CM | POA: Diagnosis not present

## 2016-01-06 DIAGNOSIS — I10 Essential (primary) hypertension: Secondary | ICD-10-CM | POA: Insufficient documentation

## 2016-01-06 DIAGNOSIS — M79672 Pain in left foot: Secondary | ICD-10-CM | POA: Diagnosis present

## 2016-01-06 MED ORDER — MELOXICAM 7.5 MG PO TABS: 7.5000 mg | ORAL_TABLET | Freq: Every day | ORAL | Status: DC | PRN

## 2016-01-06 NOTE — ED Notes (Signed)
PT presents requesting Pain meds for post op pain to RT foot. Pt reports having a bunionectomy on RT . PT has not called the DR .

## 2016-01-06 NOTE — Discharge Instructions (Signed)
Read the information below.  Use the prescribed medication as directed.  Please discuss all new medications with your pharmacist.  You may return to the Emergency Department at any time for worsening condition or any new symptoms that concern you.    If you develop redness, swelling, pus draining from the wound, or fevers greater than 100.4, return to the ER immediately for a recheck.     Pain Relief Preoperatively and Postoperatively If you have questions, problems, or concerns about the pain that you may feel after surgery, let your health care provider know.Patients have the right to assessment and management of pain. Severe pain after surgery--and the fear or anxiety associated with that pain--may cause extreme discomfort that:  Prevents sleep.  Decreases the ability to breathe deeply and to cough. This can result in pneumonia or other upper airway infections.  Causes the heart to beat more quickly and the blood pressure to be higher.  Increases the risk for constipation and bloating.  Decreases the ability of wounds to heal.  May result in depression, increased anxiety, and feelings of helplessness. Relieving pain before surgery (preoperatively) is also important because it lessens pain that you have after surgery (postoperatively). Patients who receive pain relief both before and after surgery experience greater pain relief than those who receive pain relief only after surgery. Let your health care provider know if you are having uncontrolled pain.This is very important.Pain after surgery is more difficult to manage if it is severe, so receiving prompt and adequate treatment of acute pain is necessary. If you become constipated after taking pain medicine, drink more liquids if you can. Your health care provider may have you take a mild laxative. PAIN CONTROL METHODS Your health care providers follow policies and procedures about the management of your pain.These guidelines should be  explained to you before surgery.Plans for pain control after surgery must be decided upon by you and your health care provider and put into use with your full understanding and agreement.Do not be afraid to ask questions about the care that you are receiving. Your health care providers will attempt to control your pain in various ways, and these methods may be used together (multimodal analgesia). Using this approach has many benefits for you, including being able to eat, move around, and leave the hospital sooner. As-Needed Pain Control  You may be given pain medicine through an IV tube or as a pill or liquid that you can swallow. Let your health care provider know when you are having pain, and he or she will give you the pain medicine that is ordered for you. IV Patient-Controlled Analgesia (PCA) Pump  You can receive your pain medicine through an IV tube that goes into one of your veins. You can control the amount of pain medicine that you get. The pain medicine is controlled by a pump. When you push the button that is hooked up to this pump, you receive a specific amount of pain medicine. This button should be pushed only by you or by someone who is specifically assigned by you to do so. It is set up to keep you from accidentally giving yourself too much pain medicine. You will be able to start using your pain pump in the recovery room after your surgery. This method can be helpful for most types of surgery.  Tell your health care provider:  If you are having too much pain.  If you are feeling too sleepy or nauseous. Continuous Epidural Pain Control  A thin, soft tube (catheter) is put into your back, outside the outer layer of your spinal cord. Pain medicine flows through the catheter to lessen pain in areas of your body that are below the level of catheter placement. Continuous epidural pain control may work best for you if you are having surgery on your abdomen, hip area, or legs. The  epidural catheter is usually put into your back shortly before surgery. It is left in until you can eat, take medicine by mouth, pass urine, and have a bowel movement.  Giving pain medicine through the epidural catheter may help you to heal more quickly because you can do these things sooner:  Regain normal bowel and bladder function.  Return to eating.  Get up and walk. Medicine That Numbs the Area (Local Anesthetic) You may be given pain medicine:  As an injection near the area of the pain (local infiltration).  As an injection near the nerve that controls the sensation to a specific part of your body (peripheral nerve block).  In your spine to block pain (spinal block).  Through a local anesthetic reservoir pump. If your surgeon or anesthesiologist selects this option as a part of your pain control, one or more thin, soft tubes will be inserted into your incision site(s) at the end of surgery. These tubes will be connected to a device that is filled with a non-narcotic pain medicine. This medicine gradually empties into your incision site over the next several days. Usually, after all of the medicine is used, your health care provider will remove the tubes and throw away the device. Opioids  Moderate to moderately severe acute pain after surgery may respond to opioids.Opioids are narcotic pain medicine. Opioids are often combined with non-narcotic medicines to improve pain relief, lower the risk of side effects, and reduce the chance of addiction.  If you follow your health care provider's directions about taking opioids and you do not have a history of substance abuse, your risk of becoming addicted is very small.To prevent addiction, opioids are given for short periods of time in careful doses. Other Methods of Pain Control  Steroids.  Physical therapy.  Heat and cold therapy.  Compression, such as wrapping an elastic bandage around the area of the pain.  Massage.   This  information is not intended to replace advice given to you by your health care provider. Make sure you discuss any questions you have with your health care provider.   Document Released: 12/26/2002 Document Revised: 10/26/2014 Document Reviewed: 12/30/2010 Elsevier Interactive Patient Education [employer].

## 2016-01-06 NOTE — ED Provider Notes (Signed)
CSN: [id number]     Arrival date & time 01/06/16  [id number] History  By signing my name below, I, Emmanuella Mensah, attest that this documentation has been prepared under the direction and in the presence of Kanae Ignatowski, PA-C. Electronically Signed: Judithann Sauger, ED Scribe. 01/06/2016. 10:48 AM.    Chief Complaint  Patient presents with  . Foot Pain   The history is provided by the patient. No language interpreter was used.   HPI Comments: Jessica Case is a 35 y.o. female with a hx of HTN who presents to the Emergency Department complaining of persistent pain to her left great toe s/p bunionectomy on 12/18/15 by Dr. Earleen Newport. She reports associated swelling. She states that she has been compliant with her antibiotics but ran out of her pain medication yesterday. Pt has upcoming appointment with her surgeon on 01/10/16. She denies any fever, chills, SOB, CP, or any other complaints.    Past Medical History  Diagnosis Date  . Fallopian tube abscess   . Paranoid schizophrenia ([place])   . Claustrophobia    Past Surgical History  Procedure Laterality Date  . Tooth extraction     Family History  Problem Relation Age of Onset  . Hypertension Mother    Social History  Substance Use Topics  . Smoking status: Current Every Day Smoker -- 0.10 packs/day    Types: Cigarettes  . Smokeless tobacco: Never Used  . Alcohol Use: 0.0 oz/week    0 Standard drinks or equivalent per week     Comment: occ beer    OB History    Gravida Para Term Preterm AB TAB SAB Ectopic Multiple Living   2 1 0 1 1 0 1   0     Review of Systems  Constitutional: Negative for fever and chills.  Respiratory: Negative for shortness of breath.   Cardiovascular: Negative for chest pain.  Musculoskeletal: Positive for arthralgias (left foot).  Skin: Negative for color change.  Allergic/Immunologic: Negative for immunocompromised state.  Neurological: Negative for weakness and numbness.  Psychiatric/Behavioral:  Negative for self-injury.      Allergies  Citrus; Strawberry extract; and Toradol  Home Medications   Prior to Admission medications   Medication Sig Start Date End Date Taking? Authorizing Provider  Aspirin-Salicylamide-Caffeine (BC HEADACHE POWDER PO) Take 1 packet by mouth daily as needed (headache/migraine).    Historical Provider, MD  benzonatate (TESSALON) 100 MG capsule Take 1 capsule (100 mg total) by mouth every 8 (eight) hours. 12/17/15   Orpah Greek, MD  cephALEXin (KEFLEX) 500 MG capsule Take 1 capsule (500 mg total) by mouth 3 (three) times daily. 12/26/15   Trula Slade, DPM  fluticasone (FLONASE) 50 MCG/ACT nasal spray Place 2 sprays into both nostrils daily. 07/18/15   Josalyn Funches, MD  loratadine (CLARITIN) 10 MG tablet Take 1 tablet (10 mg total) by mouth daily. Patient taking differently: Take 10 mg by mouth daily as needed for allergies.  03/07/15   Josalyn Funches, MD  meloxicam (MOBIC) 7.5 MG tablet Take 1 tablet (7.5 mg total) by mouth daily as needed for pain. 01/06/16   Clayton Bibles, PA-C  ondansetron (ZOFRAN) 4 MG tablet Take 1 tablet (4 mg total) by mouth every 6 (six) hours. 12/17/15   Orpah Greek, MD  oseltamivir (TAMIFLU) 75 MG capsule Take 1 capsule (75 mg total) by mouth every 12 (twelve) hours. 12/17/15   Orpah Greek, MD  oxyCODONE-acetaminophen (PERCOCET) 10-325 MG tablet Take 1 tablet by mouth every  4 (four) hours as needed for pain. 12/26/15   Trula Slade, DPM  promethazine (PHENERGAN) 25 MG tablet Take 25 mg by mouth every 8 (eight) hours as needed for nausea or vomiting.    Trula Slade, DPM  propranolol (INDERAL) 40 MG tablet Take 1 tablet (40 mg total) by mouth 2 (two) times daily. 09/04/15   Pieter Partridge, DO  SUMAtriptan (IMITREX) 100 MG tablet Take 1tablet at earliest onset of headache.  May repeat x1 in 2 hours if headache persists or recurs.  Do not exceed 2 tablets in 24 hours 09/04/15   Pieter Partridge, DO   traZODone (DESYREL) 150 MG tablet Take 150 mg by mouth at bedtime as needed for sleep.     Historical Provider, MD  ziprasidone (GEODON) 40 MG capsule Take 40 mg by mouth 2 (two) times daily with a meal.    Historical Provider, MD   BP 130/91 mmHg  Pulse 79  Temp(Src) 97.8 F (36.6 C) (Oral)  Resp 18  Ht 5\' 1"  (1.549 m)  Wt 140 lb (63.504 kg)  BMI 26.47 kg/m2  SpO2 100%  LMP 12/19/2015 Physical Exam  Constitutional: She appears well-developed and well-nourished. No distress.  HENT:  Head: Normocephalic and atraumatic.  Neck: Neck supple.  Pulmonary/Chest: Effort normal.  Musculoskeletal: She exhibits edema and tenderness.  Left great toe with healing surgical incision over dorsum of first MTP, no erythema, warmth, or discharge. Diffuses edema and tenderness Moves great toe Sensation intact Cap refill <2 sec  Neurological: She is alert.  Skin: She is not diaphoretic.  Nursing note and vitals reviewed. Limping but fast gait.   ED Course  Procedures (including critical care time) DIAGNOSTIC STUDIES: Oxygen Saturation is 100% on RA, normal by my interpretation.    COORDINATION OF CARE: 10:44 AM- Pt advised of plan for treatment and pt agrees. Advised to honor upcoming appointments. Pt will receive Mobic and a dress change.   [place] DEA database:  Pt received #40 Percocet 10-325 on 12/18/15 and again on 12/26/15.    MDM   Final diagnoses:  Postoperative pain of extremity    Afebrile, nontoxic patient with left great toe/foot pain s/p bunionectomy.  Has been getting percocet 10s from podiatrist, is able to follow up with him.  Mobic added as pt was advised to take ibuprofen but has not.  The surgical wound does not appear infected.   D/C home with mobic, podiatry follow up.  Discussed result, findings, treatment, and follow up  with patient.  Pt given return precautions.  Pt verbalizes understanding and agrees with plan.        I personally performed the services described in  this documentation, which was scribed in my presence. The recorded information has been reviewed and is accurate.   Clayton Bibles, PA-C 01/06/16 [place], DO 01/06/16 2117

## 2016-01-06 NOTE — ED Notes (Signed)
Pt reports that she had surgery to left foot 2 weeks ago. Pt reports she ran out of her pain medication. Pain increased last night.

## 2016-01-06 NOTE — ED Notes (Signed)
Declined W/C at D/C and was escorted to lobby by RN. 

## 2016-01-07 ENCOUNTER — Ambulatory Visit: Payer: Self-pay

## 2016-01-07 ENCOUNTER — Ambulatory Visit (INDEPENDENT_AMBULATORY_CARE_PROVIDER_SITE_OTHER): Payer: Medicaid Other

## 2016-01-07 ENCOUNTER — Telehealth: Payer: Self-pay | Admitting: *Deleted

## 2016-01-07 ENCOUNTER — Ambulatory Visit (INDEPENDENT_AMBULATORY_CARE_PROVIDER_SITE_OTHER): Payer: Medicaid Other | Admitting: Podiatry

## 2016-01-07 DIAGNOSIS — M21612 Bunion of left foot: Secondary | ICD-10-CM

## 2016-01-07 DIAGNOSIS — M216X2 Other acquired deformities of left foot: Secondary | ICD-10-CM | POA: Diagnosis not present

## 2016-01-07 DIAGNOSIS — Z9889 Other specified postprocedural states: Secondary | ICD-10-CM

## 2016-01-07 MED ORDER — OXYCODONE-ACETAMINOPHEN 10-325 MG PO TABS: 1.0000 | ORAL_TABLET | ORAL | Status: DC | PRN
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
## 2016-01-07 NOTE — Progress Notes (Signed)
Subjective:     Patient ID: Jessica Case, female   DOB: 1981-08-23, 35 y.o.   MRN: [id number]  HPI this patient presents the office with chief complaint of a painful left foot. Patient states that she had surgery on March 1 and has been in significant pain since the surgery. She says she had surgery on her right bunion, which healed uneventfully with minimal pain. She says upon movement of her big toe upward  there is a moving  of the bone in her foot at the surgical site. She went to the ER where she was seen and prescribed Mobic to control her pain. She presents to the office today wearing a cam walker in significant pain.   Review of Systems     Objective:   Physical ExamNeurovascular status intact both feet.  Her left foot has 2-3+ swelling at the dorsomedial aspect of her first metatarsal.  Upon dorsiflexion of the great toe there is rocking felt at the osteotomy site. Skin is healing with sutures intact.  No palpation of screw noted.       Assessment:     S/p foot surgery left foot.     Plan:     ROV>  X-rays were taken left foot. Prior to proceeding the x-rays taken today. Her x-rays from March 13 were examined. There appears to be a fracture line extending proximally from this screw, first metatarsal left foot. Examination of the x-rays taken today reveal healing at the previously located fracture site. My impression is that there is motion at the osteotomy site first metatarsal, left foot. Therefore, she had an Unna boot applied to her left foot. She was told to continue to ambulate in the cam walker. She was told to return to the office at her regularly scheduled appointment with Dr. Jacqualyn Posey on Friday. Finally, a prescription for Percocet 10/325 was dispensed return to the office in 3 days   Gardiner Barefoot DPM

## 2016-01-10 ENCOUNTER — Ambulatory Visit (INDEPENDENT_AMBULATORY_CARE_PROVIDER_SITE_OTHER): Payer: Medicaid Other | Admitting: Podiatry

## 2016-01-10 ENCOUNTER — Encounter: Payer: Self-pay | Admitting: Podiatry

## 2016-01-10 DIAGNOSIS — M21612 Bunion of left foot: Secondary | ICD-10-CM

## 2016-01-10 DIAGNOSIS — M216X2 Other acquired deformities of left foot: Secondary | ICD-10-CM | POA: Diagnosis not present

## 2016-01-10 DIAGNOSIS — R609 Edema, unspecified: Secondary | ICD-10-CM

## 2016-01-10 DIAGNOSIS — Z9889 Other specified postprocedural states: Secondary | ICD-10-CM

## 2016-01-10 MED ORDER — OXYCODONE-ACETAMINOPHEN 10-325 MG PO TABS: 1.0000 | ORAL_TABLET | Freq: Four times a day (QID) | ORAL | Status: DC | PRN

## 2016-01-10 NOTE — Patient Instructions (Signed)
Venous Thromboembolism Prevention Venous thromboembolism (VTE) is a condition in which a blood clot (thrombus) develops in the body. A thrombus usually occurs in a deep vein in the leg or the pelvis (DVT), but it can also occur in the arm. Sometimes, pieces of a thrombus can break off from its original place of development and travel through the bloodstream to other parts of the body. When that happens, the thrombus is called an embolus. An embolus that travels to one or both lungs is called a pulmonary embolism. An embolism can block the blood flow in the blood vessels of other organs as well. VTE is a serious health condition that can cause disability or death. It is very important to get help right away and to not ignore symptoms. HOW CAN A VTE BE PREVENTED?  Exercise regularly. Take a brisk 30 minute walk every day. Staying active and moving around can help you to prevent blood clots.  Avoid sitting or lying in bed for long periods of time without moving your legs. Change your position often, especially during long-distance travel (over 4 hours).  If you are a woman who is over 53 years of age, avoid unnecessary use of medicines that contain estrogen. These include birth control pills and hormone replacement therapy.  Do not smoke, especially if you take estrogen medicines. If you need help quitting, ask your health care provider.  Eat plenty of fruits and vegetables. Ask your health care provider or dietitian if there are foods that you should avoid.  Maintain a weight that is appropriate for your height. Ask your health care provider what weight is healthy for you.  Wear loose-fitting clothing. Avoid constrictive or tight clothing around your legs or waist.  Try not to bump or injure your legs. Avoid crossing your legs when you are sitting.  Do not use pillows under your knees while lying down unless told by your health care provider.  Wear support hose (compression stockings or TED  hose) as told by your health care provider Compression stockings increase blood flow in your legs and can help prevent blood clots. Do not let them bunch up when you are wearing them. HOW CAN I PREVENT VTE WHEN I TRAVEL? Long-distance travel (over 4 hours) can increase the risk of a VTE. To prevent VTE when traveling:  Exercise your legs every hour by standing, stretching, and bending and straightening your legs. If you are traveling by airplane, train, or bus, walk up and down the aisle as often as possible to get your blood moving. If you are traveling by car, stop and get out of the car every hour to exercise your legs and stretch. Other types of exercise might include:  Keeping your feet flat on the ground and raising your toes.  Switching from tightening the muscles in your calves and thighs to relaxing those same muscles while you are sitting.  Pointing and flexing your feet at the ankle joints while you are sitting.  Stay well hydrated while traveling. Drink enough water to keep your urine clear or pale yellow.  Avoid drinking alcohol during long travel. Generally, it is not recommended that you take medicines to prevent DVT during routine travel. HOW CAN VTE BE PREVENTED IF I AM HOSPITALIZED? A VTE may be prevented by taking medicines that are prescribed to prevent blood clots (anticoagulants). You can also help to prevent VTE while in the hospital by taking these actions:  Get out of bed and walk. Ask your health care provider  if this is safe for you to do.  Request the use of a sequential compression device (SCD). This is a machine that pumps air into compression sleeves that are wrapped around your legs.  Request the use of compression stockings, which are tight, elastic stockings that apply pressure to the lower legs. Compression stockings are sometimes used with SCDs. HOW CAN I PREVENT VTE AFTER SURGERY? Understand that there is an increased risk for VTE for the first 4-6 weeks  after surgery. During this time:  Avoid long-distance travel (over 4 hours). If you must travel during this time, ask your health care provider about additional preventive actions that you can take. These might include exercising your arms and legs every hour while you travel.  Avoid sitting or lying still for too long. If possible, get up and walk around one time every hour. Ask your health care provider when this is safe for you to do. SEEK IMMEDIATE MEDICAL CARE IF:  You have new or increased pain, swelling, or redness in an arm or leg.  You have numbness or tingling in an arm or leg.  You have shortness of breath while active or at rest.  You have chest pain.  You have a rapid or irregular heartbeat.  You feel light-headed or dizzy.  You cough up blood.  You notice blood in your vomit, bowel movement, or urine. These symptoms may represent a serious problem that is an emergency. Do not wait to see if the symptoms will go away. Get medical help right away. Call your local emergency services (911 in the [place] Do not drive yourself to the hospital.   This information is not intended to replace advice given to you by your health care provider. Make sure you discuss any questions you have with your health care provider.   Document Released: 09/23/2009 Document Revised: 06/26/2015 Document Reviewed: 01/30/2015 Elsevier Interactive Patient Education 2016 [employer]  

## 2016-01-12 NOTE — Progress Notes (Signed)
Patient ID: [name], female   DOB: 05-17-1981, 35 y.o.   MRN: [id number]  Subjective: Jessica Case is a 35 y.o. is seen today in office s/p left [place] bunionectomy. Since last appointment with me she did go to the emergency room for pain and she was given meloxicam. She also followed up with Dr. Prudence Davidson been in a banal which she states helps quite a bit. She is also continue taking pain medication although her pain is improving. She does continue with a cam boot although she does that she goes up the boot at times. She states that she was getting a popping sensation on the surgical site previously but this has resolved. Denies any systemic complaints such as fevers, chills, nausea, vomiting. No calf pain, chest pain, shortness of breath.   Objective: General: No acute distress, AAOx3  DP/PT pulses palpable 2/4, CRT < 3 sec to all digits.  Protective sensation intact. Motor function intact.  Left foot: Incision is well coapted without any evidence of dehiscence and suture ends intact. There is no surrounding erythema, ascending cellulitis, fluctuance, crepitus, malodor, drainage/purulence. There is decraesed edema around the surgical site. There is mild, but subjectively improved, pain along the surgical site.  No other areas of tenderness to bilateral lower extremities.  No other open lesions or pre-ulcerative lesions.  No pain with calf compression, swelling, warmth, erythema.   Assessment and Plan:  Status post left [place] bunionectomy, doing well with no complications   -Treatment options discussed including all alternatives, risks, and complications -X-rays from last appointment were reviewed. -Suture ends were cut today. -To place another in a boot on today at her request. Discussed that he remove the Unna boot in 5 days or sooner if there is any swelling, tenderness coloration, increased pain or any other problems. -Refilled Percocet today. She is almost out of the last  prescription she was given apparently. Hopefully we can start to decrease the amount of Percocet after this prescription. Continue ice and elevation. -Continue her cam boot at all times and at night. -Monitor for any clinical signs or symptoms of infection and DVT/PE and directed to call the office immediately should any occur or go to the ER. -Follow-up in 2 weeks or sooner if any problems arise. In the meantime, encouraged to call the office with any questions, concerns, change in symptoms.   [name], DPM

## 2016-01-24 ENCOUNTER — Encounter: Payer: Self-pay | Admitting: Podiatry

## 2016-01-24 ENCOUNTER — Encounter: Payer: Medicaid Other | Admitting: Podiatry

## 2016-01-24 ENCOUNTER — Ambulatory Visit (INDEPENDENT_AMBULATORY_CARE_PROVIDER_SITE_OTHER): Payer: Medicaid Other

## 2016-01-24 ENCOUNTER — Ambulatory Visit (INDEPENDENT_AMBULATORY_CARE_PROVIDER_SITE_OTHER): Payer: Medicaid Other | Admitting: Podiatry

## 2016-01-24 VITALS — BP 118/60 | HR 72 | Resp 12

## 2016-01-24 DIAGNOSIS — R609 Edema, unspecified: Secondary | ICD-10-CM

## 2016-01-24 DIAGNOSIS — M2012 Hallux valgus (acquired), left foot: Secondary | ICD-10-CM | POA: Diagnosis not present

## 2016-01-24 DIAGNOSIS — Z9889 Other specified postprocedural states: Secondary | ICD-10-CM

## 2016-01-24 MED ORDER — OXYCODONE-ACETAMINOPHEN 10-325 MG PO TABS: 1.0000 | ORAL_TABLET | ORAL | Status: DC | PRN

## 2016-01-28 DIAGNOSIS — R609 Edema, unspecified: Secondary | ICD-10-CM | POA: Insufficient documentation

## 2016-01-28 DIAGNOSIS — Z9889 Other specified postprocedural states: Secondary | ICD-10-CM | POA: Insufficient documentation

## 2016-01-28 DIAGNOSIS — M2010 Hallux valgus (acquired), unspecified foot: Secondary | ICD-10-CM | POA: Insufficient documentation

## 2016-01-28 NOTE — Progress Notes (Signed)
Patient ID: Jessica Case, female   DOB: Aug 25, 1981, 35 y.o.   MRN: [id number]  Subjective: Jessica Case is a 35 y.o. is seen today in office s/p left [place] bunionectomy. She states that she is still having pain as well as some swelling on the bunion site. She does that she does wear the cam boot although she does quite a bit of walking and standing. Denies any redness or warmth of the foot. No recent injury or trauma. Pain medicine does control the pain. Denies any systemic complaints such as fevers, chills, nausea, vomiting. No calf pain, chest pain, shortness of breath.   Objective: General: No acute distress, AAOx3  DP/PT pulses palpable 2/4, CRT < 3 sec to all digits.  Protective sensation intact. Motor function intact.  Left foot: Incision is well coapted without any evidence of dehiscence and a scar has formed. There is no surrounding erythema, ascending cellulitis, fluctuance, crepitus, malodor, drainage/purulence. There is decraesed edema around the surgical site although does continue. There is still some discomfort along the surgical site. There is mild discomfort with first MTPJ range of motion. No other areas of tenderness to bilateral lower extremities.  No other open lesions or pre-ulcerative lesions.  No pain with calf compression, swelling, warmth, erythema.   Assessment and Plan:  Status post left [place] bunionectomy, doing well with no complications   -Treatment options discussed including all alternatives, risks, and complications -X-rays were obtained and reviewed with the patient. There has been some movement across the osteotomy site however screw is present. There appears to be some secondary healing across the osteotomy. I discussed the signs of the patient today. -Recommend nonweightbearing in a cam boot. She has crutches at home. Aspirin while nonweightbearing. -Unna boot applied. Discussed visually increase in swelling, toe discoloration or pain to go ahead and  remove the [employer]. Otherwise can remove in 5 days.  -Pain medication prn -Monitor for any clinical signs or symptoms of infection and DVT/PE and directed to call the office immediately should any occur or go to the ER. -Follow-up in 2 weeks or sooner if any problems arise. In the meantime, encouraged to call the office with any questions, concerns, change in symptoms.   Jessica Case, DPM

## 2016-02-07 ENCOUNTER — Encounter: Payer: Self-pay | Admitting: Podiatry

## 2016-02-07 ENCOUNTER — Ambulatory Visit (INDEPENDENT_AMBULATORY_CARE_PROVIDER_SITE_OTHER): Payer: Medicaid Other

## 2016-02-07 ENCOUNTER — Ambulatory Visit (INDEPENDENT_AMBULATORY_CARE_PROVIDER_SITE_OTHER): Payer: Medicaid Other | Admitting: Podiatry

## 2016-02-07 VITALS — BP 147/88 | HR 80 | Resp 18

## 2016-02-07 DIAGNOSIS — Z9889 Other specified postprocedural states: Secondary | ICD-10-CM

## 2016-02-07 DIAGNOSIS — R609 Edema, unspecified: Secondary | ICD-10-CM

## 2016-02-07 DIAGNOSIS — M2012 Hallux valgus (acquired), left foot: Secondary | ICD-10-CM

## 2016-02-07 MED ORDER — OXYCODONE-ACETAMINOPHEN 10-325 MG PO TABS: 1.0000 | ORAL_TABLET | ORAL | Status: DC | PRN

## 2016-02-09 NOTE — Progress Notes (Signed)
Patient ID: Jessica Case, female   DOB: May 21, 1981, 35 y.o.   MRN: [id number]  Subjective: Jessica Case is a 35 y.o. is seen today in office s/p left [place] bunionectomy. 35 y.o. Since last appointment she has remained weightbearing as tolerated in the cam boot. She has not been using crutches that she has at home. She says the pain has improved all she does get pain on surgical site still. She states the swelling has improved. Denies any systemic complaints such as fevers, chills, nausea, vomiting. No calf pain, chest pain, shortness of breath.   Objective: General: No acute distress, AAOx3  DP/PT pulses palpable 2/4, CRT < 3 sec to all digits.  Protective sensation intact. Motor function intact.  Left foot: Incision is well coapted without any evidence of dehiscence and a scar has formed. There is no surrounding erythema, ascending cellulitis, fluctuance, crepitus, malodor, drainage/purulence. There is decraesed edema around the surgical site.. There is still some discomfort along the surgical site was on the first interspace today. There is no erythema or increase in warmth. There is mild discomfort with first MTPJ range of motion. No other areas of tenderness to bilateral lower extremities.  No other open lesions or pre-ulcerative lesions.  No pain with calf compression, swelling, warmth, erythema.   Assessment and Plan:  Status post left [place] bunionectomy, doing well with no complications   -Treatment options discussed including all alternatives, risks, and complications -X-rays were obtained and reviewed with the patient. There has been some movement across the osteotomy site however screw is present. There appears to be some secondary healing across the osteotomy. I discussed the signs of the patient today. -An Haematologist was applied. Discussed visually increase in swelling, toe discoloration or pain to go ahead and remove the [employer]. Otherwise can remove in 5 days. She states this makes  her foot feel much better when this is applied. Compression stocking once removed.  -I recommended nonweightbearing but she does not like to this. -Pain medication prn -Monitor for any clinical signs or symptoms of infection and DVT/PE and directed to call the office immediately should any occur or go to the ER. -Follow-up in 3 weeks or sooner if any problems arise. In the meantime, encouraged to call the office with any questions, concerns, change in symptoms.   [name], DPM

## 2016-02-28 ENCOUNTER — Encounter: Payer: Self-pay | Admitting: Podiatry

## 2016-02-28 ENCOUNTER — Ambulatory Visit (INDEPENDENT_AMBULATORY_CARE_PROVIDER_SITE_OTHER): Payer: Medicaid Other | Admitting: Podiatry

## 2016-02-28 ENCOUNTER — Ambulatory Visit (INDEPENDENT_AMBULATORY_CARE_PROVIDER_SITE_OTHER): Payer: Medicaid Other

## 2016-02-28 VITALS — BP 114/69 | HR 72 | Resp 12

## 2016-02-28 DIAGNOSIS — M2012 Hallux valgus (acquired), left foot: Secondary | ICD-10-CM

## 2016-02-28 DIAGNOSIS — Z9889 Other specified postprocedural states: Secondary | ICD-10-CM

## 2016-02-28 MED ORDER — OXYCODONE-ACETAMINOPHEN 10-325 MG PO TABS: 1.0000 | ORAL_TABLET | ORAL | Status: DC | PRN

## 2016-03-02 NOTE — Progress Notes (Signed)
Patient ID: Jessica Case, female   DOB: 02/13/1981, 35 y.o.   MRN: [id number]  Subjective: Jessica Case is a 35 y.o. is seen today in office s/p left [place] bunionectomy. She states that she still gets some swelling and discomfort on the big toe joint however it is improving slowly. She is decreased on pain medicine she's been taking states it mostly at night. She is continuing surgical shoe. She is a questionable back into a regular shoe. She states that she has been doing a lot of walking and a CAM boot. Denies any systemic complaints such as fevers, chills, nausea, vomiting. No calf pain, chest pain, shortness of breath.   Objective: General: No acute distress, AAOx3  DP/PT pulses palpable 2/4, CRT < 3 sec to all digits.  Protective sensation intact. Motor function intact.  Left foot: Incision is well coapted without any evidence of dehiscence and a scar has formed. There is no surrounding erythema, ascending cellulitis, fluctuance, crepitus, malodor, drainage/purulence. There is decraesed edema around the surgical site however does continue.  There is still some discomfort along thesurgical site. MPJ range of motion is intact how there is some discomfort with range of motion. No other areas of tenderness bilaterally. No other open lesions or pre-ulcerative lesions.  No pain with calf compression, swelling, warmth, erythema.   Assessment and Plan:  Status post left [place] bunionectomy  -Treatment options discussed including all alternatives, risks, and complications -X-rays were obtained and reviewed with the patient. There has been some movement across the osteotomy site however screw is present. There appears to be some secondary healing across the osteotomy. I discussed the signs of the patient today. -Dispensed a graphite insert to wear in her shoes as well as a compression anklet for swelling. I recommended nonweightbearing but she does not wish to do this. -Range of motion  exercises of the first MPJ. -Pain medication as needed. Refilled today. -I was could order compound cream for her but she does not wish to do this. Continue ibuprofen as well intermittently as needed. -Follow-up as scheduled.  [name], DPM

## 2016-03-05 ENCOUNTER — Ambulatory Visit: Payer: Medicaid Other

## 2016-03-05 ENCOUNTER — Ambulatory Visit: Payer: Medicaid Other | Admitting: Physician Assistant

## 2016-03-23 ENCOUNTER — Encounter: Payer: Self-pay | Admitting: Podiatry

## 2016-03-23 ENCOUNTER — Ambulatory Visit (INDEPENDENT_AMBULATORY_CARE_PROVIDER_SITE_OTHER): Payer: Medicaid Other

## 2016-03-23 ENCOUNTER — Ambulatory Visit (INDEPENDENT_AMBULATORY_CARE_PROVIDER_SITE_OTHER): Payer: Medicaid Other | Admitting: Podiatry

## 2016-03-23 VITALS — BP 152/96 | HR 84 | Resp 18

## 2016-03-23 DIAGNOSIS — M2012 Hallux valgus (acquired), left foot: Secondary | ICD-10-CM

## 2016-03-23 DIAGNOSIS — Z9889 Other specified postprocedural states: Secondary | ICD-10-CM

## 2016-03-23 MED ORDER — OXYCODONE-ACETAMINOPHEN 10-325 MG PO TABS: 1.0000 | ORAL_TABLET | Freq: Four times a day (QID) | ORAL | Status: DC | PRN

## 2016-03-26 NOTE — Progress Notes (Signed)
Patient ID: Jessica Case, female   DOB: [birth year], 35 y.o.   MRN: [id number]  Subjective: Jessica Case is a 35 y.o. is seen today in office s/p left [place] bunionectomy. She states that she's does still get some swelling at night and some discomfort however it has improved. She is asking for 1 more refill the pain medicines today. She is able to wear regular shoe she presents today to flat flip-flop. Denies any systemic complaints such as fevers, chills, nausea, vomiting. No calf pain, chest pain, shortness of breath.   Objective: General: No acute distress, AAOx3  DP/PT pulses palpable 2/4, CRT < 3 sec to all digits.  Protective sensation intact. Motor function intact.  Left foot: Incision is well coapted without any evidence of dehiscence and a scar has formed. The hallux with rectus position. There is mild edema to the first MTPJ however there is no erythema or increase in warmth. There is no pain with MPJ range of motion. There is no other areas of tenderness bilaterally. No other open lesions or pre-ulcerative lesions.  No pain with calf compression, swelling, warmth, erythema.   Assessment and Plan:  Status post left [place] bunionectomy  -Treatment options discussed including all alternatives, risks, and complications -X-rays were obtained and reviewed with the patient. Appears to have a dorsal fracture fragment which is healing. Hardware intact. Callus formation present around the osteotomy. -She is unable to wear flip-flops. I've recommended nonweightbearing in a cam boot however she does not want to do this. I tried a graphite insert last appointment but she did not like this either. Continue with supportive shoe gear as tolerated. Continue ice and elevation as was compression sock. Range of motion exercises the first MPJ. Refilled  pain medicine today. -Follow-up as scheduled or sooner if needed.  [name], DPM

## 2016-03-31 ENCOUNTER — Ambulatory Visit: Payer: Medicaid Other | Admitting: Neurology

## 2016-04-03 ENCOUNTER — Encounter: Payer: Self-pay | Admitting: Family Medicine

## 2016-04-03 ENCOUNTER — Ambulatory Visit: Payer: Medicaid Other | Attending: Family Medicine | Admitting: Family Medicine

## 2016-04-03 VITALS — BP 132/80 | HR 88 | Temp 98.5°F | Resp 16 | Ht 61.0 in | Wt 135.0 lb

## 2016-04-03 DIAGNOSIS — Z79899 Other long term (current) drug therapy: Secondary | ICD-10-CM | POA: Diagnosis not present

## 2016-04-03 DIAGNOSIS — L853 Xerosis cutis: Secondary | ICD-10-CM | POA: Insufficient documentation

## 2016-04-03 DIAGNOSIS — F17210 Nicotine dependence, cigarettes, uncomplicated: Secondary | ICD-10-CM | POA: Diagnosis not present

## 2016-04-03 DIAGNOSIS — Z7982 Long term (current) use of aspirin: Secondary | ICD-10-CM | POA: Diagnosis not present

## 2016-04-03 MED ORDER — AMMONIUM LACTATE 12 % EX CREA: TOPICAL_CREAM | CUTANEOUS | Status: DC | PRN

## 2016-04-03 NOTE — Progress Notes (Signed)
Subjective:  Patient ID: Jessica Case, female    DOB: 08-06-1981  Age: 35 y.o. MRN: [id number]  CC: Referral   HPI Mily Peeples presents for   1. Requesting derm referral: for dry skin. Dry skin and itching on legs. No rash. She uses OTC moisturizer. Skin has been dry for past 3 months or so.  Social History  Substance Use Topics  . Smoking status: Current Every Day Smoker -- 0.10 packs/day    Types: Cigarettes  . Smokeless tobacco: Never Used  . Alcohol Use: 0.0 oz/week    0 Standard drinks or equivalent per week     Comment: occ beer     Outpatient Prescriptions Prior to Visit  Medication Sig Dispense Refill  . Aspirin-Salicylamide-Caffeine (BC HEADACHE POWDER PO) Take 1 packet by mouth daily as needed (headache/migraine).    . benzonatate (TESSALON) 100 MG capsule Take 1 capsule (100 mg total) by mouth every 8 (eight) hours. 21 capsule 0  . fluticasone (FLONASE) 50 MCG/ACT nasal spray Place 2 sprays into both nostrils daily. 16 g 6  . meloxicam (MOBIC) 7.5 MG tablet Take 1 tablet (7.5 mg total) by mouth daily as needed for pain. 10 tablet 0  . oxyCODONE-acetaminophen (PERCOCET) 10-325 MG tablet Take 1 tablet by mouth every 6 (six) hours as needed for pain. 25 tablet 0  . traZODone (DESYREL) 150 MG tablet Take 150 mg by mouth at bedtime as needed for sleep.     . ziprasidone (GEODON) 40 MG capsule Take 40 mg by mouth 2 (two) times daily with a meal.    . promethazine (PHENERGAN) 25 MG tablet Take 25 mg by mouth every 8 (eight) hours as needed for nausea or vomiting. Reported on 04/03/2016    . cephALEXin (KEFLEX) 500 MG capsule Take 1 capsule (500 mg total) by mouth 3 (three) times daily. (Patient not taking: Reported on 04/03/2016) 21 capsule 0  . loratadine (CLARITIN) 10 MG tablet Take 1 tablet (10 mg total) by mouth daily. (Patient not taking: Reported on 04/03/2016) 30 tablet 3  . ondansetron (ZOFRAN) 4 MG tablet Take 1 tablet (4 mg total) by mouth every 6 (six) hours.  (Patient not taking: Reported on 04/03/2016) 12 tablet 0  . oseltamivir (TAMIFLU) 75 MG capsule Take 1 capsule (75 mg total) by mouth every 12 (twelve) hours. (Patient not taking: Reported on 04/03/2016) 10 capsule 0  . propranolol (INDERAL) 40 MG tablet Take 1 tablet (40 mg total) by mouth 2 (two) times daily. (Patient not taking: Reported on 04/03/2016) 60 tablet 0  . SUMAtriptan (IMITREX) 100 MG tablet Take 1tablet at earliest onset of headache.  May repeat x1 in 2 hours if headache persists or recurs.  Do not exceed 2 tablets in 24 hours (Patient not taking: Reported on 04/03/2016) 10 tablet 2   No facility-administered medications prior to visit.    ROS Review of Systems  Constitutional: Negative for fever and chills.  Eyes: Negative for visual disturbance.  Respiratory: Negative for shortness of breath.   Cardiovascular: Negative for chest pain.  Gastrointestinal: Negative for abdominal pain and blood in stool.  Musculoskeletal: Negative for back pain and arthralgias.  Skin: Negative for rash.  Allergic/Immunologic: Negative for immunocompromised state.  Hematological: Negative for adenopathy. Does not bruise/bleed easily.  Psychiatric/Behavioral: Negative for suicidal ideas and dysphoric mood.    Objective:  BP 132/80 mmHg  Pulse 88  Temp(Src) 98.5 F (36.9 C) (Oral)  Resp 16  Ht 5\' 1"  (1.549 m)  Wt 135  lb (61.236 kg)  BMI 25.52 kg/m2  SpO2 99%  LMP 03/21/2016  BP/Weight 04/03/2016 03/23/2016 [id number]  Systolic BP [id number]  Diastolic BP 80 96 69  Wt. (Lbs) 135 - -  BMI 25.52 - -   Physical Exam  Constitutional: She is oriented to person, place, and time. She appears well-developed and well-nourished. No distress.  HENT:  Head: Normocephalic and atraumatic.  Cardiovascular: Normal rate, regular rhythm, normal heart sounds and intact distal pulses.   Pulmonary/Chest: Effort normal and breath sounds normal.  Musculoskeletal: She exhibits no edema.  Neurological: She is  alert and oriented to person, place, and time.  Skin: Skin is warm and dry. No rash noted.  Xerotic skin on legs   Psychiatric: She has a normal mood and affect.     Assessment & Plan:   There are no diagnoses linked to this encounter. Yilin was seen today for referral.  Diagnoses and all orders for this visit:  Xerosis of skin -     ammonium lactate (LAC-HYDRIN) 12 % cream; Apply topically as needed for dry skin.   Meds ordered this encounter  Medications  . ammonium lactate (LAC-HYDRIN) 12 % cream    Sig: Apply topically as needed for dry skin.    Dispense:  385 g    Refill:  0    Follow-up: No Follow-up on file.   Boykin Nearing MD

## 2016-04-03 NOTE — Progress Notes (Signed)
Requesting referral for Dermatology  Legs and arm itching with spots  No pain today  Tobacco user 1 cigar every other day  No suicidal thoughts in the past two weeks

## 2016-04-03 NOTE — Patient Instructions (Addendum)
Jessica Case was seen today for referral.  Diagnoses and all orders for this visit:  Xerosis of skin -     ammonium lactate (LAC-HYDRIN) 12 % cream; Apply topically as needed for dry skin.    Call in 4 weeks if still itching I will place derm referral  F/u in 3 months  Dr. Adrian Blackwater

## 2016-04-05 ENCOUNTER — Emergency Department (HOSPITAL_COMMUNITY)
Admission: EM | Admit: 2016-04-05 | Discharge: 2016-04-05 | Discharge status: Against Medical Advice | Payer: Medicaid Other | Attending: Emergency Medicine | Admitting: Emergency Medicine

## 2016-04-05 ENCOUNTER — Emergency Department (HOSPITAL_COMMUNITY): Payer: Medicaid Other

## 2016-04-05 ENCOUNTER — Encounter (HOSPITAL_COMMUNITY): Payer: Self-pay

## 2016-04-05 DIAGNOSIS — R1032 Left lower quadrant pain: Secondary | ICD-10-CM | POA: Diagnosis not present

## 2016-04-05 DIAGNOSIS — Z79899 Other long term (current) drug therapy: Secondary | ICD-10-CM | POA: Insufficient documentation

## 2016-04-05 DIAGNOSIS — F200 Paranoid schizophrenia: Secondary | ICD-10-CM | POA: Diagnosis not present

## 2016-04-05 DIAGNOSIS — F17210 Nicotine dependence, cigarettes, uncomplicated: Secondary | ICD-10-CM | POA: Insufficient documentation

## 2016-04-05 DIAGNOSIS — F40240 Claustrophobia: Secondary | ICD-10-CM | POA: Diagnosis not present

## 2016-04-05 LAB — COMPREHENSIVE METABOLIC PANEL
ALBUMIN: 4.1 g/dL (ref 3.5–5.0)
ALT: 19 U/L (ref 14–54)
AST: 26 U/L (ref 15–41)
Alkaline Phosphatase: 58 U/L (ref 38–126)
Anion gap: 8 (ref 5–15)
BILIRUBIN TOTAL: 0.7 mg/dL (ref 0.3–1.2)
BUN: 8 mg/dL (ref 6–20)
CHLORIDE: 108 mmol/L (ref 101–111)
CO2: 22 mmol/L (ref 22–32)
CREATININE: 0.77 mg/dL (ref 0.44–1.00)
Calcium: 8.7 mg/dL — ABNORMAL LOW (ref 8.9–10.3)
GFR calc Af Amer: >60 mL/min (ref >60)
GLUCOSE: 84 mg/dL (ref 65–99)
Potassium: 3.9 mmol/L (ref 3.5–5.1)
Sodium: 138 mmol/L (ref 135–145)
Total Protein: 6.6 g/dL (ref 6.5–8.1)

## 2016-04-05 LAB — URINALYSIS, ROUTINE W REFLEX MICROSCOPIC
Bilirubin Urine: NEGATIVE
Glucose, UA: NEGATIVE mg/dL
Ketones, ur: NEGATIVE mg/dL
Nitrite: NEGATIVE
Protein, ur: NEGATIVE mg/dL
Specific Gravity, Urine: 1.004 — ABNORMAL LOW (ref 1.005–1.030)
pH: 5.5 (ref 5.0–8.0)

## 2016-04-05 LAB — CBC
HCT: 37.7 % (ref 36.0–46.0)
Hemoglobin: 12.8 g/dL (ref 12.0–15.0)
MCH: 29.7 pg (ref 26.0–34.0)
MCHC: 34 g/dL (ref 30.0–36.0)
MCV: 87.5 fL (ref 78.0–100.0)
Platelets: 309 10*3/uL (ref 150–400)
RBC: 4.31 MIL/uL (ref 3.87–5.11)
RDW: 13.8 % (ref 11.5–15.5)
WBC: 9.7 10*3/uL (ref 4.0–10.5)

## 2016-04-05 LAB — URINE MICROSCOPIC-ADD ON
Bacteria, UA: NONE SEEN
RBC / HPF: NONE SEEN RBC/hpf (ref 0–5)

## 2016-04-05 LAB — POC URINE PREG, ED: Preg Test, Ur: NEGATIVE

## 2016-04-05 LAB — LIPASE, BLOOD: Lipase: 29 U/L (ref 11–51)

## 2016-04-05 MED ORDER — MORPHINE SULFATE (PF) 4 MG/ML IV SOLN
4.0000 mg | Freq: Once | INTRAVENOUS | Status: AC
  Administered 2016-04-05: 4 mg via INTRAVENOUS
  Filled 2016-04-05: qty 1

## 2016-04-05 NOTE — ED Notes (Signed)
Pt "I done told yall 50 times what was wrong" "I've got my pain medicine - I'm gonna go to my OBGYN"

## 2016-04-05 NOTE — ED Notes (Signed)
Patient pulled out her  IV and left against medical advise.

## 2016-04-05 NOTE — ED Notes (Signed)
Pt presents with c/o abdominal pain that started yesterday. Pt reports she "has fluid in her fallopian tubes". Pt reports that when this happens she receives antibiotics and pain pills and that this time, she wants her fallopian tubes taken out.

## 2016-04-05 NOTE — ED Provider Notes (Signed)
CSN: [id number]     Arrival date & time 04/05/16  1612 History   First MD Initiated Contact with Patient 04/05/16 1708     Chief Complaint  Patient presents with  . Abdominal Pain     (Consider location/radiation/quality/duration/timing/severity/associated sxs/prior Treatment) HPI Jessica Case is a 35 y.o. female with a history of hydrosalpinx, fallopian tube abscess, here for evaluation of abdominal pain. Patient reports ongoing, chronic left lower abdominal pain over the past "for 5 years", worse over the past 1-2 days. Characterized as sharp and stabbing. She had been taking her husband's oxycodone without relief. Reports it feels better when she pushes in on her left lower abdomen, worse when she lets go. She denies any fevers, chills, nausea or vomiting, urinary symptoms, diarrhea or constipation, unusual vaginal bleeding or discharge. States that she is currently on her menstrual cycle and it is normal for her. States she is followed by [employer] and wellness. Reports that she is here today to "have my fallopian tubes taken out". No other modifying factors.  Past Medical History  Diagnosis Date  . Fallopian tube abscess   . Paranoid schizophrenia ([place])   . Claustrophobia    Past Surgical History  Procedure Laterality Date  . Tooth extraction     Family History  Problem Relation Age of Onset  . Hypertension Mother    Social History  Substance Use Topics  . Smoking status: Current Every Day Smoker -- 0.10 packs/day    Types: Cigarettes  . Smokeless tobacco: Never Used  . Alcohol Use: 0.0 oz/week    0 Standard drinks or equivalent per week     Comment: occ beer    OB History    Gravida Para Term Preterm AB TAB SAB Ectopic Multiple Living   2 1 0 1 1 0 1   0     Review of Systems A 10 point review of systems was completed and was negative except for pertinent positives and negatives as mentioned in the history of present illness     Allergies  Citrus;  Strawberry extract; and Toradol  Home Medications   Prior to Admission medications   Medication Sig Start Date End Date Taking? Authorizing Provider  oxyCODONE-acetaminophen (PERCOCET) 10-325 MG tablet Take 1 tablet by mouth every 6 (six) hours as needed for pain. 03/23/16  Yes Trula Slade, DPM  traZODone (DESYREL) 150 MG tablet Take 150 mg by mouth at bedtime as needed for sleep.    Yes Historical Provider, MD  ziprasidone (GEODON) 40 MG capsule Take 40 mg by mouth 2 (two) times daily with a meal.   Yes Historical Provider, MD  ammonium lactate (LAC-HYDRIN) 12 % cream Apply topically as needed for dry skin. 04/03/16   Josalyn Funches, MD  benzonatate (TESSALON) 100 MG capsule Take 1 capsule (100 mg total) by mouth every 8 (eight) hours. 12/17/15   Orpah Greek, MD  fluticasone (FLONASE) 50 MCG/ACT nasal spray Place 2 sprays into both nostrils daily. Patient not taking: Reported on 04/05/2016 07/18/15   Boykin Nearing, MD  meloxicam (MOBIC) 7.5 MG tablet Take 1 tablet (7.5 mg total) by mouth daily as needed for pain. Patient not taking: Reported on 04/05/2016 01/06/16   Clayton Bibles, PA-C  promethazine (PHENERGAN) 25 MG tablet Take 25 mg by mouth every 8 (eight) hours as needed for nausea or vomiting. Reported on 04/03/2016    Trula Slade, DPM   BP 147/94 mmHg  Pulse 94  Temp(Src) 98.4 F (36.9 C) (  Oral)  Resp 18  SpO2 97%  LMP 04/05/2016 Physical Exam  Constitutional: She is oriented to person, place, and time. She appears well-developed and well-nourished.  HENT:  Head: Normocephalic and atraumatic.  Mouth/Throat: Oropharynx is clear and moist.  Eyes: Conjunctivae are normal. Pupils are equal, round, and reactive to light. Right eye exhibits no discharge. Left eye exhibits no discharge. No scleral icterus.  Neck: Neck supple.  Cardiovascular: Normal rate, regular rhythm and normal heart sounds.   Pulmonary/Chest: Effort normal and breath sounds normal. No respiratory  distress. She has no wheezes. She has no rales.  Abdominal: Soft.  Mild tenderness to palpation in suprapubic region. Some rebound tenderness in left lower quadrant, mild. No other distention, mass or abnormalities noted  Musculoskeletal: She exhibits no tenderness.  Neurological: She is alert and oriented to person, place, and time.  Cranial Nerves II-XII grossly intact  Skin: Skin is warm and dry. No rash noted.  Psychiatric: She has a normal mood and affect.  Nursing note and vitals reviewed.   ED Course  Procedures (including critical care time) Labs Review Labs Reviewed  COMPREHENSIVE METABOLIC PANEL - Abnormal; Notable for the following:    Calcium 8.7 (*)    All other components within normal limits  URINALYSIS, ROUTINE W REFLEX MICROSCOPIC (NOT AT [facility]) - Abnormal; Notable for the following:    Specific Gravity, Urine 1.004 (*)    Hgb urine dipstick LARGE (*)    Leukocytes, UA TRACE (*)    All other components within normal limits  URINE MICROSCOPIC-ADD ON - Abnormal; Notable for the following:    Squamous Epithelial / LPF 0-5 (*)    All other components within normal limits  LIPASE, BLOOD  CBC  POC URINE PREG, ED    Imaging Review No results found. I have personally reviewed and evaluated these images and lab results as part of my medical decision-making.   EKG Interpretation None      MDM  Patient with remote history of hydrosalpinx presents for evaluation of left lower quadrant abdominal pain. On arrival she is hemodynamically stable and afebrile. Mild tenderness in left lower quadrant and suprapubic region. Will obtain screening labs, pelvic ultrasound for further evaluation of patient's pain. Per nursing, patient removes her IV and leaves AGAINST MEDICAL ADVICE. Final diagnoses:  LLQ pain       Comer Locket, PA-C 04/05/16 [place], MD 04/05/16 [phone]

## 2016-04-06 DIAGNOSIS — L853 Xerosis cutis: Secondary | ICD-10-CM | POA: Insufficient documentation

## 2016-04-06 NOTE — Assessment & Plan Note (Signed)
Xerosis of skin Lac hydrin ordered

## 2016-04-07 ENCOUNTER — Ambulatory Visit: Payer: Medicaid Other | Attending: Family Medicine | Admitting: Family Medicine

## 2016-04-07 ENCOUNTER — Encounter: Payer: Self-pay | Admitting: Family Medicine

## 2016-04-07 VITALS — BP 133/89 | HR 67 | Temp 99.2°F | Resp 16 | Ht 61.0 in | Wt 131.0 lb

## 2016-04-07 DIAGNOSIS — F200 Paranoid schizophrenia: Secondary | ICD-10-CM | POA: Insufficient documentation

## 2016-04-07 DIAGNOSIS — F17200 Nicotine dependence, unspecified, uncomplicated: Secondary | ICD-10-CM | POA: Insufficient documentation

## 2016-04-07 DIAGNOSIS — Z79891 Long term (current) use of opiate analgesic: Secondary | ICD-10-CM | POA: Insufficient documentation

## 2016-04-07 DIAGNOSIS — R102 Pelvic and perineal pain: Secondary | ICD-10-CM | POA: Insufficient documentation

## 2016-04-07 DIAGNOSIS — Z79899 Other long term (current) drug therapy: Secondary | ICD-10-CM | POA: Insufficient documentation

## 2016-04-07 DIAGNOSIS — R1032 Left lower quadrant pain: Secondary | ICD-10-CM | POA: Insufficient documentation

## 2016-04-07 NOTE — Progress Notes (Signed)
Referral to GYN  Hx fluids in fallopian tubes  No pain today  Tobacco user 1 cigar every other day  No suicidal thoughts in the past two weeks

## 2016-04-07 NOTE — Assessment & Plan Note (Signed)
Could be pain from the fallopian tube or menstrual pain.  Ultrasound ordered Recommend ibuprofen or tylenol for pain. I will review your [place] and refer to Gyn if it is abnormal.  Present to [facility] at [facility]  if you develop fever or severe pain  F/u in 4 weeks for left lower abdominal pa

## 2016-04-07 NOTE — Progress Notes (Signed)
Subjective:  Patient ID: Jessica Case, female    DOB: Apr 05, 1981  Age: 35 y.o. MRN: [id number]  CC: Referral   HPI Jessica Case presents for    1. LLQ pain: pain started on 04/04/16 and worsened on 04/05/17. Pain in LLQ described as pressure. Some constipation. No fever or chills. No nausea or vomiting. No vaginal discharge. She is currently on her menstrual period. She feels like the pain is the same as she had when she had L hydrosalpinx and tuboovarian abscess in 06/2014. She request a referral back to gynecology. She would like to have her L fallopian tube removed. She denies dysuria, flank pain and hip pain.   In ED on 04/05/16: UA with large Hgb (she reports that she was on her menstrual period) and trace LE, U preg negative. CBC, CMP, lipase normal. IV placed and patient treated with normal saline and 4 mg of morphine sulfate x 1. She pulled out of IV and left AMA before a pelvic US could be completed.   She states that she left because she was hungry and the care was taking too long.   Past Medical History  Diagnosis Date  . Fallopian tube abscess   . Paranoid schizophrenia ([place])   . Claustrophobia     Social History  Substance Use Topics  . Smoking status: Current Every Day Smoker -- 0.10 packs/day    Types: Cigarettes  . Smokeless tobacco: Never Used  . Alcohol Use: 0.0 oz/week    0 Standard drinks or equivalent per week     Comment: occ beer     Outpatient Prescriptions Prior to Visit  Medication Sig Dispense Refill  . ammonium lactate (LAC-HYDRIN) 12 % cream Apply topically as needed for dry skin. 385 g 0  . benzonatate (TESSALON) 100 MG capsule Take 1 capsule (100 mg total) by mouth every 8 (eight) hours. 21 capsule 0  . fluticasone (FLONASE) 50 MCG/ACT nasal spray Place 2 sprays into both nostrils daily. 16 g 6  . meloxicam (MOBIC) 7.5 MG tablet Take 1 tablet (7.5 mg total) by mouth daily as needed for pain. 10 tablet 0  . oxyCODONE-acetaminophen  (PERCOCET) 10-325 MG tablet Take 1 tablet by mouth every 6 (six) hours as needed for pain. 25 tablet 0  . promethazine (PHENERGAN) 25 MG tablet Take 25 mg by mouth every 8 (eight) hours as needed for nausea or vomiting. Reported on 04/03/2016    . traZODone (DESYREL) 150 MG tablet Take 150 mg by mouth at bedtime as needed for sleep.     . ziprasidone (GEODON) 40 MG capsule Take 40 mg by mouth 2 (two) times daily with a meal.     No facility-administered medications prior to visit.    ROS Review of Systems  Constitutional: Negative for fever and chills.  Eyes: Negative for visual disturbance.  Respiratory: Negative for shortness of breath.   Cardiovascular: Negative for chest pain.  Gastrointestinal: Positive for constipation and abdominal distention. Negative for abdominal pain and blood in stool.  Musculoskeletal: Negative for back pain and arthralgias.  Skin: Negative for rash.  Allergic/Immunologic: Negative for immunocompromised state.  Hematological: Negative for adenopathy. Does not bruise/bleed easily.  Psychiatric/Behavioral: Negative for suicidal ideas and dysphoric mood.    Objective:  BP 133/89 mmHg  Pulse 67  Temp(Src) 99.2 F (37.3 C) (Oral)  Resp 16  Ht 5\' 1"  (1.549 m)  Wt 131 lb (59.421 kg)  BMI 24.76 kg/m2  SpO2 99%  LMP 04/05/2016  BP/Weight 04/07/2016  04/05/2016 [id number]  Systolic BP Q000111Q Q000111Q Q000111Q  Diastolic BP 89 94 80  Wt. (Lbs) 131 - 135  BMI 24.76 - 25.52   Physical Exam  Constitutional: She is oriented to person, place, and time. She appears well-developed and well-nourished. No distress.  HENT:  Head: Normocephalic and atraumatic.  Cardiovascular: Normal rate, regular rhythm, normal heart sounds and intact distal pulses.   Pulmonary/Chest: Effort normal and breath sounds normal.  Abdominal: Soft. Bowel sounds are normal. She exhibits no distension and no mass. There is tenderness in the left lower quadrant. There is no rebound, no guarding and no CVA  tenderness.  Musculoskeletal: She exhibits no edema.       Left hip: Normal.  Neurological: She is alert and oriented to person, place, and time.  Skin: Skin is warm and dry. No rash noted.  Psychiatric: She has a normal mood and affect.     Assessment & Plan:   Tommy was seen today for referral.  Diagnoses and all orders for this visit:  LLQ abdominal pain -     Cancel: POCT urinalysis dipstick -     US Pelvis Complete; Future -     US Transvaginal Non-OB; Future   There are no diagnoses linked to this encounter.  No orders of the defined types were placed in this encounter.    Follow-up: No Follow-up on file.   Boykin Nearing MD

## 2016-04-07 NOTE — Patient Instructions (Addendum)
Jessica Case was seen today for referral.  Diagnoses and all orders for this visit:  LLQ abdominal pain -     Cancel: POCT urinalysis dipstick -     US Pelvis Complete; Future -     US Transvaginal Non-OB; Future   Could be pain from the fallopian tube or menstrual pain.  Ultrasound ordered Recommend ibuprofen or tylenol for pain. I will review your [place] and refer to Gyn if it is abnormal.  Present to [facility]U at [facility]  if you develop fever or severe pain  F/u in 4 weeks for left lower abdominal pain   Dr. Adrian Blackwater

## 2016-04-13 ENCOUNTER — Ambulatory Visit (HOSPITAL_COMMUNITY)
Admission: RE | Admit: 2016-04-13 | Discharge: 2016-04-13 | Disposition: A | Discharge status: Home / Self Care | Payer: Medicaid Other | Source: Ambulatory Visit | Attending: Family Medicine | Admitting: Family Medicine

## 2016-04-13 ENCOUNTER — Telehealth: Payer: Self-pay | Admitting: Family Medicine

## 2016-04-13 ENCOUNTER — Other Ambulatory Visit: Payer: Self-pay | Admitting: Family Medicine

## 2016-04-13 DIAGNOSIS — N7011 Chronic salpingitis: Secondary | ICD-10-CM

## 2016-04-13 DIAGNOSIS — R1032 Left lower quadrant pain: Secondary | ICD-10-CM | POA: Insufficient documentation

## 2016-04-13 DIAGNOSIS — R102 Pelvic and perineal pain: Secondary | ICD-10-CM

## 2016-04-13 NOTE — Telephone Encounter (Signed)
-----   Message from Boykin Nearing, MD sent at 04/13/2016  2:15 PM EDT ----- Pelvic and transvaginal US with persistent L hydrosalpinx (fluid and edema in the fallopian tube)  Plan for Gyn referral since patient has pain

## 2016-04-13 NOTE — Telephone Encounter (Signed)
Called pt. Individual by the name of Doug answered and stated pt is currently at a doctors appt. Left message for pt to return call at [phone].

## 2016-04-14 NOTE — Telephone Encounter (Signed)
Patient called returning nurse's call to review results. Please f/up

## 2016-04-14 NOTE — Telephone Encounter (Signed)
Called pt. Pt verified name and date of birth. Pt notified that her pelvic and transvaginal ultrasound showed persisent fluid and edema in her L fallopian tube. Pt notified that a referral was placed to gynecology since she has pain and someone will contact her in reference to the referral. Pt asked what is edema. Clarified that edema is swelling. Pt then asked when should she hear from someone in reference to the referral. Pt notified that is she doesn't hear from someone within a week, to give [place] a call back. Pt voiced understanding.

## 2016-04-27 ENCOUNTER — Telehealth: Payer: Self-pay | Admitting: *Deleted

## 2016-04-27 MED ORDER — HYDROCODONE-ACETAMINOPHEN 5-325 MG PO TABS: 1.0000 | ORAL_TABLET | Freq: Four times a day (QID) | ORAL | Status: DC | PRN

## 2016-04-27 NOTE — Telephone Encounter (Signed)
Entered in error

## 2016-04-27 NOTE — Telephone Encounter (Addendum)
Pt presents to office for refill of her pain medication, Oxycodone.  Dr. Jacqualyn Posey ordered Hydrocodone 5/325M #30 one tablet every 6 hours.

## 2016-05-04 ENCOUNTER — Ambulatory Visit (INDEPENDENT_AMBULATORY_CARE_PROVIDER_SITE_OTHER): Payer: Medicaid Other | Admitting: Podiatry

## 2016-05-04 ENCOUNTER — Ambulatory Visit (INDEPENDENT_AMBULATORY_CARE_PROVIDER_SITE_OTHER): Payer: Medicaid Other

## 2016-05-04 DIAGNOSIS — M2012 Hallux valgus (acquired), left foot: Secondary | ICD-10-CM | POA: Diagnosis not present

## 2016-05-04 NOTE — Progress Notes (Signed)
Patient ID: Jessica Case, female   DOB: 07-27-1981, 35 y.o.   MRN: [id number]  Subjective: Jessica Case is a 35 y.o. is seen today in office s/p left [place] bunionectomy. She did come get a refill of her pain medicine last week or she states that she has not been taking that she's been doing very well and she has been back into his shoe doing regular activities without any pain. She states her swelling is also almost completely resolved. She is doing much better. Denies any systemic complaints such as fevers, chills, nausea, vomiting. No calf pain, chest pain, shortness of breath.   Objective: General: No acute distress, AAOx3  DP/PT pulses palpable 2/4, CRT < 3 sec to all digits.  Protective sensation intact. Motor function intact.  Left foot: Incision is well coapted without any evidence of dehiscence and a scar has formed. The hallux with rectus position. There is faint edema to the surgical site and is much improved. There is no pain with MPJ range of motion. No restriction of ROM. There is no other areas of tenderness bilaterally. No other open lesions or pre-ulcerative lesions.  No pain with calf compression, swelling, warmth, erythema.   Assessment and Plan:  Status post left [place] bunionectomy  -Treatment options discussed including all alternatives, risks, and complications -X-rays were obtained and reviewed with the patient. Hardware intact. Increased consolidation across the osteotomy.  -At this time continue with supportive shoe gear. Continue range of motion exercises of the MPJ have there is no restriction today. -Continue ice and elevation and compression as needed -At this point she is doing well from the surgery having no pain. I'll discharge her from the surgery. She is any questions or concerns or any change in symptoms to call the office. Follow-up as needed.   [name], DPM

## 2016-05-13 ENCOUNTER — Ambulatory Visit (INDEPENDENT_AMBULATORY_CARE_PROVIDER_SITE_OTHER): Payer: Medicaid Other | Admitting: Family Medicine

## 2016-05-13 ENCOUNTER — Encounter: Payer: Self-pay | Admitting: Family Medicine

## 2016-05-13 VITALS — BP 115/84 | HR 96 | Ht 61.0 in | Wt 137.0 lb

## 2016-05-13 DIAGNOSIS — N7011 Chronic salpingitis: Secondary | ICD-10-CM | POA: Diagnosis not present

## 2016-05-13 NOTE — Progress Notes (Signed)
   Subjective:    Patient ID: Jessica Case, female    DOB: 02/27/1981, 35 y.o.   MRN: [id number]  HPI Patient seen for pelvic pain that has been intermittent for more than five years.  She was hospitalized 2 years ago for a TOA and hydrosalpinx and treated successfully.  Has continued hydrosalpinx.   Not currently on medications.  Would like surgery to have this removed.  I have reviewed the patients past medical, family, and social history.  I have reviewed the patient's medication list and allergies.   Review of Systems     Objective:   Physical Exam  Constitutional: She is oriented to person, place, and time. She appears well-developed and well-nourished.  HENT:  Head: Normocephalic and atraumatic.  Right Ear: External ear normal.  Left Ear: External ear normal.  Cardiovascular: Normal rate, regular rhythm, normal heart sounds and intact distal pulses.  Exam reveals no gallop and no friction rub.   No murmur heard. Pulmonary/Chest: Effort normal. No respiratory distress. She has no wheezes. She has no rales. She exhibits no tenderness.  Abdominal: Soft. Bowel sounds are normal. She exhibits no distension and no mass. There is tenderness (LLQ tenderness). There is no rebound and no guarding.  Neurological: She is alert and oriented to person, place, and time.  Skin: Skin is warm and dry. No rash noted. No erythema. No pallor.  Psychiatric: She has a normal mood and affect. Her behavior is normal. Judgment and thought content normal.      Assessment & Plan:   1. Hydrosalpinx Will arrange for pt to see gyn surgeon for surgical removal.

## 2016-05-15 ENCOUNTER — Inpatient Hospital Stay (HOSPITAL_COMMUNITY)
Admission: AD | Admit: 2016-05-15 | Discharge: 2016-05-15 | Disposition: A | Discharge status: Home / Self Care | Payer: Medicaid Other | Source: Ambulatory Visit | Attending: Obstetrics & Gynecology | Admitting: Obstetrics & Gynecology

## 2016-05-15 ENCOUNTER — Encounter (HOSPITAL_COMMUNITY): Payer: Self-pay | Admitting: *Deleted

## 2016-05-15 ENCOUNTER — Other Ambulatory Visit: Payer: Self-pay | Admitting: Student

## 2016-05-15 DIAGNOSIS — N7091 Salpingitis, unspecified: Secondary | ICD-10-CM | POA: Insufficient documentation

## 2016-05-15 DIAGNOSIS — F17210 Nicotine dependence, cigarettes, uncomplicated: Secondary | ICD-10-CM | POA: Insufficient documentation

## 2016-05-15 DIAGNOSIS — R51 Headache: Secondary | ICD-10-CM | POA: Diagnosis not present

## 2016-05-15 DIAGNOSIS — A5901 Trichomonal vulvovaginitis: Secondary | ICD-10-CM

## 2016-05-15 DIAGNOSIS — A599 Trichomoniasis, unspecified: Secondary | ICD-10-CM

## 2016-05-15 DIAGNOSIS — R102 Pelvic and perineal pain: Secondary | ICD-10-CM

## 2016-05-15 DIAGNOSIS — D259 Leiomyoma of uterus, unspecified: Secondary | ICD-10-CM | POA: Insufficient documentation

## 2016-05-15 DIAGNOSIS — F200 Paranoid schizophrenia: Secondary | ICD-10-CM | POA: Insufficient documentation

## 2016-05-15 HISTORY — DX: Benign neoplasm of connective and other soft tissue, unspecified: D21.9

## 2016-05-15 LAB — URINALYSIS, ROUTINE W REFLEX MICROSCOPIC
Bilirubin Urine: NEGATIVE
Glucose, UA: NEGATIVE mg/dL
Hgb urine dipstick: NEGATIVE
Ketones, ur: NEGATIVE mg/dL
LEUKOCYTES UA: NEGATIVE
NITRITE: NEGATIVE
PROTEIN: NEGATIVE mg/dL
Specific Gravity, Urine: 1.01 (ref 1.005–1.030)
pH: 5.5 (ref 5.0–8.0)

## 2016-05-15 LAB — WET PREP, GENITAL
Sperm: NONE SEEN
YEAST WET PREP: NONE SEEN

## 2016-05-15 LAB — POCT PREGNANCY, URINE: Preg Test, Ur: NEGATIVE

## 2016-05-15 MED ORDER — METRONIDAZOLE 500 MG PO TABS
2000.0000 mg | ORAL_TABLET | Freq: Once | ORAL | Status: AC
  Administered 2016-05-15: 2000 mg via ORAL
  Filled 2016-05-15: qty 4

## 2016-05-15 MED ORDER — IBUPROFEN 800 MG PO TABS: 800.0000 mg | ORAL_TABLET | Freq: Three times a day (TID) | ORAL | 0 refills | Status: DC | PRN

## 2016-05-15 MED ORDER — MORPHINE SULFATE (PF) 4 MG/ML IV SOLN
2.0000 mg | Freq: Once | INTRAVENOUS | Status: AC
  Administered 2016-05-15: 2 mg via INTRAMUSCULAR
  Filled 2016-05-15: qty 1

## 2016-05-15 NOTE — MAU Note (Signed)
Has fluid in fallopian tube. Now having pain RLQ.  Started this morning.  Took  2 Ibuprofen this morning, not helping. Pain is progressing

## 2016-05-15 NOTE — Discharge Instructions (Signed)
Expedited Partner Therapy:  °Information Sheet for Patients and Partners  °            ° °You have been offered expedited partner therapy (EPT). This information sheet contains important information and warnings you need to be aware of, so please read it carefully.  ° °Expedited Partner Therapy (EPT) is the clinical practice of treating the sexual partners of persons who receive chlamydia, gonorrhea, or trichomoniasis diagnoses by providing medications or prescriptions to the patient. Patients then provide partners with these therapies without the health-care provider having examined the partner. In other words, EPT is a convenient, fast and private way for patients to help their sexual partners get treated.  ° °Chlamydia and gonorrhea are bacterial infections you get from having sex with a person who is already infected. Trichomoniasis (or “trich”) is a very common sexually transmitted infection (STI) that is caused by infection with a protozoan parasite called Trichomonas vaginalis.  Many people with these infections don’t know it because they feel fine, but without treatment these infections can cause serious health problems, such as pelvic inflammatory disease, ectopic pregnancy, infertility and increased risk of HIV.  ° °It is important to get treated as soon as possible to protect your health, to avoid spreading these infections to others, and to prevent yourself from becoming re-infected. The good news is these infections can be easily cured with proper antibiotic medicine. The best way to take care of your self is to see a doctor or go to your local health department. If you are not able to see a doctor or other medical provider, you should take EPT.  ° ° °Recommended Medication: °EPT for Chlamydia:  Azithromycin (Zithromax) 1 gram orally in a single dose °EPT for Gonorrhea:  Cefixime (Suprax) 400 milligrams orally in a single dose PLUS azithromycin (Zithromax) 1 gram orally in a single dose °EPT for  Trichomoniasis:  Metronidazole (Flagyl) 2 grams orally in a single dose ° ° °These medicines are very safe. However, you should not take them if you have ever had an allergic reaction (like a rash) to any of these medicines: azithromycin (Zithromax), erythromycin, clarithromycin (Biaxin), metronidazole (Flagyl), tinidazole (Tindimax). If you are uncertain about whether you have an allergy, call your medical provider or pharmacist before taking this medicine. If you have a serious, long-term illness like kidney, liver or heart disease, colitis or stomach problems, or you are currently taking other prescription medication, talk to your provider before taking this medication.  ° °Women: If you have lower belly pain, pain during sex, vomiting, or a fever, do not take this medicine. Instead, you should see a medical provider to be certain you do not have pelvic inflammatory disease (PID). PID can be serious and lead to infertility, pregnancy problems or chronic pelvic pain.  ° °Pregnant Women: It is very important for you to see a doctor to get pregnancy services and pre-natal care. These antibiotics for EPT are safe for pregnant women, but you still need to see a medical provider as soon as possible. It is also important to note that Doxycycline is an alternative therapy for chlamydia, but it should not be taken by someone who is pregnant.  ° °Men: If you have pain or swelling in the testicles or a fever, do not take this medicine and see a medical provider.    ° °Men who have sex with men (MSM): MSM in [place] continue to experience high rates of syphilis and HIV. Many MSM with gonorrhea or   chlamydia could also have syphilis and/or HIV and not know it. If you are a man who has sex with other men, it is very important that you see a medical provider and are tested for HIV and syphilis. EPT is not recommended for gonorrhea for MSM.  Recommended treatment for gonorrhea for MSM is Rocephin (shot) AND azithromycin  due to decreased cure rate.  Please see your medical provider if this is the case.    Along with this information sheet is a prescription for the medicine. If you receive a prescription it will be in your name and will indicate your date of birth, or it will be in the name of Expedited Partner Therapy.   In either case, you can have the prescription filled at a pharmacy. You will be responsible for the cost of the medicine, unless you have prescription drug coverage. In that case, you could provide your name so the pharmacy could bill your health plan.   Take the medication as directed. Some people will have a mild, upset stomach, which does not last long. AVOID alcohol 24 hours after taking metronidazole (Flagyl) to reduce the possibility of a disulfiram-like reaction (severe vomiting and abdominal pain).  After taking the medicine, do not have sex for 7 days. Do not share this medicine or give it to anyone else. It is important to tell everyone you have had sex with in the last 60 days that they need to go and get tested for sexually transmitted infections.   Ways to prevent these and other sexually transmitted infections (STIs):    Abstain from sex. This is the only sure way to avoid getting an STI.   Use barrier methods, such as condoms, consistently and correctly.   Limit the number of sexual partners.   Have regular physical exams, including testing for STIs.   For more information about EPT or other issues pertaining to an STI, please contact your medical provider or the [facility] Department at [phone] or [email or web address]   Trichomoniasis Trichomoniasis is an infection caused by an organism called Trichomonas. The infection can affect both women and men. In women, the outer female genitalia and the vagina are affected. In men, the penis is mainly affected, but the prostate and other reproductive  organs can also be involved. Trichomoniasis is a sexually transmitted infection (STI) and is most often passed to another person through sexual contact.  RISK FACTORS  Having unprotected sexual intercourse.  Having sexual intercourse with an infected partner. SIGNS AND SYMPTOMS  Symptoms of trichomoniasis in women include:  Abnormal gray-green frothy vaginal discharge.  Itching and irritation of the vagina.  Itching and irritation of the area outside the vagina. Symptoms of trichomoniasis in men include:   Penile discharge with or without pain.  Pain during urination. This results from inflammation of the urethra. DIAGNOSIS  Trichomoniasis may be found during a Pap test or physical exam. Your health care provider may use one of the following methods to help diagnose this infection:  Testing the pH of the vagina with a test tape.  Using a vaginal swab test that checks for the Trichomonas organism. A test is available that provides results within a few minutes.  Examining a urine sample.  Testing vaginal secretions. Your health care provider may test you for other STIs, including HIV. TREATMENT   You may be given medicine to fight the infection. Women should inform their health care provider if they could be or are pregnant. Some  medicines used to treat the infection should not be taken during pregnancy.  Your health care provider may recommend over-the-counter medicines or creams to decrease itching or irritation.  Your sexual partner will need to be treated if infected.  Your health care provider may test you for infection again 3 months after treatment. HOME CARE INSTRUCTIONS   Take medicines only as directed by your health care provider.  Take over-the-counter medicine for itching or irritation as directed by your health care provider.  Do not have sexual intercourse while you have the infection.  Women should not douche or wear tampons while they have the  infection.  Discuss your infection with your partner. Your partner may have gotten the infection from you, or you may have gotten it from your partner.  Have your sex partner get examined and treated if necessary.  Practice safe, informed, and protected sex.  See your health care provider for other STI testing. SEEK MEDICAL CARE IF:   You still have symptoms after you finish your medicine.  You develop abdominal pain.  You have pain when you urinate.  You have bleeding after sexual intercourse.  You develop a rash.  Your medicine makes you sick or makes you throw up (vomit). MAKE SURE YOU:  Understand these instructions.  Will watch your condition.  Will get help right away if you are not doing well or get worse.   This information is not intended to replace advice given to you by your health care provider. Make sure you discuss any questions you have with your health care provider.   Document Released: 03/31/2001 Document Revised: 10/26/2014 Document Reviewed: 07/17/2013 Elsevier Interactive Patient Education [employer].

## 2016-05-15 NOTE — MAU Provider Note (Signed)
History     CSN: [id number]  Arrival date and time: 05/15/16 1410   First Provider Initiated Contact with Patient 05/15/16 1526      Chief Complaint  Patient presents with  . Abdominal Pain   Non-pregnant female here with LLQ pain. She describes as constant and sharp. She tried Motrin 400 mg po with little relief. She denies fever, nausea, vomiting, and diarrhea. She denies urinary sx. She reports some discomfort with IC. She has a hx of pelvic pain over the last 5 years and she was hospitalized 2 years ago for TOA and hydrosalpinx. She had left hydrosalpinx on imaging last month and was seen in clinic 2 days ago, the plan is for operative mngt.     Pertinent Gynecological History: Menses: LMP 05/04/16 Contraception: none Sexually transmitted diseases: past history: unsure which  Past Medical History:  Diagnosis Date  . Claustrophobia   . Fallopian tube abscess   . Fibroids   . Headache   . Paranoid schizophrenia [facility])     Past Surgical History:  Procedure Laterality Date  . bunyonectomy    . TOOTH EXTRACTION      Family History  Problem Relation Age of Onset  . Hypertension Mother     Social History  Substance Use Topics  . Smoking status: Current Every Day Smoker    Packs/day: 0.10    Types: Cigarettes  . Smokeless tobacco: Never Used  . Alcohol use 0.0 oz/week     Comment: daily beer 20 oz    Allergies:  Allergies  Allergen Reactions  . Citrus Itching  . Strawberry Extract Hives  . Toradol [Ketorolac Tromethamine] Itching    Prescriptions Prior to Admission  Medication Sig Dispense Refill Last Dose  . ibuprofen (ADVIL,MOTRIN) 200 MG tablet Take 200 mg by mouth every 6 (six) hours as needed for mild pain.    05/15/2016 at Unknown time    Review of Systems  Constitutional: Negative for fever.  Respiratory: Negative.   Cardiovascular: Negative.   Gastrointestinal: Positive for abdominal pain. Negative for constipation, diarrhea, nausea and vomiting.   Genitourinary: Negative.        +thin white non-odorous vaginal discharge x2 days   Physical Exam   Blood pressure 109/62, pulse 81, temperature 98.2 F (36.8 C), temperature source Oral, resp. rate 20, last menstrual period 05/04/2016.  Physical Exam  Constitutional: She is oriented to person, place, and time. She appears well-developed and well-nourished.  HENT:  Head: Normocephalic and atraumatic.  Neck: Normal range of motion.  Cardiovascular: Normal rate.   Respiratory: Effort normal.  GI: Soft. She exhibits no distension. There is tenderness (LLQ). There is no rebound and no guarding.  Genitourinary: Vagina normal and uterus normal.  Genitourinary Comments: External: No lesions Vagina: rugated, parous, moderate thick white discharge Uterus: non-enlarged, anteverted, non-tender Adnexae: no masses, + tenderness left, no tenderness right No CMT  Musculoskeletal: Normal range of motion.  Neurological: She is alert and oriented to person, place, and time.  Skin: Skin is warm and dry.  Psychiatric: She has a normal mood and affect.   Results for orders placed or performed during the hospital encounter of 05/15/16 (from the past 24 hour(s))  Urinalysis, Routine w reflex microscopic (not at [facility])     Status: None   Collection Time: 05/15/16  2:15 PM  Result Value Ref Range   Color, Urine YELLOW YELLOW   APPearance CLEAR CLEAR   Specific Gravity, Urine 1.010 1.005 - 1.030   pH 5.5 5.0 -  8.0   Glucose, UA NEGATIVE NEGATIVE mg/dL   Hgb urine dipstick NEGATIVE NEGATIVE   Bilirubin Urine NEGATIVE NEGATIVE   Ketones, ur NEGATIVE NEGATIVE mg/dL   Protein, ur NEGATIVE NEGATIVE mg/dL   Nitrite NEGATIVE NEGATIVE   Leukocytes, UA NEGATIVE NEGATIVE  Wet prep, genital     Status: Abnormal   Collection Time: 05/15/16  4:00 PM  Result Value Ref Range   Yeast Wet Prep HPF POC NONE SEEN NONE SEEN   Trich, Wet Prep PRESENT (A) NONE SEEN   Clue Cells Wet Prep HPF POC PRESENT (A) NONE SEEN    WBC, Wet Prep HPF POC FEW (A) NONE SEEN   Sperm NONE SEEN   Pregnancy, urine POC     Status: None   Collection Time: 05/15/16  4:30 PM  Result Value Ref Range   Preg Test, Ur NEGATIVE NEGATIVE    MAU Course  Procedures  MDM Labs ordered and reviewed. GC/CMT pending. Morphine 2 mg IM x1 w/good relief of pain. Flagyl 2g po x1. Stable for discharge home.   Assessment and Plan   1. Pelvic pain in female   2. Trichimoniasis    Discharge home Flagyl Rx for partner Ibuprofen 800 mg po q8 hrs prn #30, no refill Follow up with clinic to scheduled surgery  Julianne Handler, CNM 05/15/2016, 3:26 PM

## 2016-05-18 LAB — GC/CHLAMYDIA PROBE AMP (~~LOC~~) NOT AT ARMC
Chlamydia: NEGATIVE
Neisseria Gonorrhea: NEGATIVE

## 2016-06-12 ENCOUNTER — Encounter: Payer: Self-pay | Admitting: *Deleted

## 2016-06-12 NOTE — Progress Notes (Signed)
   DOS 06-04-16  [place] osteotomy right, [place] bunionectomy right

## 2016-06-24 ENCOUNTER — Ambulatory Visit (INDEPENDENT_AMBULATORY_CARE_PROVIDER_SITE_OTHER): Payer: Medicaid Other | Admitting: Obstetrics & Gynecology

## 2016-06-24 ENCOUNTER — Encounter: Payer: Self-pay | Admitting: Obstetrics & Gynecology

## 2016-06-24 VITALS — BP 129/94 | HR 57 | Ht 61.0 in | Wt 135.0 lb

## 2016-06-24 DIAGNOSIS — N949 Unspecified condition associated with female genital organs and menstrual cycle: Secondary | ICD-10-CM | POA: Diagnosis present

## 2016-06-24 DIAGNOSIS — N7011 Chronic salpingitis: Secondary | ICD-10-CM | POA: Diagnosis not present

## 2016-06-24 DIAGNOSIS — R102 Pelvic and perineal pain: Secondary | ICD-10-CM

## 2016-06-24 DIAGNOSIS — G8929 Other chronic pain: Secondary | ICD-10-CM | POA: Diagnosis not present

## 2016-06-24 DIAGNOSIS — Z23 Encounter for immunization: Secondary | ICD-10-CM | POA: Diagnosis not present

## 2016-06-24 NOTE — Patient Instructions (Signed)
Salpingectomy, Care After Refer to this sheet in the next few weeks. These instructions provide you with information on caring for yourself after your procedure. Your health care provider may also give you more specific instructions. Your treatment has been planned according to current medical practices, but problems sometimes occur. Call your health care provider if you have any problems or questions after your procedure. WHAT TO EXPECT AFTER THE PROCEDURE After your procedure, it is typical to have the following:  Abdominal pain that can be controlled with pain medicine.  Vaginal spotting.  Tiredness. HOME CARE INSTRUCTIONS  Get plenty of rest and sleep.  Only take over-the-counter or prescription medicines as directed by your health care provider.  Keep incision areas clean and dry. Remove or change any bandages (dressings) only as directed by your health care provider.  You may resume your regular diet. Eat a well-balanced diet.  Drink enough fluids to keep your urine clear or pale yellow.  Limit exercise and activities as directed by your health care provider. Do not lift anything heavier than 5 lb (2.3 kg) until your health care provider approves.  Do not drive until your health care provider approves.  Do not have sexual intercourse until your health care provider says it is okay.  Take your temperature twice a day for the first week. Write those temperatures down.  Follow up with your health care provider as directed. SEEK MEDICAL CARE IF:  You have pain when you urinate.  You see pus coming out of the incision, or the incision is separating.  You have increasing abdominal pain.  You have swelling or redness in the incision area.  You develop a rash.  You feel lightheaded.  You have pain that is not controlled with medicine. SEEK IMMEDIATE MEDICAL CARE IF:  You develop a fever.  You have increasing abdominal pain.  You develop chest or leg pain.  You  develop shortness of breath.  You pass out.   This information is not intended to replace advice given to you by your health care provider. Make sure you discuss any questions you have with your health care provider.   Document Released: 01/09/2011 Document Revised: 10/26/2014 Document Reviewed: 03/29/2013 Elsevier Interactive Patient Education 2016 [place], also called tubectomy, is the surgical removal of one of the fallopian tubes. The fallopian tubes are tubes that are connected to the uterus. These tubes transport the egg from the ovary to the uterus. A salpingectomy may be done for various reasons, including:   A tubal (ectopic) pregnancy. This is especially true if the tube ruptures.  An infected fallopian tube.  The need to remove the fallopian tube when removing an ovary with a cyst or tumor.  The need to remove the fallopian tube when removing the uterus.  Cancer of the fallopian tube or nearby organs. Removing one fallopian tube does not prevent you from becoming pregnant. It also does not cause problems with your menstrual periods.  LET [facility] CARE PROVIDER KNOW ABOUT:  Any allergies you have.  All medicines you are taking, including vitamins, herbs, eye drops, creams, and over-the-counter medicines.  Previous problems you or members of your family have had with the use of anesthetics.  Any blood disorders you have.  Previous surgeries you have had.  Medical conditions you have. RISKS AND COMPLICATIONS  Generally, this is a safe procedure. However, as with any procedure, complications can occur. Possible complications include:  Injury to surrounding organs.  Bleeding.  Infection.  Problems related to anesthesia. BEFORE THE PROCEDURE  Ask your health care provider about changing or stopping your regular medicines. You may need to stop taking certain medicines, such as aspirin or blood thinners, at least 1 week before the  surgery.  Do not eat or drink anything for at least 8 hours before the surgery.  If you smoke, do not smoke for at least 2 weeks before the surgery.  Make plans to have someone drive you home after the procedure or after your hospital stay. Also arrange for someone to help you with activities during recovery. PROCEDURE   You will be given medicine to help you relax before the procedure (sedative). You will then be given medicine to make you sleep through the procedure (general anesthetic). These medicines will be given through an IV access tube that is put into one of your veins.  Once you are asleep, your lower abdomen will be shaved and cleaned. A thin, flexible tube (catheter) will be placed in your bladder.  The surgeon may use a laparoscopic, robotic, or open technique for this surgery:  In the laparoscopic technique, the surgery is done through two small cuts (incisions) in the abdomen. A thin, lighted tube with a tiny camera on the end (laparoscope) is inserted into one of the incisions. The tools needed for the procedure are put through the other incision.  A robotic technique may be chosen to perform complex surgery in a small space. In the robotic technique, small incisions will be made. A camera and surgical instruments are passed through the incisions. Surgical instruments will be controlled with the help of a robotic arm.  In the open technique, the surgery is done through one large incision in the abdomen.  Using any of these techniques, the surgeon removes the fallopian tube from where it attaches to the uterus. The blood vessels will be clamped and tied.  The surgeon then uses staples or stitches to close the incision or incisions. AFTER THE PROCEDURE   You will be taken to a recovery area where your progress will be monitored for 1-3 hours.  If the laparoscopic technique was used, you may be allowed to go home after several hours. You may have some shoulder pain after the  laparoscopic procedure. This is normal and usually goes away in a day or two.  If the open technique was used, you will be admitted to the hospital for a couple of days.  You will be given pain medicine if needed.  The IV access tube and catheter will be removed before you are discharged.   This information is not intended to replace advice given to you by your health care provider. Make sure you discuss any questions you have with your health care provider.   Document Released: 02/21/2009 Document Revised: 10/26/2014 Document Reviewed: 03/29/2013 Elsevier Interactive Patient Education [employer].

## 2016-06-24 NOTE — Progress Notes (Signed)
History:  35 y.o. G2P0110 here today for surgical consult for eval of hydrosalpinx. Pt reports chronic pelvic pain and long term hydrosalpinx. She reports multiple courses of atbx  She denies pain with her cycles or heavy bleeding. She wants to preserve her fertility and is aware that she has fibroids but, denies pressure or pain from them.   The following portions of the patient's history were reviewed and updated as appropriate: allergies, current medications, past family history, past medical history, past social history, past surgical history and problem list.  Past Medical History:  Diagnosis Date  . Claustrophobia   . Fallopian tube abscess   . Fibroids   . Headache   . Paranoid schizophrenia ([place])   h/o drug use    Review of Systems:  Pertinent items are noted in HPI.  Objective:  Physical Exam Blood pressure (!) 129/94, pulse (!) 57, height 5\' 1"  (1.549 m), weight 135 lb (61.2 kg), last menstrual period 05/30/2016. Gen: NAD Lungs: CTA CV: RRR Abd: Soft, nontender and nondistended Pelvic: pelvic exam deferred  Labs and Imaging 04/13/2016 CLINICAL DATA:  Left lower quadrant pain for 1 week. Left hydrosalpinx and previous tubo-ovarian abscess.  EXAM: TRANSABDOMINAL AND TRANSVAGINAL ULTRASOUND OF PELVIS  TECHNIQUE: Both transabdominal and transvaginal ultrasound examinations of the pelvis were performed. Transabdominal technique was performed for global imaging of the pelvis including uterus, ovaries, adnexal regions, and pelvic cul-de-sac. It was necessary to proceed with endovaginal exam following the transabdominal exam to visualize the endometrium and adnexal regions.  COMPARISON:  Ultrasound on 01/17/2015 and MRI on 10/17/2014  FINDINGS: Uterus  Measurements: 10.7 x 3.5 x 6.2 cm. Left fundal subserosal fibroid is again seen measuring 6.2 cm in maximum diameter compared to 4.4 cm on previous MRI. A smaller subserosal fibroid is again seen in the right  posterior uterine corpus measuring 3.5 cm in maximum diameter, compared to 2.4 cm on previous MRI.  Endometrium  Thickness: 7 mm.  No focal abnormality visualized.  Right ovary  Measurements: 4.3 x 2.8 x 2.2 cm. Normal appearance/no adnexal mass.  Left ovary  Measurements: 2.5 x 2.7 x 3.0 cm. Normal appearance of left ovary. A tubular cystic structure is again seen in the left adnexa which appears separate from the ovary and measures approximately 5.8 x 2.2 x 5.6 cm. This is consistent with hydrosalpinx, and is without significant change compared to previous studies.  Other findings  No abnormal free fluid.  IMPRESSION: Two subserosal uterine fibroids, mildly increased in size compared to previous studies.  No significant change in moderate left-sided hydrosalpinx.  Normal appearance of both ovaries. No other adnexal mass identified.   Assessment & Plan:  Chronic pelvic pain on left side. Left hydrosalpinx- pt requests definitive tx of hydrosalpinx  Patient desires surgical management with laparoscopy with left salpingectomy .  The risks of surgery were discussed in detail with the patient including but not limited to: bleeding which may require transfusion or reoperation; infection which may require prolonged hospitalization or re-hospitalization and antibiotic therapy; injury to bowel, bladder, ureters and major vessels or other surrounding organs; need for additional procedures including laparotomy; thromboembolic phenomenon, incisional problems and other postoperative or anesthesia complications.  Patient was told that the likelihood that her condition and symptoms will be treated effectively with this surgical management was very high; the postoperative expectations were also discussed in detail. The patient also understands the alternative treatment options which were discussed in full. All questions were answered.  She was told that she will  be contacted by our  surgical scheduler regarding the time and date of her surgery; routine preoperative instructions of having nothing to eat or drink after midnight on the day prior to surgery and also coming to the hospital 1 1/2 hours prior to her time of surgery were also emphasized.  She was told she may be called for a preoperative appointment about a week prior to surgery and will be given further preoperative instructions at that visit. Printed patient education handouts about the procedure were given to the patient to review at home.  Pt was counseled to stop taking NSAIDS  Ayanni Tun L. Harraway-Smith, M.D., Cherlynn June

## 2016-06-25 ENCOUNTER — Encounter (HOSPITAL_COMMUNITY): Payer: Self-pay | Admitting: *Deleted

## 2016-07-02 ENCOUNTER — Encounter (HOSPITAL_COMMUNITY): Payer: Self-pay | Admitting: *Deleted

## 2016-07-14 NOTE — Patient Instructions (Signed)
Your procedure is scheduled on:  Monday, Oct. 9, 2017  Enter through the [employer] [facility] at:  12:30 PM  Pick up the phone at the desk and dial [phone].  Call this number if you have problems the morning of surgery: [phone].  Remember: Do NOT eat food:  After Midnight Sunday, Oct. 8, 2017  Do NOT drink clear liquids after:  10:00 AM day of surgery  Take these medicines the morning of surgery with a SIP OF WATER:  None  Stop taking Ibuprofen at this time  Do NOT wear jewelry (body piercing), metal hair clips/bobby pins, make-up, or nail polish. Do NOT wear lotions, powders, or perfumes.  You may wear deodorant. Do NOT shave for 48 hours prior to surgery. Do NOT bring valuables to the hospital. Contacts, dentures, or bridgework may not be worn into surgery.  Have a responsible adult drive you home and stay with you for 24 hours after your procedure

## 2016-07-15 ENCOUNTER — Encounter (HOSPITAL_COMMUNITY)
Admission: RE | Admit: 2016-07-15 | Discharge: 2016-07-15 | Disposition: A | Discharge status: Home / Self Care | Payer: Medicaid Other | Source: Ambulatory Visit | Attending: Obstetrics & Gynecology | Admitting: Obstetrics & Gynecology

## 2016-07-15 ENCOUNTER — Encounter (HOSPITAL_COMMUNITY): Payer: Self-pay

## 2016-07-15 DIAGNOSIS — Z01812 Encounter for preprocedural laboratory examination: Secondary | ICD-10-CM | POA: Diagnosis not present

## 2016-07-15 HISTORY — DX: Unspecified osteoarthritis, unspecified site: M19.90

## 2016-07-15 LAB — CBC
HCT: 36.6 % (ref 36.0–46.0)
HEMOGLOBIN: 12.7 g/dL (ref 12.0–15.0)
MCH: 30.6 pg (ref 26.0–34.0)
MCHC: 34.7 g/dL (ref 30.0–36.0)
MCV: 88.2 fL (ref 78.0–100.0)
Platelets: 303 10*3/uL (ref 150–400)
RBC: 4.15 MIL/uL (ref 3.87–5.11)
RDW: 14.6 % (ref 11.5–15.5)
WBC: 7.9 10*3/uL (ref 4.0–10.5)

## 2016-07-23 IMAGING — US US TRANSVAGINAL NON-OB
1 series · 15 of 25 positions shown · non-contrast
Comparison: Ultrasound on 01/17/2015 and MRI on 10/17/2014

CLINICAL DATA: Left lower quadrant pain for 1 week. Left
hydrosalpinx and previous tubo-ovarian abscess.

EXAM:
TRANSABDOMINAL AND TRANSVAGINAL ULTRASOUND OF PELVIS
TECHNIQUE: Both transabdominal and transvaginal ultrasound examinations of the
pelvis were performed. Transabdominal technique was performed for
global imaging of the pelvis including uterus, ovaries, adnexal
regions, and pelvic cul-de-sac. It was necessary to proceed with
endovaginal exam following the transabdominal exam to visualize the
endometrium and adnexal regions..

[Series 1: us transvaginal non-ob · 88 acquisitions, 15 frames shown]
[im 1/88]
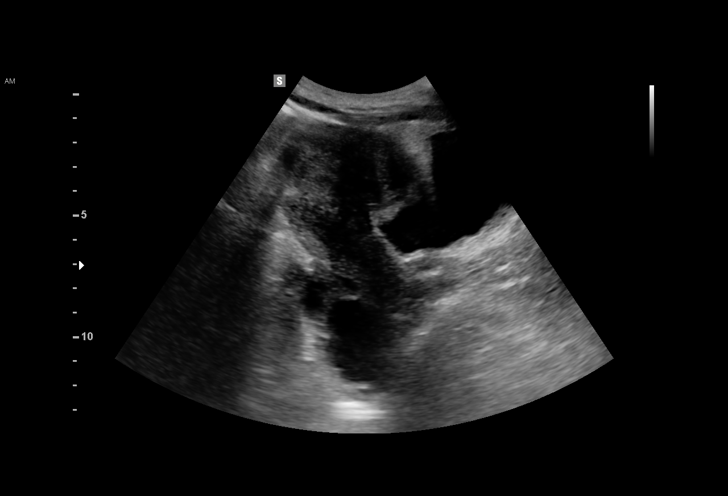
[im 8/88]
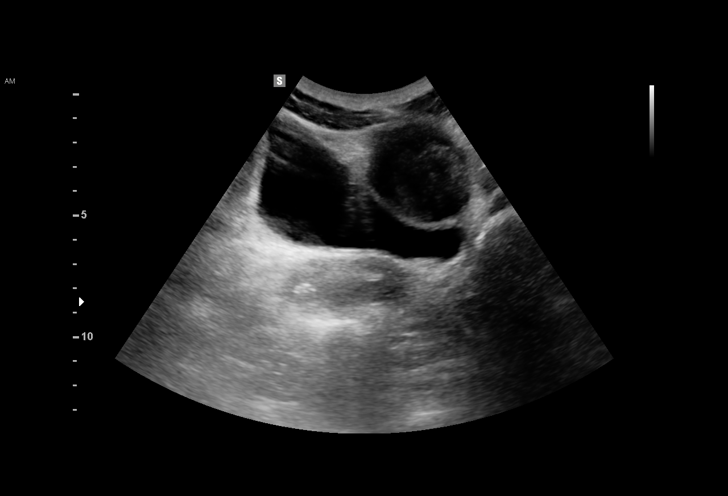
[im 15/88]
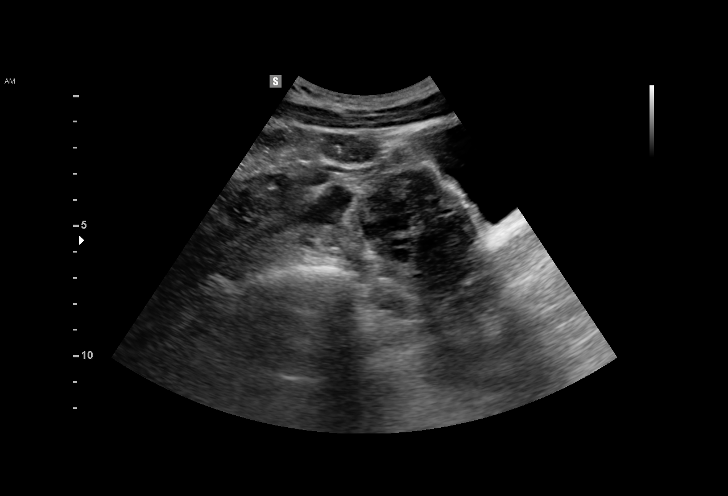
[im 19/88]
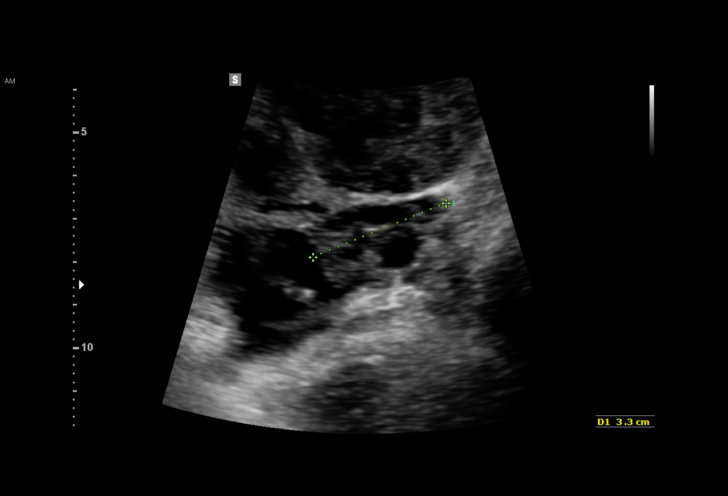
[im 26/88]
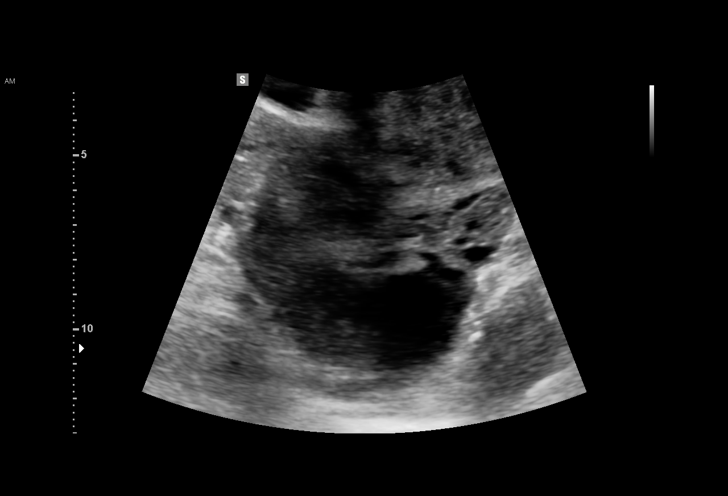
[im 33/88]
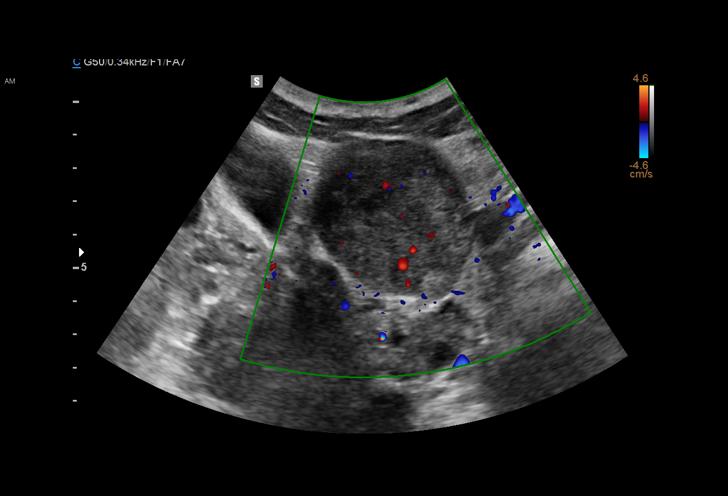
[im 37/88]
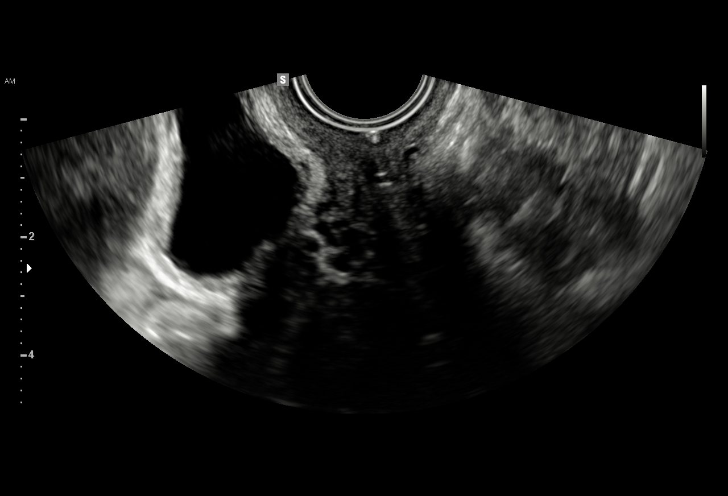
[im 44/88]
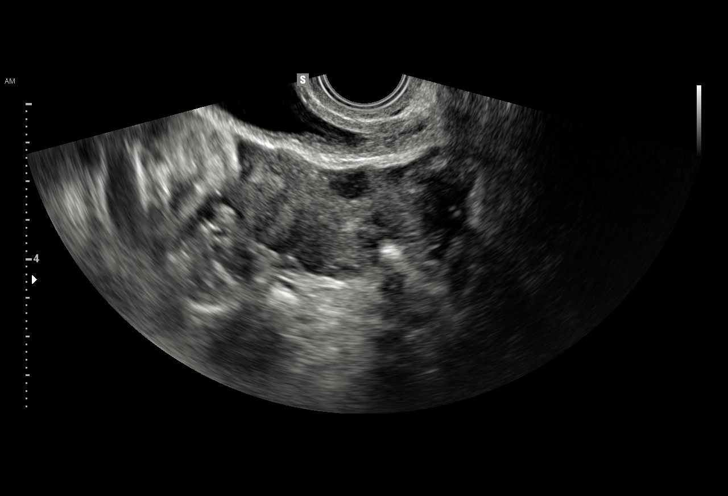
[im 51/88]
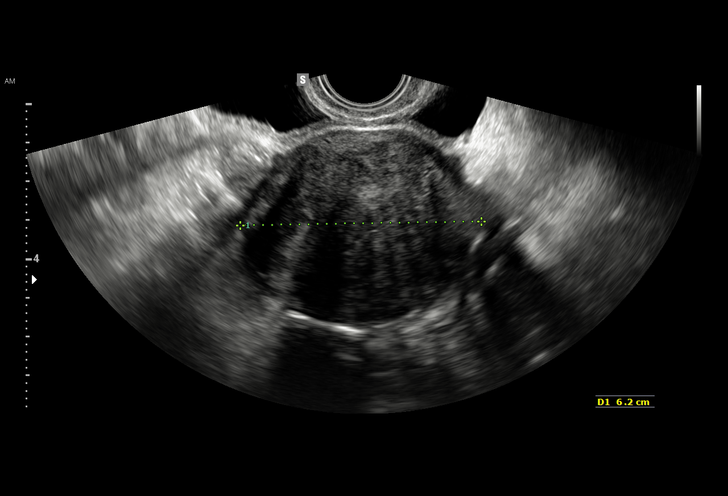
[im 55/88]
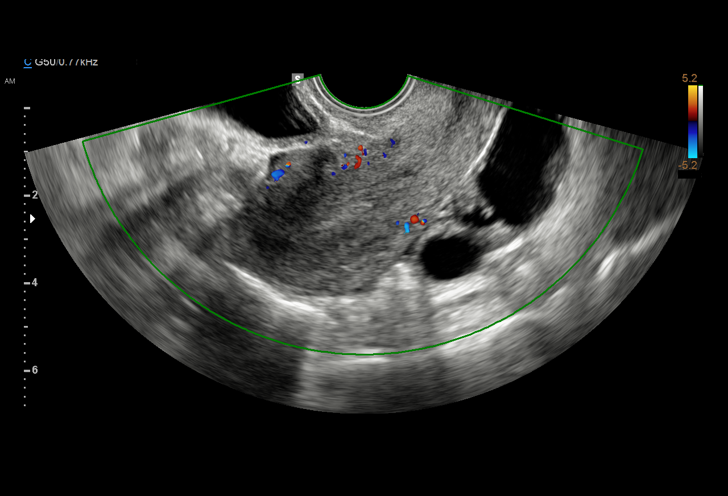
[im 62/88]
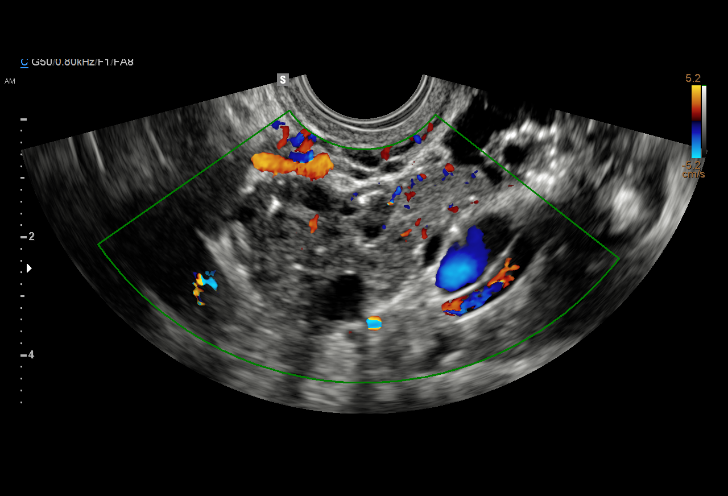
[im 69/88]
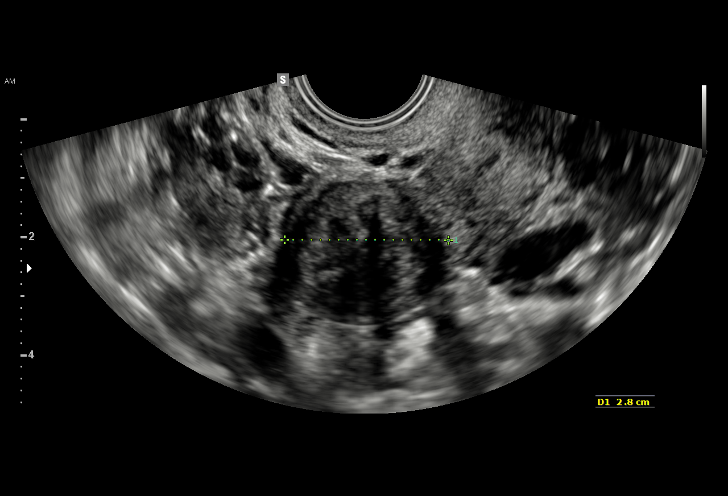
[im 73/88]
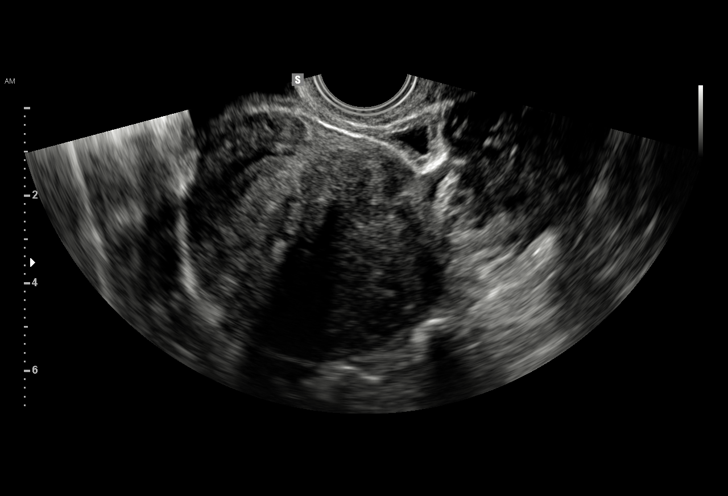
[im 80/88]
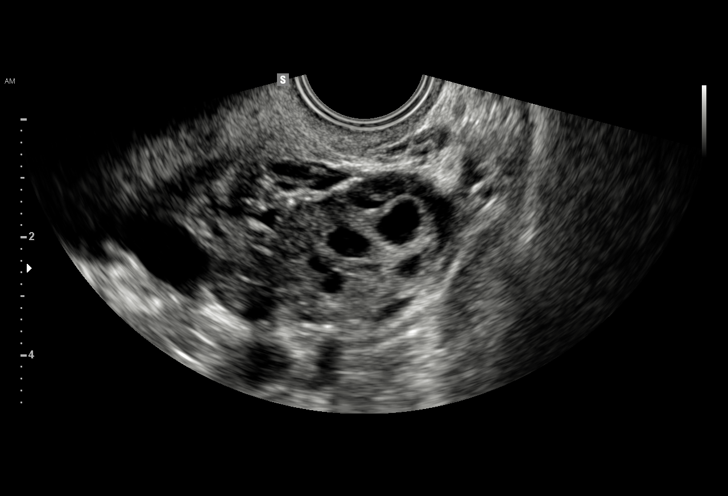
[im 88/88]
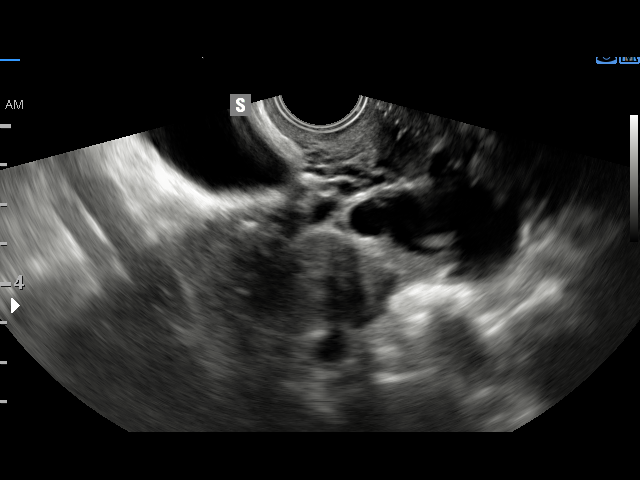

[15 of 25 positions shown; findings below may reference images not displayed]

FINDINGS: Uterus

Measurements: 10.7 x 3.5 x 6.2 cm. Left fundal subserosal fibroid is
again seen measuring 6.2 cm in maximum diameter compared to 4.4 cm
on previous MRI. A smaller subserosal fibroid is again seen in the
right posterior uterine corpus measuring 3.5 cm in maximum diameter,
compared to 2.4 cm on previous MRI.

Endometrium

Thickness: 7 mm.  No focal abnormality visualized.

Right ovary

Measurements: 4.3 x 2.8 x 2.2 cm. Normal appearance/no adnexal mass.

Left ovary

Measurements: 2.5 x 2.7 x 3.0 cm. Normal appearance of left ovary. A
tubular cystic structure is again seen in the left adnexa which
appears separate from the ovary and measures approximately 5.8 x
x 5.6 cm. This is consistent with hydrosalpinx, and is without
significant change compared to previous studies.

Other findings

No abnormal free fluid.
IMPRESSION: Two subserosal uterine fibroids, mildly increased in size compared
to previous studies.

No significant change in moderate left-sided hydrosalpinx.

Normal appearance of both ovaries. No other adnexal mass identified.

## 2016-07-27 ENCOUNTER — Ambulatory Visit (HOSPITAL_COMMUNITY)
Admission: RE | Admit: 2016-07-27 | Discharge: 2016-07-27 | Disposition: A | Discharge status: Home / Self Care | Payer: Medicaid Other | Source: Ambulatory Visit | Attending: Obstetrics & Gynecology | Admitting: Obstetrics & Gynecology

## 2016-07-27 ENCOUNTER — Encounter (HOSPITAL_COMMUNITY)
Admission: RE | Disposition: A | Discharge status: Home / Self Care | Payer: Self-pay | Source: Ambulatory Visit | Attending: Obstetrics & Gynecology

## 2016-07-27 ENCOUNTER — Ambulatory Visit (HOSPITAL_COMMUNITY): Payer: Medicaid Other | Admitting: Anesthesiology

## 2016-07-27 ENCOUNTER — Encounter (HOSPITAL_COMMUNITY): Payer: Self-pay | Admitting: *Deleted

## 2016-07-27 DIAGNOSIS — N7011 Chronic salpingitis: Secondary | ICD-10-CM | POA: Insufficient documentation

## 2016-07-27 DIAGNOSIS — Z538 Procedure and treatment not carried out for other reasons: Secondary | ICD-10-CM | POA: Diagnosis not present

## 2016-07-27 HISTORY — DX: Reserved for inherently not codable concepts without codable children: IMO0001

## 2016-07-27 LAB — RAPID URINE DRUG SCREEN, HOSP PERFORMED
AMPHETAMINES: NOT DETECTED
BARBITURATES: NOT DETECTED
Benzodiazepines: NOT DETECTED
Cocaine: POSITIVE — AB
OPIATES: NOT DETECTED
TETRAHYDROCANNABINOL: POSITIVE — AB

## 2016-07-27 LAB — PREGNANCY, URINE: Preg Test, Ur: NEGATIVE

## 2016-07-27 SURGERY — CANCELLED PROCEDURE

## 2016-07-27 MED ORDER — LIDOCAINE HCL (CARDIAC) 20 MG/ML IV SOLN
INTRAVENOUS | Status: AC
  Filled 2016-07-27: qty 5

## 2016-07-27 MED ORDER — FENTANYL CITRATE (PF) 250 MCG/5ML IJ SOLN
INTRAMUSCULAR | Status: AC
  Filled 2016-07-27: qty 5

## 2016-07-27 MED ORDER — ROCURONIUM BROMIDE 100 MG/10ML IV SOLN
INTRAVENOUS | Status: AC
  Filled 2016-07-27: qty 1

## 2016-07-27 MED ORDER — LACTATED RINGERS IV SOLN: INTRAVENOUS | Status: DC

## 2016-07-27 MED ORDER — SCOPOLAMINE 1 MG/3DAYS TD PT72
MEDICATED_PATCH | TRANSDERMAL | Status: DC
Start: 2016-07-27 — End: 2016-07-27
  Filled 2016-07-27: qty 1

## 2016-07-27 MED ORDER — SCOPOLAMINE 1 MG/3DAYS TD PT72
1.0000 | MEDICATED_PATCH | Freq: Once | TRANSDERMAL | Status: DC
  Administered 2016-07-27: 1.5 mg via TRANSDERMAL

## 2016-07-27 MED ORDER — ONDANSETRON HCL 4 MG/2ML IJ SOLN
INTRAMUSCULAR | Status: AC
Start: 2016-07-27 — End: 2016-07-27
  Filled 2016-07-27: qty 2

## 2016-07-27 MED ORDER — KETOROLAC TROMETHAMINE 30 MG/ML IJ SOLN
INTRAMUSCULAR | Status: AC
  Filled 2016-07-27: qty 1

## 2016-07-27 MED ORDER — MIDAZOLAM HCL 2 MG/2ML IJ SOLN
INTRAMUSCULAR | Status: AC
  Filled 2016-07-27: qty 2

## 2016-07-27 MED ORDER — NEOSTIGMINE METHYLSULFATE 10 MG/10ML IV SOLN
INTRAVENOUS | Status: AC
  Filled 2016-07-27: qty 1

## 2016-07-27 MED ORDER — GLYCOPYRROLATE 0.2 MG/ML IJ SOLN
INTRAMUSCULAR | Status: AC
  Filled 2016-07-27: qty 3

## 2016-07-27 MED ORDER — PROPOFOL 10 MG/ML IV BOLUS
INTRAVENOUS | Status: AC
  Filled 2016-07-27: qty 20

## 2016-07-27 SURGICAL SUPPLY — 32 items
BAG SPEC RTRVL LRG 6X4 10 (ENDOMECHANICALS)
CABLE HIGH FREQUENCY MONO STRZ (ELECTRODE) IMPLANT
CATH ROBINSON RED A/P 16FR (CATHETERS) IMPLANT
CLOTH BEACON ORANGE TIMEOUT ST (SAFETY) ×4 IMPLANT
DRSG OPSITE POSTOP 3X4 (GAUZE/BANDAGES/DRESSINGS) IMPLANT
DURAPREP 26ML APPLICATOR (WOUND CARE) ×4 IMPLANT
GLOVE BIO SURGEON STRL SZ7 (GLOVE) ×8 IMPLANT
GLOVE BIOGEL PI IND STRL 7.0 (GLOVE) ×4 IMPLANT
GLOVE BIOGEL PI INDICATOR 7.0 (GLOVE) ×4
GOWN STRL REUS W/TWL LRG LVL3 (GOWN DISPOSABLE) ×8 IMPLANT
GOWN STRL REUS W/TWL XL LVL3 (GOWN DISPOSABLE) ×4 IMPLANT
MANIPULATOR UTERINE 4.5 ZUMI (MISCELLANEOUS) ×4 IMPLANT
NEEDLE INSUFFLATION 120MM (ENDOMECHANICALS) ×4 IMPLANT
NS IRRIG 1000ML POUR BTL (IV SOLUTION) ×4 IMPLANT
PACK LAPAROSCOPY BASIN (CUSTOM PROCEDURE TRAY) ×4 IMPLANT
PACK TRENDGUARD 450 HYBRID PRO (MISCELLANEOUS) IMPLANT
PACK TRENDGUARD 600 HYBRD PROC (MISCELLANEOUS) IMPLANT
POUCH SPECIMEN RETRIEVAL 10MM (ENDOMECHANICALS) IMPLANT
PROTECTOR NERVE ULNAR (MISCELLANEOUS) ×8 IMPLANT
SET IRRIG TUBING LAPAROSCOPIC (IRRIGATION / IRRIGATOR) IMPLANT
SHEARS HARMONIC ACE PLUS 36CM (ENDOMECHANICALS) ×4 IMPLANT
SLEEVE XCEL OPT CAN 5 100 (ENDOMECHANICALS) IMPLANT
SUT VICRYL 0 UR6 27IN ABS (SUTURE) IMPLANT
SUT VICRYL 4-0 PS2 18IN ABS (SUTURE) ×8 IMPLANT
SYR 5ML LL (SYRINGE) ×4 IMPLANT
TOWEL OR 17X24 6PK STRL BLUE (TOWEL DISPOSABLE) ×8 IMPLANT
TRAY FOLEY CATH SILVER 14FR (SET/KITS/TRAYS/PACK) IMPLANT
TRENDGUARD 450 HYBRID PRO PACK (MISCELLANEOUS)
TRENDGUARD 600 HYBRID PROC PK (MISCELLANEOUS)
TROCAR OPTI TIP 5M 100M (ENDOMECHANICALS) ×4 IMPLANT
TROCAR XCEL DIL TIP R 11M (ENDOMECHANICALS) ×4 IMPLANT
WARMER LAPAROSCOPE (MISCELLANEOUS) ×4 IMPLANT

## 2016-08-03 ENCOUNTER — Ambulatory Visit: Payer: Medicaid Other | Attending: Internal Medicine | Admitting: Physician Assistant

## 2016-08-03 VITALS — BP 151/90 | HR 55 | Temp 98.1°F | Resp 16 | Wt 133.0 lb

## 2016-08-03 DIAGNOSIS — R05 Cough: Secondary | ICD-10-CM | POA: Diagnosis present

## 2016-08-03 DIAGNOSIS — J209 Acute bronchitis, unspecified: Secondary | ICD-10-CM | POA: Insufficient documentation

## 2016-08-03 MED ORDER — AZITHROMYCIN 250 MG PO TABS: ORAL_TABLET | ORAL | 0 refills | Status: DC

## 2016-08-03 MED ORDER — BENZONATATE 200 MG PO CAPS: 200.0000 mg | ORAL_CAPSULE | Freq: Two times a day (BID) | ORAL | 0 refills | Status: DC | PRN

## 2016-08-03 MED ORDER — LORATADINE 10 MG PO TABS
10.0000 mg | ORAL_TABLET | Freq: Every day | ORAL | 11 refills | Status: DC
Start: 2016-08-03 — End: 2016-08-20

## 2016-08-03 NOTE — Progress Notes (Signed)
Jessica Case, is a 35 y.o. female  [id number]  [id number]  DOB - May 09, 1981  Subjective:  Chief Complaint and HPI: Jessica Case is a 35 y.o. female here today with 10 day h/o cough that is worsening.  She is coughing up green mucus.  No fever/chills.  Also wants RF of claritin.      ROS:   Constitutional:  No f/c, No night sweats, No unexplained weight loss. EENT:  No vision changes, No blurry vision, No hearing changes. No mouth, throat, or ear problems.  Respiratory: + cough, No SOB Cardiac: No CP, no palpitations GI:  No abd pain, No N/V/D. GU: No Urinary s/sx Musculoskeletal: No joint pain Neuro: No headache, no dizziness, no motor weakness.  Skin: No rash Endocrine:  No polydipsia. No polyuria.  Psych: Denies SI/HI  No problems updated.  ALLERGIES: Allergies  Allergen Reactions  . Citrus Itching  . Strawberry Extract Hives  . Toradol [Ketorolac Tromethamine] Itching    PAST MEDICAL HISTORY: Past Medical History:  Diagnosis Date  . Arthritis   . Claustrophobia   . Cold    currently - no meds  . Fallopian tube abscess   . Fibroids   . Paranoid schizophrenia ([place])   . SVD (spontaneous vaginal delivery)    x 1    MEDICATIONS AT HOME: Prior to Admission medications   Medication Sig Start Date End Date Taking? Authorizing Provider  hydrOXYzine (VISTARIL) 50 MG capsule Take 50 mg by mouth daily.   Yes Historical Provider, MD  traZODone (DESYREL) 150 MG tablet Take 150 mg by mouth at bedtime.   Yes Historical Provider, MD  ziprasidone (GEODON) 60 MG capsule Take 60 mg by mouth daily.   Yes Historical Provider, MD  azithromycin (ZITHROMAX) 250 MG tablet Take 2 today then 1 each day thereafer 08/03/16   Argentina Donovan, PA-C  benzonatate (TESSALON) 200 MG capsule Take 1 capsule (200 mg total) by mouth 2 (two) times daily as needed for cough. 08/03/16   Argentina Donovan, PA-C  ibuprofen (ADVIL,MOTRIN) 800 MG tablet Take 1 tablet (800 mg total)  by mouth every 8 (eight) hours as needed for moderate pain. Patient not taking: Reported on 08/03/2016 05/15/16   Jorje Guild, NP  loratadine (CLARITIN) 10 MG tablet Take 1 tablet (10 mg total) by mouth daily. 08/03/16   Argentina Donovan, PA-C     Objective:  EXAM:   Vitals:   08/03/16 1410  BP: (!) 151/90  Pulse: (!) 55  Resp: 16  Temp: 98.1 F (36.7 C)  TempSrc: Oral  SpO2: 99%  Weight: 133 lb (60.3 kg)    General appearance : A&OX3. NAD. Non-toxic-appearing HEENT: Atraumatic and Normocephalic.  PERRLA. EOM intact.  TM clear B. Mouth-MMM, post pharynx WNL w/o erythema, No PND. Neck: supple, no JVD. No cervical lymphadenopathy. No thyromegaly Chest/Lungs:  Breathing-non-labored, Good air entry bilaterally, breath sounds normal without rales or wheezing.  Mild rhonchi B lung bases.  CVS: S1 S2 regular, no murmurs, gallops, rubs  Abdomen: Bowel sounds present, Non tender and not distended with no gaurding, rigidity or rebound. Extremities: Bilateral Lower Ext shows no edema, both legs are warm to touch with = pulse throughout Neurology:  CN II-XII grossly intact, Non focal.   Psych:  TP linear. J/I WNL. Normal speech. Appropriate eye contact and affect.  Skin:  No Rash  Data Review Lab Results  Component Value Date   HGBA1C 5.0 11/15/2014     Assessment & Plan  1. Acute bronchitis, unspecified organism - azithromycin (ZITHROMAX) 250 MG tablet; Take 2 today then 1 each day thereafer  Dispense: 6 tablet; Refill: 0 - benzonatate (TESSALON) 200 MG capsule; Take 1 capsule (200 mg total) by mouth 2 (two) times daily as needed for cough.  Dispense: 20 capsule; Refill: 0 Increase fluids and rest.  Proper hydration and rest are imperative.  Recheck in 48 hours if not improving;  Sooner if worse.    Patient have been counseled extensively about nutrition and exercise  Return in about 3 months (around 11/03/2016) for check up.  The patient was given clear instructions to go to  ER or return to medical center if symptoms don't improve, worsen or new problems develop. The patient verbalized understanding. The patient was told to call to get lab results if they haven't heard anything in the next week.     Freeman Caldron, PA-C [facility] [place], [place]   08/03/2016, 2:22 PMPatient ID: Madell Ratzel, female   DOB: January 27, 1981, 35 y.o.   MRN: [id number]

## 2016-08-03 NOTE — Patient Instructions (Signed)

## 2016-08-03 NOTE — Progress Notes (Signed)
Pt is in the office today for cold symptoms Pt states she needs a refill on her Claritin Pt states she has been coughing up green mucous Pt states her symptoms has been going for a week and half Pt states she has a runny nose

## 2016-08-05 NOTE — Patient Instructions (Addendum)
Your procedure is scheduled on:  Tuesday, Oct. 31, 2017  Enter through the [employer] [facility] at:  1:00 PM  Pick up the phone at the desk and dial [phone].  Call this number if you have problems the morning of surgery: [phone].  Remember: Do NOT eat food:  After Midnight Monday, Oct. 30, 2017  Do NOT drink clear liquids after:  10:00 AM day of surgery  Take these medicines the morning of surgery with a SIP OF WATER:  Ziprasidone  Stop ALL herbal medications at this time   Do NOT wear jewelry (body piercing), metal hair clips/bobby pins, make-up, or nail polish. Do NOT wear lotions, powders, or perfumes.  You may wear deodorant. Do NOT shave for 48 hours prior to surgery. Do NOT bring valuables to the hospital. Contacts, dentures, or bridgework may not be worn into surgery.  Have a responsible adult drive you home and stay with you for 24 hours after your procedure

## 2016-08-06 ENCOUNTER — Inpatient Hospital Stay (HOSPITAL_COMMUNITY)
Admission: RE | Admit: 2016-08-06 | Discharge: 2016-08-06 | Disposition: A | Discharge status: Home / Self Care | Payer: Medicaid Other | Source: Ambulatory Visit

## 2016-08-13 ENCOUNTER — Encounter (HOSPITAL_COMMUNITY)
Admission: RE | Admit: 2016-08-13 | Discharge: 2016-08-13 | Disposition: A | Discharge status: Home / Self Care | Payer: Medicaid Other | Source: Ambulatory Visit | Attending: Obstetrics & Gynecology | Admitting: Obstetrics & Gynecology

## 2016-08-13 ENCOUNTER — Encounter (HOSPITAL_COMMUNITY): Payer: Self-pay

## 2016-08-13 DIAGNOSIS — Z01812 Encounter for preprocedural laboratory examination: Secondary | ICD-10-CM | POA: Insufficient documentation

## 2016-08-13 LAB — CBC
HCT: 38.1 % (ref 36.0–46.0)
Hemoglobin: 13.5 g/dL (ref 12.0–15.0)
MCH: 31 pg (ref 26.0–34.0)
MCHC: 35.4 g/dL (ref 30.0–36.0)
MCV: 87.4 fL (ref 78.0–100.0)
PLATELETS: 308 10*3/uL (ref 150–400)
RBC: 4.36 MIL/uL (ref 3.87–5.11)
RDW: 14.4 % (ref 11.5–15.5)
WBC: 13.7 10*3/uL — AB (ref 4.0–10.5)

## 2016-08-13 NOTE — Patient Instructions (Signed)
Your procedure is scheduled on: 08/18/2016  Enter through the Main Entrance of [facility] at:  12:45 PM  Pick up the phone at the desk and dial [phone].  Call this number if you have problems the morning of surgery: [phone].  Remember: Do NOT eat food:  After Midnight 08/16/2016  Do NOT drink clear liquids after:  10:00 AM day of surgery  Take these medicines the morning of surgery with a SIP OF WATER:  Geodon  Stop ALL herbal medications at this time   Do NOT wear jewelry (body piercing), metal hair clips/bobby pins, make-up, or nail polish. Do NOT wear lotions, powders, or perfumes.  You may wear deodorant. Do NOT shave for 48 hours prior to surgery. Do NOT bring valuables to the hospital. Contacts, dentures, or bridgework may not be worn into surgery.  Have a responsible adult drive you home and stay with you for 24 hours after your procedure

## 2016-08-13 NOTE — Pre-Procedure Instructions (Addendum)
Jessica Case was in a rush to get to her behavioral health appointment, was not able to keep her still and focused on her pre op appointment for worrying about missing her behavioral health appointment that she schedule for the same time as her pre op appointment.  Her pre op appointment was scheduled for 9:30 AM I was able to see her earlier than her original time.  She did not want me to read over her instructions with her.  Printed instructions given.

## 2016-08-18 ENCOUNTER — Ambulatory Visit (HOSPITAL_COMMUNITY): Payer: Medicaid Other | Admitting: Anesthesiology

## 2016-08-18 ENCOUNTER — Encounter (HOSPITAL_COMMUNITY)
Admission: RE | Disposition: A | Discharge status: Home / Self Care | Payer: Self-pay | Source: Ambulatory Visit | Attending: Obstetrics & Gynecology

## 2016-08-18 ENCOUNTER — Encounter (HOSPITAL_COMMUNITY): Payer: Self-pay

## 2016-08-18 ENCOUNTER — Ambulatory Visit (HOSPITAL_COMMUNITY)
Admission: RE | Admit: 2016-08-18 | Discharge: 2016-08-18 | Disposition: A | Discharge status: Home / Self Care | Payer: Medicaid Other | Source: Ambulatory Visit | Attending: Obstetrics & Gynecology | Admitting: Obstetrics & Gynecology

## 2016-08-18 DIAGNOSIS — D252 Subserosal leiomyoma of uterus: Secondary | ICD-10-CM | POA: Diagnosis not present

## 2016-08-18 DIAGNOSIS — F200 Paranoid schizophrenia: Secondary | ICD-10-CM | POA: Diagnosis not present

## 2016-08-18 DIAGNOSIS — L853 Xerosis cutis: Secondary | ICD-10-CM | POA: Diagnosis not present

## 2016-08-18 DIAGNOSIS — N736 Female pelvic peritoneal adhesions (postinfective): Secondary | ICD-10-CM | POA: Diagnosis not present

## 2016-08-18 DIAGNOSIS — F17210 Nicotine dependence, cigarettes, uncomplicated: Secondary | ICD-10-CM | POA: Insufficient documentation

## 2016-08-18 DIAGNOSIS — M1990 Unspecified osteoarthritis, unspecified site: Secondary | ICD-10-CM | POA: Insufficient documentation

## 2016-08-18 DIAGNOSIS — F419 Anxiety disorder, unspecified: Secondary | ICD-10-CM | POA: Insufficient documentation

## 2016-08-18 DIAGNOSIS — F1490 Cocaine use, unspecified, uncomplicated: Secondary | ICD-10-CM | POA: Insufficient documentation

## 2016-08-18 DIAGNOSIS — N7011 Chronic salpingitis: Secondary | ICD-10-CM | POA: Diagnosis not present

## 2016-08-18 DIAGNOSIS — R102 Pelvic and perineal pain: Secondary | ICD-10-CM | POA: Diagnosis present

## 2016-08-18 DIAGNOSIS — Z9889 Other specified postprocedural states: Secondary | ICD-10-CM | POA: Insufficient documentation

## 2016-08-18 DIAGNOSIS — Z79899 Other long term (current) drug therapy: Secondary | ICD-10-CM | POA: Diagnosis not present

## 2016-08-18 HISTORY — PX: MYOMECTOMY: SHX85

## 2016-08-18 HISTORY — PX: LAPAROTOMY: SHX154

## 2016-08-18 HISTORY — PX: LAPAROSCOPIC UNILATERAL SALPINGECTOMY: SHX5934

## 2016-08-18 HISTORY — PX: LAPAROSCOPY: SHX197

## 2016-08-18 LAB — RAPID URINE DRUG SCREEN, HOSP PERFORMED
Amphetamines: NOT DETECTED
BARBITURATES: NOT DETECTED
Benzodiazepines: NOT DETECTED
Cocaine: POSITIVE — AB
Opiates: NOT DETECTED
TETRAHYDROCANNABINOL: NOT DETECTED

## 2016-08-18 LAB — PREGNANCY, URINE: PREG TEST UR: NEGATIVE

## 2016-08-18 SURGERY — LAPAROSCOPY OPERATIVE
Anesthesia: General | Laterality: Left

## 2016-08-18 MED ORDER — BUPIVACAINE HCL (PF) 0.5 % IJ SOLN
INTRAMUSCULAR | Status: AC
  Filled 2016-08-18: qty 30

## 2016-08-18 MED ORDER — LIDOCAINE HCL (CARDIAC) 20 MG/ML IV SOLN
INTRAVENOUS | Status: DC | PRN
  Administered 2016-08-18 (×2): 50 mg via INTRAVENOUS

## 2016-08-18 MED ORDER — HYDROCODONE-ACETAMINOPHEN 5-325 MG PO TABS
1.0000 | ORAL_TABLET | Freq: Four times a day (QID) | ORAL | 0 refills | Status: DC | PRN
Start: 2016-08-18 — End: 2016-08-20

## 2016-08-18 MED ORDER — LIDOCAINE HCL (CARDIAC) 20 MG/ML IV SOLN
INTRAVENOUS | Status: AC
  Filled 2016-08-18: qty 5

## 2016-08-18 MED ORDER — FENTANYL CITRATE (PF) 100 MCG/2ML IJ SOLN: 25.0000 ug | INTRAMUSCULAR | Status: DC | PRN

## 2016-08-18 MED ORDER — LACTATED RINGERS IV SOLN
INTRAVENOUS | Status: DC
  Administered 2016-08-18 (×3): via INTRAVENOUS

## 2016-08-18 MED ORDER — PROPOFOL 10 MG/ML IV BOLUS
INTRAVENOUS | Status: DC | PRN
  Administered 2016-08-18: 150 mg via INTRAVENOUS
  Administered 2016-08-18 (×2): 50 mg via INTRAVENOUS
  Administered 2016-08-18: 100 mg via INTRAVENOUS
  Administered 2016-08-18: 50 mg via INTRAVENOUS

## 2016-08-18 MED ORDER — BUPIVACAINE HCL (PF) 0.5 % IJ SOLN
INTRAMUSCULAR | Status: DC | PRN
  Administered 2016-08-18: 60 mL

## 2016-08-18 MED ORDER — MIDAZOLAM HCL 2 MG/2ML IJ SOLN
INTRAMUSCULAR | Status: AC
  Filled 2016-08-18: qty 2

## 2016-08-18 MED ORDER — SCOPOLAMINE 1 MG/3DAYS TD PT72
MEDICATED_PATCH | TRANSDERMAL | Status: AC
  Filled 2016-08-18: qty 1

## 2016-08-18 MED ORDER — PROPOFOL 10 MG/ML IV BOLUS
INTRAVENOUS | Status: AC
  Filled 2016-08-18: qty 40

## 2016-08-18 MED ORDER — GLYCOPYRROLATE 0.2 MG/ML IJ SOLN
INTRAMUSCULAR | Status: DC | PRN
  Administered 2016-08-18: 0.4 mg via INTRAVENOUS

## 2016-08-18 MED ORDER — IBUPROFEN 800 MG PO TABS: 800.0000 mg | ORAL_TABLET | Freq: Three times a day (TID) | ORAL | 1 refills | Status: DC | PRN

## 2016-08-18 MED ORDER — LACTATED RINGERS IR SOLN
Status: DC | PRN
  Administered 2016-08-18: 3000 mL

## 2016-08-18 MED ORDER — NEOSTIGMINE METHYLSULFATE 10 MG/10ML IV SOLN
INTRAVENOUS | Status: DC | PRN
  Administered 2016-08-18: 3 mg via INTRAVENOUS

## 2016-08-18 MED ORDER — VASOPRESSIN 20 UNIT/ML IV SOLN
INTRAVENOUS | Status: AC
  Filled 2016-08-18: qty 1

## 2016-08-18 MED ORDER — PROPOFOL 10 MG/ML IV BOLUS
INTRAVENOUS | Status: AC
  Filled 2016-08-18: qty 20

## 2016-08-18 MED ORDER — SODIUM CHLORIDE 0.9 % IJ SOLN
INTRAMUSCULAR | Status: AC
  Filled 2016-08-18: qty 100

## 2016-08-18 MED ORDER — ROCURONIUM BROMIDE 100 MG/10ML IV SOLN
INTRAVENOUS | Status: DC | PRN
  Administered 2016-08-18: 40 mg via INTRAVENOUS
  Administered 2016-08-18 (×2): 10 mg via INTRAVENOUS

## 2016-08-18 MED ORDER — DEXAMETHASONE SODIUM PHOSPHATE 4 MG/ML IJ SOLN
INTRAMUSCULAR | Status: AC
  Filled 2016-08-18: qty 1

## 2016-08-18 MED ORDER — FENTANYL CITRATE (PF) 100 MCG/2ML IJ SOLN
INTRAMUSCULAR | Status: AC
  Filled 2016-08-18: qty 2

## 2016-08-18 MED ORDER — ROCURONIUM BROMIDE 100 MG/10ML IV SOLN
INTRAVENOUS | Status: AC
  Filled 2016-08-18: qty 1

## 2016-08-18 MED ORDER — FENTANYL CITRATE (PF) 100 MCG/2ML IJ SOLN
INTRAMUSCULAR | Status: DC | PRN
  Administered 2016-08-18 (×3): 50 ug via INTRAVENOUS
  Administered 2016-08-18: 100 ug via INTRAVENOUS
  Administered 2016-08-18: 50 ug via INTRAVENOUS

## 2016-08-18 MED ORDER — CEFAZOLIN SODIUM-DEXTROSE 2-4 GM/100ML-% IV SOLN
2.0000 g | Freq: Once | INTRAVENOUS | Status: AC
  Administered 2016-08-18: 2 g via INTRAVENOUS
  Filled 2016-08-18: qty 100

## 2016-08-18 MED ORDER — MIDAZOLAM HCL 5 MG/5ML IJ SOLN
INTRAMUSCULAR | Status: DC | PRN
  Administered 2016-08-18: 2 mg via INTRAVENOUS

## 2016-08-18 MED ORDER — PROMETHAZINE HCL 25 MG/ML IJ SOLN: 6.2500 mg | INTRAMUSCULAR | Status: DC | PRN

## 2016-08-18 MED ORDER — ONDANSETRON HCL 4 MG/2ML IJ SOLN
INTRAMUSCULAR | Status: AC
  Filled 2016-08-18: qty 2

## 2016-08-18 MED ORDER — SODIUM CHLORIDE 0.9 % IJ SOLN
INTRAMUSCULAR | Status: DC | PRN
  Administered 2016-08-18: 10 mL

## 2016-08-18 MED ORDER — SODIUM CHLORIDE 0.9 % IJ SOLN
INTRAMUSCULAR | Status: AC
  Filled 2016-08-18: qty 10

## 2016-08-18 MED ORDER — FENTANYL CITRATE (PF) 250 MCG/5ML IJ SOLN
INTRAMUSCULAR | Status: AC
  Filled 2016-08-18: qty 5

## 2016-08-18 MED ORDER — DEXAMETHASONE SODIUM PHOSPHATE 4 MG/ML IJ SOLN
INTRAMUSCULAR | Status: DC | PRN
  Administered 2016-08-18: 4 mg via INTRAVENOUS

## 2016-08-18 MED ORDER — SCOPOLAMINE 1 MG/3DAYS TD PT72
1.0000 | MEDICATED_PATCH | TRANSDERMAL | Status: DC
  Administered 2016-08-18: 1.5 mg via TRANSDERMAL

## 2016-08-18 SURGICAL SUPPLY — 43 items
APL SRG 38 LTWT LNG FL B (MISCELLANEOUS) ×3
APPLICATOR ARISTA FLEXITIP XL (MISCELLANEOUS) ×3 IMPLANT
BAG SPEC RTRVL LRG 6X4 10 (ENDOMECHANICALS)
CABLE HIGH FREQUENCY MONO STRZ (ELECTRODE) IMPLANT
CATH ROBINSON RED A/P 16FR (CATHETERS) IMPLANT
CLOTH BEACON ORANGE TIMEOUT ST (SAFETY) ×5 IMPLANT
COVER BACK TABLE 60X90IN (DRAPES) ×3 IMPLANT
DRSG OPSITE POSTOP 3X4 (GAUZE/BANDAGES/DRESSINGS) IMPLANT
DURAPREP 26ML APPLICATOR (WOUND CARE) ×5 IMPLANT
GLOVE BIO SURGEON STRL SZ7 (GLOVE) ×10 IMPLANT
GLOVE BIOGEL PI IND STRL 7.0 (GLOVE) ×6 IMPLANT
GLOVE BIOGEL PI INDICATOR 7.0 (GLOVE) ×4
GOWN STRL REUS W/TWL LRG LVL3 (GOWN DISPOSABLE) ×10 IMPLANT
GOWN STRL REUS W/TWL XL LVL3 (GOWN DISPOSABLE) ×5 IMPLANT
HEMOSTAT ARISTA ABSORB 3G PWDR (MISCELLANEOUS) ×3 IMPLANT
MANIPULATOR UTERINE 4.5 ZUMI (MISCELLANEOUS) ×5 IMPLANT
NDL SPNL 22GX7 QUINCKE BK (NEEDLE) IMPLANT
NEEDLE INSUFFLATION 120MM (ENDOMECHANICALS) ×5 IMPLANT
NEEDLE SPNL 22GX7 QUINCKE BK (NEEDLE) ×5 IMPLANT
NS IRRIG 1000ML POUR BTL (IV SOLUTION) ×5 IMPLANT
PACK LAPAROSCOPY BASIN (CUSTOM PROCEDURE TRAY) ×5 IMPLANT
PACK TRENDGUARD 450 HYBRID PRO (MISCELLANEOUS) ×1 IMPLANT
PACK TRENDGUARD 600 HYBRD PROC (MISCELLANEOUS) IMPLANT
POUCH SPECIMEN RETRIEVAL 10MM (ENDOMECHANICALS) IMPLANT
PROTECTOR NERVE ULNAR (MISCELLANEOUS) ×10 IMPLANT
SCISSORS LAP 5X35 DISP (ENDOMECHANICALS) ×3 IMPLANT
SET IRRIG TUBING LAPAROSCOPIC (IRRIGATION / IRRIGATOR) ×3 IMPLANT
SHEARS HARMONIC ACE PLUS 36CM (ENDOMECHANICALS) ×5 IMPLANT
SLEEVE XCEL OPT CAN 5 100 (ENDOMECHANICALS) ×6 IMPLANT
SUT VIC AB 0 CT1 27 (SUTURE) ×5
SUT VIC AB 0 CT1 27XBRD ANBCTR (SUTURE) ×1 IMPLANT
SUT VIC AB 4-0 KS 27 (SUTURE) ×3 IMPLANT
SUT VICRYL 0 UR6 27IN ABS (SUTURE) IMPLANT
SUT VICRYL 4-0 PS2 18IN ABS (SUTURE) ×7 IMPLANT
SYR 5ML LL (SYRINGE) ×5 IMPLANT
SYR CONTROL 10ML LL (SYRINGE) ×6 IMPLANT
TOWEL OR 17X24 6PK STRL BLUE (TOWEL DISPOSABLE) ×10 IMPLANT
TRAY FOLEY CATH SILVER 14FR (SET/KITS/TRAYS/PACK) ×3 IMPLANT
TRENDGUARD 450 HYBRID PRO PACK (MISCELLANEOUS) ×5
TRENDGUARD 600 HYBRID PROC PK (MISCELLANEOUS)
TROCAR OPTI TIP 5M 100M (ENDOMECHANICALS) ×5 IMPLANT
TROCAR XCEL DIL TIP R 11M (ENDOMECHANICALS) ×2 IMPLANT
WARMER LAPAROSCOPE (MISCELLANEOUS) ×5 IMPLANT

## 2016-08-18 NOTE — Anesthesia Postprocedure Evaluation (Signed)
Anesthesia Post Note  Patient: Jessica Case  Procedure(s) Performed: Procedure(s) (LRB): LAPAROSCOPY OPERATIVE LAPAROTOMY MYOMECTOMY LAPAROSCOPIC UNILATERAL SALPINGECTOMY (Left)  Patient location during evaluation: PACU Anesthesia Type: General Level of consciousness: awake and alert Pain management: pain level controlled Vital Signs Assessment: post-procedure vital signs reviewed and stable Respiratory status: spontaneous breathing, nonlabored ventilation, respiratory function stable and patient connected to nasal cannula oxygen Cardiovascular status: blood pressure returned to baseline and stable Postop Assessment: no signs of nausea or vomiting Anesthetic complications: no     Last Vitals:  Vitals:   08/18/16 1615 08/18/16 1645  BP: (!) 142/99 (!) 143/86  Pulse: 71 75  Resp: 14 16  Temp:  36.7 C    Last Pain:  Vitals:   08/18/16 1645  TempSrc:   PainSc: 3    Pain Goal: Patients Stated Pain Goal: 3 (08/18/16 1645)               Nilda Simmer

## 2016-08-18 NOTE — Op Note (Signed)
08/18/2016  3:16 PM  PATIENT:  Jessica Case  35 y.o. female  PRE-OPERATIVE DIAGNOSIS:  cpt 58700 - Hydrosalpinx; pelvic  pain  POST-OPERATIVE DIAGNOSIS:  cpt 58700 - Hydrosalpinx; pelvic pain; fibroid uterus; pedunculated fibroid  PROCEDURE:  Procedure(s) with comments: LAPAROSCOPY OPERATIVE LAPAROTOMY - mini laparotomy MYOMECTOMY LAPAROSCOPIC UNILATERAL SALPINGECTOMY (Left)  SURGEON:  Surgeon(s) and Role:    * Lavonia Drafts, MD - Primary    * Aletha Halim, MD - Assisting  ANESTHESIA:   general  EBL:  Total I/O In: 1000 [I.V.:1000] Out: 150 [Urine:100; Blood:50]  BLOOD ADMINISTERED:none  DRAINS: none   LOCAL MEDICATIONS USED:  MARCAINE     SPECIMEN:  Source of Specimen:  left fallopian tube; pedunculated fibroid  DISPOSITION OF SPECIMEN:  PATHOLOGY  COUNTS:  YES  TOURNIQUET:  * No tourniquets in log *  DICTATION: .Note written in EPIC  PLAN OF CARE: Discharge to home after PACU  PATIENT DISPOSITION:  PACU - hemodynamically stable.   Delay start of Pharmacological VTE agent (>24hrs) due to surgical blood loss or risk of bleeding: not applicable  Complications: none immediate  INDICATIONS: 35 y.o. G2P0110 at Unknown here with the preoperative diagnoses as listed above.  Please refer to preoperative notes for more details. Patient was counseled regarding need for laparoscopic salpingectomy. Risks of surgery including bleeding which may require transfusion or reoperation, infection, injury to bowel or other surrounding organs, need for additional procedures including laparotomy and other postoperative/anesthesia complications were explained to patient.  Written informed consent was obtained.  FINDINGS: Dilated left fallopian tube containing hydrosalpinx. Small uterus, right fallopian tube with adhesion to the uterus and side wall.  Large pedunculated fibroid posteriosly, subserosal fibroid on the post right side of the uterus. Normal right ovary and  left ovary.  PROCEDURE IN DETAIL:  The patient was taken to the operating room where general anesthesia was administered and was found to be adequate.  She was placed in the dorsal lithotomy position, and was prepped and draped in a sterile manner.  A Foley catheter was inserted into her bladder and attached to constant drainage and a uterine manipulator was then advanced into the uterus .  After an adequate timeout was performed, attention was then turned to the patient's abdomen where a 5-mm skin incision was made on the umbilical fold.  The Veress needle was carefully introduced into the peritoneal cavity at a 45-degree angle into the abdominal wall.  Intraperitoneal placement was confirmed by drop in intraabdominal pressure with insufflation of carbon dioxide gas.  Adequate pneumoperitoneum was obtained, and the 5-mm trocar and sleeve were then advanced without difficulty into the abdomen where intraabdominal placement was confirmed by the laparoscope. A survey of the patient's pelvis and abdomen revealed the findings as above.  Two 5-mm left lower quadrant ports were placed under direct visualization.  Attention was then turned to the left fallopian tube which was grasped and ligated from the underlying mesosalpinx and uterine attachment using the Harmonic scalpel instrument.  Good hemostasis was noted. Attention was then directed to the pedunculated fibroid which was transected using the Harmonic scalpel.  Excellent hemostasis was noted.  Due to the size of this fibroid a small laparotomy incision was made to remove the fibroid.  Using a scalpel a small incision was made in the skin in a transverse fashion making a small Pfannenstiel incision.  This incision was carried down to the fascia using the scalpel. The fascia was incised in the midline and this incision was extended bilaterally  using the Mayo scissors. The peritoneum was identified, picked up and incised with the scalpel. The fibroid was grasped  with a laparoscopic single tooth tenaculum from the 26mm port and brought to the level of the lower incision.  The fibroid was grasped through the incision with a towl clamp and removed in its entirety.  The fallopian tube was also removed through this incision.   The fascia was closed with 0 vicryl and the skin was closed with 4-0 vicryl.  Steristrips and benzoin were placed across this incision.  The camera was then reintroduced in tot he pelvis and the pelvis was irrigated copiously.  Arista was  placed on the posterior of the uterus and the left adnexa. Hemostasis was assured. All instruments were removed from the patients abd and pelvis. The port sites were closed using 3-0 vicryl and Dermabond.  60cc total of .5% Marcaine was injected into the ports sites.   The patient tolerated the procedure well. There were no complications during this case.  Sponge, lap, needle and instrument counts were correct.     The patient will be discharged to home as per PACU criteria. She did received ancef 2 grams IV post op due to the mini laparotomy for infection prophylaxis.  Routine postoperative instructions given.  She was prescribed Percocet and Ibuprofen.   She will follow up in the clinic in about 2 weeks for postoperative evaluation.  Banesa Tristan L. Harraway-Smith, M.D., [facility]

## 2016-08-18 NOTE — Discharge Instructions (Signed)
Uterine Fibroids Uterine fibroids are tissue masses (tumors) that can develop in the womb (uterus). They are also called leiomyomas. This type of tumor is not cancerous (benign) and does not spread to other parts of the body outside of the pelvic area, which is between the hip bones. Occasionally, fibroids may develop in the fallopian tubes, in the cervix, or on the support structures (ligaments) that surround the uterus. You can have one or many fibroids. Fibroids can vary in size, weight, and where they grow in the uterus. Some can become quite large. Most fibroids do not require medical treatment. CAUSES A fibroid can develop when a single uterine cell keeps growing (replicating). Most cells in the human body have a control mechanism that keeps them from replicating without control. SIGNS AND SYMPTOMS Symptoms may include:   Heavy bleeding during your period.  Bleeding or spotting between periods.  Pelvic pain and pressure.  Bladder problems, such as needing to urinate more often (urinary frequency) or urgently.  Inability to reproduce offspring (infertility).  Miscarriages. DIAGNOSIS Uterine fibroids are diagnosed through a physical exam. Your health care provider may feel the lumpy tumors during a pelvic exam. Ultrasonography and an MRI may be done to determine the size, location, and number of fibroids. TREATMENT Treatment may include:  Watchful waiting. This involves getting the fibroid checked by your health care provider to see if it grows or shrinks. Follow your health care provider's recommendations for how often to have this checked.  Hormone medicines. These can be taken by mouth or given through an intrauterine device (IUD).  Surgery.  Removing the fibroids (myomectomy) or the uterus (hysterectomy).  Removing blood supply to the fibroids (uterine artery embolization). If fibroids interfere with your fertility and you want to become pregnant, your health care provider  may recommend having the fibroids removed.  HOME CARE INSTRUCTIONS  Keep all follow-up visits as directed by your health care provider. This is important.  Take medicines only as directed by your health care provider.  If you were prescribed a hormone treatment, take the hormone medicines exactly as directed.  Do not take aspirin, because it can cause bleeding.  Ask your health care provider about taking iron pills and increasing the amount of dark green, leafy vegetables in your diet. These actions can help to boost your blood iron levels, which may be affected by heavy menstrual bleeding.  Pay close attention to your period and tell your health care provider about any changes, such as:  Increased blood flow that requires you to use more pads or tampons than usual per month.  A change in the number of days that your period lasts per month.  A change in symptoms that are associated with your period, such as abdominal cramping or back pain. SEEK MEDICAL CARE IF:  You have pelvic pain, back pain, or abdominal cramps that cannot be controlled with medicines.  You have an increase in bleeding between and during periods.  You soak tampons or pads in a half hour or less.  You feel lightheaded, extra tired, or weak. SEEK IMMEDIATE MEDICAL CARE IF:  You faint.  You have a sudden increase in pelvic pain.   This information is not intended to replace advice given to you by your health care provider. Make sure you discuss any questions you have with your health care provider.   Document Released: 10/02/2000 Document Revised: 10/26/2014 Document Reviewed: 04/03/2014 Elsevier Interactive Patient Education 2016 [place] After Refer to this sheet  in the next few weeks. These instructions provide you with information on caring for yourself after your procedure. Your health care provider may also give you more specific instructions. Your treatment has been planned  according to current medical practices, but problems sometimes occur. Call your health care provider if you have any problems or questions after your procedure. WHAT TO EXPECT AFTER THE PROCEDURE After your procedure, it is typical to have the following:  Abdominal pain that can be controlled with pain medicine.  Vaginal spotting.  Tiredness. HOME CARE INSTRUCTIONS  Get plenty of rest and sleep.  Only take over-the-counter or prescription medicines as directed by your health care provider.  Keep incision areas clean and dry. Remove or change any bandages (dressings) only as directed by your health care provider.  You may resume your regular diet. Eat a well-balanced diet.  Drink enough fluids to keep your urine clear or pale yellow.  Limit exercise and activities as directed by your health care provider. Do not lift anything heavier than 5 lb (2.3 kg) until your health care provider approves.  Do not drive until your health care provider approves.  Do not have sexual intercourse until your health care provider says it is okay.  Take your temperature twice a day for the first week. Write those temperatures down.  Follow up with your health care provider as directed. SEEK MEDICAL CARE IF:  You have pain when you urinate.  You see pus coming out of the incision, or the incision is separating.  You have increasing abdominal pain.  You have swelling or redness in the incision area.  You develop a rash.  You feel lightheaded.  You have pain that is not controlled with medicine. SEEK IMMEDIATE MEDICAL CARE IF:  You develop a fever.  You have increasing abdominal pain.  You develop chest or leg pain.  You develop shortness of breath.  You pass out.   This information is not intended to replace advice given to you by your health care provider. Make sure you discuss any questions you have with your health care provider.   Document Released: 01/09/2011 Document  Revised: 10/26/2014 Document Reviewed: 03/29/2013 Elsevier Interactive Patient Education 2016 [place] Anest[facility] Home Care Instructions  Activity: Get plenty of rest for the remainder of the day. A responsible adult should stay with you for 24 hours following the procedure.  For the next 24 hours, DO NOT: -Drive a car -Paediatric nurse -Drink alcoholic beverages -Take any medication unless instructed by your physician -Make any legal decisions or sign important papers.  Meals: Start with liquid foods such as gelatin or soup. Progress to regular foods as tolerated. Avoid greasy, spicy, heavy foods. If nausea and/or vomiting occur, drink only clear liquids until the nausea and/or vomiting subsides. Call your physician if vomiting continues.  Special Instructions/Symptoms: Your throat may feel dry or sore from the anesthesia or the breathing tube placed in your throat during surgery. If this causes discomfort, gargle with warm salt water. The discomfort should disappear within 24 hours.  If you had a scopolamine patch placed behind your ear for the management of post- operative nausea and/or vomiting:  1. The medication in the patch is effective for 72 hours, after which it should be removed.  Wrap patch in a tissue and discard in the trash. Wash hands thoroughly with soap and water. 2. You may remove the patch earlier than 72 hours if you experience unpleasant side effects which may  include dry mouth, dizziness or visual disturbances. 3. Avoid touching the patch. Wash your hands with soap and water after contact with the patch.

## 2016-08-18 NOTE — Transfer of Care (Signed)
Immediate Anesthesia Transfer of Care Note  Patient: Jessica Case  Procedure(s) Performed: Procedure(s) with comments: LAPAROSCOPY OPERATIVE LAPAROTOMY - mini laparotomy MYOMECTOMY LAPAROSCOPIC UNILATERAL SALPINGECTOMY (Left)  Patient Location: PACU  Anesthesia Type:General  Level of Consciousness: awake, alert  and oriented  Airway & Oxygen Therapy: Patient Spontanous Breathing and Patient connected to nasal cannula oxygen  Post-op Assessment: Report given to RN and Post -op Vital signs reviewed and stable  Post vital signs: Reviewed and stable  Last Vitals:  Vitals:   08/18/16 1132  BP: (!) 127/98  Pulse: (!) 54  Resp: 20  Temp: 36.9 C    Last Pain:  Vitals:   08/18/16 1132  TempSrc: Oral  PainSc: 5       Patients Stated Pain Goal: 3 ([id number])  Complications: No apparent anesthesia complications

## 2016-08-18 NOTE — Brief Op Note (Signed)
08/18/2016  3:16 PM  PATIENT:  Jessica Case  35 y.o. female  PRE-OPERATIVE DIAGNOSIS:  cpt 58700 - Hydrosalpinx; pelvic  pain  POST-OPERATIVE DIAGNOSIS:  cpt 58700 - Hydrosalpinx; pelvic pain; fibroid uterus; pedunculated fibroid  PROCEDURE:  Procedure(s) with comments: LAPAROSCOPY OPERATIVE LAPAROTOMY - mini laparotomy MYOMECTOMY LAPAROSCOPIC UNILATERAL SALPINGECTOMY (Left)  SURGEON:  Surgeon(s) and Role:    * Lavonia Drafts, MD - Primary    * Aletha Halim, MD - Assisting  ANESTHESIA:   general  EBL:  Total I/O In: 1000 [I.V.:1000] Out: 150 [Urine:100; Blood:50]  BLOOD ADMINISTERED:none  DRAINS: none   LOCAL MEDICATIONS USED:  MARCAINE     SPECIMEN:  Source of Specimen:  left fallopian tube; pedunculated fibroid  DISPOSITION OF SPECIMEN:  PATHOLOGY  COUNTS:  YES  TOURNIQUET:  * No tourniquets in log *  DICTATION: .Note written in EPIC  PLAN OF CARE: Discharge to home after PACU  PATIENT DISPOSITION:  PACU - hemodynamically stable.   Delay start of Pharmacological VTE agent (>24hrs) due to surgical blood loss or risk of bleeding: not applicable  Complications: none immediate  Jessica Case, M.D., [facility] June

## 2016-08-18 NOTE — Anesthesia Preprocedure Evaluation (Addendum)
Anesthesia Evaluation  Patient identified by MRN, date of birth, ID band Patient awake    Reviewed: Allergy & Precautions, NPO status , Patient's Chart, lab work & pertinent test results  History of Anesthesia Complications Negative for: history of anesthetic complications  Airway Mallampati: II  TM Distance: >3 FB Neck ROM: Full    Dental  (+) Poor Dentition, Dental Advisory Given   Pulmonary neg shortness of breath, neg sleep apnea, neg COPD, Recent URI , Resolved, Current Smoker,    Pulmonary exam normal breath sounds clear to auscultation       Cardiovascular negative cardio ROS   Rhythm:Regular Rate:Normal     Neuro/Psych  Headaches, neg Seizures PSYCHIATRIC DISORDERS (claustrophobia) Anxiety Schizophrenia    GI/Hepatic negative GI ROS, (+)     Substance abuse: UDS positive today.  cocaine use,   Endo/Other  negative endocrine ROS  Renal/GU negative Renal ROS     Musculoskeletal  (+) Arthritis ,   Abdominal   Peds  Hematology negative hematology ROS (+)   Anesthesia Other Findings   Reproductive/Obstetrics                            Anesthesia Physical Anesthesia Plan  ASA: II  Anesthesia Plan: General   Post-op Pain Management:    Induction: Intravenous  Airway Management Planned: Oral ETT  Additional Equipment:   Intra-op Plan:   Post-operative Plan: Extubation in OR  Informed Consent: I have reviewed the patients History and Physical, chart, labs and discussed the procedure including the risks, benefits and alternatives for the proposed anesthesia with the patient or authorized representative who has indicated his/her understanding and acceptance.   Dental advisory given  Plan Discussed with: CRNA  Anesthesia Plan Comments: (UDS positive for cocaine today. Patient states her last use was 3 days ago. She does not appear acutely intoxicated. I discussed the risks  of heart attack, cardiac arrest, and death. The patient affirmed again that her last use was 3 days ago and that she is willing to accept the risks.  Risks of general anesthesia discussed including, but not limited to, sore throat, hoarse voice, chipped/damaged teeth, injury to vocal cords, nausea and vomiting, allergic reactions, lung infection, heart attack, stroke, and death. All questions answered. )       Anesthesia Quick Evaluation

## 2016-08-18 NOTE — H&P (Signed)
Preoperative History and Physical  Jessica Case is a 35 y.o. G2P0110 here for surgical management of hydrosalpinx.   Proposed surgery:  laparoscopy with removal of fallopian tube with hydrosalpinx.  Past Medical History:  Diagnosis Date  . Arthritis   . Claustrophobia   . Cold    currently - no meds  . Fallopian tube abscess   . Fibroids   . Paranoid schizophrenia ([place])   . SVD (spontaneous vaginal delivery)    x 1   Past Surgical History:  Procedure Laterality Date  . BUNIONECTOMY    . bunyonectomy Bilateral    x 1  . TOOTH EXTRACTION     OB History    Gravida Para Term Preterm AB Living   2 1 0 1 1 0   SAB TAB Ectopic Multiple Live Births   1 0           Patient denies any cervical dysplasia or STIs. Prescriptions Prior to Admission  Medication Sig Dispense Refill Last Dose  . hydrOXYzine (VISTARIL) 50 MG capsule Take 50 mg by mouth daily.   Past Week at Unknown time  . traZODone (DESYREL) 150 MG tablet Take 150 mg by mouth at bedtime.   Past Week at Unknown time  . ziprasidone (GEODON) 60 MG capsule Take 60 mg by mouth daily.   Past Week at Unknown time  . azithromycin (ZITHROMAX) 250 MG tablet Take 2 today then 1 each day thereafer (Patient not taking: Reported on 08/04/2016) 6 tablet 0 Not Taking at Unknown time  . benzonatate (TESSALON) 200 MG capsule Take 1 capsule (200 mg total) by mouth 2 (two) times daily as needed for cough. (Patient not taking: Reported on 08/04/2016) 20 capsule 0 Not Taking at Unknown time  . ibuprofen (ADVIL,MOTRIN) 800 MG tablet Take 1 tablet (800 mg total) by mouth every 8 (eight) hours as needed for moderate pain. (Patient not taking: Reported on 08/03/2016) 30 tablet 0 Not Taking  . loratadine (CLARITIN) 10 MG tablet Take 1 tablet (10 mg total) by mouth daily. (Patient not taking: Reported on 08/04/2016) 30 tablet 11 Not Taking at Unknown time    Allergies  Allergen Reactions  . Citrus Itching  . Strawberry Extract Hives  .  Toradol [Ketorolac Tromethamine] Itching   Social History:   reports that she has been smoking Cigarettes.  She has a 2.30 pack-year smoking history. She has never used smokeless tobacco. She reports that she drinks alcohol. She reports that she uses drugs, including Marijuana and "Crack" cocaine. Family History  Problem Relation Age of Onset  . Hypertension Mother     Review of Systems: Noncontributory  PHYSICAL EXAM: Blood pressure (!) 127/98, pulse (!) 54, temperature 98.4 F (36.9 C), temperature source Oral, resp. rate 20, SpO2 98 %. General appearance - alert, well appearing, and in no distress Chest - clear to auscultation, no wheezes, rales or rhonchi, symmetric air entry Heart - normal rate and regular rhythm Abdomen - soft, nontender, nondistended, no masses or organomegaly Pelvic - examination not indicated Extremities - peripheral pulses normal, no pedal edema, no clubbing or cyanosis  Labs: Results for orders placed or performed during the hospital encounter of 08/18/16 (from the past 336 hour(s))  Rapid urine drug screen (hospital performed)   Collection Time: 08/18/16 11:15 AM  Result Value Ref Range   Opiates NONE DETECTED NONE DETECTED   Cocaine POSITIVE (A) NONE DETECTED   Benzodiazepines NONE DETECTED NONE DETECTED   Amphetamines NONE DETECTED NONE DETECTED  Tetrahydrocannabinol NONE DETECTED NONE DETECTED   Barbiturates NONE DETECTED NONE DETECTED  Pregnancy, urine   Collection Time: 08/18/16 11:15 AM  Result Value Ref Range   Preg Test, Ur NEGATIVE NEGATIVE  Results for orders placed or performed during the hospital encounter of 08/13/16 (from the past 336 hour(s))  CBC   Collection Time: 08/13/16  8:50 AM  Result Value Ref Range   WBC 13.7 (H) 4.0 - 10.5 K/uL   RBC 4.36 3.87 - 5.11 MIL/uL   Hemoglobin 13.5 12.0 - 15.0 g/dL   HCT 38.1 36.0 - 46.0 %   MCV 87.4 78.0 - 100.0 fL   MCH 31.0 26.0 - 34.0 pg   MCHC 35.4 30.0 - 36.0 g/dL   RDW 14.4 11.5 -  15.5 %   Platelets 308 150 - 400 K/uL    Imaging Studies: No results found.  Assessment: Patient Active Problem List   Diagnosis Date Noted  . Hydrosalpinx 04/13/2016  . Pelvic pain in female 04/07/2016  . Xerosis of skin 04/06/2016  . Status post left foot surgery 01/28/2016  . Swelling 01/28/2016  . HAV (hallux abducto valgus) 01/28/2016  . Bunion, right 09/09/2015  . Status post right foot surgery 09/09/2015  . Tobacco abuse 09/04/2015  . Migraine with status migrainosus 08/15/2015  . Recurrent occipital headache 07/18/2015  . Vision decreased 07/18/2015  . Allergic rhinitis 07/18/2015  . Bunion of great toe 11/15/2014  . Pain due to dental caries 07/12/2014    Plan: Patient will undergo surgical management with laparoscopy with removal of fallopian tube with hydrosalpinx.   The risks of surgery were discussed in detail with the patient including but not limited to: bleeding which may require transfusion or reoperation; infection which may require antibiotics; injury to surrounding organs which may involve bowel, bladder, ureters ; need for additional procedures including laparoscopy or laparotomy; thromboembolic phenomenon, surgical site problems and other postoperative/anesthesia complications. Likelihood of success in alleviating the patient's condition was discussed. Routine postoperative instructions will be reviewed with the patient and her family in detail after surgery.  The patient concurred with the proposed plan, giving informed written consent for the surgery.  Patient has been NPO since last night she will remain NPO for procedure.  Anesthesia and OR aware.  Preoperative prophylactic antibiotics and SCDs ordered on call to the OR.  To OR when ready.  Breydan Shillingburg L. Ihor Dow, M.D., [facility] 08/18/2016 12:36 PM

## 2016-08-19 ENCOUNTER — Encounter (HOSPITAL_COMMUNITY): Payer: Self-pay | Admitting: Obstetrics & Gynecology

## 2016-08-19 NOTE — Addendum Note (Signed)
Addendum  created 08/19/16 1327 by Laverle Hobby, CRNA   Charge Capture section accepted

## 2016-08-20 ENCOUNTER — Inpatient Hospital Stay (HOSPITAL_COMMUNITY)
Admission: AD | Admit: 2016-08-20 | Discharge: 2016-08-20 | Disposition: A | Discharge status: Home / Self Care | Payer: Medicaid Other | Source: Ambulatory Visit | Attending: Obstetrics & Gynecology | Admitting: Obstetrics & Gynecology

## 2016-08-20 ENCOUNTER — Encounter (HOSPITAL_COMMUNITY): Payer: Self-pay

## 2016-08-20 ENCOUNTER — Ambulatory Visit: Payer: Medicaid Other

## 2016-08-20 DIAGNOSIS — F200 Paranoid schizophrenia: Secondary | ICD-10-CM | POA: Diagnosis not present

## 2016-08-20 DIAGNOSIS — Z4801 Encounter for change or removal of surgical wound dressing: Secondary | ICD-10-CM

## 2016-08-20 DIAGNOSIS — G8918 Other acute postprocedural pain: Secondary | ICD-10-CM | POA: Insufficient documentation

## 2016-08-20 DIAGNOSIS — F17210 Nicotine dependence, cigarettes, uncomplicated: Secondary | ICD-10-CM | POA: Insufficient documentation

## 2016-08-20 DIAGNOSIS — K5900 Constipation, unspecified: Secondary | ICD-10-CM | POA: Diagnosis not present

## 2016-08-20 LAB — URINALYSIS, ROUTINE W REFLEX MICROSCOPIC
Bilirubin Urine: NEGATIVE
Glucose, UA: NEGATIVE mg/dL
Ketones, ur: NEGATIVE mg/dL
Leukocytes, UA: NEGATIVE
NITRITE: NEGATIVE
PROTEIN: NEGATIVE mg/dL
Specific Gravity, Urine: 1.02 (ref 1.005–1.030)
pH: 7 (ref 5.0–8.0)

## 2016-08-20 LAB — URINE MICROSCOPIC-ADD ON: WBC UA: NONE SEEN WBC/hpf (ref 0–5)

## 2016-08-20 MED ORDER — HYDROMORPHONE HCL 4 MG PO TABS: 4.0000 mg | ORAL_TABLET | Freq: Four times a day (QID) | ORAL | 0 refills | Status: DC | PRN

## 2016-08-20 MED ORDER — HYDROCODONE-ACETAMINOPHEN 5-325 MG PO TABS
1.0000 | ORAL_TABLET | Freq: Once | ORAL | Status: AC
  Administered 2016-08-20: 1 via ORAL
  Filled 2016-08-20: qty 1

## 2016-08-20 NOTE — Discharge Instructions (Signed)
Salpingectomy, Care After Refer to this sheet in the next few weeks. These instructions provide you with information on caring for yourself after your procedure. Your health care provider may also give you more specific instructions. Your treatment has been planned according to current medical practices, but problems sometimes occur. Call your health care provider if you have any problems or questions after your procedure. WHAT TO EXPECT AFTER THE PROCEDURE After your procedure, it is typical to have the following:  Abdominal pain that can be controlled with pain medicine.  Vaginal spotting.  Tiredness. HOME CARE INSTRUCTIONS  Get plenty of rest and sleep.  Only take over-the-counter or prescription medicines as directed by your health care provider.  Keep incision areas clean and dry. Remove or change any bandages (dressings) only as directed by your health care provider.  You may resume your regular diet. Eat a well-balanced diet.  Drink enough fluids to keep your urine clear or pale yellow.  Limit exercise and activities as directed by your health care provider. Do not lift anything heavier than 5 lb (2.3 kg) until your health care provider approves.  Do not drive until your health care provider approves.  Do not have sexual intercourse until your health care provider says it is okay.  Take your temperature twice a day for the first week. Write those temperatures down.  Follow up with your health care provider as directed. SEEK MEDICAL CARE IF:  You have pain when you urinate.  You see pus coming out of the incision, or the incision is separating.  You have increasing abdominal pain.  You have swelling or redness in the incision area.  You develop a rash.  You feel lightheaded.  You have pain that is not controlled with medicine. SEEK IMMEDIATE MEDICAL CARE IF:  You develop a fever.  You have increasing abdominal pain.  You develop chest or leg pain.  You  develop shortness of breath.  You pass out.   This information is not intended to replace advice given to you by your health care provider. Make sure you discuss any questions you have with your health care provider.   Document Released: 01/09/2011 Document Revised: 10/26/2014 Document Reviewed: 03/29/2013 Elsevier Interactive Patient Education 2016 [employer].    Constipation, Adult Constipation is when a person has fewer than three bowel movements a week, has difficulty having a bowel movement, or has stools that are dry, hard, or larger than normal. As people grow older, constipation is more common. A low-fiber diet, not taking in enough fluids, and taking certain medicines may make constipation worse.  CAUSES   Certain medicines, such as antidepressants, pain medicine, iron supplements, antacids, and water pills.   Certain diseases, such as diabetes, irritable bowel syndrome (IBS), thyroid disease, or depression.   Not drinking enough water.   Not eating enough fiber-rich foods.   Stress or travel.   Lack of physical activity or exercise.   Ignoring the urge to have a bowel movement.   Using laxatives too much.  SIGNS AND SYMPTOMS   Having fewer than three bowel movements a week.   Straining to have a bowel movement.   Having stools that are hard, dry, or larger than normal.   Feeling full or bloated.   Pain in the lower abdomen.   Not feeling relief after having a bowel movement.  DIAGNOSIS  Your health care provider will take a medical history and perform a physical exam. Further testing may be done for severe constipation.  Some tests may include:  A barium enema X-ray to examine your rectum, colon, and, sometimes, your small intestine.   A sigmoidoscopy to examine your lower colon.   A colonoscopy to examine your entire colon. TREATMENT  Treatment will depend on the severity of your constipation and what is causing it. Some dietary  treatments include drinking more fluids and eating more fiber-rich foods. Lifestyle treatments may include regular exercise. If these diet and lifestyle recommendations do not help, your health care provider may recommend taking over-the-counter laxative medicines to help you have bowel movements. Prescription medicines may be prescribed if over-the-counter medicines do not work.  HOME CARE INSTRUCTIONS   Eat foods that have a lot of fiber, such as fruits, vegetables, whole grains, and beans.  Limit foods high in fat and processed sugars, such as french fries, hamburgers, cookies, candies, and soda.   A fiber supplement may be added to your diet if you cannot get enough fiber from foods.   Drink enough fluids to keep your urine clear or pale yellow.   Exercise regularly or as directed by your health care provider.   Go to the restroom when you have the urge to go. Do not hold it.   Only take over-the-counter or prescription medicines as directed by your health care provider. Do not take other medicines for constipation without talking to your health care provider first.  [place] IF:   You have bright red blood in your stool.   Your constipation lasts for more than 4 days or gets worse.   You have abdominal or rectal pain.   You have thin, pencil-like stools.   You have unexplained weight loss. MAKE SURE YOU:   Understand these instructions.  Will watch your condition.  Will get help right away if you are not doing well or get worse.   This information is not intended to replace advice given to you by your health care provider. Make sure you discuss any questions you have with your health care provider.   Document Released: 07/03/2004 Document Revised: 10/26/2014 Document Reviewed: 07/17/2013 Elsevier Interactive Patient Education [employer].

## 2016-08-20 NOTE — MAU Provider Note (Signed)
History     CSN: [id number]  Arrival date and time: 08/20/16 1429   First Provider Initiated Contact with Patient 08/20/16 1617      Chief Complaint  Patient presents with  . Post-op Problem   HPI Jessica Case is a 35 y.o. G2P0110 female who presents 2 days s/p myomectomy/salpingectomy due to abdominal pain. Patient was discharged home with 2 days worth of pain medication which she states provided mild relief. Currently reports lower abdominal pain that she rates 10/10. Pain is worse around area of incision. States her incision has bled through her dressing. Last took norco at 9 am this morning. Denies fever/chills, n/v/d. States she has not had a BM since the morning of her surgery but has been passing flatus. Is not taking stool softener.    Past Medical History:  Diagnosis Date  . Arthritis   . Claustrophobia   . Cold    currently - no meds  . Fallopian tube abscess   . Fibroids   . Paranoid schizophrenia ([place])   . SVD (spontaneous vaginal delivery)    x 1    Past Surgical History:  Procedure Laterality Date  . BUNIONECTOMY    . bunyonectomy Bilateral    x 1  . LAPAROSCOPIC UNILATERAL SALPINGECTOMY Left 08/18/2016   Procedure: LAPAROSCOPIC UNILATERAL SALPINGECTOMY;  Surgeon: Lavonia Drafts, MD;  Location: [facility];  Service: Gynecology;  Laterality: Left;  . LAPAROSCOPY  08/18/2016   Procedure: LAPAROSCOPY OPERATIVE;  Surgeon: Lavonia Drafts, MD;  Location: [facility];  Service: Gynecology;;  . LAPAROTOMY  08/18/2016   Procedure: LAPAROTOMY;  Surgeon: Lavonia Drafts, MD;  Location: [facility];  Service: Gynecology;;  mini laparotomy  . MYOMECTOMY  08/18/2016   Procedure: MYOMECTOMY;  Surgeon: Lavonia Drafts, MD;  Location: [facility];  Service: Gynecology;;  . TOOTH EXTRACTION      Family History  Problem Relation Age of Onset  . Hypertension Mother     Social History  Substance Use Topics  . Smoking status: Current Every Day Smoker   Packs/day: 0.10    Years: 23.00    Types: Cigarettes  . Smokeless tobacco: Never Used  . Alcohol use 0.0 oz/week     Comment: beer 40 oz /week    Allergies:  Allergies  Allergen Reactions  . Citrus Hives and Itching  . Food Hives, Itching and Other (See Comments)    Pt states that she is allergic to strawberries.    . Toradol [Ketorolac Tromethamine] Hives and Itching    Prescriptions Prior to Admission  Medication Sig Dispense Refill Last Dose  . HYDROcodone-acetaminophen (NORCO/VICODIN) 5-325 MG tablet Take 1-2 tablets by mouth every 6 (six) hours as needed for moderate pain. 15 tablet 0 08/20/2016 at 1430  . hydrOXYzine (VISTARIL) 50 MG capsule Take 50 mg by mouth every 6 (six) hours as needed for anxiety.    Past Week at Unknown time  . loratadine (CLARITIN) 10 MG tablet Take 10 mg by mouth daily as needed for allergies.   Past Week at Unknown time  . traZODone (DESYREL) 150 MG tablet Take 150 mg by mouth at bedtime as needed for sleep.    Past Week at Unknown time  . ziprasidone (GEODON) 60 MG capsule Take 60 mg by mouth at bedtime.    Past Week at Unknown time  . ibuprofen (ADVIL,MOTRIN) 800 MG tablet Take 1 tablet (800 mg total) by mouth every 8 (eight) hours as needed for moderate pain. (Patient not taking: Reported on 08/20/2016) 30 tablet  1 Not Taking at Unknown time    Review of Systems  Constitutional: Negative.   Gastrointestinal: Positive for abdominal pain and constipation. Negative for diarrhea, nausea and vomiting.  Genitourinary: Negative for dysuria.       + vaginal bleeding   Physical Exam   Blood pressure 142/79, pulse 64, temperature 98.9 F (37.2 C), temperature source Oral, resp. rate 18.  Physical Exam  Nursing note and vitals reviewed. Constitutional: She is oriented to person, place, and time. She appears well-developed and well-nourished. No distress.  HENT:  Head: Normocephalic and atraumatic.  Eyes: Conjunctivae are normal. Right eye exhibits no  discharge. Left eye exhibits no discharge. No scleral icterus.  Neck: Normal range of motion.  Cardiovascular: Normal rate, regular rhythm and normal heart sounds.   No murmur heard. Respiratory: Effort normal and breath sounds normal. No respiratory distress. She has no wheezes.  GI: Soft. Bowel sounds are normal. She exhibits no distension and no mass. There is tenderness (surrounding area of laparotomy ) in the suprapubic area. There is no rebound and no guarding.  Honeycomb dressing & steri strips removed. Incision ~6 cm in length; well approximated with no drainage at this time. Area gently cleaned; steri strips & abd pad applied.   Neurological: She is alert and oriented to person, place, and time.  Skin: Skin is warm and dry. She is not diaphoretic.  Psychiatric: She has a normal mood and affect. Her behavior is normal. Judgment and thought content normal.    MAU Course  Procedures Results for orders placed or performed during the hospital encounter of 08/20/16 (from the past 48 hour(s))  Urinalysis, Routine w reflex microscopic (not at [facility])     Status: Abnormal   Collection Time: 08/20/16  3:00 PM  Result Value Ref Range   Color, Urine YELLOW YELLOW   APPearance CLEAR CLEAR   Specific Gravity, Urine 1.020 1.005 - 1.030   pH 7.0 5.0 - 8.0   Glucose, UA NEGATIVE NEGATIVE mg/dL   Hgb urine dipstick LARGE (A) NEGATIVE   Bilirubin Urine NEGATIVE NEGATIVE   Ketones, ur NEGATIVE NEGATIVE mg/dL   Protein, ur NEGATIVE NEGATIVE mg/dL   Nitrite NEGATIVE NEGATIVE   Leukocytes, UA NEGATIVE NEGATIVE  Urine microscopic-add on     Status: Abnormal   Collection Time: 08/20/16  3:00 PM  Result Value Ref Range   Squamous Epithelial / LPF 0-5 (A) NONE SEEN   WBC, UA NONE SEEN 0 - 5 WBC/hpf   RBC / HPF 0-5 0 - 5 RBC/hpf   Bacteria, UA RARE (A) NONE SEEN    MDM Pt has longstanding hx of cocaine & narcotic use. Probable that patient has tolerance to pain medication. Discussed with patient  that I would prescribed pain medication to get her through the weekend as she just had surgery but that she would need to contact her surgeon if symptoms didn't improve.  Educated pt regarding tx of constipation as that can make her symptoms worse & constipation can be caused/worsened by the pain medication No evidence of emergent condition at this time such as infection or bowel obstruction.  Assessment and Plan  A: 1. Post-op pain   2. Change or removal of surgical wound dressing   3. Constipation, unspecified constipation type    P: Discharge home D/c norco Rx dilaudid  Take colace BID -- pt states she has some at home Increase water & fiber intake Discussed reasons to return to MAU F/u with MD  Jorje Guild 08/20/2016,  5:01 PM

## 2016-08-20 NOTE — MAU Note (Signed)
Pt had surgery on Tuesday, is in severe pain, needs dressing changed, having heavy bleeding.  Wants refill on her hydrocodone.

## 2016-08-26 ENCOUNTER — Encounter: Payer: Self-pay | Admitting: Obstetrics & Gynecology

## 2016-08-26 ENCOUNTER — Ambulatory Visit (INDEPENDENT_AMBULATORY_CARE_PROVIDER_SITE_OTHER): Payer: Medicaid Other | Admitting: Obstetrics & Gynecology

## 2016-08-26 VITALS — BP 130/89 | HR 55 | Ht 61.0 in | Wt 138.0 lb

## 2016-08-26 DIAGNOSIS — Z9889 Other specified postprocedural states: Secondary | ICD-10-CM

## 2016-08-26 DIAGNOSIS — R300 Dysuria: Secondary | ICD-10-CM

## 2016-08-26 LAB — POCT URINALYSIS DIP (DEVICE)
Bilirubin Urine: NEGATIVE
Glucose, UA: NEGATIVE mg/dL
HGB URINE DIPSTICK: NEGATIVE
KETONES UR: NEGATIVE mg/dL
Leukocytes, UA: NEGATIVE
Nitrite: NEGATIVE
PH: 8.5 — AB (ref 5.0–8.0)
PROTEIN: NEGATIVE mg/dL
Specific Gravity, Urine: 1.015 (ref 1.005–1.030)
Urobilinogen, UA: 0.2 mg/dL (ref 0.0–1.0)

## 2016-08-26 NOTE — Progress Notes (Signed)
History:  35 y.o. G2P0110 here today for post op check. Pt reports increased pain. She was seen in the MAU for pain because she ran out of her pain meds.  She reports pain with urination.  She denies fever or chills. She reports that she is eating well and has no adverse GI sx.     The following portions of the patient's history were reviewed and updated as appropriate: allergies, current medications, past family history, past medical history, past social history, past surgical history and problem list.  Review of Systems:  Pertinent items are noted in HPI.   Objective:  Physical Exam Blood pressure 130/89, pulse (!) 55, height 5\' 1"  (1.549 m), weight 138 lb (62.6 kg), last menstrual period 07/28/2016. Gen: NAD Abd: Soft, nontender and nondistended. Port sites healing well.  The lower transverse incision is healing without difficulty.  + BS   Pelvic: Normal appearing external genitalia; normal appearing vaginal mucosa and cervix.  Normal discharge.  Small uterus, no other palpable masses, no uterine or adnexal tenderness  08/18/2016 Diagnosis Fallopian tube, left, with fibroid - BENIGN SMOOTH MUSCLE, CONSISTENT WITH LEIOMYOMA. - BENIGN FALLOPIAN TUBE WITH HYDROSALPINX AND HYDROSALPINGITIS. - THERE IS NO EVIDENCE OF MALIGNANCY.  UA;  Neg nitrates or leuk  Assessment & Plan:  1 1/2  week post op s/p Left salpingectmoy and myomectomy. Pt reports pain.  She is doing well post op  Plan: Motrin 800mg  tid with food prn. Pt counseled not to take more than 1 pill at a time rec heating pad prn F/u in 4 weeks or sooner prn  Ronda Kazmi L. Harraway-Smith, M.D., Cherlynn June

## 2016-08-26 NOTE — Patient Instructions (Signed)
Uterine Fibroids Uterine fibroids are tissue masses (tumors) that can develop in the womb (uterus). They are also called leiomyomas. This type of tumor is not cancerous (benign) and does not spread to other parts of the body outside of the pelvic area, which is between the hip bones. Occasionally, fibroids may develop in the fallopian tubes, in the cervix, or on the support structures (ligaments) that surround the uterus. You can have one or many fibroids. Fibroids can vary in size, weight, and where they grow in the uterus. Some can become quite large. Most fibroids do not require medical treatment. CAUSES A fibroid can develop when a single uterine cell keeps growing (replicating). Most cells in the human body have a control mechanism that keeps them from replicating without control. SIGNS AND SYMPTOMS Symptoms may include:   Heavy bleeding during your period.  Bleeding or spotting between periods.  Pelvic pain and pressure.  Bladder problems, such as needing to urinate more often (urinary frequency) or urgently.  Inability to reproduce offspring (infertility).  Miscarriages. DIAGNOSIS Uterine fibroids are diagnosed through a physical exam. Your health care provider may feel the lumpy tumors during a pelvic exam. Ultrasonography and an MRI may be done to determine the size, location, and number of fibroids. TREATMENT Treatment may include:  Watchful waiting. This involves getting the fibroid checked by your health care provider to see if it grows or shrinks. Follow your health care provider's recommendations for how often to have this checked.  Hormone medicines. These can be taken by mouth or given through an intrauterine device (IUD).  Surgery.  Removing the fibroids (myomectomy) or the uterus (hysterectomy).  Removing blood supply to the fibroids (uterine artery embolization). If fibroids interfere with your fertility and you want to become pregnant, your health care provider  may recommend having the fibroids removed.  HOME CARE INSTRUCTIONS  Keep all follow-up visits as directed by your health care provider. This is important.  Take medicines only as directed by your health care provider.  If you were prescribed a hormone treatment, take the hormone medicines exactly as directed.  Do not take aspirin, because it can cause bleeding.  Ask your health care provider about taking iron pills and increasing the amount of dark green, leafy vegetables in your diet. These actions can help to boost your blood iron levels, which may be affected by heavy menstrual bleeding.  Pay close attention to your period and tell your health care provider about any changes, such as:  Increased blood flow that requires you to use more pads or tampons than usual per month.  A change in the number of days that your period lasts per month.  A change in symptoms that are associated with your period, such as abdominal cramping or back pain. SEEK MEDICAL CARE IF:  You have pelvic pain, back pain, or abdominal cramps that cannot be controlled with medicines.  You have an increase in bleeding between and during periods.  You soak tampons or pads in a half hour or less.  You feel lightheaded, extra tired, or weak. SEEK IMMEDIATE MEDICAL CARE IF:  You faint.  You have a sudden increase in pelvic pain.   This information is not intended to replace advice given to you by your health care provider. Make sure you discuss any questions you have with your health care provider.   Document Released: 10/02/2000 Document Revised: 10/26/2014 Document Reviewed: 04/03/2014 Elsevier Interactive Patient Education 2016 [employer]vier Inc[employer]  

## 2016-09-24 ENCOUNTER — Ambulatory Visit (INDEPENDENT_AMBULATORY_CARE_PROVIDER_SITE_OTHER): Payer: Medicaid Other | Admitting: Obstetrics & Gynecology

## 2016-09-24 VITALS — BP 147/100 | HR 76 | Wt 135.6 lb

## 2016-09-24 DIAGNOSIS — Z9889 Other specified postprocedural states: Secondary | ICD-10-CM

## 2016-09-24 NOTE — Progress Notes (Signed)
History:  35 y.o. G2P0110 here today for 5 week post op check after myomectomy of pedunculated fibroid and salpingectomy.  She denies pain or problems.  The following portions of the patient's history were reviewed and updated as appropriate: allergies, current medications, past family history, past medical history, past social history, past surgical history and problem list.  Review of Systems:  Pertinent items are noted in HPI.   Objective:  Physical Exam Last menstrual period 07/28/2016. Gen: NAD Abd: Soft, nontender and nondistended Pelvic: Normal appearing external genitalia; normal appearing vaginal mucosa and cervix.  Normal discharge.  Small uterus, no other palpable masses, no uterine or adnexal tenderness  Labs and Imaging 08/18/2016 Diagnosis Fallopian tube, left, with fibroid - BENIGN SMOOTH MUSCLE, CONSISTENT WITH LEIOMYOMA. - BENIGN FALLOPIAN TUBE WITH HYDROSALPINX AND HYDROSALPINGITIS. - THERE IS NO EVIDENCE OF MALIGNANCY  Assessment & Plan:  5 week post op check.  BP elevated- pt reports only intermittently taking her meds. She confirms cont cocaine use. F/u in 1 year for annual or sooner prn  Mckenna Gamm L. Harraway-Smith, M.D., FACOG      Pt was given pics from the laparoscopic portion of the surgery.  Adrena Nakamura L. Harraway-Smith, M.D., [place]nn June

## 2016-10-08 ENCOUNTER — Other Ambulatory Visit (HOSPITAL_COMMUNITY)
Admission: RE | Admit: 2016-10-08 | Discharge: 2016-10-08 | Disposition: A | Discharge status: Home / Self Care | Payer: Medicaid Other | Source: Ambulatory Visit | Attending: Family Medicine | Admitting: Family Medicine

## 2016-10-08 ENCOUNTER — Ambulatory Visit: Payer: Medicaid Other | Attending: Family Medicine | Admitting: Family Medicine

## 2016-10-08 ENCOUNTER — Encounter: Payer: Self-pay | Admitting: Family Medicine

## 2016-10-08 VITALS — BP 133/91 | HR 96 | Temp 98.8°F | Ht 61.0 in | Wt 138.4 lb

## 2016-10-08 DIAGNOSIS — L292 Pruritus vulvae: Secondary | ICD-10-CM | POA: Diagnosis not present

## 2016-10-08 DIAGNOSIS — F1410 Cocaine abuse, uncomplicated: Secondary | ICD-10-CM

## 2016-10-08 DIAGNOSIS — I159 Secondary hypertension, unspecified: Secondary | ICD-10-CM | POA: Insufficient documentation

## 2016-10-08 DIAGNOSIS — F17210 Nicotine dependence, cigarettes, uncomplicated: Secondary | ICD-10-CM | POA: Diagnosis not present

## 2016-10-08 DIAGNOSIS — F1490 Cocaine use, unspecified, uncomplicated: Secondary | ICD-10-CM

## 2016-10-08 DIAGNOSIS — N760 Acute vaginitis: Secondary | ICD-10-CM | POA: Diagnosis not present

## 2016-10-08 DIAGNOSIS — N898 Other specified noninflammatory disorders of vagina: Secondary | ICD-10-CM

## 2016-10-08 DIAGNOSIS — L298 Other pruritus: Secondary | ICD-10-CM

## 2016-10-08 DIAGNOSIS — I158 Other secondary hypertension: Secondary | ICD-10-CM | POA: Diagnosis not present

## 2016-10-08 DIAGNOSIS — F14988 Cocaine use, unspecified with other cocaine-induced disorder: Secondary | ICD-10-CM | POA: Insufficient documentation

## 2016-10-08 DIAGNOSIS — B9689 Other specified bacterial agents as the cause of diseases classified elsewhere: Secondary | ICD-10-CM

## 2016-10-08 DIAGNOSIS — I10 Essential (primary) hypertension: Secondary | ICD-10-CM | POA: Insufficient documentation

## 2016-10-08 MED ORDER — FLUCONAZOLE 150 MG PO TABS: 150.0000 mg | ORAL_TABLET | ORAL | 0 refills | Status: DC

## 2016-10-08 MED ORDER — CLONIDINE HCL 0.1 MG PO TABS: 0.1000 mg | ORAL_TABLET | Freq: Three times a day (TID) | ORAL | 3 refills | Status: DC

## 2016-10-08 NOTE — Patient Instructions (Signed)
Jessica Case was seen today for hypertension.  Diagnoses and all orders for this visit:  Other secondary hypertension -     cloNIDine (CATAPRES) 0.1 MG tablet; Take 1 tablet (0.1 mg total) by mouth 3 (three) times daily.  Vaginal itching -     Cervicovaginal ancillary only   Stop cocaine use   Dr. Adrian Blackwater

## 2016-10-08 NOTE — Assessment & Plan Note (Signed)
Normal exam with scant discharge  Diflucan ordered Wet prep, tricg, Gc/chlam done

## 2016-10-08 NOTE — Progress Notes (Signed)
Pt is here to follow up on HTN. Pt states that she has a yeast infection.

## 2016-10-08 NOTE — Progress Notes (Signed)
Subjective:  Patient ID: Jessica Case, female    DOB: Feb 18, 1981  Age: 35 y.o. MRN: [id number]  CC: Hypertension   HPI Jessica Case presents for   1.HTN: using cocaine. Has vision changes when BP high. No CP, SOB or leg swelling.  2. Vaginal itching: x 1 days. No discharge or pelvic pain. Suspects she has a yeast infection.   Social History  Substance Use Topics  . Smoking status: Current Every Day Smoker    Packs/day: 0.10    Years: 23.00    Types: Cigarettes  . Smokeless tobacco: Never Used  . Alcohol use 0.0 oz/week     Comment: beer 40 oz /week    Outpatient Medications Prior to Visit  Medication Sig Dispense Refill  . hydrOXYzine (VISTARIL) 50 MG capsule Take 50 mg by mouth every 6 (six) hours as needed for anxiety.     Marland Kitchen loratadine (CLARITIN) 10 MG tablet Take 10 mg by mouth daily as needed for allergies.    . traZODone (DESYREL) 150 MG tablet Take 150 mg by mouth at bedtime as needed for sleep.     . ziprasidone (GEODON) 60 MG capsule Take 60 mg by mouth at bedtime.      No facility-administered medications prior to visit.     ROS Review of Systems  Constitutional: Negative for chills and fever.  Eyes: Positive for visual disturbance.  Respiratory: Negative for shortness of breath.   Cardiovascular: Negative for chest pain.  Gastrointestinal: Negative for abdominal pain and blood in stool.  Musculoskeletal: Negative for arthralgias and back pain.  Skin: Negative for rash.  Allergic/Immunologic: Negative for immunocompromised state.  Hematological: Negative for adenopathy. Does not bruise/bleed easily.  Psychiatric/Behavioral: Negative for dysphoric mood and suicidal ideas.    Objective:  BP (!) 133/91 (BP Location: Left Arm, Patient Position: Sitting, Cuff Size: Small)   Pulse 96   Temp 98.8 F (37.1 C) (Oral)   Ht 5\' 1"  (1.549 m)   Wt 138 lb 6.4 oz (62.8 kg)   LMP 09/18/2016   SpO2 100%   BMI 26.15 kg/m   BP/Weight 10/08/2016 09/24/2016  [id number]  Systolic BP [id number] [id number]  Diastolic BP 91 [id number] 89  Wt. (Lbs) 138.4 135.6 138  BMI 26.15 25.62 26.07    Physical Exam  Constitutional: She appears well-developed and well-nourished. No distress.  Cardiovascular: Normal rate, regular rhythm, normal heart sounds and intact distal pulses.   Pulmonary/Chest: Effort normal and breath sounds normal.  Genitourinary: Uterus normal. Pelvic exam was performed with patient prone. There is no rash, tenderness or lesion on the right labia. There is no rash, tenderness or lesion on the left labia. Cervix exhibits no motion tenderness, no discharge and no friability. Vaginal discharge (scant white ) found.  Musculoskeletal: She exhibits no edema.  Lymphadenopathy:       Right: No inguinal adenopathy present.       Left: No inguinal adenopathy present.  Skin: Skin is warm and dry. No rash noted.     Assessment & Plan:   There are no diagnoses linked to this encounter. Jessica Case was seen today for hypertension.  Diagnoses and all orders for this visit:  Other secondary hypertension -     cloNIDine (CATAPRES) 0.1 MG tablet; Take 1 tablet (0.1 mg total) by mouth 3 (three) times daily.  Vaginal itching -     Cancel: Cervicovaginal ancillary only -     Cervicovaginal ancillary only -     fluconazole (DIFLUCAN) 150 MG tablet;  Take 1 tablet (150 mg total) by mouth every 3 (three) days.   No orders of the defined types were placed in this encounter.   Follow-up: Return in about 4 weeks (around 11/05/2016) for HTN .   Boykin Nearing MD

## 2016-10-08 NOTE — Assessment & Plan Note (Signed)
HTN related to cocaine use Advised cocaine avoidance Clonidine ordered

## 2016-10-09 LAB — CERVICOVAGINAL ANCILLARY ONLY
CHLAMYDIA, DNA PROBE: NEGATIVE
Neisseria Gonorrhea: NEGATIVE
WET PREP (BD AFFIRM): POSITIVE — AB

## 2016-10-15 MED ORDER — METRONIDAZOLE 500 MG PO TABS: 500.0000 mg | ORAL_TABLET | Freq: Two times a day (BID) | ORAL | 0 refills | Status: DC

## 2016-10-15 NOTE — Addendum Note (Signed)
Addended by: Boykin Nearing on: 10/15/2016 08:43 AM   Modules accepted: Orders

## 2016-10-16 ENCOUNTER — Telehealth: Payer: Self-pay

## 2016-10-16 NOTE — Telephone Encounter (Signed)
Pt was called and a VM was left informing pt to return phone call for lab results. 

## 2016-10-20 ENCOUNTER — Telehealth: Payer: Self-pay

## 2016-10-20 NOTE — Telephone Encounter (Addendum)
Patient fully hipaa verified.  Rn advised per Dr. Adrian Blackwater: Negative gonorrhea and chlamydia Yeast and BV on wet prep Bacterial vaginosis (BV) on wet prep This is not an STD This is an overgrowth of bacteria that can be treated with flagyl 500 mg twice daily followed by difucan 150 mg once to prevent yeast.  I have sent these to your pharmacy Do not mix flagyl with alcohol as this will cause stomach upset   Patient verbalized understanding.  Priscille Heidelberg, RN, BSN

## 2016-10-20 NOTE — Telephone Encounter (Signed)
Patient returning call for lab results.  Please follow up

## 2016-10-26 ENCOUNTER — Telehealth: Payer: Self-pay

## 2016-10-26 NOTE — Telephone Encounter (Signed)
PT WAS GIVEN RESULTS BY KIMBERLY ON 1/2

## 2016-11-16 NOTE — Telephone Encounter (Signed)
Did not call pt

## 2016-12-24 ENCOUNTER — Ambulatory Visit: Payer: Medicaid Other

## 2017-03-03 ENCOUNTER — Encounter: Payer: Self-pay | Admitting: Family Medicine

## 2017-05-11 ENCOUNTER — Ambulatory Visit: Payer: Medicaid Other | Attending: Family Medicine | Admitting: Family Medicine

## 2017-05-11 ENCOUNTER — Encounter: Payer: Self-pay | Admitting: Family Medicine

## 2017-05-11 VITALS — BP 150/92 | HR 69 | Temp 98.7°F | Ht 61.0 in | Wt 136.4 lb

## 2017-05-11 DIAGNOSIS — Z3200 Encounter for pregnancy test, result unknown: Secondary | ICD-10-CM

## 2017-05-11 DIAGNOSIS — Z79899 Other long term (current) drug therapy: Secondary | ICD-10-CM | POA: Diagnosis not present

## 2017-05-11 DIAGNOSIS — F17200 Nicotine dependence, unspecified, uncomplicated: Secondary | ICD-10-CM | POA: Insufficient documentation

## 2017-05-11 DIAGNOSIS — M79671 Pain in right foot: Secondary | ICD-10-CM | POA: Insufficient documentation

## 2017-05-11 DIAGNOSIS — K029 Dental caries, unspecified: Secondary | ICD-10-CM | POA: Insufficient documentation

## 2017-05-11 DIAGNOSIS — R05 Cough: Secondary | ICD-10-CM | POA: Insufficient documentation

## 2017-05-11 DIAGNOSIS — I158 Other secondary hypertension: Secondary | ICD-10-CM | POA: Insufficient documentation

## 2017-05-11 DIAGNOSIS — R052 Subacute cough: Secondary | ICD-10-CM

## 2017-05-11 LAB — POCT URINE PREGNANCY: PREG TEST UR: NEGATIVE

## 2017-05-11 MED ORDER — OMEPRAZOLE 40 MG PO CPDR: 40.0000 mg | DELAYED_RELEASE_CAPSULE | Freq: Every day | ORAL | 0 refills | Status: DC

## 2017-05-11 MED ORDER — CLONIDINE HCL 0.1 MG PO TABS: 0.1000 mg | ORAL_TABLET | Freq: Three times a day (TID) | ORAL | 3 refills | Status: DC

## 2017-05-11 NOTE — Assessment & Plan Note (Signed)
Dental referral placed.

## 2017-05-11 NOTE — Assessment & Plan Note (Signed)
A: suspect GERD vs smoker cough. No red flags P: Smoking cessation for 2 weeks  PPI for 1 month

## 2017-05-11 NOTE — Assessment & Plan Note (Signed)
Nighttime cramping pain No deformity No pain now Advised increase intake of fluids, cocaine avoidance Tylenol or ibuprofen with food for pain

## 2017-05-11 NOTE — Progress Notes (Addendum)
Subjective:  Patient ID: Jessica Case, female    DOB: 1981-03-28  Age: 36 y.o. MRN: [id number]  CC: Foot Pain and Cough   HPI Kasie Leccese has HTN, cocaine abuse she presents for   1. R medial foot pain: at night. Cramping sensation. No trauma. No pain in left foot. She is s/p bunion surgery in both feet.   2. Cough: in early AM. For one month. Productive of clear sputum. Associated with gagging and nausea. No fever or chills. No chest pain or shortness of breath. She smokes cheap cigars daily. She used cocaine twice in the last month. No known sick contacts.   3. Dental pain: she has cavities. Request dental referral. No gum swelling or fever.   4. HTN: she did not take clonidine today. Denies HA, CP, SOB and leg swelling.   5. ? Pregnancy: she reports her last period was about abnormal in that it was lighter than usual. She is sexually active. She smokes and drinks alcohol. She desires pregnancy in the future. She declines birth control.  Social History  Substance Use Topics  . Smoking status: Current Every Day Smoker    Packs/day: 0.10    Years: 23.00    Types: Cigarettes  . Smokeless tobacco: Never Used  . Alcohol use 0.0 oz/week     Comment: beer 40 oz /week   Outpatient Medications Prior to Visit  Medication Sig Dispense Refill  . cloNIDine (CATAPRES) 0.1 MG tablet Take 1 tablet (0.1 mg total) by mouth 3 (three) times daily. 90 tablet 3  . fluconazole (DIFLUCAN) 150 MG tablet Take 1 tablet (150 mg total) by mouth every 3 (three) days. 2 tablet 0  . hydrOXYzine (VISTARIL) 50 MG capsule Take 50 mg by mouth every 6 (six) hours as needed for anxiety.     Marland Kitchen loratadine (CLARITIN) 10 MG tablet Take 10 mg by mouth daily as needed for allergies.    . metroNIDAZOLE (FLAGYL) 500 MG tablet Take 1 tablet (500 mg total) by mouth 2 (two) times daily. 14 tablet 0  . traZODone (DESYREL) 150 MG tablet Take 150 mg by mouth at bedtime as needed for sleep.     . ziprasidone (GEODON)  60 MG capsule Take 60 mg by mouth at bedtime.      No facility-administered medications prior to visit.     ROS Review of Systems  Constitutional: Negative for chills and fever.  Eyes: Negative for visual disturbance.  Respiratory: Positive for cough. Negative for shortness of breath.   Cardiovascular: Negative for chest pain.  Gastrointestinal: Negative for abdominal pain and blood in stool.  Musculoskeletal: Positive for arthralgias. Negative for back pain.  Skin: Negative for rash.  Allergic/Immunologic: Negative for immunocompromised state.  Hematological: Negative for adenopathy. Does not bruise/bleed easily.  Psychiatric/Behavioral: Negative for dysphoric mood and suicidal ideas.    Objective:  BP (!) 150/92   Pulse 69   Temp 98.7 F (37.1 C) (Oral)   Ht 5\' 1"  (1.549 m)   Wt 136 lb 6.4 oz (61.9 kg)   SpO2 99%   BMI 25.77 kg/m   BP/Weight 05/11/2017 10/08/2016 40/06/8118  Systolic BP 147 829 562  Diastolic BP 92 91 130  Wt. (Lbs) 136.4 138.4 135.6  BMI 25.77 26.15 25.62   Physical Exam  Constitutional: She appears well-developed and well-nourished. No distress.  HENT:  Right Ear: Tympanic membrane, external ear and ear canal normal.  Left Ear: Tympanic membrane, external ear and ear canal normal.  Nose: No  mucosal edema.  Mouth/Throat: Oropharynx is clear and moist.    Cardiovascular: Normal rate, regular rhythm, normal heart sounds and intact distal pulses.   Pulmonary/Chest: Effort normal and breath sounds normal.  Genitourinary: Vagina normal and uterus normal. Pelvic exam was performed with patient supine. There is no rash, tenderness or lesion on the right labia. There is no rash, tenderness or lesion on the left labia. Cervix exhibits no motion tenderness, no discharge and no friability.  Musculoskeletal: She exhibits no edema.       Right foot: Normal.       Feet:  Lymphadenopathy:       Right: No inguinal adenopathy present.       Left: No inguinal  adenopathy present.  Skin: Skin is warm and dry. No rash noted.   U preg: negative   Assessment & Plan:  Jennyfer was seen today for foot pain and cough.  Diagnoses and all orders for this visit:  Possible pregnancy -     POCT urine pregnancy  Pain due to dental caries -     Ambulatory referral to Dentistry  Subacute cough -     omeprazole (PRILOSEC) 40 MG capsule; Take 1 capsule (40 mg total) by mouth daily before supper.  Right foot pain  Other secondary hypertension -     cloNIDine (CATAPRES) 0.1 MG tablet; Take 1 tablet (0.1 mg total) by mouth 3 (three) times daily.   There are no diagnoses linked to this encounter.  No orders of the defined types were placed in this encounter.   Follow-up: Return in about 8 weeks (around 07/06/2017) for cough.   Boykin Nearing MD

## 2017-05-11 NOTE — Assessment & Plan Note (Signed)
Non compliant with clonidine Using cocaine rarely Counseled against use Clonidine refilled

## 2017-05-11 NOTE — Patient Instructions (Addendum)
Jessica Case was seen today for foot pain and cough.  Diagnoses and all orders for this visit:  Possible pregnancy -     POCT urine pregnancy  Pain due to dental caries -     Ambulatory referral to Dentistry  Subacute cough -     omeprazole (PRILOSEC) 40 MG capsule; Take 1 capsule (40 mg total) by mouth daily before supper.  Right foot pain  Other secondary hypertension -     cloNIDine (CATAPRES) 0.1 MG tablet; Take 1 tablet (0.1 mg total) by mouth 3 (three) times daily.  increase intake of water Tylenol or ibuprofen with food for joint pains  I suspect smokers cough from cigars vs reflux Stop cigars for next 2 weeks If cough improves you know it is from cigars If cough does not improve start prilosec for 4 weeks   F/u in 6-8 weeks  months for cough   Dr. Adrian Blackwater

## 2017-09-06 ENCOUNTER — Ambulatory Visit: Payer: Self-pay | Admitting: Family Medicine

## 2017-09-07 ENCOUNTER — Ambulatory Visit: Payer: Medicaid Other | Attending: Family Medicine | Admitting: Family Medicine

## 2017-09-29 ENCOUNTER — Ambulatory Visit: Payer: Medicaid Other | Admitting: Family Medicine

## 2017-10-18 ENCOUNTER — Ambulatory Visit: Payer: Medicaid Other | Admitting: Family Medicine

## 2017-10-29 ENCOUNTER — Encounter: Payer: Self-pay | Admitting: Nurse Practitioner

## 2017-10-29 ENCOUNTER — Ambulatory Visit: Payer: Medicaid Other | Attending: Nurse Practitioner | Admitting: Nurse Practitioner

## 2017-10-29 VITALS — BP 143/90 | HR 87 | Temp 98.8°F | Ht 61.0 in | Wt 132.2 lb

## 2017-10-29 DIAGNOSIS — K5900 Constipation, unspecified: Secondary | ICD-10-CM | POA: Diagnosis not present

## 2017-10-29 DIAGNOSIS — F200 Paranoid schizophrenia: Secondary | ICD-10-CM | POA: Diagnosis not present

## 2017-10-29 DIAGNOSIS — Z0189 Encounter for other specified special examinations: Secondary | ICD-10-CM | POA: Diagnosis present

## 2017-10-29 DIAGNOSIS — R899 Unspecified abnormal finding in specimens from other organs, systems and tissues: Secondary | ICD-10-CM | POA: Diagnosis not present

## 2017-10-29 DIAGNOSIS — Z3200 Encounter for pregnancy test, result unknown: Secondary | ICD-10-CM | POA: Diagnosis not present

## 2017-10-29 DIAGNOSIS — I10 Essential (primary) hypertension: Secondary | ICD-10-CM | POA: Diagnosis present

## 2017-10-29 DIAGNOSIS — Z79899 Other long term (current) drug therapy: Secondary | ICD-10-CM | POA: Diagnosis not present

## 2017-10-29 MED ORDER — POLYETHYLENE GLYCOL 3350 17 GM/SCOOP PO POWD: 17.0000 g | Freq: Two times a day (BID) | ORAL | 1 refills | Status: DC | PRN

## 2017-10-29 MED ORDER — HYDROCHLOROTHIAZIDE 25 MG PO TABS: 25.0000 mg | ORAL_TABLET | Freq: Every day | ORAL | 3 refills | Status: DC

## 2017-10-29 NOTE — Patient Instructions (Addendum)

## 2017-10-29 NOTE — Progress Notes (Signed)
Assessment & Plan:  Jessica Case was seen today for establish care.  Diagnoses and all orders for this visit:  Essential hypertension -     hydrochlorothiazide (HYDRODIURIL) 25 MG tablet; Take 1 tablet (25 mg total) by mouth daily. Continue all antihypertensives as prescribed.  Remember to bring in your blood pressure log with you for your follow up appointment.  DASH/Mediterranean Diets are healthier choices for HTN.    Abnormal laboratory test -     CMP14+EGFR  Constipation, unspecified constipation type -     polyethylene glycol powder (GLYCOLAX/MIRALAX) powder; Take 17 g by mouth 2 (two) times daily as needed.  Encounter for pregnancy test, result unknown -     hCG, serum, qualitative    Patient has been counseled on age-appropriate routine health concerns for screening and prevention. These are reviewed and up-to-date. Referrals have been placed accordingly. Immunizations are up-to-date or declined.    Subjective:   Chief Complaint  Patient presents with  . Establish Care    Patient is here to establish care. Patient would like to get her blood pressure medication refills. Patient stated she would like powder medications Miralax for her stomach. Patient stated she would like to get her liver and kidney check.    HPI Jessica Case 37 y.o. female presents to office today to establish care. She appears to be very impatient. States "I don't want to be waiting all day". She paces around the room while I attempt to obtain HPI. She is requesting a pregnancy test today as well as for her liver and kidneys "to be checked out". She endorses amenorrhea x 1.5 months. She denies abdominal pain or any GU symptoms.  She has a history of fibroids. She is due for a PAP and needs to schedule. She is aware. She is not using any form of contraception at this time.   Paranoid Schizophrenia She stopped taking Geodon several months ago per her report. She did not like the way it made her feel. I  instructed her that she needs to follow back up with [facility] or [facility] [place] through [facility] for medication management as soon as possible.  Essential Hypertension She needs refills. She is not taking clonidine as prescribed. She has a history of substance abuse. I do not believe she will be adherent with taking a medication TID. Will switch clonidine to HCTZ. Hopefully will be more adherent with a once a day medication. She verbalizes understanding. Denies chest pain, shortness of breath, palpitations, lightheadedness, dizziness, headaches or BLE edema.  BP Readings from Last 3 Encounters:  10/30/17 131/87  10/29/17 (!) 143/90  05/11/17 (!) 150/92    Review of Systems  Constitutional: Negative for fever, malaise/fatigue and weight loss.  HENT: Negative.  Negative for nosebleeds.   Eyes: Negative.  Negative for blurred vision, double vision and photophobia.  Respiratory: Negative.  Negative for cough and shortness of breath.   Cardiovascular: Negative.  Negative for chest pain, palpitations and leg swelling.  Gastrointestinal: Positive for constipation. Negative for abdominal pain, diarrhea, heartburn, nausea and vomiting.  Genitourinary:       See hpi  Musculoskeletal: Negative.  Negative for myalgias.  Neurological: Negative.  Negative for dizziness, focal weakness, seizures and headaches.  Endo/Heme/Allergies: Positive for environmental allergies.  Psychiatric/Behavioral: Positive for hallucinations and substance abuse. Negative for suicidal ideas. The patient is nervous/anxious.     Past Medical History:  Diagnosis Date  . Arthritis   . Claustrophobia   . Cold  currently - no meds  . Fallopian tube abscess   . Fibroids   . Paranoid schizophrenia ([place])   . SVD (spontaneous vaginal delivery)    x 1    Past Surgical History:  Procedure Laterality Date  . BUNIONECTOMY    . bunyonectomy Bilateral    x 1  . LAPAROSCOPIC UNILATERAL SALPINGECTOMY Left 08/18/2016    Procedure: LAPAROSCOPIC UNILATERAL SALPINGECTOMY;  Surgeon: Lavonia Drafts, MD;  Location: [facility];  Service: Gynecology;  Laterality: Left;  . LAPAROSCOPY  08/18/2016   Procedure: LAPAROSCOPY OPERATIVE;  Surgeon: Lavonia Drafts, MD;  Location: [facility];  Service: Gynecology;;  . LAPAROTOMY  08/18/2016   Procedure: LAPAROTOMY;  Surgeon: Lavonia Drafts, MD;  Location: [facility];  Service: Gynecology;;  mini laparotomy  . MYOMECTOMY  08/18/2016   Procedure: MYOMECTOMY;  Surgeon: Lavonia Drafts, MD;  Location: [facility];  Service: Gynecology;;  . TOOTH EXTRACTION      Family History  Problem Relation Age of Onset  . Hypertension Mother     Social History Reviewed with no changes to be made today.   Outpatient Medications Prior to Visit  Medication Sig Dispense Refill  . hydrOXYzine (VISTARIL) 50 MG capsule Take 50 mg by mouth every 6 (six) hours as needed for anxiety.     Marland Kitchen loratadine (CLARITIN) 10 MG tablet Take 10 mg by mouth daily as needed for allergies.    Marland Kitchen omeprazole (PRILOSEC) 40 MG capsule Take 1 capsule (40 mg total) by mouth daily before supper. (Patient not taking: Reported on 10/29/2017) 30 capsule 0  . traZODone (DESYREL) 150 MG tablet Take 150 mg by mouth at bedtime as needed for sleep.     . ziprasidone (GEODON) 60 MG capsule Take 60 mg by mouth at bedtime.     . cloNIDine (CATAPRES) 0.1 MG tablet Take 1 tablet (0.1 mg total) by mouth 3 (three) times daily. (Patient not taking: Reported on 10/29/2017) 90 tablet 3   No facility-administered medications prior to visit.     Allergies  Allergen Reactions  . Citrus Hives and Itching  . Food Hives, Itching and Other (See Comments)    Pt states that she is allergic to strawberries.    . Toradol [Ketorolac Tromethamine] Hives and Itching       Objective:    BP (!) 143/90 (BP Location: Right Arm, Patient Position: Sitting, Cuff Size: Normal)   Pulse 87   Temp 98.8 F (37.1 C) (Oral)   Ht _0   (1.549 m)   Wt 132 lb 3.2 oz (60 kg)   SpO2 100%   BMI 24.98 kg/m  Wt Readings from Last 3 Encounters:  10/29/17 132 lb 3.2 oz (60 kg)  05/11/17 136 lb 6.4 oz (61.9 kg)  10/08/16 138 lb 6.4 oz (62.8 kg)    Physical Exam  Constitutional: She is oriented to person, place, and time. She appears well-developed and well-nourished.  HENT:  Head: Normocephalic.  Eyes: EOM are normal.  Neck: Normal range of motion.  Cardiovascular: Normal rate, regular rhythm and normal heart sounds.  Pulmonary/Chest: Effort normal.  Abdominal: Soft. Bowel sounds are normal.  Musculoskeletal: Normal range of motion.  Neurological: She is alert and oriented to person, place, and time.  Skin: Skin is warm and dry.  Psychiatric: Thought content normal. Her mood appears anxious. Her affect is inappropriate. Her speech is rapid and/or pressured. She is hyperactive.      Patient has been counseled extensively about nutrition and exercise as well as the importance  of adherence with medications and regular follow-up. The patient was given clear instructions to go to ER or return to medical center if symptoms don't improve, worsen or new problems develop. The patient verbalized understanding.   Follow-up: Return in about 4 weeks (around 11/26/2017) for BP recheck.   [name], FNP-BC [facility] [place], [place]   10/31/2017, 1:14 PM

## 2017-10-30 ENCOUNTER — Encounter (HOSPITAL_COMMUNITY): Payer: Self-pay

## 2017-10-30 ENCOUNTER — Inpatient Hospital Stay (HOSPITAL_COMMUNITY)
Admission: AD | Admit: 2017-10-30 | Discharge: 2017-10-30 | Discharge status: Against Medical Advice | Payer: Medicaid Other | Source: Ambulatory Visit | Attending: Obstetrics and Gynecology | Admitting: Obstetrics and Gynecology

## 2017-10-30 ENCOUNTER — Inpatient Hospital Stay (HOSPITAL_COMMUNITY): Payer: Medicaid Other

## 2017-10-30 DIAGNOSIS — R1031 Right lower quadrant pain: Secondary | ICD-10-CM

## 2017-10-30 DIAGNOSIS — F200 Paranoid schizophrenia: Secondary | ICD-10-CM | POA: Diagnosis not present

## 2017-10-30 DIAGNOSIS — F1010 Alcohol abuse, uncomplicated: Secondary | ICD-10-CM | POA: Insufficient documentation

## 2017-10-30 DIAGNOSIS — Z888 Allergy status to other drugs, medicaments and biological substances status: Secondary | ICD-10-CM | POA: Insufficient documentation

## 2017-10-30 DIAGNOSIS — Z9889 Other specified postprocedural states: Secondary | ICD-10-CM | POA: Insufficient documentation

## 2017-10-30 DIAGNOSIS — R109 Unspecified abdominal pain: Secondary | ICD-10-CM | POA: Diagnosis not present

## 2017-10-30 DIAGNOSIS — Z8249 Family history of ischemic heart disease and other diseases of the circulatory system: Secondary | ICD-10-CM | POA: Diagnosis not present

## 2017-10-30 DIAGNOSIS — F17210 Nicotine dependence, cigarettes, uncomplicated: Secondary | ICD-10-CM | POA: Diagnosis not present

## 2017-10-30 DIAGNOSIS — M1990 Unspecified osteoarthritis, unspecified site: Secondary | ICD-10-CM | POA: Diagnosis not present

## 2017-10-30 DIAGNOSIS — Z79899 Other long term (current) drug therapy: Secondary | ICD-10-CM | POA: Insufficient documentation

## 2017-10-30 DIAGNOSIS — D259 Leiomyoma of uterus, unspecified: Secondary | ICD-10-CM | POA: Insufficient documentation

## 2017-10-30 DIAGNOSIS — F10920 Alcohol use, unspecified with intoxication, uncomplicated: Secondary | ICD-10-CM

## 2017-10-30 DIAGNOSIS — Z91018 Allergy to other foods: Secondary | ICD-10-CM | POA: Insufficient documentation

## 2017-10-30 DIAGNOSIS — Z5320 Procedure and treatment not carried out because of patient's decision for unspecified reasons: Secondary | ICD-10-CM

## 2017-10-30 DIAGNOSIS — Z3202 Encounter for pregnancy test, result negative: Secondary | ICD-10-CM

## 2017-10-30 DIAGNOSIS — Z5329 Procedure and treatment not carried out because of patient's decision for other reasons: Secondary | ICD-10-CM

## 2017-10-30 LAB — COMPREHENSIVE METABOLIC PANEL
ALK PHOS: 63 U/L (ref 38–126)
ALT: 20 U/L (ref 14–54)
AST: 45 U/L — AB (ref 15–41)
Albumin: 3.3 g/dL — ABNORMAL LOW (ref 3.5–5.0)
Anion gap: 9 (ref 5–15)
CHLORIDE: 107 mmol/L (ref 101–111)
CO2: 24 mmol/L (ref 22–32)
CREATININE: 0.73 mg/dL (ref 0.44–1.00)
Calcium: 8.6 mg/dL — ABNORMAL LOW (ref 8.9–10.3)
GFR calc Af Amer: >60 mL/min (ref >60)
GFR calc non Af Amer: >60 mL/min (ref >60)
GLUCOSE: 108 mg/dL — AB (ref 65–99)
Potassium: 3.8 mmol/L (ref 3.5–5.1)
SODIUM: 140 mmol/L (ref 135–145)
Total Bilirubin: 0.4 mg/dL (ref 0.3–1.2)
Total Protein: 5.7 g/dL — ABNORMAL LOW (ref 6.5–8.1)

## 2017-10-30 LAB — ETHANOL: ALCOHOL ETHYL (B): 377 mg/dL — AB (ref <10)

## 2017-10-30 LAB — RAPID URINE DRUG SCREEN, HOSP PERFORMED
Amphetamines: NOT DETECTED
Barbiturates: NOT DETECTED
Benzodiazepines: NOT DETECTED
COCAINE: NOT DETECTED
OPIATES: NOT DETECTED
Tetrahydrocannabinol: NOT DETECTED

## 2017-10-30 LAB — URINALYSIS, ROUTINE W REFLEX MICROSCOPIC
BILIRUBIN URINE: NEGATIVE
Glucose, UA: NEGATIVE mg/dL
Hgb urine dipstick: NEGATIVE
Ketones, ur: NEGATIVE mg/dL
Leukocytes, UA: NEGATIVE
NITRITE: NEGATIVE
PROTEIN: NEGATIVE mg/dL
Specific Gravity, Urine: 1.005 (ref 1.005–1.030)
pH: 5 (ref 5.0–8.0)

## 2017-10-30 LAB — CBC WITH DIFFERENTIAL/PLATELET
Basophils Absolute: 0 10*3/uL (ref 0.0–0.1)
Basophils Relative: 0 %
EOS PCT: 1 %
Eosinophils Absolute: 0 10*3/uL (ref 0.0–0.7)
HCT: 34.1 % — ABNORMAL LOW (ref 36.0–46.0)
Hemoglobin: 11.8 g/dL — ABNORMAL LOW (ref 12.0–15.0)
LYMPHS ABS: 2.5 10*3/uL (ref 0.7–4.0)
LYMPHS PCT: 39 %
MCH: 32.2 pg (ref 26.0–34.0)
MCHC: 34.6 g/dL (ref 30.0–36.0)
MCV: 93.2 fL (ref 78.0–100.0)
MONO ABS: 0.4 10*3/uL (ref 0.1–1.0)
MONOS PCT: 7 %
Neutro Abs: 3.4 10*3/uL (ref 1.7–7.7)
Neutrophils Relative %: 53 %
PLATELETS: 254 10*3/uL (ref 150–400)
RBC: 3.66 MIL/uL — ABNORMAL LOW (ref 3.87–5.11)
RDW: 13.9 % (ref 11.5–15.5)
WBC: 6.3 10*3/uL (ref 4.0–10.5)

## 2017-10-30 LAB — POCT PREGNANCY, URINE: Preg Test, Ur: NEGATIVE

## 2017-10-30 LAB — LIPASE, BLOOD: Lipase: 30 U/L (ref 11–51)

## 2017-10-30 MED ORDER — HALOPERIDOL LACTATE 5 MG/ML IJ SOLN
1.0000 mg | Freq: Four times a day (QID) | INTRAMUSCULAR | Status: DC | PRN
  Administered 2017-10-30: 1 mg via INTRAVENOUS
  Filled 2017-10-30 (×2): qty 0.2

## 2017-10-30 MED ORDER — HYDROMORPHONE HCL 1 MG/ML IJ SOLN
1.0000 mg | Freq: Once | INTRAMUSCULAR | Status: AC
  Administered 2017-10-30: 1 mg via INTRAVENOUS
  Filled 2017-10-30: qty 1

## 2017-10-30 MED ORDER — LACTATED RINGERS IV SOLN
INTRAVENOUS | Status: DC
  Administered 2017-10-30: 11:00:00 via INTRAVENOUS

## 2017-10-30 MED ORDER — IOPAMIDOL (ISOVUE-300) INJECTION 61%: 30.0000 mL | INTRAVENOUS | Status: AC

## 2017-10-30 MED ORDER — IOPAMIDOL (ISOVUE-300) INJECTION 61%
100.0000 mL | Freq: Once | INTRAVENOUS | Status: AC | PRN
  Administered 2017-10-30: 100 mL via INTRAVENOUS

## 2017-10-30 MED ORDER — ONDANSETRON HCL 4 MG/2ML IJ SOLN
4.0000 mg | Freq: Once | INTRAMUSCULAR | Status: AC
Start: 2017-10-30 — End: 2017-10-30
  Administered 2017-10-30: 4 mg via INTRAVENOUS
  Filled 2017-10-30: qty 2

## 2017-10-30 NOTE — Progress Notes (Signed)
Patient left AMA signed form in Epic, IV removed catheter tip intact, patient left with her female friend. Jorje Guild NP notified.

## 2017-10-30 NOTE — Progress Notes (Signed)
CRITICAL VALUE ALERT  Critical Value: ETOH 377 Date & Time Notied: 10/30/17 @ 1250 Provider Notified: yes by N.  Druebbisch, RN  Orders Received/Actions taken: No

## 2017-10-30 NOTE — MAU Provider Note (Signed)
History     CSN: [id number]  Arrival date and time: 10/30/17 9211   First Provider Initiated Contact with Patient 10/30/17 1045      Chief Complaint  Patient presents with  . Abdominal Pain   HPI   [name] is a 37 y.o. female [id number] non pregnant female here with abdominal pain. Patient has a history of alcohol abuse and schizophrenia. The pain started 2 weeks ago. The pain is located on the right side of her abdomen, on the lower part of her abdomen. States the pain comes and goes. States she has been drinking alcohol "but not that much".  States she drank 2- 40 ounce beers last night.   OB History    Gravida Para Term Preterm AB Living   2 1 0 1 1 0   SAB TAB Ectopic Multiple Live Births   1 0            Past Medical History:  Diagnosis Date  . Arthritis   . Claustrophobia   . Cold    currently - no meds  . Fallopian tube abscess   . Fibroids   . Paranoid schizophrenia ([place])   . SVD (spontaneous vaginal delivery)    x 1    Past Surgical History:  Procedure Laterality Date  . BUNIONECTOMY    . bunyonectomy Bilateral    x 1  . LAPAROSCOPIC UNILATERAL SALPINGECTOMY Left 08/18/2016   Procedure: LAPAROSCOPIC UNILATERAL SALPINGECTOMY;  Surgeon: Lavonia Drafts, MD;  Location: [facility];  Service: Gynecology;  Laterality: Left;  . LAPAROSCOPY  08/18/2016   Procedure: LAPAROSCOPY OPERATIVE;  Surgeon: Lavonia Drafts, MD;  Location: [facility];  Service: Gynecology;;  . LAPAROTOMY  08/18/2016   Procedure: LAPAROTOMY;  Surgeon: Lavonia Drafts, MD;  Location: [facility];  Service: Gynecology;;  mini laparotomy  . MYOMECTOMY  08/18/2016   Procedure: MYOMECTOMY;  Surgeon: Lavonia Drafts, MD;  Location: [facility];  Service: Gynecology;;  . TOOTH EXTRACTION      Family History  Problem Relation Age of Onset  . Hypertension Mother     Social History   Tobacco Use  . Smoking status: Current Every Day Smoker    Packs/day: 0.10    Years:  23.00    Pack years: 2.30    Types: Cigarettes  . Smokeless tobacco: Never Used  Substance Use Topics  . Alcohol use: Yes    Alcohol/week: 0.0 oz    Comment: beer 40 oz /week  . Drug use: Yes    Types: Marijuana, "Crack" cocaine    Comment: reports she has not used crack in "a while", recent marijuana    Allergies:  Allergies  Allergen Reactions  . Citrus Hives and Itching  . Food Hives, Itching and Other (See Comments)    Pt states that she is allergic to strawberries.    . Toradol [Ketorolac Tromethamine] Hives and Itching    Medications Prior to Admission  Medication Sig Dispense Refill Last Dose  . hydrochlorothiazide (HYDRODIURIL) 25 MG tablet Take 1 tablet (25 mg total) by mouth daily. 90 tablet 3   . hydrOXYzine (VISTARIL) 50 MG capsule Take 50 mg by mouth every 6 (six) hours as needed for anxiety.    Not Taking  . loratadine (CLARITIN) 10 MG tablet Take 10 mg by mouth daily as needed for allergies.   Not Taking  . omeprazole (PRILOSEC) 40 MG capsule Take 1 capsule (40 mg total) by mouth daily before supper. (Patient not taking: Reported on 10/29/2017) 30 capsule  0 Not Taking  . polyethylene glycol powder (GLYCOLAX/MIRALAX) powder Take 17 g by mouth 2 (two) times daily as needed. 3350 g 1   . traZODone (DESYREL) 150 MG tablet Take 150 mg by mouth at bedtime as needed for sleep.    Not Taking  . ziprasidone (GEODON) 60 MG capsule Take 60 mg by mouth at bedtime.    Not Taking   Results for orders placed or performed during the hospital encounter of 10/30/17 (from the past 48 hour(s))  Urinalysis, Routine w reflex microscopic     Status: Abnormal   Collection Time: 10/30/17 10:05 AM  Result Value Ref Range   Color, Urine STRAW (A) YELLOW   APPearance CLEAR CLEAR   Specific Gravity, Urine 1.005 1.005 - 1.030   pH 5.0 5.0 - 8.0   Glucose, UA NEGATIVE NEGATIVE mg/dL   Hgb urine dipstick NEGATIVE NEGATIVE   Bilirubin Urine NEGATIVE NEGATIVE   Ketones, ur NEGATIVE NEGATIVE  mg/dL   Protein, ur NEGATIVE NEGATIVE mg/dL   Nitrite NEGATIVE NEGATIVE   Leukocytes, UA NEGATIVE NEGATIVE  Urine rapid drug screen (hosp performed)     Status: None   Collection Time: 10/30/17 10:06 AM  Result Value Ref Range   Opiates NONE DETECTED NONE DETECTED   Cocaine NONE DETECTED NONE DETECTED   Benzodiazepines NONE DETECTED NONE DETECTED   Amphetamines NONE DETECTED NONE DETECTED   Tetrahydrocannabinol NONE DETECTED NONE DETECTED   Barbiturates NONE DETECTED NONE DETECTED    Comment: (NOTE) DRUG SCREEN FOR MEDICAL PURPOSES ONLY.  IF CONFIRMATION IS NEEDED FOR ANY PURPOSE, NOTIFY LAB WITHIN 5 DAYS. LOWEST DETECTABLE LIMITS FOR URINE DRUG SCREEN Drug Class                     Cutoff (ng/mL) Amphetamine and metabolites    1000 Barbiturate and metabolites    200 Benzodiazepine                 967 Tricyclics and metabolites     300 Opiates and metabolites        300 Cocaine and metabolites        300 THC                            50   Pregnancy, urine POC     Status: None   Collection Time: 10/30/17 10:21 AM  Result Value Ref Range   Preg Test, Ur NEGATIVE NEGATIVE    Comment:        THE SENSITIVITY OF THIS METHODOLOGY IS >24 mIU/mL     Review of Systems  Constitutional: Negative for fever.  Gastrointestinal: Positive for abdominal pain, nausea and vomiting.  Psychiatric/Behavioral: Positive for agitation.   Physical Exam   Blood pressure 131/87, pulse (!) 115, temperature 98.6 F (37 C), resp. rate 18.  Physical Exam  Constitutional: She is oriented to person, place, and time. She appears well-developed and well-nourished. No distress.  HENT:  Head: Normocephalic.  Eyes: Pupils are equal, round, and reactive to light.  Neck: Neck supple.  GI: She exhibits distension. There is tenderness in the right lower quadrant, epigastric area, periumbilical area and suprapubic area. There is guarding.  Neurological: She is oriented to person, place, and time.  Skin:  She is not diaphoretic.  Psychiatric: Her affect is blunt and inappropriate. Her speech is rapid and/or pressured and slurred. Cognition and memory are impaired. She expresses impulsivity and inappropriate judgment.  MAU Course  Procedures  None  MDM  Attempted to perform pelvic exam; patient thrashing in the bed, unable to lay still for exam IV dilaudid & zofran ordered  Cbc with diff, cmp, lipase, ethanol level.  CT ordered  Report given to Jorje Guild NP who resumes care of patient  Rasch, Artist Pais, NP  Assessment and Plan    Patient left AMA while results pending  Jorje Guild, NP

## 2017-10-30 NOTE — MAU Note (Signed)
Patient presents with right side abdominal pain, denies vaginal bleeding, was drinking last night

## 2017-10-30 NOTE — MAU Note (Signed)
Pt visitor called out states pt got out of bed and laid down on the floor and was needing assistance to get pt back into bed. RN and [facility] assist pt back into bed. Pt denies injuries.

## 2017-10-31 ENCOUNTER — Encounter: Payer: Self-pay | Admitting: Nurse Practitioner

## 2017-10-31 DIAGNOSIS — F200 Paranoid schizophrenia: Secondary | ICD-10-CM | POA: Insufficient documentation

## 2017-11-03 ENCOUNTER — Telehealth: Payer: Self-pay | Admitting: Nurse Practitioner

## 2017-11-03 NOTE — Telephone Encounter (Signed)
Pt came to the office to please call her back for her lab result, please follow up

## 2017-11-03 NOTE — Telephone Encounter (Signed)
Will route to PCP 

## 2017-11-04 ENCOUNTER — Telehealth: Payer: Self-pay | Admitting: Nurse Practitioner

## 2017-11-04 ENCOUNTER — Other Ambulatory Visit: Payer: Self-pay

## 2017-11-04 ENCOUNTER — Encounter (HOSPITAL_COMMUNITY): Payer: Self-pay | Admitting: Emergency Medicine

## 2017-11-04 ENCOUNTER — Emergency Department (HOSPITAL_COMMUNITY)
Admission: EM | Admit: 2017-11-04 | Discharge: 2017-11-04 | Discharge status: Against Medical Advice | Payer: Medicaid Other | Attending: Emergency Medicine | Admitting: Emergency Medicine

## 2017-11-04 DIAGNOSIS — R109 Unspecified abdominal pain: Secondary | ICD-10-CM | POA: Diagnosis not present

## 2017-11-04 DIAGNOSIS — Z5321 Procedure and treatment not carried out due to patient leaving prior to being seen by health care provider: Secondary | ICD-10-CM | POA: Diagnosis not present

## 2017-11-04 LAB — URINALYSIS, ROUTINE W REFLEX MICROSCOPIC
BILIRUBIN URINE: NEGATIVE
GLUCOSE, UA: NEGATIVE mg/dL
HGB URINE DIPSTICK: NEGATIVE
Ketones, ur: NEGATIVE mg/dL
Leukocytes, UA: NEGATIVE
Nitrite: NEGATIVE
PROTEIN: NEGATIVE mg/dL
Specific Gravity, Urine: <1.005 — ABNORMAL LOW (ref 1.005–1.030)
pH: 5.5 (ref 5.0–8.0)

## 2017-11-04 LAB — CMP14+EGFR
ALK PHOS: 63 IU/L (ref 39–117)
ALT: 18 IU/L (ref 0–32)
AST: 45 IU/L — AB (ref 0–40)
Albumin/Globulin Ratio: 1.8 (ref 1.2–2.2)
Albumin: 3.7 g/dL (ref 3.5–5.5)
BILIRUBIN TOTAL: 0.4 mg/dL (ref 0.0–1.2)
BUN/Creatinine Ratio: 7 — ABNORMAL LOW (ref 9–23)
BUN: 5 mg/dL — AB (ref 6–20)
CHLORIDE: 106 mmol/L (ref 96–106)
CO2: 28 mmol/L (ref 20–29)
CREATININE: 0.68 mg/dL (ref 0.57–1.00)
Calcium: 8.7 mg/dL (ref 8.7–10.2)
GFR calc Af Amer: 129 mL/min/{1.73_m2} (ref >59)
GFR calc non Af Amer: 112 mL/min/{1.73_m2} (ref >59)
GLUCOSE: 74 mg/dL (ref 65–99)
Globulin, Total: 2.1 g/dL (ref 1.5–4.5)
Potassium: 4 mmol/L (ref 3.5–5.2)
Sodium: 143 mmol/L (ref 134–144)
Total Protein: 5.8 g/dL — ABNORMAL LOW (ref 6.0–8.5)

## 2017-11-04 LAB — COMPREHENSIVE METABOLIC PANEL
ALT: 16 U/L (ref 14–54)
AST: 40 U/L (ref 15–41)
Albumin: 3.7 g/dL (ref 3.5–5.0)
Alkaline Phosphatase: 62 U/L (ref 38–126)
Anion gap: 15 (ref 5–15)
CHLORIDE: 96 mmol/L — AB (ref 101–111)
CO2: 23 mmol/L (ref 22–32)
CREATININE: 0.69 mg/dL (ref 0.44–1.00)
Calcium: 9.1 mg/dL (ref 8.9–10.3)
GFR calc Af Amer: >60 mL/min (ref >60)
GFR calc non Af Amer: >60 mL/min (ref >60)
Glucose, Bld: 90 mg/dL (ref 65–99)
Potassium: 3.1 mmol/L — ABNORMAL LOW (ref 3.5–5.1)
SODIUM: 134 mmol/L — AB (ref 135–145)
Total Bilirubin: 1 mg/dL (ref 0.3–1.2)
Total Protein: 6.2 g/dL — ABNORMAL LOW (ref 6.5–8.1)

## 2017-11-04 LAB — CBC
HCT: 36.7 % (ref 36.0–46.0)
Hemoglobin: 13.3 g/dL (ref 12.0–15.0)
MCH: 32.8 pg (ref 26.0–34.0)
MCHC: 36.2 g/dL — AB (ref 30.0–36.0)
MCV: 90.6 fL (ref 78.0–100.0)
PLATELETS: 293 10*3/uL (ref 150–400)
RBC: 4.05 MIL/uL (ref 3.87–5.11)
RDW: 12.9 % (ref 11.5–15.5)
WBC: 7.6 10*3/uL (ref 4.0–10.5)

## 2017-11-04 LAB — I-STAT BETA HCG BLOOD, ED (MC, WL, AP ONLY): I-stat hCG, quantitative: <5 m[IU]/mL (ref <5)

## 2017-11-04 LAB — LIPASE, BLOOD: LIPASE: 26 U/L (ref 11–51)

## 2017-11-04 NOTE — ED Triage Notes (Addendum)
Pt reports R lower abd pain x 1 week, n/v, denies dysuria,  vag bleeding or discharge.  Pt states, "this is what it felt like when they had to remove my left fallopian tube."

## 2017-11-04 NOTE — Telephone Encounter (Signed)
Will route to PCP 

## 2017-11-04 NOTE — Telephone Encounter (Signed)
Pt came in to state that  -hydrochlorothiazide (HYDRODIURIL) 25 MG tablet  Is giving her side effects which include -Dizziness -Back and stomach numbness -cough -increased sweating -loss of appetite  -weight loss -tired and weakness

## 2017-11-04 NOTE — ED Notes (Signed)
Patient gave stickers to valet and advised she was leaving at this time

## 2017-11-08 ENCOUNTER — Telehealth: Payer: Self-pay

## 2017-11-08 NOTE — Telephone Encounter (Signed)
-----   Message from [name], NP sent at 11/08/2017 12:17 AM EST ----- Sodium, Potassium and Kidney function are normal. Make sure you are drinking at least 46-64 oz of water per day.

## 2017-11-08 NOTE — Telephone Encounter (Signed)
CMA called patient regarding lab result.   Patient understood.

## 2017-11-08 NOTE — Telephone Encounter (Signed)
CMA called patient regarding lab result.  Patient understood.

## 2017-11-09 NOTE — Telephone Encounter (Signed)
NOTED

## 2017-11-16 ENCOUNTER — Encounter: Payer: Self-pay | Admitting: Nurse Practitioner

## 2017-11-16 ENCOUNTER — Ambulatory Visit: Payer: Medicaid Other | Attending: Nurse Practitioner | Admitting: Nurse Practitioner

## 2017-11-16 ENCOUNTER — Other Ambulatory Visit (HOSPITAL_COMMUNITY)
Admission: RE | Admit: 2017-11-16 | Discharge: 2017-11-16 | Disposition: A | Discharge status: Home / Self Care | Payer: Medicaid Other | Source: Ambulatory Visit | Attending: Nurse Practitioner | Admitting: Nurse Practitioner

## 2017-11-16 VITALS — BP 122/77 | HR 88 | Temp 98.1°F | Ht 61.0 in | Wt 138.4 lb

## 2017-11-16 DIAGNOSIS — I10 Essential (primary) hypertension: Secondary | ICD-10-CM

## 2017-11-16 DIAGNOSIS — Z8249 Family history of ischemic heart disease and other diseases of the circulatory system: Secondary | ICD-10-CM | POA: Insufficient documentation

## 2017-11-16 DIAGNOSIS — Z01419 Encounter for gynecological examination (general) (routine) without abnormal findings: Secondary | ICD-10-CM

## 2017-11-16 DIAGNOSIS — Z888 Allergy status to other drugs, medicaments and biological substances status: Secondary | ICD-10-CM | POA: Diagnosis not present

## 2017-11-16 DIAGNOSIS — M1990 Unspecified osteoarthritis, unspecified site: Secondary | ICD-10-CM | POA: Insufficient documentation

## 2017-11-16 DIAGNOSIS — Z79899 Other long term (current) drug therapy: Secondary | ICD-10-CM | POA: Insufficient documentation

## 2017-11-16 DIAGNOSIS — F200 Paranoid schizophrenia: Secondary | ICD-10-CM | POA: Insufficient documentation

## 2017-11-16 DIAGNOSIS — F17200 Nicotine dependence, unspecified, uncomplicated: Secondary | ICD-10-CM | POA: Insufficient documentation

## 2017-11-16 DIAGNOSIS — R87619 Unspecified abnormal cytological findings in specimens from cervix uteri: Secondary | ICD-10-CM | POA: Insufficient documentation

## 2017-11-16 DIAGNOSIS — Z111 Encounter for screening for respiratory tuberculosis: Secondary | ICD-10-CM

## 2017-11-16 DIAGNOSIS — Z124 Encounter for screening for malignant neoplasm of cervix: Secondary | ICD-10-CM | POA: Diagnosis not present

## 2017-11-16 MED ORDER — CLONIDINE HCL 0.1 MG PO TABS: 0.1000 mg | ORAL_TABLET | Freq: Three times a day (TID) | ORAL | 3 refills | Status: DC

## 2017-11-16 NOTE — Progress Notes (Signed)
Assessment & Plan:  Jessica Case was seen today for abnormal pap smear.  Diagnoses and all orders for this visit:  Encounter for cervical Pap smear with pelvic exam -     Cytology - PAP -     Cervicovaginal ancillary only  Essential hypertension -     cloNIDine (CATAPRES) 0.1 MG tablet; Take 1 tablet (0.1 mg total) by mouth 3 (three) times daily.  Continue all antihypertensives as prescribed.  Remember to bring in your blood pressure log with you for your follow up appointment.  DASH/Mediterranean Diets are healthier choices for HTN.   Tobacco Dependency Jessica Case was counseled on the dangers of tobacco use, and was advised to quit. Reviewed strategies to maximize success, including removing cigarettes and smoking materials from environment, stress management and support of family/friends as well as pharmacological alternatives including: Wellbutrin, Chantix, Nicotine patch, Nicotine gum or lozenges. Smoking cessation support: smoking cessation hotline: 1-800-QUIT-NOW.  Smoking cessation classes are also available through [facility]. Call 3652463078 or visit our website at [email or web address]   Spent 3 minutes counseling on smoking cessation and patient is not ready to quit.    Patient has been counseled on age-appropriate routine health concerns for screening and prevention. These are reviewed and up-to-date. Referrals have been placed accordingly. Immunizations are up-to-date or declined.    Subjective:   Chief Complaint  Patient presents with  . Abnormal Pap Smear    Patient is here for a pap. Patient would like to get a TB test done for her job. Patient stated she have left side pain on her left leg and think it may be due to arthritis. Patient stated she quit taking Hydroclorothizide and would like Clonidine.    HPI Jessica Case 37 y.o. female presents to office today for PAP.   Essential Hypertension She was seen for the first time by me on  10-29-2017 and was noted for poorly controlled HTN.  At that time she was not taking clonidine 0.1mg  TID as prescribed. I placed her on HCTZ for better adherence. Today she reports the HCTZ made her feel dizzy, and sick with nausea and vomiting. She states she was in the hospital a day after taking the HCTZ for GI upset due to the HCTZ however the last hospital note dated 10-30-2017 was for and admission due to alcohol intoxication for which she left AMA. She is requesting to be switched back to clonidine and assures me she will take it as prescribed. She will follow up in 2-3 weeks and is aware that if her blood pressure is still poorly controlled and she is nonadherent we will have to switch her to another antihypertensive. She verbalized agreement. Today she denies chest pain, shortness of breath, palpitations, lightheadedness, dizziness, headaches or BLE edema.    Review of Systems  Constitutional: Negative.  Negative for fever.  Respiratory: Negative.  Negative for cough and shortness of breath.   Cardiovascular: Negative.  Negative for chest pain, orthopnea, claudication and leg swelling.  Genitourinary: Negative.   Musculoskeletal: Positive for myalgias (left buttock).  Neurological: Negative.  Negative for dizziness, sensory change, focal weakness and headaches.  Psychiatric/Behavioral: Positive for substance abuse.    Past Medical History:  Diagnosis Date  . Arthritis   . Claustrophobia   . Cold    currently - no meds  . Fallopian tube abscess   . Fibroids   . Paranoid schizophrenia ([place])   . SVD (spontaneous vaginal delivery)    x 1  Past Surgical History:  Procedure Laterality Date  . BUNIONECTOMY    . bunyonectomy Bilateral    x 1  . LAPAROSCOPIC UNILATERAL SALPINGECTOMY Left 08/18/2016   Procedure: LAPAROSCOPIC UNILATERAL SALPINGECTOMY;  Surgeon: Lavonia Drafts, MD;  Location: [facility];  Service: Gynecology;  Laterality: Left;  . LAPAROSCOPY  08/18/2016    Procedure: LAPAROSCOPY OPERATIVE;  Surgeon: Lavonia Drafts, MD;  Location: [facility];  Service: Gynecology;;  . LAPAROTOMY  08/18/2016   Procedure: LAPAROTOMY;  Surgeon: Lavonia Drafts, MD;  Location: [facility];  Service: Gynecology;;  mini laparotomy  . MYOMECTOMY  08/18/2016   Procedure: MYOMECTOMY;  Surgeon: Lavonia Drafts, MD;  Location: [facility];  Service: Gynecology;;  . TOOTH EXTRACTION      Family History  Problem Relation Age of Onset  . Hypertension Mother     Social History Reviewed with no changes to be made today.   Outpatient Medications Prior to Visit  Medication Sig Dispense Refill  . hydrOXYzine (VISTARIL) 50 MG capsule Take 50 mg by mouth every 6 (six) hours as needed for anxiety.     Marland Kitchen lurasidone (LATUDA) 40 MG TABS tablet Take 40 mg by mouth daily with breakfast.    . traZODone (DESYREL) 100 MG tablet Take 100 mg by mouth 2 (two) times daily.    Marland Kitchen loratadine (CLARITIN) 10 MG tablet Take 10 mg by mouth daily as needed for allergies.    Marland Kitchen omeprazole (PRILOSEC) 40 MG capsule Take 1 capsule (40 mg total) by mouth daily before supper. (Patient not taking: Reported on 10/29/2017) 30 capsule 0  . polyethylene glycol powder (GLYCOLAX/MIRALAX) powder Take 17 g by mouth 2 (two) times daily as needed. (Patient not taking: Reported on 11/16/2017) 3350 g 1  . ziprasidone (GEODON) 60 MG capsule Take 60 mg by mouth at bedtime.     . hydrochlorothiazide (HYDRODIURIL) 25 MG tablet Take 1 tablet (25 mg total) by mouth daily. (Patient not taking: Reported on 11/16/2017) 90 tablet 3  . traZODone (DESYREL) 150 MG tablet Take 100 mg by mouth at bedtime as needed for sleep.      No facility-administered medications prior to visit.     Allergies  Allergen Reactions  . Citrus Hives and Itching  . Food Hives, Itching and Other (See Comments)    Pt states that she is allergic to strawberries.    . Toradol [Ketorolac Tromethamine] Hives and Itching       Objective:    BP  122/77 (BP Location: Right Arm, Patient Position: Sitting, Cuff Size: Normal)   Pulse 88   Temp 98.1 F (36.7 C) (Oral)   Ht 5\' 1"  (1.549 m)   Wt 138 lb 6.4 oz (62.8 kg)   SpO2 99%   BMI 26.15 kg/m  Wt Readings from Last 3 Encounters:  11/16/17 138 lb 6.4 oz (62.8 kg)  10/29/17 132 lb 3.2 oz (60 kg)  05/11/17 136 lb 6.4 oz (61.9 kg)    Physical Exam  Constitutional: She is oriented to person, place, and time. She appears well-developed.  Cardiovascular: Normal rate and regular rhythm.  Pulmonary/Chest: Effort normal and breath sounds normal. No respiratory distress. She has no wheezes. She has no rales. She exhibits no tenderness.  Genitourinary: Rectum normal and uterus normal. There is no rash, tenderness, lesion or injury on the right labia. There is no rash, tenderness, lesion or injury on the left labia. Uterus is not deviated, not enlarged, not fixed and not tender. Cervix exhibits discharge. Cervix exhibits no motion tenderness  and no friability. Right adnexum displays no mass, no tenderness and no fullness. Left adnexum displays no mass, no tenderness and no fullness. No erythema, tenderness or bleeding in the vagina. No foreign body in the vagina. No signs of injury around the vagina. Vaginal discharge found.  Neurological: She is alert and oriented to person, place, and time.  Skin: Skin is warm and dry.  Psychiatric: Her mood appears anxious. Her speech is slurred. She is hyperactive. Cognition and memory are normal. She expresses no suicidal plans and no homicidal plans.       Patient has been counseled extensively about nutrition and exercise as well as the importance of adherence with medications and regular follow-up. The patient was given clear instructions to go to ER or return to medical center if symptoms don't improve, worsen or new problems develop. The patient verbalized understanding.   Follow-up: Return in about 3 weeks (around 12/07/2017) for BP recheck.   [name], FNP-BC [facility] [place], [place]   11/16/2017, 10:33 AM

## 2017-11-16 NOTE — Patient Instructions (Addendum)
Preventing Cervical Cancer Cervical cancer is cancer that grows on the cervix. The cervix is at the bottom of the uterus. It connects the uterus to the vagina. The uterus is where a baby develops during pregnancy. Cancer occurs when cells become abnormal and start to grow out of control. Cervical cancer grows slowly and may not cause any symptoms at first. Over time, the cancer can grow deep into the cervix tissue and spread to other areas. If it is found early, cervical cancer can be treated effectively. You can also take steps to prevent this type of cancer. Most cases of cervical cancer are caused by an STI (sexually transmitted infection) called human papillomavirus (HPV). One way to reduce your risk of cervical cancer is to avoid infection with the HPV virus. You can do this by practicing safe sex and by getting the HPV vaccine. Getting regular Pap tests is also important because this can help identify changes in cells that could lead to cancer. Your chances of getting this disease can also be reduced by making certain lifestyle changes. How can I protect myself from cervical cancer? Preventing HPV infection  Ask your health care provider about getting the HPV vaccine. If you are 23 years old or younger, you may need to get this vaccine, which is given in three doses over 6 months. This vaccine protects against the types of HPV that could cause cancer.  Limit the number of people you have sex with. Also avoid having sex with people who have had many sex partners.  Use a latex condom during sex. Getting Pap tests  Get Pap tests regularly, starting at age 11. Talk with your health care provider about how often you need these tests. ? Most women who are 18?38 years of age should have a Pap test every 3 years. ? Most women who are 72?38 years of age should have a Pap test in combination with an HPV test every 5 years. ? Women with a higher risk of cervical cancer, such as those with a weakened  immune system or those who have been exposed to the drug diethylstilbestrol (DES), may need more frequent testing. Making other lifestyle changes  Do not use any products that contain nicotine or tobacco, such as cigarettes and e-cigarettes. If you need help quitting, ask your health care provider.  Eat at least 5 servings of fruits and vegetables every day.  Lose weight if you are overweight. Why are these changes important?  These changes and screening tests are designed to address the factors that are known to increase the risk of cervical cancer. Taking these steps is the best way to reduce your risk.  Having regular Pap tests will help identify changes in cells that could lead to cancer. Steps can then be taken to prevent cancer from developing.  These changes will also help find cervical cancer early. This type of cancer can be treated effectively if it is found early. It can be more dangerous and difficult to treat if cancer has grown deep into your cervix or has spread.  In addition to making you less likely to get cervical cancer, these changes will also provide other health benefits, such as the following: ? Practicing safe sex is important for preventing STIs and unplanned pregnancies. ? Avoiding tobacco can reduce your risk for other cancers and health issues. ? Eating a healthy diet and maintaining a healthy weight are good for your overall health. What can happen if changes are not made? In the  early stages, cervical cancer might not have any symptoms. It can take many years for the cancer to grow and get deep into the cervix tissue. This may be happening without you knowing about it. If you develop any symptoms, such as pelvic pain or unusual discharge or bleeding from your vagina, you should see your health care provider right away. If cervical cancer is not found early, you might need treatments such as radiation, chemotherapy, or surgery. In some cases, surgery may mean that  you will not be able to get pregnant or carry a pregnancy to term. Where to find support: Talk with your health care provider, school nurse, or local health department for guidance about screening and vaccination. Some children and teens may be able to get the HPV vaccine free of charge through the U.S. government's Vaccines for Children [facility]) program. Other places that provide vaccinations include:  Public health clinics. Check with your local health department.  [place], where you would pay only what you can afford. To find one near you, check this website: http://lyons.com/  [place]. These are part of a program for Medicare and Medicaid patients who live in rural areas.  The National Breast and Cervical Cancer Early Detection Program also provides breast and cervical cancer screenings and diagnostic services to low-income, uninsured, and underinsured women. Cervical cancer can be passed down through families. Talk with your health care provider or genetic counselor to learn more about genetic testing for cancer. Where to find more information: Learn more about cervical cancer from:  [employer] Gynecology: WirelessShades.ch  American Cancer Society: www.cancer.org/cancer/cervicalcancer/  U.S. Centers for Disease Control and Prevention: ParisianParasols.gl  Summary  Talk with your health care provider about getting the HPV vaccine.  Be sure to get regular Pap tests as recommended by your health care provider.  See your health care provider right away if you have any pelvic pain or unusual discharge or bleeding from your vagina. This information is not intended to replace advice given to you by your health care provider. Make sure you discuss any questions you have with your health care provider. Document Released: 10/20/2015 Document Revised: 06/02/2016 Document Reviewed: 06/02/2016 Elsevier  Interactive Patient Education  2018 [employer]. Pap Test Why am I having this test? A pap test is sometimes called a pap smear. It is a screening test that is used to check for signs of cancer of the vagina, cervix, and uterus. The test can also identify the presence of infection or precancerous changes. Your health care provider will likely recommend you have this test done on a regular basis. This test may be done:  Every 3 years, starting at age 29.  Every 5 years, in combination with testing for the presence of human papillomavirus (HPV).  More or less often depending on other medical conditions.  What kind of sample is taken? Using a small cotton swab, plastic spatula, or brush, your health care provider will collect a sample of cells from the surface of your cervix. Your cervix is the opening to your uterus, also called a womb. Secretions from the cervix and vagina may also be collected. How do I prepare for this test?  Be aware of where you are in your menstrual cycle. You may be asked to reschedule the test if you are menstruating on the day of the test.  You may need to reschedule if you have a known vaginal infection on the day of the test.  You may  be asked to avoid douching or taking a bath the day before or the day of the test.  Some medicines can cause abnormal test results, such as digitalis and tetracycline. Talk with your health care provider before your test if you take one of these medicines. What do the results mean? Abnormal test results may indicate a number of health conditions. These may include:  Cancer. Although pap test results cannot be used to diagnose cancer of the cervix, vagina, or uterus, they may suggest the possibility of cancer. Further tests would be required to determine if cancer is present.  Sexually transmitted disease.  Fungal infection.  Parasite infection.  Herpes infection.  A condition causing or contributing to infertility.  It is  your responsibility to obtain your test results. Ask the lab or department performing the test when and how you will get your results. Contact your health care provider to discuss any questions you have about your results. Talk with your health care provider to discuss your results, treatment options, and if necessary, the need for more tests. Talk with your health care provider if you have any questions about your results. This information is not intended to replace advice given to you by your health care provider. Make sure you discuss any questions you have with your health care provider. Document Released: 12/26/2002 Document Revised: 06/10/2016 Document Reviewed: 02/26/2014 Elsevier Interactive Patient Education  [employer].

## 2017-11-17 LAB — CYTOLOGY - PAP
CHLAMYDIA, DNA PROBE: NEGATIVE
Diagnosis: NEGATIVE
NEISSERIA GONORRHEA: POSITIVE — AB

## 2017-11-17 LAB — CERVICOVAGINAL ANCILLARY ONLY
Bacterial vaginitis: POSITIVE — AB
Candida vaginitis: NEGATIVE
TRICH (WINDOWPATH): NEGATIVE

## 2017-11-18 ENCOUNTER — Other Ambulatory Visit: Payer: Self-pay | Admitting: Nurse Practitioner

## 2017-11-18 ENCOUNTER — Ambulatory Visit: Payer: Medicaid Other | Attending: Nurse Practitioner | Admitting: *Deleted

## 2017-11-18 ENCOUNTER — Telehealth: Payer: Self-pay | Admitting: *Deleted

## 2017-11-18 DIAGNOSIS — B9689 Other specified bacterial agents as the cause of diseases classified elsewhere: Secondary | ICD-10-CM

## 2017-11-18 DIAGNOSIS — A549 Gonococcal infection, unspecified: Secondary | ICD-10-CM | POA: Insufficient documentation

## 2017-11-18 DIAGNOSIS — N760 Acute vaginitis: Secondary | ICD-10-CM | POA: Diagnosis not present

## 2017-11-18 DIAGNOSIS — Z111 Encounter for screening for respiratory tuberculosis: Secondary | ICD-10-CM

## 2017-11-18 DIAGNOSIS — A64 Unspecified sexually transmitted disease: Secondary | ICD-10-CM

## 2017-11-18 LAB — TB SKIN TEST
Induration: 0 mm
TB Skin Test: NEGATIVE

## 2017-11-18 MED ORDER — AZITHROMYCIN 250 MG PO TABS
1000.0000 mg | ORAL_TABLET | Freq: Once | ORAL | Status: AC
  Administered 2017-11-19: 1000 mg via ORAL

## 2017-11-18 MED ORDER — CEFTRIAXONE SODIUM 250 MG IJ SOLR
250.0000 mg | Freq: Once | INTRAMUSCULAR | Status: AC
  Administered 2017-11-18: 250 mg via INTRAMUSCULAR

## 2017-11-18 MED ORDER — LIDOCAINE HCL (PF) 1 % IJ SOLN
0.9000 mL | Freq: Once | INTRAMUSCULAR | Status: AC
  Administered 2017-11-18: 0.9 mL

## 2017-11-18 MED ORDER — LIDOCAINE HCL (PF) 1 % IJ SOLN: 1.0000 mL | Freq: Once | INTRAMUSCULAR | Status: DC

## 2017-11-18 MED ORDER — AZITHROMYCIN 250 MG PO TABS
500.0000 mg | ORAL_TABLET | Freq: Once | ORAL | Status: AC
  Administered 2017-11-18: 500 mg via ORAL

## 2017-11-18 MED ORDER — METRONIDAZOLE 500 MG PO TABS: 500.0000 mg | ORAL_TABLET | Freq: Two times a day (BID) | ORAL | 0 refills | Status: DC

## 2017-11-18 NOTE — Addendum Note (Signed)
Addended by: Carilyn Goodpasture on: 11/18/2017 10:53 AM   Modules accepted: Orders

## 2017-11-18 NOTE — Telephone Encounter (Signed)
Patient came in today for her TB test read.  Patient was given her treatments during her nurse visit.   Faxed the form to [facility] today on 11/18/2017.

## 2017-11-18 NOTE — Progress Notes (Signed)
Please have patient return to office today for azithromycin 1000mg   (4 tablets)

## 2017-11-18 NOTE — Telephone Encounter (Signed)
Notes recorded by [name], NP on 11/18/2017 at 2:14 AM EST You have tested positive for gonorrhea. This is an STD that is reported to the Health Department as a communicable disease. Please make sure you see the nurse for treatment at your office visit today. You also tested positive for bacterial vaginosis. >That prescription has been sent to the pharmacy. You should not drink alcohol while taking this medication.   Pt aware of message, informed while in office for PPD reading and treatment.

## 2017-11-18 NOTE — Progress Notes (Signed)
PPD Reading Note PPD read and results entered in [facility]. Result: 0 mm induration. Interpretation: negative    Rocephin injection was administered using 0.9 ml of 1% plain Lidocaine.   This procedure was well tolerated.

## 2017-11-18 NOTE — Telephone Encounter (Signed)
-----   Message from [name], NP sent at 11/18/2017  2:14 AM EST ----- You have tested positive for gonorrhea. This is an STD that is reported to the Health Department as a communicable disease. Please make sure you see the nurse for treatment at your office visit today. You also tested positive for bacterial vaginosis. >That prescription has been sent to the pharmacy. You should not drink alcohol while taking this medication.

## 2017-11-19 ENCOUNTER — Telehealth: Payer: Self-pay

## 2017-11-19 DIAGNOSIS — A549 Gonococcal infection, unspecified: Secondary | ICD-10-CM | POA: Diagnosis not present

## 2017-11-19 NOTE — Telephone Encounter (Signed)
CMA spoke to patient on the phone and patient was informed already her PPD skin test was negative.

## 2017-11-19 NOTE — Telephone Encounter (Signed)
-----   Message from [name], NP sent at 11/18/2017  8:52 PM EST ----- PPD skin test was negative.

## 2017-11-19 NOTE — Progress Notes (Signed)
Called patient and let her know to come in as a nurse visit for her additional treatment.   Patient understood and will come in today.

## 2017-11-22 ENCOUNTER — Telehealth: Payer: Self-pay | Admitting: Nurse Practitioner

## 2017-11-22 MED ORDER — METRONIDAZOLE 500 MG PO TABS: 500.0000 mg | ORAL_TABLET | Freq: Two times a day (BID) | ORAL | 0 refills | Status: AC

## 2017-11-22 NOTE — Telephone Encounter (Signed)
Pt called to request a new refill since she misplace her medication na can't find it, she need  metroNIDAZOLE (FLAGYL) 500 MG tablet If you auth please sent it to [id number] - [place], [place] - [address] [place] Please follow up

## 2017-11-22 NOTE — Telephone Encounter (Signed)
Re-sent to pharmacy.

## 2017-11-23 ENCOUNTER — Ambulatory Visit: Payer: Medicaid Other

## 2017-12-08 ENCOUNTER — Ambulatory Visit: Payer: Medicaid Other | Attending: Nurse Practitioner

## 2017-12-08 VITALS — BP 138/90 | HR 81 | Temp 97.5°F | Wt 136.2 lb

## 2017-12-08 DIAGNOSIS — I10 Essential (primary) hypertension: Secondary | ICD-10-CM | POA: Diagnosis not present

## 2017-12-08 DIAGNOSIS — Z0130 Encounter for examination of blood pressure without abnormal findings: Secondary | ICD-10-CM | POA: Diagnosis not present

## 2017-12-08 NOTE — Progress Notes (Signed)
Patient arrived at clinic and BP was checked. BP was 138/90 and patient was informed to follow up with PCP.

## 2018-01-15 ENCOUNTER — Other Ambulatory Visit: Payer: Self-pay | Admitting: Physician Assistant

## 2018-01-29 ENCOUNTER — Other Ambulatory Visit: Payer: Self-pay

## 2018-01-29 ENCOUNTER — Encounter (HOSPITAL_COMMUNITY): Payer: Self-pay | Admitting: Emergency Medicine

## 2018-01-29 ENCOUNTER — Emergency Department (HOSPITAL_COMMUNITY)
Admission: EM | Admit: 2018-01-29 | Discharge: 2018-01-29 | Disposition: A | Discharge status: Home / Self Care | Payer: Medicaid Other | Attending: Physician Assistant | Admitting: Physician Assistant

## 2018-01-29 DIAGNOSIS — I10 Essential (primary) hypertension: Secondary | ICD-10-CM | POA: Diagnosis not present

## 2018-01-29 DIAGNOSIS — F17210 Nicotine dependence, cigarettes, uncomplicated: Secondary | ICD-10-CM | POA: Insufficient documentation

## 2018-01-29 DIAGNOSIS — Z79899 Other long term (current) drug therapy: Secondary | ICD-10-CM | POA: Diagnosis not present

## 2018-01-29 DIAGNOSIS — R1084 Generalized abdominal pain: Secondary | ICD-10-CM | POA: Diagnosis present

## 2018-01-29 DIAGNOSIS — R197 Diarrhea, unspecified: Secondary | ICD-10-CM | POA: Diagnosis not present

## 2018-01-29 DIAGNOSIS — R112 Nausea with vomiting, unspecified: Secondary | ICD-10-CM | POA: Insufficient documentation

## 2018-01-29 LAB — CBC
HCT: 34.3 % — ABNORMAL LOW (ref 36.0–46.0)
Hemoglobin: 11.8 g/dL — ABNORMAL LOW (ref 12.0–15.0)
MCH: 32.2 pg (ref 26.0–34.0)
MCHC: 34.4 g/dL (ref 30.0–36.0)
MCV: 93.7 fL (ref 78.0–100.0)
PLATELETS: 416 10*3/uL — AB (ref 150–400)
RBC: 3.66 MIL/uL — AB (ref 3.87–5.11)
RDW: 15.3 % (ref 11.5–15.5)
WBC: 7.7 10*3/uL (ref 4.0–10.5)

## 2018-01-29 LAB — COMPREHENSIVE METABOLIC PANEL
ALK PHOS: 75 U/L (ref 38–126)
ALT: 39 U/L (ref 14–54)
AST: 146 U/L — ABNORMAL HIGH (ref 15–41)
Albumin: 3.5 g/dL (ref 3.5–5.0)
Anion gap: 12 (ref 5–15)
BILIRUBIN TOTAL: 1.3 mg/dL — AB (ref 0.3–1.2)
BUN: 6 mg/dL (ref 6–20)
CALCIUM: 9.1 mg/dL (ref 8.9–10.3)
CHLORIDE: 105 mmol/L (ref 101–111)
CO2: 23 mmol/L (ref 22–32)
CREATININE: 0.82 mg/dL (ref 0.44–1.00)
Glucose, Bld: 92 mg/dL (ref 65–99)
Potassium: 3.3 mmol/L — ABNORMAL LOW (ref 3.5–5.1)
Sodium: 140 mmol/L (ref 135–145)
TOTAL PROTEIN: 6.6 g/dL (ref 6.5–8.1)

## 2018-01-29 LAB — I-STAT BETA HCG BLOOD, ED (MC, WL, AP ONLY): I-stat hCG, quantitative: <5 m[IU]/mL (ref <5)

## 2018-01-29 LAB — LIPASE, BLOOD: LIPASE: 26 U/L (ref 11–51)

## 2018-01-29 MED ORDER — POTASSIUM CHLORIDE CRYS ER 20 MEQ PO TBCR
40.0000 meq | EXTENDED_RELEASE_TABLET | Freq: Once | ORAL | Status: AC
  Administered 2018-01-29: 40 meq via ORAL
  Filled 2018-01-29: qty 2

## 2018-01-29 MED ORDER — LOPERAMIDE HCL 2 MG PO CAPS: 2.0000 mg | ORAL_CAPSULE | Freq: Four times a day (QID) | ORAL | 0 refills | Status: DC | PRN

## 2018-01-29 MED ORDER — MORPHINE SULFATE (PF) 2 MG/ML IV SOLN
2.0000 mg | Freq: Once | INTRAVENOUS | Status: AC
  Administered 2018-01-29: 2 mg via INTRAVENOUS
  Filled 2018-01-29: qty 1

## 2018-01-29 MED ORDER — LOPERAMIDE HCL 2 MG PO CAPS
2.0000 mg | ORAL_CAPSULE | Freq: Once | ORAL | Status: AC
  Administered 2018-01-29: 2 mg via ORAL
  Filled 2018-01-29: qty 1

## 2018-01-29 MED ORDER — SODIUM CHLORIDE 0.9 % IV BOLUS
1000.0000 mL | Freq: Once | INTRAVENOUS | Status: AC
  Administered 2018-01-29: 1000 mL via INTRAVENOUS

## 2018-01-29 MED ORDER — ONDANSETRON 4 MG PO TBDP: 4.0000 mg | ORAL_TABLET | Freq: Three times a day (TID) | ORAL | 0 refills | Status: DC | PRN

## 2018-01-29 MED ORDER — HALOPERIDOL LACTATE 5 MG/ML IJ SOLN
2.0000 mg | Freq: Once | INTRAMUSCULAR | Status: AC
  Administered 2018-01-29: 2 mg via INTRAVENOUS
  Filled 2018-01-29: qty 1

## 2018-01-29 MED ORDER — ONDANSETRON HCL 4 MG/2ML IJ SOLN: 4.0000 mg | Freq: Once | INTRAMUSCULAR | Status: DC

## 2018-01-29 NOTE — ED Notes (Signed)
ED Provider at bedside. 

## 2018-01-29 NOTE — ED Provider Notes (Signed)
[facility] DEPT Provider Note   CSN: [id number] Arrival date & time: 01/29/18  0418     History   Chief Complaint Chief Complaint  Patient presents with  . Abdominal Pain  . Emesis  . Nausea  . Diarrhea    HPI Jessica Case is a 37 y.o. female with a past medical history of schizophrenia, hypertension, alcohol abuse, who presents to ED for evaluation of 3-day history of generalized abdominal pain, more prominent in the epigastric area, several episodes of NBNB emesis and diarrhea.  Reports sick contacts at home with similar symptoms.  No improvement in abdominal pain with Tylenol and BC powders taken prior to arrival.  She does report daily alcohol use, insisting of 4 cans of beer daily.  She reports occasional tobacco use but denies any other drug use.  Denies any vaginal discharge, dysuria, back pain, fevers, URI symptoms.   HPI  Past Medical History:  Diagnosis Date  . Arthritis   . Claustrophobia   . Cold    currently - no meds  . Fallopian tube abscess   . Fibroids   . Paranoid schizophrenia ([place])   . SVD (spontaneous vaginal delivery)    x 1    Patient Active Problem List   Diagnosis Date Noted  . Paranoid schizophrenia ([place]) 10/31/2017  . Right foot pain 05/11/2017  . Essential hypertension 10/08/2016  . Cocaine substance abuse ([place]) 10/08/2016  . Fibroids, subserous 08/18/2016  . Hydrosalpinx 04/13/2016  . Tobacco dependency 09/04/2015  . Migraine with status migrainosus 08/15/2015  . Allergic rhinitis 07/18/2015  . Pain due to dental caries 07/12/2014    Past Surgical History:  Procedure Laterality Date  . BUNIONECTOMY    . bunyonectomy Bilateral    x 1  . LAPAROSCOPIC UNILATERAL SALPINGECTOMY Left 08/18/2016   Procedure: LAPAROSCOPIC UNILATERAL SALPINGECTOMY;  Surgeon: Lavonia Drafts, MD;  Location: [facility];  Service: Gynecology;  Laterality: Left;  . LAPAROSCOPY  08/18/2016   Procedure: LAPAROSCOPY  OPERATIVE;  Surgeon: Lavonia Drafts, MD;  Location: [facility];  Service: Gynecology;;  . LAPAROTOMY  08/18/2016   Procedure: LAPAROTOMY;  Surgeon: Lavonia Drafts, MD;  Location: [facility];  Service: Gynecology;;  mini laparotomy  . MYOMECTOMY  08/18/2016   Procedure: MYOMECTOMY;  Surgeon: Lavonia Drafts, MD;  Location: [facility];  Service: Gynecology;;  . TOOTH EXTRACTION       OB History    Gravida  2   Para  1   Term  0   Preterm  1   AB  1   Living  0     SAB  1   TAB  0   Ectopic      Multiple      Live Births               Home Medications    Prior to Admission medications   Medication Sig Start Date End Date Taking? Authorizing Provider  cloNIDine (CATAPRES) 0.1 MG tablet Take 1 tablet (0.1 mg total) by mouth 3 (three) times daily. 11/16/17  Yes [name], NP  hydrOXYzine (VISTARIL) 50 MG capsule Take 50 mg by mouth every 6 (six) hours as needed for anxiety.    Yes [provider]  lurasidone (LATUDA) 40 MG TABS tablet Take 40 mg by mouth daily with breakfast.   Yes [provider]  traZODone (DESYREL) 100 MG tablet Take 200 mg by mouth at bedtime.    Yes [provider]  loperamide (IMODIUM) 2  MG capsule Take 1 capsule (2 mg total) by mouth 4 (four) times daily as needed for diarrhea or loose stools. 01/29/18   Jolanda Mccann, PA-C  omeprazole (PRILOSEC) 40 MG capsule Take 1 capsule (40 mg total) by mouth daily before supper. Patient not taking: Reported on 10/29/2017 05/11/17   Boykin Nearing, MD  ondansetron (ZOFRAN ODT) 4 MG disintegrating tablet Take 1 tablet (4 mg total) by mouth every 8 (eight) hours as needed for nausea or vomiting. 01/29/18   Kennia Vanvorst, PA-C  polyethylene glycol powder (GLYCOLAX/MIRALAX) powder Take 17 g by mouth 2 (two) times daily as needed. Patient not taking: Reported on 11/16/2017 10/29/17   [name], NP    Family History Family History  Problem Relation Age of Onset  .  Hypertension Mother     Social History Social History   Tobacco Use  . Smoking status: Current Every Day Smoker    Packs/day: 0.10    Years: 23.00    Pack years: 2.30    Types: Cigarettes  . Smokeless tobacco: Never Used  Substance Use Topics  . Alcohol use: Yes    Alcohol/week: 0.0 oz    Comment: beer 40 oz /week  . Drug use: Yes    Types: Marijuana, "Crack" cocaine    Comment: reports she has not used crack in "a while", recent marijuana     Allergies   Citrus; Food; and Toradol [ketorolac tromethamine]   Review of Systems Review of Systems  Constitutional: Negative for appetite change, chills and fever.  HENT: Negative for ear pain, rhinorrhea, sneezing and sore throat.   Eyes: Negative for photophobia and visual disturbance.  Respiratory: Negative for cough, chest tightness, shortness of breath and wheezing.   Cardiovascular: Negative for chest pain and palpitations.  Gastrointestinal: Positive for abdominal pain, diarrhea, nausea and vomiting. Negative for blood in stool and constipation.  Genitourinary: Negative for dysuria, hematuria and urgency.  Musculoskeletal: Negative for myalgias.  Skin: Negative for rash.  Neurological: Negative for dizziness, weakness and light-headedness.     Physical Exam Updated Vital Signs BP (!) 157/91   Pulse 71   Temp 98 F (36.7 C) (Oral)   Resp 18   Ht 5\' 1"  (1.549 m)   Wt 59.9 kg (132 lb)   SpO2 100%   BMI 24.94 kg/m   Physical Exam  Constitutional: She appears well-developed and well-nourished. No distress.  HENT:  Head: Normocephalic and atraumatic.  Nose: Nose normal.  Eyes: Conjunctivae and EOM are normal. Left eye exhibits no discharge. No scleral icterus.  Neck: Normal range of motion. Neck supple.  Cardiovascular: Normal rate, regular rhythm, normal heart sounds and intact distal pulses. Exam reveals no gallop and no friction rub.  No murmur heard. Pulmonary/Chest: Effort normal and breath sounds normal.  No respiratory distress.  Abdominal: Soft. Bowel sounds are normal. She exhibits no distension. There is generalized tenderness and tenderness in the epigastric area. There is no rigidity and no guarding.  Generalized abdominal tenderness to palpation.  No rebound or guarding present.  Musculoskeletal: Normal range of motion. She exhibits no edema.  Neurological: She is alert. She exhibits normal muscle tone. Coordination normal.  Skin: Skin is warm and dry. No rash noted.  Psychiatric: She has a normal mood and affect.  Nursing note and vitals reviewed.    ED Treatments / Results  Labs (all labs ordered are listed, but only abnormal results are displayed) Labs Reviewed  COMPREHENSIVE METABOLIC PANEL - Abnormal; Notable for the following  components:      Result Value   Potassium 3.3 (*)    AST 146 (*)    Total Bilirubin 1.3 (*)    All other components within normal limits  CBC - Abnormal; Notable for the following components:   RBC 3.66 (*)    Hemoglobin 11.8 (*)    HCT 34.3 (*)    Platelets 416 (*)    All other components within normal limits  LIPASE, BLOOD  URINALYSIS, ROUTINE W REFLEX MICROSCOPIC  I-STAT BETA HCG BLOOD, ED ([facility], [facility], [facility] ONLY)    EKG None  Radiology No results found.  Procedures Procedures (including critical care time)  Medications Ordered in ED Medications  sodium chloride 0.9 % bolus 1,000 mL (0 mLs Intravenous Stopped 01/29/18 0834)  haloperidol lactate (HALDOL) injection 2 mg (2 mg Intravenous Given 01/29/18 0726)  morphine 2 MG/ML injection 2 mg (2 mg Intravenous Given 01/29/18 0911)  loperamide (IMODIUM) capsule 2 mg (2 mg Oral Given 01/29/18 0911)  potassium chloride SA (K-DUR,KLOR-CON) CR tablet 40 mEq (40 mEq Oral Given 01/29/18 0911)     Initial Impression / Assessment and Plan / ED Course  I have reviewed the triage vital signs and the nursing notes.  Pertinent labs & imaging results that were available during my care of the patient were  reviewed by me and considered in my medical decision making (see chart for details).     Patient presents to ED for evaluation of 3-day history of generalized abdominal pain, several episodes of emesis and diarrhea.  Sick contacts at home with similar symptoms.  She does report daily alcohol use.  On physical exam patient has generalized abdominal tenderness to palpation with no rebound or guarding present.  She had several episodes of loose stools here in the ED. lab work including CBC, lipase, urinalysis, hCG unremarkable.  CMP with mild hypokalemia at 3.3.  She is afebrile with no history of fever.  She is hypertensive because she is unable to take her blood pressure medication secondary to the vomiting.  Blood pressure improved after pain medication given.  No white count that would signify severe infection or reactive process.  Patient able to tolerate p.o. intake here without difficulty.  Unable to give urine sample.  Suspect that her symptoms are viral in nature, low suspicion for appendicitis, cholecystitis, other surgical cause.  Will give Imodium for ongoing diarrhea, Zofran as needed for nausea.  Advised to follow-up with PCP for further evaluation if symptoms persist.  Advised to return for any severe worsening symptoms.  Portions of this note were generated with [employer]. Dictation errors may occur despite best attempts at proofreading.   Final Clinical Impressions(s) / ED Diagnoses   Final diagnoses:  Nausea vomiting and diarrhea    ED Discharge Orders        Ordered    loperamide (IMODIUM) 2 MG capsule  4 times daily PRN     01/29/18 0943    ondansetron (ZOFRAN ODT) 4 MG disintegrating tablet  Every 8 hours PRN     01/29/18 0943       Delia Heady, PA-C 01/29/18 0951    Macarthur Critchley, MD 01/29/18 1324

## 2018-01-29 NOTE — ED Notes (Signed)
Patient aware needs a urine sample ONLY. Patient stooled/urine together x 2. Hat placed on commode.

## 2018-01-29 NOTE — ED Triage Notes (Signed)
Patient is complaining of abdominal pain all over. Patient is also complaining of nausea, diarrhea, and vomiting. Patient states that this started two days ago.

## 2018-02-15 ENCOUNTER — Ambulatory Visit: Payer: Medicaid Other | Admitting: Podiatry

## 2018-02-15 ENCOUNTER — Telehealth: Payer: Self-pay | Admitting: *Deleted

## 2018-02-15 ENCOUNTER — Ambulatory Visit (INDEPENDENT_AMBULATORY_CARE_PROVIDER_SITE_OTHER): Payer: Medicaid Other

## 2018-02-15 ENCOUNTER — Ambulatory Visit: Payer: Self-pay

## 2018-02-15 DIAGNOSIS — M795 Residual foreign body in soft tissue: Secondary | ICD-10-CM

## 2018-02-15 DIAGNOSIS — Z969 Presence of functional implant, unspecified: Secondary | ICD-10-CM

## 2018-02-15 DIAGNOSIS — M79672 Pain in left foot: Secondary | ICD-10-CM

## 2018-02-15 DIAGNOSIS — M79671 Pain in right foot: Secondary | ICD-10-CM

## 2018-02-15 DIAGNOSIS — G8929 Other chronic pain: Secondary | ICD-10-CM

## 2018-02-15 NOTE — Telephone Encounter (Signed)
Faxed orders to [facility]. 

## 2018-02-15 NOTE — Progress Notes (Signed)
Subjective: 37 year old female presents the office today for concerns of pain to both of her feet and she thinks the pain is coming from the screws and she will have the hardware removed.  She states that she is on her feet more she got a new job and that is what she can feel it more.  She states the bunions themselves are doing very well and she is having no issues otherwise.  She has no pain when trying to move the toe she denies any recent injury or trauma.  She does relate a history of stepping on glass several years ago to the right heel and since then she is had a dry skin to the area as well as a skin fissure.  She is concerned there may still be something present inside.  Denies any redness or drainage or any swelling.  She has no other concerns.Denies any systemic complaints such as fevers, chills, nausea, vomiting. No acute changes since last appointment, and no other complaints at this time.   Objective: AAO x3, NAD DP/PT pulses palpable bilaterally, CRT less than 3 seconds Tenderness tracking on the dorsal aspect of first metatarsal and this does seem to correspond to the screw is present however is not palpable.  There is no edema, erythema, increase in warmth.  The toe is in rectus position.  There is no pain or restriction with MPJ range of motion.  Dry skin present bilaterally and on the right posterior heel is a vertical skin fissure if this is more chronic and dry skin.  There is no drainage or pus or swelling or redness or any clinical signs of infection present. No open lesions or pre-ulcerative lesions.  No pain with calf compression, swelling, warmth, erythema  Assessment: 37 year old female painful retained hardware right foot, concern for foreign body  Plan: -All treatment options discussed with the patient including all alternatives, risks, complications.  -X-rays were obtained and reviewed.  Hardware intact.  Osteotomy sites appear to be well-healed.  No evidence of acute  fracture or stress fracture.  There is no evidence of foreign body. -I recommend moisturizer to the heel daily.  She is still concerned about possible foreign body.  I have ordered a diagnostic ultrasound to rule out a foreign object. -In regards to surgery she wants to have this removed and she was to start on the right foot.  I discussed heart removal as well as the risks and complications and alternatives and she wished to proceed with this. -The incision placement as well as the postoperative course was discussed with the patient. I discussed risks of the surgery which include, but not limited to, infection, bleeding, pain, swelling, need for further surgery, delayed or nonhealing, painful or ugly scar, numbness or sensation changes, over/under correction, recurrence, transfer lesions, further deformity, hardware failure, DVT/PE, loss of toe/foot. Patient understands these risks and wishes to proceed with surgery. The surgical consent was reviewed with the patient all 3 pages were signed. No promises or guarantees were given to the outcome of the procedure. All questions were answered to the best of my ability. Before the surgery the patient was encouraged to call the office if there is any further questions. The surgery will be performed at the [facility] on an outpatient basis. -Patient encouraged to call the office with any questions, concerns, change in symptoms.   Trula Slade DPM

## 2018-02-15 NOTE — Telephone Encounter (Signed)
-----   Message from Trula Slade, DPM sent at 02/15/2018 11:59 AM EDT ----- Can you please order a diagnostic ultrasound to rule out a foreign boy in the right heel? Stepped on glass previously and still pain, x-rays negative.

## 2018-02-15 NOTE — Patient Instructions (Signed)

## 2018-02-16 NOTE — Telephone Encounter (Signed)
[place] - MEDICAID APPROVED [id number] [place] OF RIGHT FOOT, AUTHORIZATION:  [id number], VALID 02/16/2018 END DATE 03/18/2018. Faxed to [facility].

## 2018-02-18 ENCOUNTER — Telehealth: Payer: Self-pay | Admitting: *Deleted

## 2018-02-18 DIAGNOSIS — M795 Residual foreign body in soft tissue: Secondary | ICD-10-CM

## 2018-02-18 NOTE — Telephone Encounter (Signed)
[facility] asked that the [place] order be changed to soft tissue.

## 2018-02-18 NOTE — Telephone Encounter (Signed)
"  I don't know how to operate a computer.  My boyfriend tried to register me but he couldn't do it either.  What should I do?"  You don't have to do anything.  Someone from the surgical center will call you a day or two before your surgery date.  They will ask you about the information they need and they will give you your arrival time.  "So I am scheduled for surgery?"  Yes, you are scheduled for surgery.  Registration does not have anything to do with the actual scheduling of surgery.  "Okay good, thank you."

## 2018-02-21 DIAGNOSIS — I10 Essential (primary) hypertension: Secondary | ICD-10-CM

## 2018-02-25 ENCOUNTER — Ambulatory Visit
Admission: RE | Admit: 2018-02-25 | Discharge: 2018-02-25 | Disposition: A | Discharge status: Home / Self Care | Payer: Medicaid Other | Source: Ambulatory Visit | Attending: Podiatry | Admitting: Podiatry

## 2018-02-25 DIAGNOSIS — M795 Residual foreign body in soft tissue: Secondary | ICD-10-CM

## 2018-03-02 ENCOUNTER — Encounter: Payer: Self-pay | Admitting: Podiatry

## 2018-03-02 ENCOUNTER — Other Ambulatory Visit: Payer: Self-pay | Admitting: Podiatry

## 2018-03-02 DIAGNOSIS — M2011 Hallux valgus (acquired), right foot: Secondary | ICD-10-CM | POA: Diagnosis not present

## 2018-03-02 MED ORDER — CEPHALEXIN 500 MG PO CAPS: 500.0000 mg | ORAL_CAPSULE | Freq: Three times a day (TID) | ORAL | 0 refills | Status: DC

## 2018-03-02 MED ORDER — HYDROCODONE-ACETAMINOPHEN 5-325 MG PO TABS: 1.0000 | ORAL_TABLET | Freq: Four times a day (QID) | ORAL | 0 refills | Status: DC | PRN

## 2018-03-02 NOTE — Progress Notes (Signed)
Postop medications sent to the pharmacy Vicodin and Keflex

## 2018-03-04 ENCOUNTER — Other Ambulatory Visit: Payer: Self-pay

## 2018-03-04 ENCOUNTER — Ambulatory Visit: Payer: Medicaid Other | Admitting: Family Medicine

## 2018-03-04 MED ORDER — OXYCODONE-ACETAMINOPHEN 5-325 MG PO TABS: 1.0000 | ORAL_TABLET | ORAL | 0 refills | Status: DC | PRN

## 2018-03-07 ENCOUNTER — Ambulatory Visit (INDEPENDENT_AMBULATORY_CARE_PROVIDER_SITE_OTHER): Payer: Self-pay | Admitting: Podiatry

## 2018-03-07 ENCOUNTER — Encounter: Payer: Self-pay | Admitting: Podiatry

## 2018-03-07 ENCOUNTER — Ambulatory Visit: Payer: Medicaid Other | Admitting: Nurse Practitioner

## 2018-03-07 ENCOUNTER — Ambulatory Visit (INDEPENDENT_AMBULATORY_CARE_PROVIDER_SITE_OTHER): Payer: Medicaid Other

## 2018-03-07 DIAGNOSIS — M795 Residual foreign body in soft tissue: Secondary | ICD-10-CM

## 2018-03-07 DIAGNOSIS — Z969 Presence of functional implant, unspecified: Secondary | ICD-10-CM

## 2018-03-07 DIAGNOSIS — M2012 Hallux valgus (acquired), left foot: Secondary | ICD-10-CM | POA: Diagnosis not present

## 2018-03-07 MED ORDER — HYDROCODONE-ACETAMINOPHEN 5-325 MG PO TABS: 1.0000 | ORAL_TABLET | ORAL | 0 refills | Status: DC | PRN

## 2018-03-09 NOTE — Progress Notes (Signed)
Subjective: Jessica Case is a 37 y.o. is seen today in office s/p right foot HWR preformed on 03/02/2018. They state their pain is improving.  She did take the Percocet she is asking for refill of pain medication.  She states that she is remained in the cam boot she has been walking in the cam boot. Denies any systemic complaints such as fevers, chills, nausea, vomiting. No calf pain, chest pain, shortness of breath.   Objective: General: No acute distress, AAOx3  DP/PT pulses palpable 2/4, CRT < 3 sec to all digits.  Protective sensation intact. Motor function intact.  RIGHT foot: Incision is well coapted without any evidence of dehiscence and sutures are intact. There is no surrounding erythema, ascending cellulitis, fluctuance, crepitus, malodor, drainage/purulence. There is mild edema around the surgical site. There is mild pain along the surgical site.  No other areas of tenderness to bilateral lower extremities.  No other open lesions or pre-ulcerative lesions.  No pain with calf compression, swelling, warmth, erythema.   Assessment and Plan:  Status post right foot hardware removal, doing well with no complications   -Treatment options discussed including all alternatives, risks, and complications -X-rays were obtained reviewed.  Status post hardware removal.  There is no evidence of acute fracture or stress fracture identified today. -Antibiotic ointment was applied followed by a bandage.  Keep the dressing clean, dry, intact -Ice/elevation -Pain medication as needed-I ordered Vicodin for her.  Discussed with her that her pain is improving so not to do any further Percocet.  Also she came in the office on Friday through her prescription across the check-in counter.  I talked to her about this and I do not appreciate her coming to the office and throwing her prescriptions at the front desk stafff.  She apologized for this. -Monitor for any clinical signs or symptoms of infection and  DVT/PE and directed to call the office immediately should any occur or go to the ER. -Follow-up 1 week for suture removal possibly or sooner if any problems arise. In the meantime, encouraged to call the office with any questions, concerns, change in symptoms.   [name], DPM

## 2018-03-15 ENCOUNTER — Ambulatory Visit (INDEPENDENT_AMBULATORY_CARE_PROVIDER_SITE_OTHER): Payer: Medicaid Other | Admitting: Podiatry

## 2018-03-15 ENCOUNTER — Encounter: Payer: Self-pay | Admitting: Podiatry

## 2018-03-15 VITALS — BP 133/95 | HR 95 | Temp 98.2°F | Resp 18

## 2018-03-15 DIAGNOSIS — Z969 Presence of functional implant, unspecified: Secondary | ICD-10-CM

## 2018-03-16 NOTE — Progress Notes (Signed)
Subjective: Jessica Case is a 37 y.o. is seen today in office s/p right foot HWR preformed on 03/02/2018.  She states that she is doing well she wants to go back to work tomorrow.  She presents today for suture removal.  She has not been taking pain medication.  She is been wearing the cam boot but she wants to stop wearing this.  She does state that she is done a lot of walking. Denies any systemic complaints such as fevers, chills, nausea, vomiting. No calf pain, chest pain, shortness of breath.   Objective: General: No acute distress, AAOx3  DP/PT pulses palpable 2/4, CRT < 3 sec to all digits.  Protective sensation intact. Motor function intact.  RIGHT foot: Incision is well coapted without any evidence of dehiscence and sutures are intact. There is no surrounding erythema, ascending cellulitis, fluctuance, crepitus, malodor, drainage/purulence. There is minimal edema around the surgical site. There is no pain along the surgical site.  No other areas of tenderness to bilateral lower extremities.  No other open lesions or pre-ulcerative lesions.  No pain with calf compression, swelling, warmth, erythema.   Assessment and Plan:  Status post right foot hardware removal, doing well with no complications   -Treatment options discussed including all alternatives, risks, and complications -Sutures removed today without any complications after removal incision remained well coapted.  Steri-Strips were applied for reinforcement followed by antibiotic ointment and a bandage.  She can start to shower in 2 days and get the area wet in order to keep antibiotic ointment over the area daily.  She can transition to a surgical shoe was dispensed.  She can return to work tomorrow but she is to gradually increase her activity.  She states that she does not stand all day.  Continue to ice and elevate. -Monitor for any clinical signs or symptoms of infection and directed to call the office immediately should any  occur or go to the ER. -RTC 2 weeks or sooner if needed  Trula Slade DPM

## 2018-03-25 ENCOUNTER — Other Ambulatory Visit: Payer: Self-pay | Admitting: Podiatry

## 2018-03-25 ENCOUNTER — Ambulatory Visit (INDEPENDENT_AMBULATORY_CARE_PROVIDER_SITE_OTHER): Payer: Medicaid Other | Admitting: Podiatry

## 2018-03-25 ENCOUNTER — Encounter: Payer: Self-pay | Admitting: Podiatry

## 2018-03-25 DIAGNOSIS — L02619 Cutaneous abscess of unspecified foot: Secondary | ICD-10-CM

## 2018-03-25 DIAGNOSIS — T8130XA Disruption of wound, unspecified, initial encounter: Secondary | ICD-10-CM

## 2018-03-25 DIAGNOSIS — L03119 Cellulitis of unspecified part of limb: Secondary | ICD-10-CM

## 2018-03-25 MED ORDER — CEPHALEXIN 500 MG PO CAPS: 500.0000 mg | ORAL_CAPSULE | Freq: Three times a day (TID) | ORAL | 0 refills | Status: DC

## 2018-03-25 MED ORDER — MUPIROCIN 2 % EX OINT: 1.0000 "application " | TOPICAL_OINTMENT | Freq: Two times a day (BID) | CUTANEOUS | 2 refills | Status: DC

## 2018-03-25 NOTE — Progress Notes (Signed)
Subjective: Jessica Case is a 37 y.o. is seen today in office s/p right foot HWR preformed on 03/02/2018.  She states that she has been active in her feet and she try to go back into a regular shoe and she noticed the incision open up some.  She states this happened 2 days ago and she thought that she could treated on her own by putting antibiotic ointment and a bandage on the area.  There is somewhat tender to palpation she states that overall pain is not significant.  She denies any fevers, chills, nausea, vomiting.  Denies any calf pain, chest pain, shortness of breath.  Denies any surrounding redness or red streaks and she has not had any pus. Denies any systemic complaints such as fevers, chills, nausea, vomiting. No calf pain, chest pain, shortness of breath.   Objective: General: No acute distress, AAOx3  DP/PT pulses palpable 2/4, CRT < 3 sec to all digits.  Protective sensation intact. Motor function intact.  RIGHT foot: On the incision there is a dehiscence within the incision.  Small no clear drainage there is no pus.  There is mild swelling to the area but there is no significant erythema there is no increase in warmth and there is no ascending cellulitis.  There is no fluctuation or crepitation there is no malodor. There is minimal tenderness palpation of the area.  No other open lesions or pre-ulcerative lesions.  No pain with calf compression, swelling, warmth, erythema.   Assessment and Plan:  Status post right foot hardware removal, with dehiscence  -Treatment options discussed including all alternatives, risks, and complications -There is no pus today for a small amount of clear drainage.  I dictated wound culture today.  We will start Keflex.  Continue with surgical shoe at all times.  Prescribed mupirocin ointment to apply daily.  Monitoring signs or symptoms of increased redness swelling or other signs of infection and call the office immediately should any occur or go to the  emergency room. -Follow-up in 1 week or sooner if needed.  Trula Slade DPM

## 2018-03-29 ENCOUNTER — Ambulatory Visit (INDEPENDENT_AMBULATORY_CARE_PROVIDER_SITE_OTHER): Payer: Medicaid Other | Admitting: Podiatry

## 2018-03-29 DIAGNOSIS — T8130XA Disruption of wound, unspecified, initial encounter: Secondary | ICD-10-CM

## 2018-03-29 DIAGNOSIS — M2011 Hallux valgus (acquired), right foot: Secondary | ICD-10-CM

## 2018-03-29 LAB — WOUND CULTURE
MICRO NUMBER:: 90687208
SPECIMEN QUALITY: ADEQUATE

## 2018-03-29 NOTE — Progress Notes (Signed)
Subjective: Jessica Case is a 37 y.o. is seen today in office s/p right foot HWR preformed on 03/02/2018.  She states that she is doing well the area is healed up.  She is having no pain.  She does keep antibiotic ointment and a bandage on the area and she keeps an Ace bandage on for swelling.  She is been wearing the surgical shoe.  She denies any redness or increased swelling actually swelling has improved.  Denies any red streaks.  No pain.  Denies any systemic complaints such as fevers, chills, nausea, vomiting. No calf pain, chest pain, shortness of breath.   Objective: General: No acute distress, AAOx3  DP/PT pulses palpable 2/4, CRT < 3 sec to all digits.  Protective sensation intact. Motor function intact.  RIGHT foot: On the  area the dehiscence this appears to be almost completely healed at this time.  There is decrease edema to the area there is no erythema or increase in warmth.  No tenderness palpation.  There is no increased warmth there is no fluctuation or crepitation or any malodor.  No other open lesions or pre-ulcerative lesion identified. No pain with calf compression, swelling, warmth, erythema.       Assessment and Plan:  Status post right foot hardware removal, with dehiscence with improvement  -Treatment options discussed including all alternatives, risks, and complications -Finished course of antibiotics.  Wound check call and get the results of the wound culture.  Given the area wet the next couple days it was completely healed.  Continue Ace bandage for helping with swelling.  Remain in surgical shoe to the area is completely healed.  Only with surgical shoe while at work to ensure healing.  I will see her back next 2 weeks or sooner if any issues are to arise.  I encouraged her to call any questions or concerns any changes. She has no further questions today.   Trula Slade DPM

## 2018-03-30 ENCOUNTER — Other Ambulatory Visit: Payer: Self-pay | Admitting: Podiatry

## 2018-03-30 MED ORDER — SULFAMETHOXAZOLE-TRIMETHOPRIM 400-80 MG PO TABS: 1.0000 | ORAL_TABLET | Freq: Two times a day (BID) | ORAL | 0 refills | Status: DC

## 2018-03-30 NOTE — Progress Notes (Signed)
I called her to dicussed the culture results. Sent Bactrim to her pharmacy. She states the wound has closed and there is no drainage and she feels she can go back into a shoe. Advised to keep wearing the surgical shoe until I see her back and monitor for any signs or symptoms of infection. Although she is doing better will continue bactrim. She verbalized understanding.   Trula Slade

## 2018-04-12 ENCOUNTER — Ambulatory Visit (INDEPENDENT_AMBULATORY_CARE_PROVIDER_SITE_OTHER): Payer: Medicaid Other | Admitting: Podiatry

## 2018-04-12 DIAGNOSIS — T8130XA Disruption of wound, unspecified, initial encounter: Secondary | ICD-10-CM

## 2018-04-12 DIAGNOSIS — L02619 Cutaneous abscess of unspecified foot: Secondary | ICD-10-CM

## 2018-04-12 DIAGNOSIS — L03119 Cellulitis of unspecified part of limb: Secondary | ICD-10-CM

## 2018-04-13 NOTE — Progress Notes (Signed)
Subjective: Jessica Case is a 37 y.o. is seen today in office s/p right foot HWR preformed on 03/02/2018.  She says the wound is completely healed she is not having any issues and she has been wearing the surgical shoe and she is very eager to get back into regular shoe.  She has not had any redness or warmth or any drainage from the swelling has actually improved quite a bit.  She denies any fevers, chills, nausea, vomiting.  Denies any calf pain, chest pain, shortness of breath.  She has no other concerns today.  Objective: General: No acute distress, AAOx3  DP/PT pulses palpable 2/4, CRT < 3 sec to all digits.  Protective sensation intact. Motor function intact.  RIGHT foot: Incision appears to be completely healed at this time.  There is decrease edema to the area there is no erythema or increased warmth.  There is no fluctuation or crepitation or any malodor.  No signs of infection the area has healed.  There is no pain to palpation of the surgical site and there is no pain or crepitation with MPJ range of motion.       Assessment and Plan:  Status post right foot hardware removal, with dehiscence with improvement  -Treatment options discussed including all alternatives, risks, and complications -At this time the incision is completely healed there is no signs of infection.  She can transition to regular shoe as tolerable.  Monitoring signs or symptoms of recurrence of infection and to let me know.  Otherwise she is doing well.  I will discharge her from the postoperative course and she agrees with this plan has no further questions or concerns today.  Trula Slade DPM

## 2018-05-03 ENCOUNTER — Encounter (HOSPITAL_COMMUNITY): Payer: Self-pay

## 2018-05-03 ENCOUNTER — Emergency Department (HOSPITAL_COMMUNITY)
Admission: EM | Admit: 2018-05-03 | Discharge: 2018-05-03 | Disposition: A | Discharge status: Home / Self Care | Payer: Medicaid Other | Attending: Emergency Medicine | Admitting: Emergency Medicine

## 2018-05-03 DIAGNOSIS — R197 Diarrhea, unspecified: Secondary | ICD-10-CM | POA: Diagnosis not present

## 2018-05-03 DIAGNOSIS — R109 Unspecified abdominal pain: Secondary | ICD-10-CM | POA: Diagnosis present

## 2018-05-03 DIAGNOSIS — R1110 Vomiting, unspecified: Secondary | ICD-10-CM | POA: Insufficient documentation

## 2018-05-03 DIAGNOSIS — Z5321 Procedure and treatment not carried out due to patient leaving prior to being seen by health care provider: Secondary | ICD-10-CM | POA: Insufficient documentation

## 2018-05-03 LAB — COMPREHENSIVE METABOLIC PANEL
ALK PHOS: 97 U/L (ref 38–126)
ALT: 41 U/L (ref 0–44)
AST: 137 U/L — AB (ref 15–41)
Albumin: 3.3 g/dL — ABNORMAL LOW (ref 3.5–5.0)
Anion gap: 16 — ABNORMAL HIGH (ref 5–15)
BILIRUBIN TOTAL: 1 mg/dL (ref 0.3–1.2)
CALCIUM: 8.6 mg/dL — AB (ref 8.9–10.3)
CO2: 24 mmol/L (ref 22–32)
CREATININE: 0.84 mg/dL (ref 0.44–1.00)
Chloride: 95 mmol/L — ABNORMAL LOW (ref 98–111)
Glucose, Bld: 168 mg/dL — ABNORMAL HIGH (ref 70–99)
Potassium: 2.8 mmol/L — ABNORMAL LOW (ref 3.5–5.1)
Sodium: 135 mmol/L (ref 135–145)
TOTAL PROTEIN: 5.9 g/dL — AB (ref 6.5–8.1)

## 2018-05-03 LAB — CBC
HCT: 32.1 % — ABNORMAL LOW (ref 36.0–46.0)
Hemoglobin: 10.9 g/dL — ABNORMAL LOW (ref 12.0–15.0)
MCH: 33.5 pg (ref 26.0–34.0)
MCHC: 34 g/dL (ref 30.0–36.0)
MCV: 98.8 fL (ref 78.0–100.0)
PLATELETS: 282 10*3/uL (ref 150–400)
RBC: 3.25 MIL/uL — AB (ref 3.87–5.11)
RDW: 14.1 % (ref 11.5–15.5)
WBC: 5.6 10*3/uL (ref 4.0–10.5)

## 2018-05-03 LAB — LIPASE, BLOOD: Lipase: 37 U/L (ref 11–51)

## 2018-05-03 LAB — I-STAT BETA HCG BLOOD, ED (MC, WL, AP ONLY)

## 2018-05-03 NOTE — ED Notes (Signed)
Called Patient to be roomed x3 and had no response.

## 2018-05-03 NOTE — ED Triage Notes (Signed)
Onset yesterday LLQ abd pain, nausea, vomiting x 2 today, diarrhea x 3, watery and yellow.

## 2018-05-06 NOTE — ED Notes (Signed)
Follow up call made  Has an appointment w pcp  05/06/18  1352  s Sarai January rn

## 2018-05-13 ENCOUNTER — Encounter: Payer: Self-pay | Admitting: Nurse Practitioner

## 2018-05-13 ENCOUNTER — Ambulatory Visit: Payer: Medicaid Other | Attending: Nurse Practitioner | Admitting: Nurse Practitioner

## 2018-05-13 DIAGNOSIS — Z79899 Other long term (current) drug therapy: Secondary | ICD-10-CM | POA: Insufficient documentation

## 2018-05-13 DIAGNOSIS — F200 Paranoid schizophrenia: Secondary | ICD-10-CM | POA: Diagnosis not present

## 2018-05-13 DIAGNOSIS — R1032 Left lower quadrant pain: Secondary | ICD-10-CM | POA: Diagnosis not present

## 2018-05-13 MED ORDER — OMEPRAZOLE 40 MG PO CPDR: 40.0000 mg | DELAYED_RELEASE_CAPSULE | Freq: Every day | ORAL | 1 refills | Status: DC

## 2018-05-13 NOTE — Patient Instructions (Signed)

## 2018-05-13 NOTE — Progress Notes (Signed)
Assessment & Plan:  Eleny was seen today for follow-up.  Diagnoses and all orders for this visit:  Left lower quadrant pain -     omeprazole (PRILOSEC) 40 MG capsule; Take 1 capsule (40 mg total) by mouth daily before supper. -     CMP14+EGFR -     CBC    Patient has been counseled on age-appropriate routine health concerns for screening and prevention. These are reviewed and up-to-date. Referrals have been placed accordingly. Immunizations are up-to-date or declined.    Subjective:   Chief Complaint  Patient presents with  . Follow-up    Pt. stated she is here to follow-up her stomach pain from the ED. Pt. stated she has been having headaches and think it may be her blood pressure.    HPI Riley Papin 37 y.o. female presents to office today for complaints of abdominal pain. She went to the ED on 05-03-2018 for abdominal pain however she left prior to being seen. She states to me "I was not going to sit there for 6 hours to die!". She appears to be under the influence of alcohol or some illicit substance today. She is lying down on the table asleep when I enter the room and her speech is slurred, eyes are red and there is a faint odor of alcohol in the room.   Abdominal Pain Patient complains of abdominal pain. The pain is described as shooting, stabbing and twisting, and is 7/10 in intensity. Pain is located in the LLQ without radiation. Onset was several days ago. Symptoms have been unchanged since. Aggravating factors: cold liquids.  Alleviating factors: none. Associated symptoms: none. The patient denies constipation, diarrhea, dysuria, fever, hematochezia, hematuria, melena, nausea and vomiting. She has omeprazole that has been prescribed for her but states "What is that for?" When I explained how it works she states "I didn't have no money for all that. I didn't get it but send it to the pharmacy and I will see if I can get it.".    Review of Systems  Constitutional:  Negative for fever, malaise/fatigue and weight loss.  HENT: Negative for nosebleeds.   Eyes: Negative for photophobia.  Respiratory: Negative.  Negative for cough and shortness of breath.   Cardiovascular: Negative.  Negative for chest pain, palpitations and leg swelling.  Gastrointestinal: Positive for abdominal pain. Negative for blood in stool, constipation, diarrhea, heartburn, melena, nausea and vomiting.  Genitourinary: Negative.   Musculoskeletal: Positive for back pain. Negative for myalgias.  Neurological: Negative.  Negative for dizziness, focal weakness, seizures and headaches.  Psychiatric/Behavioral: Negative.  Negative for suicidal ideas.    Past Medical History:  Diagnosis Date  . Arthritis   . Claustrophobia   . Cold    currently - no meds  . Fallopian tube abscess   . Fibroids   . Paranoid schizophrenia ([place])   . SVD (spontaneous vaginal delivery)    x 1    Past Surgical History:  Procedure Laterality Date  . BUNIONECTOMY    . bunyonectomy Bilateral    x 1  . LAPAROSCOPIC UNILATERAL SALPINGECTOMY Left 08/18/2016   Procedure: LAPAROSCOPIC UNILATERAL SALPINGECTOMY;  Surgeon: Lavonia Drafts, MD;  Location: [facility];  Service: Gynecology;  Laterality: Left;  . LAPAROSCOPY  08/18/2016   Procedure: LAPAROSCOPY OPERATIVE;  Surgeon: Lavonia Drafts, MD;  Location: [facility];  Service: Gynecology;;  . LAPAROTOMY  08/18/2016   Procedure: LAPAROTOMY;  Surgeon: Lavonia Drafts, MD;  Location: [facility];  Service: Gynecology;;  mini laparotomy  .  MYOMECTOMY  08/18/2016   Procedure: MYOMECTOMY;  Surgeon: Lavonia Drafts, MD;  Location: [facility];  Service: Gynecology;;  . TOOTH EXTRACTION      Family History  Problem Relation Age of Onset  . Hypertension Mother     Social History Reviewed with no changes to be made today.   Outpatient Medications Prior to Visit  Medication Sig Dispense Refill  . cloNIDine (CATAPRES) 0.1 MG tablet Take 1 tablet  (0.1 mg total) by mouth 3 (three) times daily. 90 tablet 3  . HYDROcodone-acetaminophen (NORCO/VICODIN) 5-325 MG tablet Take 1 tablet by mouth every 4 (four) hours as needed. (Patient not taking: Reported on 05/13/2018) 20 tablet 0  . hydrOXYzine (VISTARIL) 50 MG capsule Take 50 mg by mouth every 6 (six) hours as needed for anxiety.     Marland Kitchen loperamide (IMODIUM) 2 MG capsule Take 1 capsule (2 mg total) by mouth 4 (four) times daily as needed for diarrhea or loose stools. (Patient not taking: Reported on 05/13/2018) 3 capsule 0  . lurasidone (LATUDA) 40 MG TABS tablet Take 40 mg by mouth daily with breakfast.    . ondansetron (ZOFRAN ODT) 4 MG disintegrating tablet Take 1 tablet (4 mg total) by mouth every 8 (eight) hours as needed for nausea or vomiting. (Patient not taking: Reported on 05/13/2018) 2 tablet 0  . polyethylene glycol powder (GLYCOLAX/MIRALAX) powder Take 17 g by mouth 2 (two) times daily as needed. (Patient not taking: Reported on 11/16/2017) 3350 g 1  . traZODone (DESYREL) 100 MG tablet Take 200 mg by mouth at bedtime.     . cephALEXin (KEFLEX) 500 MG capsule Take 1 capsule (500 mg total) by mouth 3 (three) times daily. 21 capsule 0  . cephALEXin (KEFLEX) 500 MG capsule Take 1 capsule (500 mg total) by mouth 3 (three) times daily. 30 capsule 0  . mupirocin ointment (BACTROBAN) 2 % Apply 1 application topically 2 (two) times daily. (Patient not taking: Reported on 05/13/2018) 30 g 2  . omeprazole (PRILOSEC) 40 MG capsule Take 1 capsule (40 mg total) by mouth daily before supper. (Patient not taking: Reported on 10/29/2017) 30 capsule 0  . oxyCODONE-acetaminophen (PERCOCET) 5-325 MG tablet Take 1-2 tablets by mouth every 4 (four) hours as needed for severe pain. (Patient not taking: Reported on 05/13/2018) 15 tablet 0  . sulfamethoxazole-trimethoprim (BACTRIM) 400-80 MG tablet Take 1 tablet by mouth 2 (two) times daily. (Patient not taking: Reported on 05/13/2018) 20 tablet 0   No  facility-administered medications prior to visit.     Allergies  Allergen Reactions  . Citrus Hives and Itching  . Food Hives, Itching and Other (See Comments)    Pt confirmed that it is not an anaphylactic reaction  . Strawberry Flavor     Hives all over body.   . Toradol [Ketorolac Tromethamine] Hives and Itching       Objective:    BP 103/70 (BP Location: Right Arm, Patient Position: Sitting, Cuff Size: Normal)   Pulse 100   Temp 98.2 F (36.8 C) (Oral)   Ht '5\' 1"'  (1.549 m)   Wt 117 lb (53.1 kg)   LMP 04/18/2018   BMI 22.11 kg/m  Wt Readings from Last 3 Encounters:  05/13/18 117 lb (53.1 kg)  01/29/18 132 lb (59.9 kg)  12/08/17 136 lb 3.2 oz (61.8 kg)    Physical Exam  Constitutional: She is oriented to person, place, and time. She appears well-developed and well-nourished. She is cooperative.  HENT:  Head: Normocephalic and  atraumatic.  Cardiovascular: Normal rate, regular rhythm, normal heart sounds and intact distal pulses. Exam reveals no gallop and no friction rub.  No murmur heard. Pulmonary/Chest: Effort normal and breath sounds normal. No tachypnea. No respiratory distress. She has no decreased breath sounds. She has no wheezes. She has no rhonchi. She has no rales. She exhibits no tenderness.  Abdominal: Soft. Bowel sounds are normal. She exhibits no mass. There is generalized tenderness. There is no rigidity, no rebound, no guarding, no CVA tenderness, no tenderness at McBurney's point and negative Murphy's sign. No hernia.  Musculoskeletal: Normal range of motion. She exhibits no edema.  Neurological: She is alert and oriented to person, place, and time. Coordination normal.  Skin: Skin is warm and dry.  Psychiatric: She has a normal mood and affect. Her behavior is normal. Judgment and thought content normal.  Nursing note and vitals reviewed.      Patient has been counseled extensively about nutrition and exercise as well as the importance of adherence  with medications and regular follow-up. The patient was given clear instructions to go to ER or return to medical center if symptoms don't improve, worsen or new problems develop. The patient verbalized understanding.   Follow-up: Return in about 1 month (around 06/10/2018) for LLQ pain.   [name], FNP-BC [facility] [place], [place]   05/13/2018, 9:58 AM

## 2018-05-14 LAB — CMP14+EGFR
ALT: 25 IU/L (ref 0–32)
AST: 43 IU/L — AB (ref 0–40)
Albumin/Globulin Ratio: 1.8 (ref 1.2–2.2)
Albumin: 3.7 g/dL (ref 3.5–5.5)
Alkaline Phosphatase: 92 IU/L (ref 39–117)
BUN/Creatinine Ratio: 4 — ABNORMAL LOW (ref 9–23)
BUN: 3 mg/dL — ABNORMAL LOW (ref 6–20)
Bilirubin Total: 0.5 mg/dL (ref 0.0–1.2)
CO2: 27 mmol/L (ref 20–29)
CREATININE: 0.69 mg/dL (ref 0.57–1.00)
Calcium: 8.7 mg/dL (ref 8.7–10.2)
Chloride: 104 mmol/L (ref 96–106)
GFR calc Af Amer: 129 mL/min/{1.73_m2} (ref >59)
GFR calc non Af Amer: 112 mL/min/{1.73_m2} (ref >59)
GLUCOSE: 71 mg/dL (ref 65–99)
Globulin, Total: 2.1 g/dL (ref 1.5–4.5)
POTASSIUM: 3.7 mmol/L (ref 3.5–5.2)
Sodium: 146 mmol/L — ABNORMAL HIGH (ref 134–144)
Total Protein: 5.8 g/dL — ABNORMAL LOW (ref 6.0–8.5)

## 2018-05-14 LAB — CBC
HEMOGLOBIN: 10.9 g/dL — AB (ref 11.1–15.9)
Hematocrit: 32.9 % — ABNORMAL LOW (ref 34.0–46.6)
MCH: 33.7 pg — ABNORMAL HIGH (ref 26.6–33.0)
MCHC: 33.1 g/dL (ref 31.5–35.7)
MCV: 102 fL — ABNORMAL HIGH (ref 79–97)
PLATELETS: 323 10*3/uL (ref 150–450)
RBC: 3.23 x10E6/uL — AB (ref 3.77–5.28)
RDW: 14.5 % (ref 12.3–15.4)
WBC: 5.8 10*3/uL (ref 3.4–10.8)

## 2018-05-19 ENCOUNTER — Telehealth: Payer: Self-pay

## 2018-05-19 NOTE — Telephone Encounter (Signed)
-----   Message from [name], NP sent at 05/16/2018  8:29 PM EDT ----- Potassium is normal. Your CBC continues to show anemia likely related to your cycles. I would recommend over the counter iron tablets every other day. You can take ferrous sulfate 325mg  3 days per week.

## 2018-05-19 NOTE — Telephone Encounter (Signed)
CMA spoke to patient to inform on lab results.  Patient verified DOB. Patient understood.  

## 2018-06-14 ENCOUNTER — Ambulatory Visit: Payer: Medicaid Other | Admitting: Nurse Practitioner

## 2018-07-07 ENCOUNTER — Ambulatory Visit: Payer: Medicaid Other

## 2018-07-11 ENCOUNTER — Ambulatory Visit: Payer: Medicaid Other | Admitting: Podiatry

## 2018-07-25 ENCOUNTER — Ambulatory Visit: Payer: Medicaid Other

## 2018-07-25 ENCOUNTER — Ambulatory Visit (INDEPENDENT_AMBULATORY_CARE_PROVIDER_SITE_OTHER): Payer: Medicaid Other

## 2018-07-25 ENCOUNTER — Encounter

## 2018-07-25 ENCOUNTER — Ambulatory Visit: Payer: Medicaid Other | Admitting: Podiatry

## 2018-07-25 DIAGNOSIS — Z969 Presence of functional implant, unspecified: Secondary | ICD-10-CM

## 2018-07-25 DIAGNOSIS — M79671 Pain in right foot: Secondary | ICD-10-CM

## 2018-07-25 DIAGNOSIS — M2011 Hallux valgus (acquired), right foot: Secondary | ICD-10-CM | POA: Diagnosis not present

## 2018-07-25 DIAGNOSIS — M2012 Hallux valgus (acquired), left foot: Secondary | ICD-10-CM

## 2018-07-25 NOTE — Patient Instructions (Signed)

## 2018-07-26 ENCOUNTER — Other Ambulatory Visit: Payer: Self-pay | Admitting: Podiatry

## 2018-07-26 ENCOUNTER — Telehealth: Payer: Self-pay | Admitting: *Deleted

## 2018-07-26 DIAGNOSIS — Z969 Presence of functional implant, unspecified: Secondary | ICD-10-CM

## 2018-07-26 NOTE — Progress Notes (Signed)
Subjective: 37 year old female presents the office today for concerns of painful screw in her left foot.  She states that this point she wants to have the screw removed on the left foot.  She has recently done the right foot and she states that her pain much improved.  She states that she can feel the screw times and she feels some pain on the top of the foot.  She also gets sharp pain at times.  At this point she wants to have this removed. Denies any systemic complaints such as fevers, chills, nausea, vomiting. No acute changes since last appointment, and no other complaints at this time.   Objective: AAO x3, NAD DP/PT pulses palpable bilaterally, CRT less than 3 seconds Mildly palpable prominence of the dorsal first metatarsal head on the area of the fixation.  There is no pain with MPJ range of motion there is no crepitation.  There is no other areas of discomfort.  Subjectively she is also getting some sharp pains in the area as well. No open lesions or pre-ulcerative lesions.  No pain with calf compression, swelling, warmth, erythema  Assessment: 37 year old female retained hardware left  Plan: -All treatment options discussed with the patient including all alternatives, risks, complications.  -X-rays were obtained and reviewed with the patient. Status post bunionectomy with screw fixation.  No evidence of acute fracture. -At this point she was to have this removed.  We discussed the surgery as well as postoperative course and she wished to proceed with the surgery at her request.  Discussed is not a guarantee that the symptoms did not improve. -The incision placement as well as the postoperative course was discussed with the patient. I discussed risks of the surgery which include, but not limited to, infection, bleeding, pain, swelling, need for further surgery, delayed or nonhealing, painful or ugly scar, numbness or sensation changes, over/under correction, recurrence, transfer lesions,  further deformity, hardware failure, DVT/PE, loss of toe/foot. Patient understands these risks and wishes to proceed with surgery. The surgical consent was reviewed with the patient all 3 pages were signed. No promises or guarantees were given to the outcome of the procedure. All questions were answered to the best of my ability. Before the surgery the patient was encouraged to call the office if there is any further questions. The surgery will be performed at the [facility] on an outpatient basis. -Patient encouraged to call the office with any questions, concerns, change in symptoms.   Trula Slade DPM

## 2018-07-26 NOTE — Telephone Encounter (Signed)
"  I'm calling to confirm my appointment on the sixteenth for my foot surgery.  I need to know what time I need to be there.  Thank you, have a blessed day."  I am returning your call.  Someone from the surgical center will call you a day or two prior to your surgery date.  They will give you your arrival time.  "Oh okay, will they leave me a message if I don't answer?"  Yes, they will leave you a message.

## 2018-08-03 ENCOUNTER — Encounter: Payer: Self-pay | Admitting: Podiatry

## 2018-08-03 ENCOUNTER — Other Ambulatory Visit: Payer: Self-pay | Admitting: Podiatry

## 2018-08-03 ENCOUNTER — Telehealth: Payer: Self-pay | Admitting: Podiatry

## 2018-08-03 DIAGNOSIS — M2012 Hallux valgus (acquired), left foot: Secondary | ICD-10-CM

## 2018-08-03 MED ORDER — OXYCODONE-ACETAMINOPHEN 5-325 MG PO TABS: 1.0000 | ORAL_TABLET | Freq: Four times a day (QID) | ORAL | 0 refills | Status: DC | PRN

## 2018-08-03 MED ORDER — CEPHALEXIN 500 MG PO CAPS: 500.0000 mg | ORAL_CAPSULE | Freq: Three times a day (TID) | ORAL | 0 refills | Status: DC

## 2018-08-03 NOTE — Telephone Encounter (Signed)
This is Jessica Case calling back from [place]. I called earlier about the prescribing doctor's DEA number was inactive. The new prescriber's DEA number is also coming up inactive. So I don't know if something is going on. You may need to call and see if their DEA numbers have been revoked. Our phone number again is [phone]. Thank you.

## 2018-08-03 NOTE — Addendum Note (Signed)
Addended by: Hardie Pulley on: 08/03/2018 01:18 PM   Modules accepted: Orders

## 2018-08-03 NOTE — Telephone Encounter (Signed)
Sorry about that. If you do need to contact me, my direct number is [phone].

## 2018-08-03 NOTE — Telephone Encounter (Signed)
Called pt to see if the pharmacy has filled her keflex prescription while we straighten out her percocet getting filled. Pt stated she has not gotten either medication. I told her we are working on it and the pharmacy should call her when the medication has been filled and is ready to pick up.

## 2018-08-03 NOTE — Telephone Encounter (Signed)
I've already had Dr. March Rummage take care of it.

## 2018-08-03 NOTE — Progress Notes (Signed)
Post op medications sent to pharmacy 

## 2018-08-03 NOTE — Telephone Encounter (Signed)
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 We received a prescription for oxycodone. Our system is indicating that Dr. Leigh Aurora DEA number is no longer active so we were unable to fill this prescription. If you would re-send that prescription under a different provider. Our phone number is [phone].

## 2018-08-03 NOTE — Telephone Encounter (Signed)
Thanks. I was trying to call you but couldn't get through

## 2018-08-03 NOTE — Telephone Encounter (Signed)
Dr. Amalia Hailey refilled the mediation

## 2018-08-03 NOTE — Telephone Encounter (Signed)
Can you please have Dr. Amalia Hailey or Dr. March Rummage resend these ASAP? Thanks.

## 2018-08-04 ENCOUNTER — Telehealth: Payer: Self-pay | Admitting: *Deleted

## 2018-08-04 NOTE — Telephone Encounter (Signed)
Called and spoke with the patient and patient stated that she was doing good and the pain medicine was working and taken the antibiotics and there are not any fever or chills and no nausea and I stated to the patient to call the office if any concerns or questions. Lattie Haw

## 2018-08-08 ENCOUNTER — Ambulatory Visit (INDEPENDENT_AMBULATORY_CARE_PROVIDER_SITE_OTHER): Payer: Medicaid Other

## 2018-08-08 ENCOUNTER — Ambulatory Visit (INDEPENDENT_AMBULATORY_CARE_PROVIDER_SITE_OTHER): Payer: Medicaid Other | Admitting: Podiatry

## 2018-08-08 ENCOUNTER — Telehealth: Payer: Self-pay | Admitting: *Deleted

## 2018-08-08 DIAGNOSIS — Z9889 Other specified postprocedural states: Secondary | ICD-10-CM

## 2018-08-08 DIAGNOSIS — M2012 Hallux valgus (acquired), left foot: Secondary | ICD-10-CM

## 2018-08-08 MED ORDER — OXYCODONE-ACETAMINOPHEN 5-325 MG PO TABS: 1.0000 | ORAL_TABLET | Freq: Four times a day (QID) | ORAL | 0 refills | Status: DC | PRN

## 2018-08-08 NOTE — Telephone Encounter (Signed)
I informed pt of the problem and she states she could go to the [facility] in the same area. I changed to the [id number]. Dr. Jacqualyn Posey resent percocet to [facility].

## 2018-08-08 NOTE — Telephone Encounter (Signed)
Jessica Case - [facility] states Dr. Leigh Aurora DEA is not eligible for narcotic orders.

## 2018-08-11 NOTE — Progress Notes (Signed)
Subjective: Jessica Case is a 37 y.o. is seen today in office s/p left foot hardware removal preformed on 08/03/2018.  She states that she is having some throbbing and tingling pain.  She is asking for refill pain medicine she brought into bottle.  She states that she still having pain to the area.  She does state it is getting better.  She is been a surgical shoe but she has been quite active on her foot.  Denies any systemic complaints such as fevers, chills, nausea, vomiting. No calf pain, chest pain, shortness of breath.   Objective: General: No acute distress, AAOx3  DP/PT pulses palpable 2/4, CRT < 3 sec to all digits.  Protective sensation intact. Motor function intact.  LEFT foot: Incision is well coapted without any evidence of dehiscence and sutures are intact. There is no surrounding erythema, ascending cellulitis, fluctuance, crepitus, malodor, drainage/purulence. There is minimal edema around the surgical site. There is mild pain along the surgical site.  No other areas of tenderness to bilateral lower extremities.  No other open lesions or pre-ulcerative lesions.  No pain with calf compression, swelling, warmth, erythema.   Assessment and Plan:  Status post left foot hardware removal, doing well with no complications   -Treatment options discussed including all alternatives, risks, and complications -X-rays were obtained and reviewed.  No evidence of acute fracture.  Hardware removed. -Antibiotic ointment and a bandage was applied to the incision.  Keep the dressing clean, dry, intact. -Remain in surgical shoe.  Limit activity. -Ice/elevation -Pain medication as needed.  Refill today.  We will decrease pain medicine next time. -Monitor for any clinical signs or symptoms of infection and DVT/PE and directed to call the office immediately should any occur or go to the ER. -Follow-up in 10 days for suture removal or sooner if any problems arise. In the meantime, encouraged to  call the office with any questions, concerns, change in symptoms.   [name], DPM

## 2018-08-18 ENCOUNTER — Encounter: Payer: Medicaid Other | Admitting: Podiatry

## 2018-08-19 ENCOUNTER — Ambulatory Visit (INDEPENDENT_AMBULATORY_CARE_PROVIDER_SITE_OTHER): Payer: Medicaid Other | Admitting: Podiatry

## 2018-08-19 ENCOUNTER — Encounter: Payer: Self-pay | Admitting: Podiatry

## 2018-08-19 DIAGNOSIS — Z969 Presence of functional implant, unspecified: Secondary | ICD-10-CM

## 2018-08-21 NOTE — Progress Notes (Signed)
Subjective: Jessica Case is a 37 y.o. is seen today in office s/p left foot hardware removal preformed on 08/03/2018.  She states that she is doing well she is ready to get the stitches taken on the left foot she is wants to go back to work tomorrow.  Is having no pain she overall she is feeling much better.  She denies any recent injury or trauma.  She is been active but she is been wearing a surgical shoe. Denies any systemic complaints such as fevers, chills, nausea, vomiting. No calf pain, chest pain, shortness of breath.   Objective: General: No acute distress, AAOx3  DP/PT pulses palpable 2/4, CRT < 3 sec to all digits.  Protective sensation intact. Motor function intact.  LEFT foot: Incision is well coapted without any evidence of dehiscence and sutures are intact.  Incision appears to be healing well with any signs of infection.  There is no surrounding erythema, ascending cellulitis, fluctuance, crepitus, malodor, drainage/purulence. There is trace edema around the surgical site. There is no pain along the surgical site.  No other areas of tenderness to bilateral lower extremities.  No other open lesions or pre-ulcerative lesions.  No pain with calf compression, swelling, warmth, erythema.   Assessment and Plan:  Status post left foot hardware removal, doing well with no complications   -Treatment options discussed including all alternatives, risks, and complications -Incision appears to be healing well.  Sutures removed without complications and after removal incision remained well coapted.  Steri-Strips were applied for reinforcement followed by antibiotic ointment and a bandage.  She can start to shower tomorrow.  She is slowly transition back to regular shoe.  I want her to wear compression anklet when she works.  She can return to work tomorrow note was provided.  At this point were to follow-up with her as needed but she is encouraged to call any questions or concerns or any  changes and she agrees with this plan today.  Trula Slade DPM

## 2018-09-06 ENCOUNTER — Encounter: Payer: Self-pay | Admitting: Nurse Practitioner

## 2018-09-06 ENCOUNTER — Ambulatory Visit: Payer: Medicaid Other | Attending: Nurse Practitioner | Admitting: Nurse Practitioner

## 2018-09-06 VITALS — BP 144/98 | HR 93 | Temp 98.5°F | Ht 61.0 in | Wt 111.6 lb

## 2018-09-06 DIAGNOSIS — Z79899 Other long term (current) drug therapy: Secondary | ICD-10-CM | POA: Diagnosis not present

## 2018-09-06 DIAGNOSIS — Z8249 Family history of ischemic heart disease and other diseases of the circulatory system: Secondary | ICD-10-CM | POA: Insufficient documentation

## 2018-09-06 DIAGNOSIS — F200 Paranoid schizophrenia: Secondary | ICD-10-CM | POA: Diagnosis not present

## 2018-09-06 DIAGNOSIS — I10 Essential (primary) hypertension: Secondary | ICD-10-CM | POA: Insufficient documentation

## 2018-09-06 DIAGNOSIS — F17210 Nicotine dependence, cigarettes, uncomplicated: Secondary | ICD-10-CM | POA: Diagnosis not present

## 2018-09-06 DIAGNOSIS — R634 Abnormal weight loss: Secondary | ICD-10-CM

## 2018-09-06 MED ORDER — CLONIDINE HCL 0.1 MG PO TABS: 0.1000 mg | ORAL_TABLET | Freq: Three times a day (TID) | ORAL | 3 refills | Status: DC

## 2018-09-06 NOTE — Progress Notes (Addendum)
Assessment & Plan:  Jessica Case was seen today for follow-up.  Diagnoses and all orders for this visit:  Essential hypertension -     cloNIDine (CATAPRES) 0.1 MG tablet; Take 1 tablet (0.1 mg total) by mouth 3 (three) times daily. Continue all antihypertensives as prescribed.  Remember to bring in your blood pressure log with you for your follow up appointment.  DASH/Mediterranean Diets are healthier choices for HTN.   Weight loss -     TSH    Patient has been counseled on age-appropriate routine health concerns for screening and prevention. These are reviewed and up-to-date. Referrals have been placed accordingly. Immunizations are up-to-date or declined.    Subjective:   Chief Complaint  Patient presents with  . Follow-up    Pt. stated her abdominal pain is better. Patient is here to follow-up on hypertension.    HPI Jessica Case 37 y.o. female presents to office today for follow up to HTN.  Essential Hypertension Poorly controlled. She is medication non adherent. Reports she ran out of her medications and that she doesn't take them every day anyway. She continues to smoke cigarettes. He weight is down. She endorses increased stress and poor appetite due to her current living situation and  Relationship issues. She has lost over 20lbs unintentionally in 6 months. She denies chest pain, shortness of breath, palpitations, lightheadedness, dizziness, headaches or BLE edema.  I have explained to her that it is difficult to ascertain her true blood pressure and need for additional antihypertensives when she is not compliant with her current medications and continues to smoke. Especially when she will smoke a cigarette immediately prior to her office visit.  BP Readings from Last 3 Encounters:  09/06/18 (!) 144/98  05/13/18 103/70  05/03/18 112/62    Review of Systems  Constitutional: Positive for weight loss. Negative for fever and malaise/fatigue.  HENT: Negative.  Negative for  nosebleeds.   Eyes: Negative.  Negative for blurred vision, double vision and photophobia.  Respiratory: Negative.  Negative for cough and shortness of breath.   Cardiovascular: Negative.  Negative for chest pain, palpitations and leg swelling.  Gastrointestinal: Negative.  Negative for heartburn, nausea and vomiting.  Musculoskeletal: Negative.  Negative for myalgias.  Neurological: Negative.  Negative for dizziness, focal weakness, seizures and headaches.  Psychiatric/Behavioral: Negative.  Negative for suicidal ideas.       CLAUSTROPHOBIA    Past Medical History:  Diagnosis Date  . Arthritis   . Claustrophobia   . Cold    currently - no meds  . Fallopian tube abscess   . Fibroids   . Paranoid schizophrenia ([place])   . SVD (spontaneous vaginal delivery)    x 1    Past Surgical History:  Procedure Laterality Date  . BUNIONECTOMY    . bunyonectomy Bilateral    x 1  . LAPAROSCOPIC UNILATERAL SALPINGECTOMY Left 08/18/2016   Procedure: LAPAROSCOPIC UNILATERAL SALPINGECTOMY;  Surgeon: Lavonia Drafts, MD;  Location: [facility];  Service: Gynecology;  Laterality: Left;  . LAPAROSCOPY  08/18/2016   Procedure: LAPAROSCOPY OPERATIVE;  Surgeon: Lavonia Drafts, MD;  Location: [facility];  Service: Gynecology;;  . LAPAROTOMY  08/18/2016   Procedure: LAPAROTOMY;  Surgeon: Lavonia Drafts, MD;  Location: [facility];  Service: Gynecology;;  mini laparotomy  . MYOMECTOMY  08/18/2016   Procedure: MYOMECTOMY;  Surgeon: Lavonia Drafts, MD;  Location: [facility];  Service: Gynecology;;  . TOOTH EXTRACTION      Family History  Problem Relation Age of Onset  .  Hypertension Mother     Social History Reviewed with no changes to be made today.   Outpatient Medications Prior to Visit  Medication Sig Dispense Refill  . hydrOXYzine (VISTARIL) 50 MG capsule Take 50 mg by mouth every 6 (six) hours as needed for anxiety.     Marland Kitchen lurasidone (LATUDA) 40 MG TABS tablet Take 40 mg by mouth  daily with breakfast.    . traZODone (DESYREL) 100 MG tablet Take 200 mg by mouth at bedtime.     . cloNIDine (CATAPRES) 0.1 MG tablet Take 1 tablet (0.1 mg total) by mouth 3 (three) times daily. 90 tablet 3  . omeprazole (PRILOSEC) 40 MG capsule Take 1 capsule (40 mg total) by mouth daily before supper. 90 capsule 1  . polyethylene glycol powder (GLYCOLAX/MIRALAX) powder Take 17 g by mouth 2 (two) times daily as needed. (Patient not taking: Reported on 11/16/2017) 3350 g 1  . cephALEXin (KEFLEX) 500 MG capsule Take 1 capsule (500 mg total) by mouth 3 (three) times daily. (Patient not taking: Reported on 09/06/2018) 21 capsule 0  . loperamide (IMODIUM) 2 MG capsule Take 1 capsule (2 mg total) by mouth 4 (four) times daily as needed for diarrhea or loose stools. (Patient not taking: Reported on 05/13/2018) 3 capsule 0  . oxyCODONE-acetaminophen (PERCOCET/ROXICET) 5-325 MG tablet Take 1-2 tablets by mouth every 6 (six) hours as needed for severe pain. (Patient not taking: Reported on 09/06/2018) 15 tablet 0   No facility-administered medications prior to visit.     Allergies  Allergen Reactions  . Citrus Hives and Itching  . Food Hives, Itching and Other (See Comments)    Pt confirmed that it is not an anaphylactic reaction  . Strawberry Flavor     Hives all over body.   . Toradol [Ketorolac Tromethamine] Hives and Itching       Objective:    BP (!) 144/98 (BP Location: Right Arm, Patient Position: Sitting, Cuff Size: Normal)   Pulse 93   Temp 98.5 F (36.9 C) (Oral)   Ht 5\' 1"  (1.549 m)   Wt 111 lb 9.6 oz (50.6 kg)   LMP 08/21/2018   SpO2 98%   BMI 21.09 kg/m  Wt Readings from Last 3 Encounters:  09/06/18 111 lb 9.6 oz (50.6 kg)  05/13/18 117 lb (53.1 kg)  01/29/18 132 lb (59.9 kg)    Physical Exam  Constitutional: She is oriented to person, place, and time. She is cooperative.  HENT:  Head: Normocephalic and atraumatic.  Eyes: EOM are normal.  Neck: Normal range of motion.   Cardiovascular: Normal rate, regular rhythm and normal heart sounds. Exam reveals no gallop and no friction rub.  No murmur heard. Pulmonary/Chest: Effort normal and breath sounds normal. No tachypnea. No respiratory distress. She has no decreased breath sounds. She has no wheezes. She has no rhonchi. She has no rales. She exhibits no tenderness.  Abdominal: Bowel sounds are normal.  Musculoskeletal: Normal range of motion. She exhibits no edema.  Neurological: She is alert and oriented to person, place, and time. Coordination normal.  Skin: Skin is warm and dry.  Psychiatric: She has a normal mood and affect. Her behavior is normal. Judgment and thought content normal.  Nursing note and vitals reviewed.     Patient has been counseled extensively about nutrition and exercise as well as the importance of adherence with medications and regular follow-up. The patient was given clear instructions to go to ER or return to medical center if  symptoms don't improve, worsen or new problems develop. The patient verbalized understanding.   Follow-up: Return in about 4 weeks (around 10/04/2018) for BP recheck.   [name], FNP-BC [facility] and [facility], [place]   09/06/2018, 2:36 PM

## 2018-09-07 LAB — TSH: TSH: 1.33 u[IU]/mL (ref 0.450–4.500)

## 2018-09-12 ENCOUNTER — Telehealth: Payer: Self-pay

## 2018-09-12 NOTE — Telephone Encounter (Signed)
-----   Message from [name], NP sent at 09/11/2018 11:53 PM EST ----- Thyroid level is normal

## 2018-09-12 NOTE — Telephone Encounter (Signed)
CMA spoke to patient to inform on results.  Pt. Verified DOB. Pt. Understood.  

## 2018-09-13 ENCOUNTER — Telehealth: Payer: Self-pay | Admitting: Podiatry

## 2018-09-13 NOTE — Telephone Encounter (Signed)
Left voicemail that I had just faxed requested medical records and if they wanted them faxed to call me back at [phone] and let me know.

## 2018-09-13 NOTE — Telephone Encounter (Signed)
Peter with [employer] calling to check the status of the medical records request on Jessica Case. Request was faxed on 14 November. Please call us back at [phone].

## 2018-09-13 NOTE — Telephone Encounter (Signed)
Jessica Case with [employer]

## 2018-10-04 ENCOUNTER — Ambulatory Visit: Payer: Medicaid Other | Attending: Nurse Practitioner | Admitting: Pharmacist

## 2018-10-04 VITALS — BP 121/83 | HR 83

## 2018-10-04 DIAGNOSIS — F17290 Nicotine dependence, other tobacco product, uncomplicated: Secondary | ICD-10-CM | POA: Insufficient documentation

## 2018-10-04 DIAGNOSIS — I10 Essential (primary) hypertension: Secondary | ICD-10-CM | POA: Insufficient documentation

## 2018-10-04 DIAGNOSIS — Z7289 Other problems related to lifestyle: Secondary | ICD-10-CM | POA: Diagnosis not present

## 2018-10-04 DIAGNOSIS — F1490 Cocaine use, unspecified, uncomplicated: Secondary | ICD-10-CM | POA: Diagnosis not present

## 2018-10-04 DIAGNOSIS — F1290 Cannabis use, unspecified, uncomplicated: Secondary | ICD-10-CM | POA: Insufficient documentation

## 2018-10-04 DIAGNOSIS — Z79899 Other long term (current) drug therapy: Secondary | ICD-10-CM | POA: Insufficient documentation

## 2018-10-04 MED ORDER — AMLODIPINE BESYLATE 5 MG PO TABS: 5.0000 mg | ORAL_TABLET | Freq: Every day | ORAL | 2 refills | Status: DC

## 2018-10-04 NOTE — Progress Notes (Signed)
   S:    PCP: Zelda  Patient arrives good spirits. Suspicion for pt being under the influence of drugs or alcohol. When I asked her if she has consumed anything, she reports having a drink at 7 AM and smoking a cigar right before seeing me.     Pt is here for a BP check at the request of Zelda. Zelda saw the patient on 09/06/18 and BP was 144/98 at that visit. No changes were made at that visit.   Pt denies chest pain, shortness of breath, headache, or blurred vision. Patient denies adherence with medications.  Current BP Medications include:   - clonidine 0.1 mg TID  Dietary habits include: does not limit salt, consumes caffeine  Exercise habits include:does not exercise Family / Social history: HTN (mother), current everyday smoker, uses alcohol daily, uses crack cocaine and marijuana   Home BP readings: does not take at home  O:  L arm: 121/83, HR 83  Last 3 Office BP readings: BP Readings from Last 3 Encounters:  09/06/18 (!) 144/98  05/13/18 103/70  05/03/18 112/62   BMET    Component Value Date/Time   NA 146 (H) 05/13/2018 1019   K 3.7 05/13/2018 1019   CL 104 05/13/2018 1019   CO2 27 05/13/2018 1019   GLUCOSE 71 05/13/2018 1019   GLUCOSE 168 (H) 05/03/2018 1241   BUN 3 (L) 05/13/2018 1019   CREATININE 0.69 05/13/2018 1019   CALCIUM 8.7 05/13/2018 1019   GFRNONAA 112 05/13/2018 1019   GFRAA 129 05/13/2018 1019   Renal function: CrCl cannot be calculated (Patient's most recent lab result is older than the maximum 21 days allowed.).  Clinical ASCVD: No  The ASCVD Risk score Mikey Bussing DC Jr., et al., 2013) failed to calculate for the following reasons:   The 2013 ASCVD risk score is only valid for ages 16 to 78  A/P: Hypertension longstanding currently uncontrolled on current medications. BP Goal <130/80 mmHg. Patient is not adherent with current medications.   Discussed her situation with her PCP. She has tried HCTZ before but could not tolerate. She has not taken  her clonidine for several days. I will stop her clonidine and start her on amlodipine 5 mg daily.  -Started amlodipine 5 mg daily.  -Counseled on lifestyle modifications for blood pressure control including reduced dietary sodium, increased exercise, adequate sleep  Results reviewed and written information provided. Total time in face-to-face counseling 15 minutes.   F/U Clinic Visit in 1 month.    Benard Halsted, PharmD, [place] [phone]

## 2018-10-04 NOTE — Patient Instructions (Signed)
Thank you for coming to see [place] today.   Blood pressure today is improving.  Stop clonidine. Start taking amlodipine 5 mg: 1 tablet daily before bed.   Limiting salt and caffeine, as well as exercising as able for at least 30 minutes for 5 days out of the week, can also help you lower your blood pressure.  Take your blood pressure at home if you are able. Please write down these numbers and bring them to your visits.  If you have any questions about medications, please call me [phone].  Lurena Joiner

## 2018-10-05 ENCOUNTER — Encounter: Payer: Self-pay | Admitting: Pharmacist

## 2018-11-02 ENCOUNTER — Ambulatory Visit: Payer: Medicaid Other | Attending: Nurse Practitioner | Admitting: Pharmacist

## 2018-11-02 VITALS — BP 114/82 | HR 93

## 2018-11-02 DIAGNOSIS — I10 Essential (primary) hypertension: Secondary | ICD-10-CM | POA: Diagnosis not present

## 2018-11-02 DIAGNOSIS — F17200 Nicotine dependence, unspecified, uncomplicated: Secondary | ICD-10-CM | POA: Insufficient documentation

## 2018-11-02 DIAGNOSIS — Z8249 Family history of ischemic heart disease and other diseases of the circulatory system: Secondary | ICD-10-CM | POA: Insufficient documentation

## 2018-11-02 NOTE — Progress Notes (Signed)
   S:    PCP: Zelda  Patient arrives in good spirits. Pt is here for a BP check at the request of Zelda. Zelda saw the patient on 09/06/18. I last saw her on 10/04/18. We stopped her clonidine and started amlodipine 5 mg.   Pt denies chest pain, shortness of breath, headache, or blurred vision. Patient reports adherence with medications.  Current BP Medications include:   - amlodipine 5 mg daily  Dietary habits include: does not limit salt, consumes caffeine  Exercise habits include:does not exercise Family / Social history: HTN (mother), current everyday smoker, uses alcohol daily, uses crack cocaine and marijuana   Home BP readings: does not take at home  O:  L arm: 114/82, HR 93  Last 3 Office BP readings: BP Readings from Last 3 Encounters:  10/04/18 121/83  09/06/18 (!) 144/98  05/13/18 103/70   BMET    Component Value Date/Time   NA 146 (H) 05/13/2018 1019   K 3.7 05/13/2018 1019   CL 104 05/13/2018 1019   CO2 27 05/13/2018 1019   GLUCOSE 71 05/13/2018 1019   GLUCOSE 168 (H) 05/03/2018 1241   BUN 3 (L) 05/13/2018 1019   CREATININE 0.69 05/13/2018 1019   CALCIUM 8.7 05/13/2018 1019   GFRNONAA 112 05/13/2018 1019   GFRAA 129 05/13/2018 1019   Renal function: CrCl cannot be calculated (Patient's most recent lab result is older than the maximum 21 days allowed.).  Clinical ASCVD: No  The ASCVD Risk score Mikey Bussing DC Jr., et al., 2013) failed to calculate for the following reasons:   The 2013 ASCVD risk score is only valid for ages 71 to 4  A/P: Hypertension longstanding currently uncontrolled given her DBP of 82. Pt is not amenable to increasing dose and she is close to goal. BP Goal <130/80 mmHg. Patient is adherent with current medications.    -Continued amlodipine 5 mg daily.  -Counseled on lifestyle modifications for blood pressure control including reduced dietary sodium, increased exercise, adequate sleep  Results reviewed and written information provided.  Total time in face-to-face counseling 15 minutes.   F/U w/ PCP  Benard Halsted, PharmD, [place] [phone]

## 2018-11-03 ENCOUNTER — Encounter: Payer: Self-pay | Admitting: Pharmacist

## 2018-11-03 NOTE — Patient Instructions (Signed)

## 2018-11-16 ENCOUNTER — Encounter: Payer: Medicaid Other | Admitting: Pharmacist

## 2018-11-24 ENCOUNTER — Ambulatory Visit: Payer: Medicaid Other | Attending: Nurse Practitioner | Admitting: *Deleted

## 2018-11-24 DIAGNOSIS — Z111 Encounter for screening for respiratory tuberculosis: Secondary | ICD-10-CM

## 2018-11-24 NOTE — Progress Notes (Signed)
PPD Placement note Jessica Case, 38 y.o. female is here today for placement of PPD test Reason for PPD test: employment Pt taken PPD test before: yes  Verified in allergy area and with patient that they are not allergic to the products PPD is made of (Phenol or Tween). Yes Is patient taking any oral or IV steroid medication now or have they taken it in the last month? no Has the patient ever received the BCG vaccine?: no Has the patient been in recent contact with anyone known or suspected of having active TB disease?: no      PPD placed on 11/25/2018.  Patient advised to return for reading within 72 hours ( Monday, 11/28/2018). Pt verbalized understanding.

## 2018-11-25 ENCOUNTER — Ambulatory Visit: Payer: Medicaid Other | Attending: Family Medicine | Admitting: *Deleted

## 2018-11-25 DIAGNOSIS — Z111 Encounter for screening for respiratory tuberculosis: Secondary | ICD-10-CM | POA: Diagnosis present

## 2018-11-25 NOTE — Progress Notes (Signed)
Patient arrived at clinic to get PPD test done.  Patient was given PPD in right arm. Patient was informed to return on 11/28/18 at 10:00 to get reading.

## 2018-11-28 ENCOUNTER — Ambulatory Visit: Payer: Medicaid Other | Attending: Family Medicine | Admitting: *Deleted

## 2018-11-28 DIAGNOSIS — Z111 Encounter for screening for respiratory tuberculosis: Secondary | ICD-10-CM | POA: Insufficient documentation

## 2018-11-28 NOTE — Progress Notes (Signed)
PPD Reading Note  PPD read and results entered in EpicCare.  Result: 0 mm induration.  Interpretation: negative  Allergic reaction: no

## 2018-12-12 ENCOUNTER — Other Ambulatory Visit (HOSPITAL_COMMUNITY)
Admission: RE | Admit: 2018-12-12 | Discharge: 2018-12-12 | Disposition: A | Discharge status: Home / Self Care | Payer: Medicaid Other | Source: Ambulatory Visit | Attending: Nurse Practitioner | Admitting: Nurse Practitioner

## 2018-12-12 ENCOUNTER — Ambulatory Visit: Payer: Medicaid Other | Attending: Nurse Practitioner | Admitting: Nurse Practitioner

## 2018-12-12 ENCOUNTER — Encounter: Payer: Self-pay | Admitting: Nurse Practitioner

## 2018-12-12 VITALS — BP 146/92 | HR 79 | Temp 98.4°F | Ht 61.0 in | Wt 113.8 lb

## 2018-12-12 DIAGNOSIS — I10 Essential (primary) hypertension: Secondary | ICD-10-CM | POA: Diagnosis not present

## 2018-12-12 DIAGNOSIS — R109 Unspecified abdominal pain: Secondary | ICD-10-CM

## 2018-12-12 DIAGNOSIS — N390 Urinary tract infection, site not specified: Secondary | ICD-10-CM

## 2018-12-12 LAB — POCT URINALYSIS DIP (CLINITEK)
Blood, UA: NEGATIVE
GLUCOSE UA: NEGATIVE mg/dL
LEUKOCYTES UA: NEGATIVE
Nitrite, UA: POSITIVE — AB
POC PROTEIN,UA: 30 — AB
Spec Grav, UA: 1.015 (ref 1.010–1.025)
Urobilinogen, UA: 0.2 E.U./dL
pH, UA: 5.5 (ref 5.0–8.0)

## 2018-12-12 MED ORDER — AMLODIPINE BESYLATE 5 MG PO TABS: 5.0000 mg | ORAL_TABLET | Freq: Every day | ORAL | 2 refills | Status: DC

## 2018-12-12 MED ORDER — SULFAMETHOXAZOLE-TRIMETHOPRIM 800-160 MG PO TABS: 1.0000 | ORAL_TABLET | Freq: Two times a day (BID) | ORAL | 0 refills | Status: AC

## 2018-12-12 NOTE — Progress Notes (Signed)
Assessment & Plan:  Jessica Case was seen today for abdominal pain.  Diagnoses and all orders for this visit:  Abdominal pain, unspecified abdominal location -     POCT URINALYSIS DIP (CLINITEK) -     US PELVIC COMPLETE WITH TRANSVAGINAL; Future -     Urine cytology ancillary only -     CBC -     CMP14+EGFR  Urinary tract infection without hematuria, site unspecified -     sulfamethoxazole-trimethoprim (BACTRIM DS,SEPTRA DS) 800-160 MG tablet; Take 1 tablet by mouth 2 (two) times daily for 3 days. -     CULTURE, URINE COMPREHENSIVE  Essential hypertension -     amLODipine (NORVASC) 5 MG tablet; Take 1 tablet (5 mg total) by mouth daily. Continue all antihypertensives as prescribed.  Remember to bring in your blood pressure log with you for your follow up appointment.  DASH/Mediterranean Diets are healthier choices for HTN.   Patient has been counseled on age-appropriate routine health concerns for screening and prevention. These are reviewed and up-to-date. Referrals have been placed accordingly. Immunizations are up-to-date or declined.    Subjective:   Chief Complaint  Patient presents with  . Abdominal Pain    Pt. stated she been having abdominal pain and think its because a cyst in her cervical .    HPI Jessica Case 38 y.o. female presents to office today with complaints of abdominal pain.   Abdominal Pain Onset one week ago. LLQ abdominal . Pain described as "all of it" when I ask if the pain is sharp, stabbing, pressure. She is not sexually active (last sexual activity 2 months ago). Last menstrual cycle was "the beginning of the month. Stools are "Mushy" and occurring every day. She has a history of fibroids and left salpingectomy and myomectomy (2017).  CT of Abdomen and Pelvis 10-30-2017 IMPRESSION: 1. Mild to moderate motion degradation, especially superiorly. 2. Given this factor, no acute process in the abdomen or pelvis. 3. Uterine fibroid. 4.  Trace free  pelvic fluid is likely physiologic.   Review of Systems  Constitutional: Negative for fever, malaise/fatigue and weight loss.  HENT: Negative.  Negative for nosebleeds.   Eyes: Negative.  Negative for blurred vision, double vision and photophobia.  Respiratory: Negative.  Negative for cough and shortness of breath.   Cardiovascular: Negative.  Negative for chest pain, palpitations and leg swelling.  Gastrointestinal: Positive for abdominal pain. Negative for blood in stool, constipation, diarrhea, heartburn, melena, nausea and vomiting.  Musculoskeletal: Negative.  Negative for myalgias.  Neurological: Negative.  Negative for dizziness, focal weakness, seizures and headaches.  Psychiatric/Behavioral: Negative.  Negative for suicidal ideas.    Past Medical History:  Diagnosis Date  . Arthritis   . Claustrophobia   . Cold    currently - no meds  . Fallopian tube abscess   . Fibroids   . Paranoid schizophrenia ([place])   . SVD (spontaneous vaginal delivery)    x 1    Past Surgical History:  Procedure Laterality Date  . BUNIONECTOMY    . bunyonectomy Bilateral    x 1  . LAPAROSCOPIC UNILATERAL SALPINGECTOMY Left 08/18/2016   Procedure: LAPAROSCOPIC UNILATERAL SALPINGECTOMY;  Surgeon: Lavonia Drafts, MD;  Location: [facility];  Service: Gynecology;  Laterality: Left;  . LAPAROSCOPY  08/18/2016   Procedure: LAPAROSCOPY OPERATIVE;  Surgeon: Lavonia Drafts, MD;  Location: [facility];  Service: Gynecology;;  . LAPAROTOMY  08/18/2016   Procedure: LAPAROTOMY;  Surgeon: Lavonia Drafts, MD;  Location: [facility];  Service:  Gynecology;;  mini laparotomy  . MYOMECTOMY  08/18/2016   Procedure: MYOMECTOMY;  Surgeon: Lavonia Drafts, MD;  Location: [facility];  Service: Gynecology;;  . TOOTH EXTRACTION      Family History  Problem Relation Age of Onset  . Hypertension Mother     Social History Reviewed with no changes to be made today.   Outpatient Medications Prior to  Visit  Medication Sig Dispense Refill  . hydrOXYzine (VISTARIL) 50 MG capsule Take 50 mg by mouth every 6 (six) hours as needed for anxiety.     Marland Kitchen lurasidone (LATUDA) 40 MG TABS tablet Take 40 mg by mouth daily with breakfast.    . traZODone (DESYREL) 100 MG tablet Take 200 mg by mouth at bedtime.     Marland Kitchen amLODipine (NORVASC) 5 MG tablet Take 1 tablet (5 mg total) by mouth daily. 30 tablet 2  . omeprazole (PRILOSEC) 40 MG capsule Take 1 capsule (40 mg total) by mouth daily before supper. 90 capsule 1  . polyethylene glycol powder (GLYCOLAX/MIRALAX) powder Take 17 g by mouth 2 (two) times daily as needed. (Patient not taking: Reported on 11/16/2017) 3350 g 1   No facility-administered medications prior to visit.     Allergies  Allergen Reactions  . Citrus Hives and Itching  . Food Hives, Itching and Other (See Comments)    Pt confirmed that it is not an anaphylactic reaction  . Strawberry Flavor     Hives all over body.   . Toradol [Ketorolac Tromethamine] Hives and Itching       Objective:    BP (!) 146/92 (BP Location: Left Arm, Patient Position: Sitting, Cuff Size: Normal)   Pulse 79   Temp 98.4 F (36.9 C) (Oral)   Ht _0  (1.549 m)   Wt 113 lb 12.8 oz (51.6 kg)   SpO2 99%   BMI 21.50 kg/m  Wt Readings from Last 3 Encounters:  12/12/18 113 lb 12.8 oz (51.6 kg)  09/06/18 111 lb 9.6 oz (50.6 kg)  05/13/18 117 lb (53.1 kg)    Physical Exam Vitals signs and nursing note reviewed.  Constitutional:      Appearance: She is well-developed.  HENT:     Head: Normocephalic and atraumatic.  Neck:     Musculoskeletal: Normal range of motion.  Cardiovascular:     Rate and Rhythm: Normal rate and regular rhythm.     Heart sounds: Normal heart sounds. No murmur. No friction rub. No gallop.   Pulmonary:     Effort: Pulmonary effort is normal. No tachypnea or respiratory distress.     Breath sounds: Normal breath sounds. No decreased breath sounds, wheezing, rhonchi or rales.    Chest:     Chest wall: No tenderness.  Abdominal:     General: Abdomen is protuberant. Bowel sounds are normal.     Palpations: Abdomen is soft.     Tenderness: There is abdominal tenderness in the periumbilical area, suprapubic area and left lower quadrant. There is left CVA tenderness and rebound.  Musculoskeletal: Normal range of motion.  Skin:    General: Skin is warm and dry.  Neurological:     Mental Status: She is alert and oriented to person, place, and time.     Coordination: Coordination normal.  Psychiatric:        Behavior: Behavior normal. Behavior is cooperative.        Thought Content: Thought content normal.        Judgment: Judgment normal.  Patient has been counseled extensively about nutrition and exercise as well as the importance of adherence with medications and regular follow-up. The patient was given clear instructions to go to ER or return to medical center if symptoms don't improve, worsen or new problems develop. The patient verbalized understanding.   Follow-up: Return in about 3 months (around 03/12/2019) for HTN.   [name], FNP-BC [facility] and [employer] [place], [place]   12/12/2018, 2:04 PM

## 2018-12-13 LAB — CBC
HEMOGLOBIN: 11 g/dL — AB (ref 11.1–15.9)
Hematocrit: 32.6 % — ABNORMAL LOW (ref 34.0–46.6)
MCH: 33 pg (ref 26.6–33.0)
MCHC: 33.7 g/dL (ref 31.5–35.7)
MCV: 98 fL — ABNORMAL HIGH (ref 79–97)
Platelets: 299 10*3/uL (ref 150–450)
RBC: 3.33 x10E6/uL — ABNORMAL LOW (ref 3.77–5.28)
RDW: 13.6 % (ref 11.7–15.4)
WBC: 7 10*3/uL (ref 3.4–10.8)

## 2018-12-13 LAB — CMP14+EGFR
ALBUMIN: 4.1 g/dL (ref 3.8–4.8)
ALT: 10 IU/L (ref 0–32)
AST: 42 IU/L — ABNORMAL HIGH (ref 0–40)
Albumin/Globulin Ratio: 1.9 (ref 1.2–2.2)
Alkaline Phosphatase: 73 IU/L (ref 39–117)
BUN/Creatinine Ratio: 8 — ABNORMAL LOW (ref 9–23)
BUN: 6 mg/dL (ref 6–20)
Bilirubin Total: 0.5 mg/dL (ref 0.0–1.2)
CO2: 23 mmol/L (ref 20–29)
CREATININE: 0.76 mg/dL (ref 0.57–1.00)
Calcium: 9 mg/dL (ref 8.7–10.2)
Chloride: 104 mmol/L (ref 96–106)
GFR calc Af Amer: 115 mL/min/{1.73_m2} (ref >59)
GFR, EST NON AFRICAN AMERICAN: 100 mL/min/{1.73_m2} (ref >59)
GLUCOSE: 71 mg/dL (ref 65–99)
Globulin, Total: 2.2 g/dL (ref 1.5–4.5)
Potassium: 3.9 mmol/L (ref 3.5–5.2)
Sodium: 145 mmol/L — ABNORMAL HIGH (ref 134–144)
Total Protein: 6.3 g/dL (ref 6.0–8.5)

## 2018-12-14 LAB — URINE CYTOLOGY ANCILLARY ONLY
Candida vaginitis: NEGATIVE
Chlamydia: NEGATIVE
Neisseria Gonorrhea: NEGATIVE
TRICH (WINDOWPATH): NEGATIVE

## 2018-12-15 ENCOUNTER — Telehealth: Payer: Self-pay

## 2018-12-15 ENCOUNTER — Other Ambulatory Visit: Payer: Self-pay | Admitting: Nurse Practitioner

## 2018-12-15 MED ORDER — METRONIDAZOLE 500 MG PO TABS: 500.0000 mg | ORAL_TABLET | Freq: Two times a day (BID) | ORAL | 0 refills | Status: AC

## 2018-12-15 NOTE — Telephone Encounter (Signed)
-----   Message from [name], NP sent at 12/15/2018 12:04 AM EST ----- Urine positive for bacterial vaginosis. Prescription sent to pharmacy

## 2018-12-15 NOTE — Telephone Encounter (Signed)
CMA attempt to reach patient to inform on results.  No answer and left a VM.  

## 2018-12-16 LAB — CULTURE, URINE COMPREHENSIVE

## 2018-12-19 ENCOUNTER — Telehealth: Payer: Self-pay | Admitting: Nurse Practitioner

## 2018-12-19 NOTE — Telephone Encounter (Signed)
Called and lvm for her to call back.

## 2018-12-19 NOTE — Telephone Encounter (Signed)
Prior Auth Authorization number : [id number]

## 2018-12-19 NOTE — Telephone Encounter (Signed)
Per authorization needs to be done before the 4th of this month please call Kylie back at [phone]

## 2018-12-21 ENCOUNTER — Ambulatory Visit (HOSPITAL_COMMUNITY)
Admission: RE | Admit: 2018-12-21 | Discharge: 2018-12-21 | Disposition: A | Discharge status: Home / Self Care | Payer: Medicaid Other | Source: Ambulatory Visit | Attending: Nurse Practitioner | Admitting: Nurse Practitioner

## 2018-12-21 DIAGNOSIS — R109 Unspecified abdominal pain: Secondary | ICD-10-CM | POA: Diagnosis present

## 2018-12-21 NOTE — Telephone Encounter (Signed)
CMA spoke to patient to inform on results and Rx for pick up.  Pt. Verified DOB. Pt. Understood.

## 2018-12-22 NOTE — Telephone Encounter (Signed)
-----   Message from [name], NP sent at 12/21/2018  6:07 PM EST ----- US showing fibroids and left ovarian cyst. Will refer you to GYN for recommendations since you continue to have left sided tenderness

## 2018-12-22 NOTE — Telephone Encounter (Signed)
CMA spoke to patient to inform on ultrasound results.  Pt. Verified DOB. Pt. Understood.  Pt. Is aware gynecology referral will be placed.

## 2019-05-03 ENCOUNTER — Ambulatory Visit: Payer: Medicaid Other | Admitting: Nurse Practitioner

## 2019-05-15 ENCOUNTER — Emergency Department (HOSPITAL_COMMUNITY)
Admission: EM | Admit: 2019-05-15 | Discharge: 2019-05-15 | Disposition: A | Discharge status: Home / Self Care | Payer: Medicaid Other | Attending: Emergency Medicine | Admitting: Emergency Medicine

## 2019-05-15 ENCOUNTER — Emergency Department (HOSPITAL_COMMUNITY): Payer: Medicaid Other

## 2019-05-15 ENCOUNTER — Encounter (HOSPITAL_COMMUNITY): Payer: Self-pay | Admitting: Emergency Medicine

## 2019-05-15 DIAGNOSIS — Z79899 Other long term (current) drug therapy: Secondary | ICD-10-CM | POA: Diagnosis not present

## 2019-05-15 DIAGNOSIS — F17210 Nicotine dependence, cigarettes, uncomplicated: Secondary | ICD-10-CM | POA: Insufficient documentation

## 2019-05-15 DIAGNOSIS — Y929 Unspecified place or not applicable: Secondary | ICD-10-CM | POA: Diagnosis not present

## 2019-05-15 DIAGNOSIS — W108XXA Fall (on) (from) other stairs and steps, initial encounter: Secondary | ICD-10-CM | POA: Insufficient documentation

## 2019-05-15 DIAGNOSIS — S161XXA Strain of muscle, fascia and tendon at neck level, initial encounter: Secondary | ICD-10-CM | POA: Insufficient documentation

## 2019-05-15 DIAGNOSIS — Y999 Unspecified external cause status: Secondary | ICD-10-CM | POA: Insufficient documentation

## 2019-05-15 DIAGNOSIS — Y9301 Activity, walking, marching and hiking: Secondary | ICD-10-CM | POA: Diagnosis not present

## 2019-05-15 DIAGNOSIS — S199XXA Unspecified injury of neck, initial encounter: Secondary | ICD-10-CM | POA: Diagnosis present

## 2019-05-15 MED ORDER — HYDROCODONE-ACETAMINOPHEN 5-325 MG PO TABS: 1.0000 | ORAL_TABLET | Freq: Four times a day (QID) | ORAL | 0 refills | Status: DC | PRN

## 2019-05-15 MED ORDER — MORPHINE SULFATE (PF) 4 MG/ML IV SOLN
4.0000 mg | Freq: Once | INTRAVENOUS | Status: AC
  Administered 2019-05-15: 4 mg via INTRAMUSCULAR
  Filled 2019-05-15: qty 1

## 2019-05-15 MED ORDER — PREDNISONE 50 MG PO TABS: 50.0000 mg | ORAL_TABLET | Freq: Every day | ORAL | 0 refills | Status: DC

## 2019-05-15 MED ORDER — CYCLOBENZAPRINE HCL 10 MG PO TABS: 10.0000 mg | ORAL_TABLET | Freq: Three times a day (TID) | ORAL | 0 refills | Status: DC | PRN

## 2019-05-15 MED ORDER — OXYCODONE-ACETAMINOPHEN 5-325 MG PO TABS
1.0000 | ORAL_TABLET | Freq: Once | ORAL | Status: AC
  Administered 2019-05-15: 1 via ORAL
  Filled 2019-05-15: qty 1

## 2019-05-15 NOTE — ED Triage Notes (Signed)
Pt. Stated, I fell 2 days ago and hurt my left shoulder and my neck.

## 2019-05-15 NOTE — Discharge Instructions (Addendum)
Return here as needed.  Use ice and heat on your neck.  Your x-rays did not show any significant abnormalities.

## 2019-05-15 NOTE — ED Notes (Signed)
Pt given crackers and gingerale.

## 2019-05-15 NOTE — ED Notes (Signed)
Got patient a [place] patient is resting with call bell in reach

## 2019-05-19 NOTE — ED Provider Notes (Signed)
[facility] EMERGENCY DEPARTMENT Provider Note   CSN: [id number] Arrival date & time: 05/15/19  1003     History   Chief Complaint Chief Complaint  Patient presents with  . Shoulder Pain  . Neck Pain    HPI Jessica Case is a 38 y.o. female.     HPI Patient presents to the emergency department with left shoulder and neck discomfort after a fall.  Patient states she fell down a step landing on the railing and hitting the left shoulder.  The patient states that she is having pain in the left side of her neck as well.  Patient states she has no other injuries.  Patient denies any headache, blurred vision, weakness, numbness, dizziness, decrease strength or syncope. Past Medical History:  Diagnosis Date  . Arthritis   . Claustrophobia   . Cold    currently - no meds  . Fallopian tube abscess   . Fibroids   . Paranoid schizophrenia ([place])   . SVD (spontaneous vaginal delivery)    x 1    Patient Active Problem List   Diagnosis Date Noted  . Paranoid schizophrenia ([place]) 10/31/2017  . Right foot pain 05/11/2017  . Essential hypertension 10/08/2016  . Cocaine substance abuse ([place]) 10/08/2016  . Fibroids, subserous 08/18/2016  . Hydrosalpinx 04/13/2016  . Tobacco dependency 09/04/2015  . Migraine with status migrainosus 08/15/2015  . Allergic rhinitis 07/18/2015  . Pain due to dental caries 07/12/2014    Past Surgical History:  Procedure Laterality Date  . BUNIONECTOMY    . bunyonectomy Bilateral    x 1  . LAPAROSCOPIC UNILATERAL SALPINGECTOMY Left 08/18/2016   Procedure: LAPAROSCOPIC UNILATERAL SALPINGECTOMY;  Surgeon: Lavonia Drafts, MD;  Location: [facility];  Service: Gynecology;  Laterality: Left;  . LAPAROSCOPY  08/18/2016   Procedure: LAPAROSCOPY OPERATIVE;  Surgeon: Lavonia Drafts, MD;  Location: [facility];  Service: Gynecology;;  . LAPAROTOMY  08/18/2016   Procedure: LAPAROTOMY;  Surgeon: Lavonia Drafts, MD;  Location:  [facility];  Service: Gynecology;;  mini laparotomy  . MYOMECTOMY  08/18/2016   Procedure: MYOMECTOMY;  Surgeon: Lavonia Drafts, MD;  Location: [facility];  Service: Gynecology;;  . TOOTH EXTRACTION       OB History    Gravida  2   Para  1   Term  0   Preterm  1   AB  1   Living  0     SAB  1   TAB  0   Ectopic      Multiple      Live Births               Home Medications    Prior to Admission medications   Medication Sig Start Date End Date Taking? Authorizing Provider  amLODipine (NORVASC) 5 MG tablet Take 1 tablet (5 mg total) by mouth daily. 12/12/18 03/12/19  [name], NP  cyclobenzaprine (FLEXERIL) 10 MG tablet Take 1 tablet (10 mg total) by mouth 3 (three) times daily as needed for muscle spasms. 05/15/19   Tiffini Blacksher, Harrell Gave, PA-C  HYDROcodone-acetaminophen (NORCO/VICODIN) 5-325 MG tablet Take 1 tablet by mouth every 6 (six) hours as needed for moderate pain. 05/15/19   Karleen Seebeck, Harrell Gave, PA-C  hydrOXYzine (VISTARIL) 50 MG capsule Take 50 mg by mouth every 6 (six) hours as needed for anxiety.     [provider]  lurasidone (LATUDA) 40 MG TABS tablet Take 40 mg by mouth daily with breakfast.    [provider]  omeprazole (PRILOSEC) 40 MG capsule Take 1 capsule (40 mg total) by mouth daily before supper. 05/13/18 08/11/18  [name], NP  predniSONE (DELTASONE) 50 MG tablet Take 1 tablet (50 mg total) by mouth daily. 05/15/19   Tyrene Nader, Harrell Gave, PA-C  traZODone (DESYREL) 100 MG tablet Take 200 mg by mouth at bedtime.     [provider]    Family History Family History  Problem Relation Age of Onset  . Hypertension Mother     Social History Social History   Tobacco Use  . Smoking status: Current Every Day Smoker    Packs/day: 0.10    Years: 23.00    Pack years: 2.30    Types: Cigarettes  . Smokeless tobacco: Never Used  Substance Use Topics  . Alcohol use: Yes    Alcohol/week: 0.0 standard drinks     Comment: beer 40 oz /week  . Drug use: Yes    Types: Marijuana, "Crack" cocaine    Comment: reports she has not used crack in "a while", recent marijuana     Allergies   Citrus, Food, Strawberry flavor, and Toradol [ketorolac tromethamine]   Review of Systems Review of Systems  All other systems negative except as documented in the HPI. All pertinent positives and negatives as reviewed in the HPI. Physical Exam Updated Vital Signs BP (!) 151/78 (BP Location: Right Arm)   Pulse 81   Temp 97.9 F (36.6 C) (Oral)   Resp 16   LMP 05/03/2019   SpO2 98%   Physical Exam Vitals signs and nursing note reviewed.  Constitutional:      General: She is not in acute distress.    Appearance: She is well-developed.  HENT:     Head: Normocephalic and atraumatic.  Eyes:     Pupils: Pupils are equal, round, and reactive to light.  Neck:   Pulmonary:     Effort: Pulmonary effort is normal.  Skin:    General: Skin is warm and dry.  Neurological:     Mental Status: She is alert and oriented to person, place, and time.     Sensory: Sensation is intact.     Motor: Motor function is intact.     Coordination: Coordination is intact.     Gait: Gait is intact.     Deep Tendon Reflexes:     Reflex Scores:      Tricep reflexes are 2+ on the right side and 2+ on the left side.      Bicep reflexes are 2+ on the right side and 2+ on the left side.      Brachioradialis reflexes are 2+ on the right side and 2+ on the left side.     ED Treatments / Results  Labs (all labs ordered are listed, but only abnormal results are displayed) Labs Reviewed - No data to display  EKG None  Radiology No results found.  Procedures Procedures (including critical care time)  Medications Ordered in ED Medications  oxyCODONE-acetaminophen (PERCOCET/ROXICET) 5-325 MG per tablet 1 tablet (1 tablet Oral Given 05/15/19 1149)  morphine 4 MG/ML injection 4 mg (4 mg Intramuscular Given 05/15/19 1242)      Initial Impression / Assessment and Plan / ED Course  I have reviewed the triage vital signs and the nursing notes.  Pertinent labs & imaging results that were available during my care of the patient were reviewed by me and considered in my medical decision making (see chart for details).  Patient has no numbness or weakness in the upper extremities.  She has no decreased sensation.  She has normal reflexes as well.  Patient is advised to use ice and heat on the areas that are sore.  Told to return here as needed. Final Clinical Impressions(s) / ED Diagnoses   Final diagnoses:  Strain of neck muscle, initial encounter    ED Discharge Orders         Ordered    predniSONE (DELTASONE) 50 MG tablet  Daily     05/15/19 1316    cyclobenzaprine (FLEXERIL) 10 MG tablet  3 times daily PRN     05/15/19 1316    HYDROcodone-acetaminophen (NORCO/VICODIN) 5-325 MG tablet  Every 6 hours PRN     05/15/19 [address], PA-C 05/19/19 [place], Ankit, MD 05/24/19 [phone]

## 2019-05-30 ENCOUNTER — Ambulatory Visit: Payer: Medicaid Other | Attending: Nurse Practitioner | Admitting: Nurse Practitioner

## 2019-07-25 ENCOUNTER — Telehealth: Payer: Self-pay | Admitting: Family Medicine

## 2019-07-25 NOTE — Telephone Encounter (Signed)
Called the patient to inform of the upcoming appointment. Received a message your call can not be completed at this time. Please hang up and try your call again later, Thank you.

## 2019-07-26 ENCOUNTER — Telehealth: Payer: Self-pay | Admitting: Family Medicine

## 2019-07-26 ENCOUNTER — Encounter: Payer: Self-pay | Admitting: Obstetrics and Gynecology

## 2019-07-26 ENCOUNTER — Ambulatory Visit (INDEPENDENT_AMBULATORY_CARE_PROVIDER_SITE_OTHER): Payer: Medicaid Other | Admitting: Obstetrics and Gynecology

## 2019-07-26 ENCOUNTER — Other Ambulatory Visit: Payer: Self-pay

## 2019-07-26 VITALS — BP 150/102 | HR 85 | Wt 123.7 lb

## 2019-07-26 DIAGNOSIS — D259 Leiomyoma of uterus, unspecified: Secondary | ICD-10-CM

## 2019-07-26 LAB — POCT PREGNANCY, URINE: Preg Test, Ur: NEGATIVE

## 2019-07-26 MED ORDER — VITAFOL ULTRA 29-0.6-0.4-200 MG PO CAPS: 1.0000 | ORAL_CAPSULE | Freq: Every day | ORAL | 12 refills | Status: DC

## 2019-07-26 NOTE — Progress Notes (Signed)
38 yo G2P0110 with LMP 10/5 and BMI 23 presenting today for the evaluation of her fibroids. Patient had a myomectomy in 2018. She reports feeling well with a monthly period lasting 3 days. She states her last period was heavier with the passage of clots which made her worry about the return of her fibroids. Patient is also seeking pregnancy. Her partner is 59 years old, a heavy smoker and has previously fathered one child. She is an occasional cigar smoker and drinks regularly.  Past Medical History:  Diagnosis Date  . Arthritis   . Claustrophobia   . Cold    currently - no meds  . Fallopian tube abscess   . Fibroids   . Paranoid schizophrenia ([place])   . SVD (spontaneous vaginal delivery)    x 1   Past Surgical History:  Procedure Laterality Date  . BUNIONECTOMY    . bunyonectomy Bilateral    x 1  . LAPAROSCOPIC UNILATERAL SALPINGECTOMY Left 08/18/2016   Procedure: LAPAROSCOPIC UNILATERAL SALPINGECTOMY;  Surgeon: Lavonia Drafts, MD;  Location: [facility];  Service: Gynecology;  Laterality: Left;  . LAPAROSCOPY  08/18/2016   Procedure: LAPAROSCOPY OPERATIVE;  Surgeon: Lavonia Drafts, MD;  Location: [facility];  Service: Gynecology;;  . LAPAROTOMY  08/18/2016   Procedure: LAPAROTOMY;  Surgeon: Lavonia Drafts, MD;  Location: [facility];  Service: Gynecology;;  mini laparotomy  . MYOMECTOMY  08/18/2016   Procedure: MYOMECTOMY;  Surgeon: Lavonia Drafts, MD;  Location: [facility];  Service: Gynecology;;  . TOOTH EXTRACTION     Family History  Problem Relation Age of Onset  . Hypertension Mother    Social History   Tobacco Use  . Smoking status: Current Every Day Smoker    Packs/day: 0.10    Years: 23.00    Pack years: 2.30    Types: Cigarettes  . Smokeless tobacco: Never Used  Substance Use Topics  . Alcohol use: Yes    Alcohol/week: 0.0 standard drinks    Comment: beer 40 oz /week  . Drug use: Yes    Types: Marijuana, "Crack" cocaine    Comment: reports she  has not used crack in "a while", recent marijuana   ROS See pertinent in HPI  Blood pressure (!) 150/102, pulse 85, weight 123 lb 11.2 oz (56.1 kg), last menstrual period 07/24/2019.  GENERAL: Well-developed, well-nourished female in no acute distress.  LUNGS: Clear to auscultation bilaterally.  HEART: Regular rate and rhythm. ABDOMEN: Soft, nontender, nondistended. No organomegaly. PELVIC: Normal external female genitalia. Uterus is normal in size. No adnexal mass or tenderness. EXTREMITIES: No cyanosis, clubbing, or edema, 2+ distal pulses.  12/2018 ultrasound FINDINGS: Uterus  Measurements: 7.2 x 3.6 x 4.7 cm = volume: 64 mL. Exophytic leiomyoma at posterior lower uterine segment 4.6 x 2.7 x 3.0 cm, subserosal. Additional small intramural leiomyoma at upper uterine segment 8 x 5 x 8 mm.  Endometrium  Thickness: 7 mm.  No endometrial fluid or focal abnormality  Right ovary  Measurements: 3.2 x 2.1 x 2.2 cm = volume: 7.9 mL. Normal morphology without mass  Left ovary  Measurements: 2.9 x 1.9 x 1.9 cm = volume: 5.5 mL. Probable tiny hemorrhagic follicle/cyst measuring 15 x 11 x 10 mm. No other masses.  Other findings  No free pelvic fluid or adnexal masses  IMPRESSION: Uterine leiomyomata as above.  Probable tiny hemorrhagic LEFT ovarian cyst.  No other pelvic sonographic abnormalities.   Electronically Signed   By: Lavonia Dana M.D.   On: 12/21/2018 16:14  A/P 38 yo with small fibroid uterus and desire for contraception - Reviewed timing of intercourse based on menstrual cycle - Patient advised to return in January if conception did not occur for referral to infertility specialist. Discussed sperm analysis with partner PCP/urologist - Rx prenatal vitamins provided - Patient with hypertension. Patient is aware and was advised to follow up with PCP for further management. Patient encouraged to have BP well under control before conception

## 2019-07-26 NOTE — Telephone Encounter (Signed)
The patient stated she would like a call back to discuss uterine uterine leiomyoma.

## 2019-08-31 ENCOUNTER — Ambulatory Visit: Payer: Medicaid Other

## 2019-10-10 ENCOUNTER — Other Ambulatory Visit: Payer: Self-pay | Admitting: Family Medicine

## 2019-10-10 DIAGNOSIS — I10 Essential (primary) hypertension: Secondary | ICD-10-CM

## 2019-10-27 ENCOUNTER — Ambulatory Visit: Payer: Medicaid Other | Admitting: Podiatry

## 2020-01-22 ENCOUNTER — Ambulatory Visit: Payer: Medicaid Other | Attending: Nurse Practitioner

## 2020-01-22 ENCOUNTER — Other Ambulatory Visit: Payer: Self-pay

## 2020-01-22 DIAGNOSIS — Z111 Encounter for screening for respiratory tuberculosis: Secondary | ICD-10-CM | POA: Diagnosis not present

## 2020-01-22 NOTE — Progress Notes (Signed)
Pt. Is here for a skin TB placement.  Placed on patient's left forearm. Pt. Tolerated well.  Pt. Was informed to come back on Wednesday to have her TB skin test read.

## 2020-01-29 ENCOUNTER — Ambulatory Visit: Payer: Medicaid Other | Attending: Nurse Practitioner

## 2020-01-29 ENCOUNTER — Other Ambulatory Visit: Payer: Self-pay | Admitting: Nurse Practitioner

## 2020-01-29 ENCOUNTER — Other Ambulatory Visit: Payer: Self-pay

## 2020-01-29 VITALS — Temp 97.9°F

## 2020-01-29 DIAGNOSIS — Z111 Encounter for screening for respiratory tuberculosis: Secondary | ICD-10-CM | POA: Diagnosis not present

## 2020-01-29 MED ORDER — LORATADINE 10 MG PO TABS: 10.0000 mg | ORAL_TABLET | Freq: Every day | ORAL | 11 refills | Status: DC

## 2020-01-29 NOTE — Progress Notes (Signed)
Pt is here requesting  another PPD test due to missed  appt for scheduled test  reading/  Due to unavailable PPD test last week results and per CDC recomendations lab Quantiferon-TB gold was ordered today. Advised pt to f /u results within few days/ Prescription refills for Claritin  done today. Made pt aware.

## 2020-02-01 ENCOUNTER — Ambulatory Visit: Payer: Medicaid Other | Attending: Nurse Practitioner

## 2020-02-01 ENCOUNTER — Other Ambulatory Visit: Payer: Self-pay

## 2020-02-01 LAB — QUANTIFERON-TB GOLD PLUS
QuantiFERON Mitogen Value: >10 IU/mL
QuantiFERON Nil Value: 0.07 IU/mL
QuantiFERON TB1 Ag Value: 0.07 IU/mL
QuantiFERON TB2 Ag Value: 0.07 IU/mL
QuantiFERON-TB Gold Plus: NEGATIVE

## 2020-02-12 ENCOUNTER — Other Ambulatory Visit: Payer: Self-pay

## 2020-02-12 ENCOUNTER — Emergency Department (HOSPITAL_COMMUNITY)
Admission: EM | Admit: 2020-02-12 | Discharge: 2020-02-12 | Disposition: A | Discharge status: Home / Self Care | Payer: Medicaid Other | Attending: Emergency Medicine | Admitting: Emergency Medicine

## 2020-02-12 ENCOUNTER — Encounter (HOSPITAL_COMMUNITY): Payer: Self-pay

## 2020-02-12 DIAGNOSIS — R300 Dysuria: Secondary | ICD-10-CM | POA: Insufficient documentation

## 2020-02-12 DIAGNOSIS — Z5321 Procedure and treatment not carried out due to patient leaving prior to being seen by health care provider: Secondary | ICD-10-CM | POA: Diagnosis not present

## 2020-02-12 DIAGNOSIS — H9312 Tinnitus, left ear: Secondary | ICD-10-CM | POA: Diagnosis present

## 2020-02-12 DIAGNOSIS — R1110 Vomiting, unspecified: Secondary | ICD-10-CM | POA: Diagnosis not present

## 2020-02-12 MED ORDER — SODIUM CHLORIDE 0.9% FLUSH: 3.0000 mL | Freq: Once | INTRAVENOUS | Status: DC

## 2020-02-12 NOTE — ED Triage Notes (Signed)
Patient c/o Left ear beating" x 5 days.  Patient also c/o pressure when she urinates. Patient states her urine is dark brown.  Patient also c/o emesis x 20 times in the past 3 days.

## 2020-02-19 NOTE — Progress Notes (Signed)
Patient did not show for appointment.   

## 2020-02-22 ENCOUNTER — Ambulatory Visit (HOSPITAL_BASED_OUTPATIENT_CLINIC_OR_DEPARTMENT_OTHER): Payer: Medicaid Other | Admitting: Family

## 2020-02-22 DIAGNOSIS — Z5329 Procedure and treatment not carried out because of patient's decision for other reasons: Secondary | ICD-10-CM

## 2020-04-04 ENCOUNTER — Other Ambulatory Visit: Payer: Self-pay

## 2020-04-04 ENCOUNTER — Other Ambulatory Visit (HOSPITAL_COMMUNITY)
Admission: RE | Admit: 2020-04-04 | Discharge: 2020-04-04 | Disposition: A | Discharge status: Home / Self Care | Payer: Medicaid Other | Source: Ambulatory Visit | Attending: Nurse Practitioner | Admitting: Nurse Practitioner

## 2020-04-04 ENCOUNTER — Ambulatory Visit: Payer: Medicaid Other | Attending: Nurse Practitioner | Admitting: Physician Assistant

## 2020-04-04 VITALS — BP 136/88 | HR 101 | Temp 97.7°F | Ht 61.0 in | Wt 107.0 lb

## 2020-04-04 DIAGNOSIS — Z79899 Other long term (current) drug therapy: Secondary | ICD-10-CM | POA: Insufficient documentation

## 2020-04-04 DIAGNOSIS — N912 Amenorrhea, unspecified: Secondary | ICD-10-CM | POA: Diagnosis not present

## 2020-04-04 DIAGNOSIS — Z7952 Long term (current) use of systemic steroids: Secondary | ICD-10-CM | POA: Insufficient documentation

## 2020-04-04 DIAGNOSIS — M1990 Unspecified osteoarthritis, unspecified site: Secondary | ICD-10-CM | POA: Insufficient documentation

## 2020-04-04 DIAGNOSIS — R6889 Other general symptoms and signs: Secondary | ICD-10-CM

## 2020-04-04 DIAGNOSIS — R8290 Unspecified abnormal findings in urine: Secondary | ICD-10-CM | POA: Insufficient documentation

## 2020-04-04 DIAGNOSIS — H5789 Other specified disorders of eye and adnexa: Secondary | ICD-10-CM | POA: Insufficient documentation

## 2020-04-04 DIAGNOSIS — F200 Paranoid schizophrenia: Secondary | ICD-10-CM | POA: Insufficient documentation

## 2020-04-04 LAB — POCT URINALYSIS DIP (CLINITEK)
Bilirubin, UA: NEGATIVE
Blood, UA: NEGATIVE
Glucose, UA: NEGATIVE mg/dL
Ketones, POC UA: NEGATIVE mg/dL
Leukocytes, UA: NEGATIVE
Nitrite, UA: NEGATIVE
POC PROTEIN,UA: NEGATIVE
Spec Grav, UA: <=1.005 — AB (ref 1.010–1.025)
Urobilinogen, UA: 0.2 E.U./dL
pH, UA: 6.5 (ref 5.0–8.0)

## 2020-04-04 LAB — POCT URINE PREGNANCY: Preg Test, Ur: NEGATIVE

## 2020-04-04 MED ORDER — OLOPATADINE HCL 0.1 % OP SOLN: 1.0000 [drp] | Freq: Two times a day (BID) | OPHTHALMIC | 12 refills | Status: DC

## 2020-04-04 NOTE — Progress Notes (Signed)
Patient ID: Jessica Case, female   DOB: 04-02-1981, 39 y.o.   MRN: [id number]     Jessica Case, is a 39 y.o. female  WUJ:[id number]  [id number]  DOB - 1981/01/16  Subjective:  Chief Complaint and HPI: Jessica Case is a 39 y.o. female here today for Patient c/o 3 week h/o urine "smelling strong."  She denies dysuria or frequency.  No abdominal pain.  She says she doesn't know if the odor is coming from her vagina or urine and wants both tested.  Monogamous with partner.    Also 2 weeks late for period.  Not on [facility] and does not want to got on [facility].    Also c/o itchy eyes.  She already saw the eye doctor and they told her it was allergies.  She has not gotten the drops they prescribed.  Also needs glasses but has not had the money to get them.       ROS:   Constitutional:  No f/c, No night sweats, No unexplained weight loss. EENT:  No vision changes, No blurry vision, No hearing changes. No mouth, throat, or ear problems.  Respiratory: No cough, No SOB Cardiac: No CP, no palpitations GI:  No abd pain, No N/V/D. GU: No Urinary s/sx Musculoskeletal: No joint pain Neuro: No headache, no dizziness, no motor weakness.  Skin: No rash Endocrine:  No polydipsia. No polyuria.  Psych: Denies SI/HI  No problems updated.  ALLERGIES: Allergies  Allergen Reactions  . Citrus Hives and Itching  . Food Hives, Itching and Other (See Comments)    Pt confirmed that it is not an anaphylactic reaction  . Strawberry Flavor     Hives all over body.   . Toradol [Ketorolac Tromethamine] Hives and Itching    PAST MEDICAL HISTORY: Past Medical History:  Diagnosis Date  . Arthritis   . Claustrophobia   . Cold    currently - no meds  . Fallopian tube abscess   . Fibroids   . Paranoid schizophrenia ([place])   . SVD (spontaneous vaginal delivery)    x 1    MEDICATIONS AT HOME: Prior to Admission medications   Medication Sig Start Date End Date Taking? Authorizing Provider    cyclobenzaprine (FLEXERIL) 10 MG tablet Take 1 tablet (10 mg total) by mouth 3 (three) times daily as needed for muscle spasms. 05/15/19  Yes Lawyer, Harrell Gave, PA-C  hydrOXYzine (VISTARIL) 50 MG capsule Take 50 mg by mouth every 6 (six) hours as needed for anxiety.    Yes [provider]  loratadine (CLARITIN) 10 MG tablet Take 1 tablet (10 mg total) by mouth daily. 01/29/20  Yes [name], NP  predniSONE (DELTASONE) 50 MG tablet Take 1 tablet (50 mg total) by mouth daily. 05/15/19  Yes Lawyer, Harrell Gave, PA-C  Prenat-Fe Poly-Methfol-FA-DHA (VITAFOL ULTRA) 29-0.6-0.4-200 MG CAPS Take 1 tablet by mouth daily. 07/26/19  Yes Constant, Peggy, MD  traZODone (DESYREL) 100 MG tablet Take 200 mg by mouth at bedtime.    Yes [provider]  amLODipine (NORVASC) 5 MG tablet Take 1 tablet (5 mg total) by mouth daily. 12/12/18 03/12/19  [name], NP  HYDROcodone-acetaminophen (NORCO/VICODIN) 5-325 MG tablet Take 1 tablet by mouth every 6 (six) hours as needed for moderate pain. Patient not taking: Reported on 04/04/2020 05/15/19   Dalia Heading, PA-C  lurasidone (LATUDA) 40 MG TABS tablet Take 40 mg by mouth daily with breakfast. Patient not taking: Reported on 04/04/2020    [provider]  olopatadine (PATANOL) 0.1 % ophthalmic solution Place 1 drop into both eyes 2 (two) times daily. For itchy eyes 04/04/20   Argentina Donovan, PA-C     Objective:  EXAM:   Vitals:   04/04/20 1427  BP: 136/88  Pulse: (!) 101  Temp: 97.7 F (36.5 C)  TempSrc: Temporal  SpO2: 98%  Weight: 107 lb (48.5 kg)  Height: 5\' 1"  (1.549 m)    General appearance : A&OX3. NAD. Non-toxic-appearing HEENT: Atraumatic and Normocephalic.  PERRLA. EOM intact.  No erythema of conjunctiva currently.  No chemosis Neck: supple, no JVD. No cervical lymphadenopathy. No thyromegaly Chest/Lungs:  Breathing-non-labored, Good air entry bilaterally, breath sounds normal without rales, rhonchi, or  wheezing  CVS: S1 S2 regular, no murmurs, gallops, rubs . Extremities: Bilateral Lower Ext shows no edema, both legs are warm to touch with = pulse throughout Neurology:  CN II-XII grossly intact, Non focal.   Psych:  TP not linear always. J/I fair. Normal speech. Diverts  eye contact at times and blunted affect.  Skin:  No Rash  Data Review Lab Results  Component Value Date   HGBA1C 5.0 11/15/2014     Assessment & Plan   1. Abnormal urine odor Increase water intake.  No sign of infection - POCT URINALYSIS DIP (CLINITEK) - Cervicovaginal ancillary only  2. Amenorrhea Pregnancy is negative - POCT urine pregnancy  3. Itchy eyes - olopatadine (PATANOL) 0.1 % ophthalmic solution; Place 1 drop into both eyes 2 (two) times daily. For itchy eyes  Dispense: 5 mL; Refill: 12     Patient have been counseled extensively about nutrition and exercise  Return in about 3 months (around 07/05/2020) for pcp;  chronic conditions.  The patient was given clear instructions to go to ER or return to medical center if symptoms don't improve, worsen or new problems develop. The patient verbalized understanding. The patient was told to call to get lab results if they haven't heard anything in the next week.     Freeman Caldron, PA-C [facility] and [facility] [place], [place]   04/04/2020, 3:16 PM

## 2020-04-04 NOTE — Patient Instructions (Signed)
Drink 80-100 ounces of water daily   Dehydration, Adult Dehydration is condition in which there is not enough water or other fluids in the body. This happens when a person loses more fluids than he or she takes in. Important body parts cannot work right without the right amount of fluids. Any loss of fluids from the body can cause dehydration. Dehydration can be mild, worse, or very bad. It should be treated right away to keep it from getting very bad. What are the causes? This condition may be caused by:  Conditions that cause loss of water or other fluids, such as: ? Watery poop (diarrhea). ? Vomiting. ? Sweating a lot. ? Peeing (urinating) a lot.  Not drinking enough fluids, especially when you: ? Are ill. ? Are doing things that take a lot of energy to do.  Other illnesses and conditions, such as fever or infection.  Certain medicines, such as medicines that take extra fluid out of the body (diuretics).  Lack of safe drinking water.  Not being able to get enough water and food. What increases the risk? The following factors may make you more likely to develop this condition:  Having a long-term (chronic) illness that has not been treated the right way, such as: ? Diabetes. ? Heart disease. ? Kidney disease.  Being 80 years of age or older.  Having a disability.  Living in a place that is high above the ground or sea (high in altitude). The thinner, dried air causes more fluid loss.  Doing exercises that put stress on your body for a long time. What are the signs or symptoms? Symptoms of dehydration depend on how bad it is. Mild or worse dehydration  Thirst.  Dry lips or dry mouth.  Feeling dizzy or light-headed, especially when you stand up from sitting.  Muscle cramps.  Your body making: ? Dark pee (urine). Pee may be the color of tea. ? Less pee than normal. ? Less tears than normal.  Headache. Very bad dehydration  Changes in skin. Skin may: ? Be  cold to the touch (clammy). ? Be blotchy or pale. ? Not go back to normal right after you lightly pinch it and let it go.  Little or no tears, pee, or sweat.  Changes in vital signs, such as: ? Fast breathing. ? Low blood pressure. ? Weak pulse. ? Pulse that is more than 100 beats a minute when you are sitting still.  Other changes, such as: ? Feeling very thirsty. ? Eyes that look hollow (sunken). ? Cold hands and feet. ? Being mixed up (confused). ? Being very tired (lethargic) or having trouble waking from sleep. ? Short-term weight loss. ? Loss of consciousness. How is this treated? Treatment for this condition depends on how bad it is. Treatment should start right away. Do not wait until your condition gets very bad. Very bad dehydration is an emergency. You will need to go to a hospital.  Mild or worse dehydration can be treated at home. You may be asked to: ? Drink more fluids. ? Drink an oral rehydration solution (ORS). This drink helps get the right amounts of fluids and salts and minerals in the blood (electrolytes).  Very bad dehydration can be treated: ? With fluids through an IV tube. ? By getting normal levels of salts and minerals in your blood. This is often done by giving salts and minerals through a tube. The tube is passed through your nose and into your stomach. ? By  treating the root cause. Follow these instructions at home: Oral rehydration solution If told by your doctor, drink an ORS:  Make an ORS. Use instructions on the package.  Start by drinking small amounts, about  cup (120 mL) every 5-10 minutes.  Slowly drink more until you have had the amount that your doctor said to have. Eating and drinking         Drink enough clear fluid to keep your pee pale yellow. If you were told to drink an ORS, finish the ORS first. Then, start slowly drinking other clear fluids. Drink fluids such as: ? Water. Do not drink only water. Doing that can make the  salt (sodium) level in your body get too low. ? Water from ice chips you suck on. ? Fruit juice that you have added water to (diluted). ? Low-calorie sports drinks.  Eat foods that have the right amounts of salts and minerals, such as: ? Bananas. ? Oranges. ? Potatoes. ? Tomatoes. ? Spinach.  Do not drink alcohol.  Avoid: ? Drinks that have a lot of sugar. These include:  High-calorie sports drinks.  Fruit juice that you did not add water to.  Soda.  Caffeine. ? Foods that are greasy or have a lot of fat or sugar. General instructions  Take over-the-counter and prescription medicines only as told by your doctor.  Do not take salt tablets. Doing that can make the salt level in your body get too high.  Return to your normal activities as told by your doctor. Ask your doctor what activities are safe for you.  Keep all follow-up visits as told by your doctor. This is important. Contact a doctor if:  You have pain in your belly (abdomen) and the pain: ? Gets worse. ? Stays in one place.  You have a rash.  You have a stiff neck.  You get angry or annoyed (irritable) more easily than normal.  You are more tired or have a harder time waking than normal.  You feel: ? Weak or dizzy. ? Very thirsty. Get help right away if you have:  Any symptoms of very bad dehydration.  Symptoms of vomiting, such as: ? You cannot eat or drink without vomiting. ? Your vomiting gets worse or does not go away. ? Your vomit has blood or green stuff in it.  Symptoms that get worse with treatment.  A fever.  A very bad headache.  Problems with peeing or pooping (having a bowel movement), such as: ? Watery poop that gets worse or does not go away. ? Blood in your poop (stool). This may cause poop to look black and tarry. ? Not peeing in 6-8 hours. ? Peeing only a small amount of very dark pee in 6-8 hours.  Trouble breathing. These symptoms may be an emergency. Do not wait to  see if the symptoms will go away. Get medical help right away. Call your local emergency services (911 in the [place] Do not drive yourself to the hospital. Summary  Dehydration is a condition in which there is not enough water or other fluids in the body. This happens when a person loses more fluids than he or she takes in.  Treatment for this condition depends on how bad it is. Treatment should be started right away. Do not wait until your condition gets very bad.  Drink enough clear fluid to keep your pee pale yellow. If you were told to drink an oral rehydration solution (ORS), finish the ORS first.  Then, start slowly drinking other clear fluids.  Take over-the-counter and prescription medicines only as told by your doctor.  Get help right away if you have any symptoms of very bad dehydration. This information is not intended to replace advice given to you by your health care provider. Make sure you discuss any questions you have with your health care provider. Document Revised: 05/18/2019 Document Reviewed: 05/18/2019 Elsevier Patient Education  [employer].

## 2020-04-15 ENCOUNTER — Other Ambulatory Visit: Payer: Self-pay

## 2020-04-15 ENCOUNTER — Encounter (HOSPITAL_COMMUNITY): Payer: Self-pay | Admitting: Emergency Medicine

## 2020-04-15 ENCOUNTER — Emergency Department (HOSPITAL_COMMUNITY): Payer: Medicaid Other

## 2020-04-15 ENCOUNTER — Observation Stay (HOSPITAL_COMMUNITY)
Admission: EM | Admit: 2020-04-15 | Discharge: 2020-04-16 | Disposition: A | Discharge status: Home / Self Care | Payer: Medicaid Other | Attending: Internal Medicine | Admitting: Internal Medicine

## 2020-04-15 DIAGNOSIS — J45901 Unspecified asthma with (acute) exacerbation: Principal | ICD-10-CM | POA: Insufficient documentation

## 2020-04-15 DIAGNOSIS — J45909 Unspecified asthma, uncomplicated: Secondary | ICD-10-CM | POA: Diagnosis present

## 2020-04-15 DIAGNOSIS — J309 Allergic rhinitis, unspecified: Secondary | ICD-10-CM | POA: Insufficient documentation

## 2020-04-15 DIAGNOSIS — F17210 Nicotine dependence, cigarettes, uncomplicated: Secondary | ICD-10-CM | POA: Insufficient documentation

## 2020-04-15 DIAGNOSIS — R9431 Abnormal electrocardiogram [ECG] [EKG]: Secondary | ICD-10-CM | POA: Diagnosis not present

## 2020-04-15 DIAGNOSIS — Z7952 Long term (current) use of systemic steroids: Secondary | ICD-10-CM | POA: Diagnosis not present

## 2020-04-15 DIAGNOSIS — F200 Paranoid schizophrenia: Secondary | ICD-10-CM | POA: Diagnosis not present

## 2020-04-15 DIAGNOSIS — I10 Essential (primary) hypertension: Secondary | ICD-10-CM | POA: Diagnosis not present

## 2020-04-15 DIAGNOSIS — Z20822 Contact with and (suspected) exposure to covid-19: Secondary | ICD-10-CM | POA: Diagnosis not present

## 2020-04-15 DIAGNOSIS — Z79899 Other long term (current) drug therapy: Secondary | ICD-10-CM | POA: Diagnosis not present

## 2020-04-15 DIAGNOSIS — E876 Hypokalemia: Secondary | ICD-10-CM | POA: Diagnosis not present

## 2020-04-15 LAB — SARS CORONAVIRUS 2 BY RT PCR (HOSPITAL ORDER, PERFORMED IN ~~LOC~~ HOSPITAL LAB): SARS Coronavirus 2: NEGATIVE

## 2020-04-15 LAB — CBC WITH DIFFERENTIAL/PLATELET
Abs Immature Granulocytes: 0.01 10*3/uL (ref 0.00–0.07)
Basophils Absolute: 0 10*3/uL (ref 0.0–0.1)
Basophils Relative: 1 %
Eosinophils Absolute: 0 10*3/uL (ref 0.0–0.5)
Eosinophils Relative: 0 %
HCT: 31.3 % — ABNORMAL LOW (ref 36.0–46.0)
Hemoglobin: 10.8 g/dL — ABNORMAL LOW (ref 12.0–15.0)
Immature Granulocytes: 0 %
Lymphocytes Relative: 30 %
Lymphs Abs: 1.1 10*3/uL (ref 0.7–4.0)
MCH: 33.1 pg (ref 26.0–34.0)
MCHC: 34.5 g/dL (ref 30.0–36.0)
MCV: 96 fL (ref 80.0–100.0)
Monocytes Absolute: 0.4 10*3/uL (ref 0.1–1.0)
Monocytes Relative: 12 %
Neutro Abs: 2 10*3/uL (ref 1.7–7.7)
Neutrophils Relative %: 57 %
Platelets: 201 10*3/uL (ref 150–400)
RBC: 3.26 MIL/uL — ABNORMAL LOW (ref 3.87–5.11)
RDW: 13.8 % (ref 11.5–15.5)
WBC: 3.5 10*3/uL — ABNORMAL LOW (ref 4.0–10.5)
nRBC: 0 % (ref 0.0–0.2)

## 2020-04-15 LAB — MAGNESIUM: Magnesium: 2 mg/dL (ref 1.7–2.4)

## 2020-04-15 LAB — BLOOD GAS, VENOUS
Acid-base deficit: 0 mmol/L (ref 0.0–2.0)
Bicarbonate: 23.3 mmol/L (ref 20.0–28.0)
O2 Saturation: 72.8 %
Patient temperature: 98.6
pCO2, Ven: 34.6 mmHg — ABNORMAL LOW (ref 44.0–60.0)
pH, Ven: 7.442 — ABNORMAL HIGH (ref 7.250–7.430)
pO2, Ven: 46.2 mmHg — ABNORMAL HIGH (ref 32.0–45.0)

## 2020-04-15 LAB — BASIC METABOLIC PANEL
Anion gap: 15 (ref 5–15)
BUN: <5 mg/dL — ABNORMAL LOW (ref 6–20)
CO2: 24 mmol/L (ref 22–32)
Calcium: 7.7 mg/dL — ABNORMAL LOW (ref 8.9–10.3)
Chloride: 104 mmol/L (ref 98–111)
Creatinine, Ser: 0.73 mg/dL (ref 0.44–1.00)
GFR calc Af Amer: >60 mL/min (ref >60)
GFR calc non Af Amer: >60 mL/min (ref >60)
Glucose, Bld: 151 mg/dL — ABNORMAL HIGH (ref 70–99)
Potassium: 2.1 mmol/L — CL (ref 3.5–5.1)
Sodium: 143 mmol/L (ref 135–145)

## 2020-04-15 LAB — I-STAT BETA HCG BLOOD, ED (MC, WL, AP ONLY): I-stat hCG, quantitative: <5 m[IU]/mL (ref <5)

## 2020-04-15 LAB — TROPONIN I (HIGH SENSITIVITY): Troponin I (High Sensitivity): 6 ng/L (ref <18)

## 2020-04-15 LAB — D-DIMER, QUANTITATIVE: D-Dimer, Quant: 0.28 ug/mL-FEU (ref 0.00–0.50)

## 2020-04-15 MED ORDER — POTASSIUM CHLORIDE 10 MEQ/100ML IV SOLN
10.0000 meq | INTRAVENOUS | Status: AC
  Administered 2020-04-15 – 2020-04-16 (×5): 10 meq via INTRAVENOUS
  Filled 2020-04-15 (×5): qty 100

## 2020-04-15 MED ORDER — SODIUM CHLORIDE 0.9 % IV BOLUS
1000.0000 mL | Freq: Once | INTRAVENOUS | Status: AC
  Administered 2020-04-15: 1000 mL via INTRAVENOUS

## 2020-04-15 MED ORDER — POTASSIUM CHLORIDE 10 MEQ/100ML IV SOLN: 10.0000 meq | Freq: Once | INTRAVENOUS | Status: DC

## 2020-04-15 MED ORDER — POTASSIUM CHLORIDE CRYS ER 20 MEQ PO TBCR
40.0000 meq | EXTENDED_RELEASE_TABLET | Freq: Once | ORAL | Status: AC
  Administered 2020-04-15: 40 meq via ORAL
  Filled 2020-04-15: qty 2

## 2020-04-15 MED ORDER — LORAZEPAM 2 MG/ML IJ SOLN
1.0000 mg | Freq: Once | INTRAMUSCULAR | Status: AC
  Administered 2020-04-15: 1 mg via INTRAVENOUS
  Filled 2020-04-15: qty 1

## 2020-04-15 NOTE — ED Provider Notes (Signed)
[facility] DEPT Provider Note   CSN: [id number] Arrival date & time: 04/15/20  2006     History Chief Complaint  Patient presents with  . Shortness of Breath    Jessica Case is a 39 y.o. female.  Jessica Case is a 40 y.o. female with a history of schizophrenia, substance abuse, uterine fibroids, migraines, who presents to the emergency department via EMS for evaluation of shortness of breath and wheezing.  Patient reports a history of asthma, but no prior history noted in the chart.  She states over the past 2 days she started to have some chest tightness and worsening shortness of breath after being around some people smoking cigarettes.  Reports associated nonproductive cough.  She states that today her breathing significantly worsened and was not improving with inhaler.  When EMS arrived patient was in some respiratory distress, they placed patient on 15 L O2 and gave her 15 mg of albuterol, 1 mg of Atrovent, 125 mg of Solu-Medrol, and 2 mg of magnesium.  By the time patient arrives here in the emergency department her breathing has significantly improved and she is no longer requiring oxygen.  But she reports she still breathing quickly and feels like she is having trouble catching her breath.  She states that she has had some sharp stabbing chest pains.  Reports it has been probably 3 or 4 years since she last had a problem with her asthma.  Patient reports that she currently smokes cigarettes, denies other recent substance abuse.  Denies lower extremity pain or swelling.  Denies abdominal pain, no fevers, or chills.        Past Medical History:  Diagnosis Date  . Arthritis   . Claustrophobia   . Cold    currently - no meds  . Fallopian tube abscess   . Fibroids   . Paranoid schizophrenia ([place])   . SVD (spontaneous vaginal delivery)    x 1    Patient Active Problem List   Diagnosis Date Noted  . Paranoid schizophrenia ([place])  10/31/2017  . Right foot pain 05/11/2017  . Essential hypertension 10/08/2016  . Cocaine substance abuse ([place]) 10/08/2016  . Fibroids, subserous 08/18/2016  . Hydrosalpinx 04/13/2016  . Tobacco dependency 09/04/2015  . Migraine with status migrainosus 08/15/2015  . Allergic rhinitis 07/18/2015  . Pain due to dental caries 07/12/2014    Past Surgical History:  Procedure Laterality Date  . BUNIONECTOMY    . bunyonectomy Bilateral    x 1  . LAPAROSCOPIC UNILATERAL SALPINGECTOMY Left 08/18/2016   Procedure: LAPAROSCOPIC UNILATERAL SALPINGECTOMY;  Surgeon: Lavonia Drafts, MD;  Location: [facility];  Service: Gynecology;  Laterality: Left;  . LAPAROSCOPY  08/18/2016   Procedure: LAPAROSCOPY OPERATIVE;  Surgeon: Lavonia Drafts, MD;  Location: [facility];  Service: Gynecology;;  . LAPAROTOMY  08/18/2016   Procedure: LAPAROTOMY;  Surgeon: Lavonia Drafts, MD;  Location: [facility];  Service: Gynecology;;  mini laparotomy  . MYOMECTOMY  08/18/2016   Procedure: MYOMECTOMY;  Surgeon: Lavonia Drafts, MD;  Location: [facility];  Service: Gynecology;;  . TOOTH EXTRACTION       OB History    Gravida  2   Para  1   Term  0   Preterm  1   AB  1   Living  0     SAB  1   TAB  0   Ectopic      Multiple      Live Births  Family History  Problem Relation Age of Onset  . Hypertension Mother     Social History   Tobacco Use  . Smoking status: Current Every Day Smoker    Packs/day: 0.10    Years: 23.00    Pack years: 2.30    Types: Cigarettes  . Smokeless tobacco: Never Used  Vaping Use  . Vaping Use: Never used  Substance Use Topics  . Alcohol use: Yes    Alcohol/week: 0.0 standard drinks    Comment: beer 40 oz /week  . Drug use: Not Currently    Types: Marijuana, "Crack" cocaine    Home Medications Prior to Admission medications   Medication Sig Start Date End Date Taking? Authorizing Provider  amLODipine (NORVASC) 5 MG tablet  Take 1 tablet (5 mg total) by mouth daily. 12/12/18 03/12/19  [name], NP  cyclobenzaprine (FLEXERIL) 10 MG tablet Take 1 tablet (10 mg total) by mouth 3 (three) times daily as needed for muscle spasms. 05/15/19   Lawyer, Harrell Gave, PA-C  HYDROcodone-acetaminophen (NORCO/VICODIN) 5-325 MG tablet Take 1 tablet by mouth every 6 (six) hours as needed for moderate pain. Patient not taking: Reported on 04/04/2020 05/15/19   Dalia Heading, PA-C  hydrOXYzine (VISTARIL) 50 MG capsule Take 50 mg by mouth every 6 (six) hours as needed for anxiety.     [provider]  loratadine (CLARITIN) 10 MG tablet Take 1 tablet (10 mg total) by mouth daily. 01/29/20   [name], NP  lurasidone (LATUDA) 40 MG TABS tablet Take 40 mg by mouth daily with breakfast. Patient not taking: Reported on 04/04/2020    [provider]  olopatadine (PATANOL) 0.1 % ophthalmic solution Place 1 drop into both eyes 2 (two) times daily. For itchy eyes 04/04/20   Argentina Donovan, PA-C  predniSONE (DELTASONE) 50 MG tablet Take 1 tablet (50 mg total) by mouth daily. 05/15/19   Lawyer, Harrell Gave, PA-C  Prenat-Fe Poly-Methfol-FA-DHA (VITAFOL ULTRA) 29-0.6-0.4-200 MG CAPS Take 1 tablet by mouth daily. 07/26/19   Constant, Peggy, MD  traZODone (DESYREL) 100 MG tablet Take 200 mg by mouth at bedtime.     [provider]    Allergies    Citrus, Food, Strawberry flavor, and Toradol [ketorolac tromethamine]  Review of Systems   Review of Systems  Constitutional: Negative for chills and fever.  HENT: Negative.   Respiratory: Positive for cough, chest tightness, shortness of breath and wheezing.   Cardiovascular: Positive for chest pain. Negative for palpitations and leg swelling.  Gastrointestinal: Negative for abdominal pain, nausea and vomiting.  Genitourinary: Negative for dysuria and frequency.  Musculoskeletal: Negative for arthralgias and myalgias.  Skin: Negative for color change and rash.    Neurological: Negative for dizziness, syncope and light-headedness.  Psychiatric/Behavioral: The patient is nervous/anxious.   All other systems reviewed and are negative.   Physical Exam Updated Vital Signs BP 122/83   Pulse (!) 110   Temp 97.8 F (36.6 C) (Oral)   Resp (!) 26   Ht 5\' 1"  (1.549 m)   Wt 48 kg   SpO2 100%   BMI 19.99 kg/m   Physical Exam Vitals and nursing note reviewed.  Constitutional:      General: She is in acute distress.     Appearance: She is well-developed. She is not diaphoretic.     Comments: Patient arrives anxious and in some distress, although satting 100% room air  HENT:     Head: Normocephalic and atraumatic.     Mouth/Throat:  Mouth: Mucous membranes are moist.     Pharynx: Oropharynx is clear.  Eyes:     General:        Right eye: No discharge.        Left eye: No discharge.     Pupils: Pupils are equal, round, and reactive to light.  Cardiovascular:     Rate and Rhythm: Regular rhythm. Tachycardia present.     Heart sounds: Normal heart sounds.  Pulmonary:     Effort: Tachypnea present. No respiratory distress.     Breath sounds: Normal breath sounds. No wheezing or rales.     Comments: Patient is tachypneic with increased respiratory effort but is satting at 100% on room air, lungs clear to auscultation throughout without wheezing, rales or rhonchi normal, patient is able to speak in short sentences. Chest:     Chest wall: No tenderness.  Abdominal:     General: Bowel sounds are normal. There is no distension.     Palpations: Abdomen is soft. There is no mass.     Tenderness: There is no abdominal tenderness. There is no guarding.     Comments: Abdomen soft, nondistended, nontender to palpation in all quadrants without guarding or peritoneal signs  Musculoskeletal:        General: No deformity.     Cervical back: Neck supple.     Right lower leg: No tenderness. No edema.     Left lower leg: No tenderness. No edema.  Skin:     General: Skin is warm and dry.     Capillary Refill: Capillary refill takes less than 2 seconds.  Neurological:     Mental Status: She is alert.     Coordination: Coordination normal.     Comments: Speech is clear, able to follow commands Moves extremities without ataxia, coordination intact  Psychiatric:        Mood and Affect: Mood is anxious.     ED Results / Procedures / Treatments   Labs (all labs ordered are listed, but only abnormal results are displayed) Labs Reviewed  BASIC METABOLIC PANEL - Abnormal; Notable for the following components:      Result Value   Potassium 2.1 (*)    Glucose, Bld 151 (*)    BUN <5 (*)    Calcium 7.7 (*)    All other components within normal limits  CBC WITH DIFFERENTIAL/PLATELET - Abnormal; Notable for the following components:   WBC 3.5 (*)    RBC 3.26 (*)    Hemoglobin 10.8 (*)    HCT 31.3 (*)    All other components within normal limits  BLOOD GAS, VENOUS - Abnormal; Notable for the following components:   pH, Ven 7.442 (*)    pCO2, Ven 34.6 (*)    pO2, Ven 46.2 (*)    All other components within normal limits  SARS CORONAVIRUS 2 BY RT PCR ([facility])  D-DIMER, QUANTITATIVE (NOT AT [facility])  RAPID URINE DRUG SCREEN, HOSP PERFORMED  MAGNESIUM  I-STAT BETA HCG BLOOD, ED ([facility], [facility], [facility] ONLY)  TROPONIN I (HIGH SENSITIVITY)    EKG EKG Interpretation  Date/Time:  Monday April 15 2020 20:37:22 EDT Ventricular Rate:  97 PR Interval:    QRS Duration: 95 QT Interval:  447 QTC Calculation: 568 R Axis:   77 Text Interpretation: Sinus or ectopic atrial rhythm Short PR interval Nonspecific repol abnormality, diffuse leads Prolonged QT interval QT prolonged since previous Confirmed by Wandra Arthurs [phone])  on 04/15/2020 8:40:37 PM   Radiology DG Chest Port 1 View  Result Date: 04/15/2020 CLINICAL DATA:  Asthma, shortness of breath EXAM: PORTABLE CHEST 1 VIEW COMPARISON:  None. FINDINGS: The heart  size and mediastinal contours are within normal limits. Both lungs are clear. The visualized skeletal structures are unremarkable. IMPRESSION: Normal study. Electronically Signed   By: Rolm Baptise M.D.   On: 04/15/2020 20:48    Procedures .Critical Care Performed by: Jacqlyn Larsen, PA-C Authorized by: Jacqlyn Larsen, PA-C   Critical care provider statement:    Critical care time (minutes):  45   Critical care was necessary to treat or prevent imminent or life-threatening deterioration of the following conditions:  Metabolic crisis (Hypokalemia)   Critical care was time spent personally by me on the following activities:  Discussions with consultants, evaluation of patient's response to treatment, examination of patient, ordering and performing treatments and interventions, ordering and review of laboratory studies, ordering and review of radiographic studies, pulse oximetry, re-evaluation of patient's condition, obtaining history from patient or surrogate and review of old charts   (including critical care time)  Medications Ordered in ED Medications  sodium chloride 0.9 % bolus 1,000 mL (has no administration in time range)  potassium chloride 10 mEq in 100 mL IVPB (has no administration in time range)  potassium chloride SA (KLOR-CON) CR tablet 40 mEq (has no administration in time range)  LORazepam (ATIVAN) injection 1 mg (1 mg Intravenous Given 04/15/20 2053)    ED Course  I have reviewed the triage vital signs and the nursing notes.  Pertinent labs & imaging results that were available during my care of the patient were reviewed by me and considered in my medical decision making (see chart for details).    MDM Rules/Calculators/A&P                           39 year old female arrives via EMS with reported shortness of breath and wheezing, patient reports history of asthma and feels this is an asthma exacerbation.  She was given 15 mg of albuterol as well as Atrovent, steroids  and magnesium with EMS, and is now no longer requiring oxygen but is still tachypneic and tachycardic with some increased respiratory effort.  Patient appears anxious and very jittery.  Her lungs are clear with good air movement.  Patient was able to calm down and slow her breathing and then was able to speak in full sentences.  Given respiratory distress with EMS will get labs, EKG, chest x-ray, also get troponin and D-dimer given patient does not have prior documented history of asthma in her chart to look for other potential etiologies.  Will give dose of Ativan to help calm patient is now her lungs are clear with good air movement.  I have independently ordered, reviewed and interpreted all labs and imaging: EKG: Sinus rhythm without concerning ischemic changes, does show prolonged QT Chest x-ray: Clear without pneumonia or other active cardiopulmonary disease CBC: Mild leukopenia of 3.5, hemoglobin of 10.8 Troponin: Negative D-dimer: Not elevated at 0.28 Covid: Negative Called by lab and notified of profound hypokalemia at 2.1, will check magnesium level that patient has already been given magnesium with EMS.  Will order 5 runs of IV potassium as well as p.o. potassium supplementation Calcium of 7.7, no other significant electrolyte derangements.  Patient has not had recent diarrhea or vomiting, unclear etiology for patient's critically low hypokalemia but given EKG changes  feel this will require admission.  Case discussed with Dr. Ara Kussmaul with [facility] hospitalist who will see and admit the patient.  Final Clinical Impression(s) / ED Diagnoses Final diagnoses:  Exacerbation of asthma, unspecified asthma severity, unspecified whether persistent  Hypokalemia    Rx / DC Orders ED Discharge Orders    None       Janet Berlin 04/16/20 0014    Drenda Freeze, MD 04/16/20 [phone]

## 2020-04-15 NOTE — ED Notes (Signed)
Pt informed that we need urine sample; pt states she will ring the call bell when she is able to provide one

## 2020-04-15 NOTE — ED Notes (Signed)
Date and time results received: 04/15/20 10:16 PM  (use smartphrase ".now" to insert current time)  Test: Potassium Critical Value: 2.1  Name of Provider Notified: Dr.Yao  Orders Received? Or Actions Taken?:

## 2020-04-15 NOTE — H&P (Signed)
History and Physical   [facility] [place] @ [place] Admission History and Physical [employer], D.O.    Patient Name: Jessica Case MR#: [id number] Date of Birth: Jan 06, 1981 Date of Admission: 04/15/2020  Referring MD/NP/PA: PA [place] Primary Care Physician: [name], NP  Chief Complaint:  Chief Complaint  Patient presents with  . Shortness of Breath    HPI: Jessica Case is a 39 y.o. female with a known history of asthma since childhood. , arthritis, schizophrenia presents to the emergency department for evaluation of shortness of breath.  Patient was in a usual state of health until 2 days prior to arrival which has been progressively worsening and has been refractory to home inhaler use. She reports fever but no cough or productive sputum.   Patient denies weakness, dizziness, chest pain,N/V/C/D, abdominal pain, dysuria/frequency, changes in mental status.    Otherwise there has been no change in status. Patient has been taking medication as prescribed and there has been no recent change in medication or diet.  No recent antibiotics.  There has been no recent illness, hospitalizations, travel or sick contacts.    EMS/ED Course: Patient received albuterol, Atrovent, magnesium by EMS in route. Medical admission has been requested for further management of asthma exacerbation, severe hypokalemia.  Review of Systems:  CONSTITUTIONAL: No fever/chills, fatigue, weakness, weight gain/loss, headache. EYES: No blurry or double vision. ENT: No tinnitus, postnasal drip, redness or soreness of the oropharynx. RESPIRATORY: Positive cough, dyspnea, wheeze.  No hemoptysis.  CARDIOVASCULAR: No chest pain, palpitations, syncope, orthopnea. No lower extremity edema.  GASTROINTESTINAL: No nausea, vomiting, abdominal pain, diarrhea, constipation.  No hematemesis, melena or hematochezia. GENITOURINARY: No dysuria, frequency, hematuria. ENDOCRINE: No polyuria or  nocturia. No heat or cold intolerance. HEMATOLOGY: No anemia, bruising, bleeding. INTEGUMENTARY: No rashes, ulcers, lesions. MUSCULOSKELETAL: No arthritis, gout, dyspnea. NEUROLOGIC: No numbness, tingling, ataxia, seizure-type activity, weakness. PSYCHIATRIC: No anxiety, depression, insomnia.   Past Medical History:  Diagnosis Date  . Arthritis   . Claustrophobia   . Cold    currently - no meds  . Fallopian tube abscess   . Fibroids   . Paranoid schizophrenia ([place])   . SVD (spontaneous vaginal delivery)    x 1    Past Surgical History:  Procedure Laterality Date  . BUNIONECTOMY    . bunyonectomy Bilateral    x 1  . LAPAROSCOPIC UNILATERAL SALPINGECTOMY Left 08/18/2016   Procedure: LAPAROSCOPIC UNILATERAL SALPINGECTOMY;  Surgeon: Lavonia Drafts, MD;  Location: [facility];  Service: Gynecology;  Laterality: Left;  . LAPAROSCOPY  08/18/2016   Procedure: LAPAROSCOPY OPERATIVE;  Surgeon: Lavonia Drafts, MD;  Location: [facility];  Service: Gynecology;;  . LAPAROTOMY  08/18/2016   Procedure: LAPAROTOMY;  Surgeon: Lavonia Drafts, MD;  Location: [facility];  Service: Gynecology;;  mini laparotomy  . MYOMECTOMY  08/18/2016   Procedure: MYOMECTOMY;  Surgeon: Lavonia Drafts, MD;  Location: [facility];  Service: Gynecology;;  . TOOTH EXTRACTION       reports that she has been smoking cigarettes. She has a 2.30 pack-year smoking history. She has never used smokeless tobacco. She reports current alcohol use. She reports previous drug use. Drugs: Marijuana and "Crack" cocaine.  Allergies  Allergen Reactions  . Citrus Hives and Itching  . Food Hives, Itching and Other (See Comments)    Pt confirmed that it is not an anaphylactic reaction  . Strawberry Flavor     Hives all over body.   . Toradol [Ketorolac Tromethamine] Hives and Itching  Family History  Problem Relation Age of Onset  . Hypertension Mother     Prior to Admission medications   Medication Sig  Start Date End Date Taking? Authorizing Provider  amLODipine (NORVASC) 5 MG tablet Take 1 tablet (5 mg total) by mouth daily. 12/12/18 03/12/19  [name], NP  cyclobenzaprine (FLEXERIL) 10 MG tablet Take 1 tablet (10 mg total) by mouth 3 (three) times daily as needed for muscle spasms. Patient not taking: Reported on 04/15/2020 05/15/19   Dalia Heading, PA-C  HYDROcodone-acetaminophen (NORCO/VICODIN) 5-325 MG tablet Take 1 tablet by mouth every 6 (six) hours as needed for moderate pain. Patient not taking: Reported on 04/04/2020 05/15/19   Dalia Heading, PA-C  hydrOXYzine (VISTARIL) 50 MG capsule Take 50 mg by mouth every 6 (six) hours as needed for anxiety.  Patient not taking: Reported on 04/15/2020    [provider]  loratadine (CLARITIN) 10 MG tablet Take 1 tablet (10 mg total) by mouth daily. Patient not taking: Reported on 04/15/2020 01/29/20   [name], NP  lurasidone (LATUDA) 40 MG TABS tablet Take 40 mg by mouth daily with breakfast. Patient not taking: Reported on 04/04/2020    [provider]  olopatadine (PATANOL) 0.1 % ophthalmic solution Place 1 drop into both eyes 2 (two) times daily. For itchy eyes Patient not taking: Reported on 04/15/2020 04/04/20   Argentina Donovan, PA-C  predniSONE (DELTASONE) 50 MG tablet Take 1 tablet (50 mg total) by mouth daily. Patient not taking: Reported on 04/15/2020 05/15/19   Dalia Heading, PA-C  Prenat-Fe Poly-Methfol-FA-DHA (VITAFOL ULTRA) 29-0.6-0.4-200 MG CAPS Take 1 tablet by mouth daily. Patient not taking: Reported on 04/15/2020 07/26/19   Constant, Peggy, MD  traZODone (DESYREL) 100 MG tablet Take 200 mg by mouth at bedtime.  Patient not taking: Reported on 04/15/2020    [provider]    Physical Exam: Vitals:   04/15/20 2019 04/15/20 2026 04/15/20 2100 04/15/20 2200  BP: 122/83  116/74 (!) 104/51  Pulse: (!) 110  (!) 112 (!) 104  Resp: (!) 26  11 17   Temp: 97.8 F (36.6 C)      TempSrc: Oral     SpO2:  100% 96% 98%  Weight:  48 kg    Height:  5\' 1"  (1.549 m)      GENERAL: 39 y.o.-year-old female patient, well-developed, well-nourished lying in the bed in no acute distress.  Pleasant and cooperative.   HEENT: Head atraumatic, normocephalic. Pupils equal. Mucus membranes moist. NECK: Supple. No JVD. CHEST: Normal breath sounds bilaterally. No wheezing, rales, rhonchi or crackles. No use of accessory muscles of respiration.  No reproducible chest wall tenderness.  CARDIOVASCULAR: S1, S2 normal. No murmurs, rubs, or gallops. Cap refill <2 seconds. Pulses intact distally.  ABDOMEN: Soft, nondistended, nontender. No rebound, guarding, rigidity. Normoactive bowel sounds present in all four quadrants.  EXTREMITIES: No pedal edema, cyanosis, or clubbing. No calf tenderness or Homan's sign.  NEUROLOGIC: The patient is alert and oriented x 3. Cranial nerves II through XII are grossly intact with no focal sensorimotor deficit. PSYCHIATRIC:  Normal affect, mood, thought content. SKIN: Warm, dry, and intact without obvious rash, lesion, or ulcer.    Labs on Admission:  CBC: Recent Labs  Lab 04/15/20 2048  WBC 3.5*  NEUTROABS 2.0  HGB 10.8*  HCT 31.3*  MCV 96.0  PLT 694   Basic Metabolic Panel: Recent Labs  Lab 04/15/20 2048  NA 143  K 2.1*  CL 104  CO2 24  GLUCOSE 151*  BUN <5*  CREATININE 0.73  CALCIUM 7.7*   GFR: Estimated Creatinine Clearance: 71.2 mL/min (by C-G formula based on SCr of 0.73 mg/dL). Liver Function Tests: No results for input(s): AST, ALT, ALKPHOS, BILITOT, PROT, ALBUMIN in the last 168 hours. No results for input(s): LIPASE, AMYLASE in the last 168 hours. No results for input(s): AMMONIA in the last 168 hours. Coagulation Profile: No results for input(s): INR, PROTIME in the last 168 hours. Cardiac Enzymes: No results for input(s): CKTOTAL, CKMB, CKMBINDEX, TROPONINI in the last 168 hours. BNP (last 3 results) No results for  input(s): PROBNP in the last 8760 hours. HbA1C: No results for input(s): HGBA1C in the last 72 hours. CBG: No results for input(s): GLUCAP in the last 168 hours. Lipid Profile: No results for input(s): CHOL, HDL, LDLCALC, TRIG, CHOLHDL, LDLDIRECT in the last 72 hours. Thyroid Function Tests: No results for input(s): TSH, T4TOTAL, FREET4, T3FREE, THYROIDAB in the last 72 hours. Anemia Panel: No results for input(s): VITAMINB12, FOLATE, FERRITIN, TIBC, IRON, RETICCTPCT in the last 72 hours. Urine analysis:    Component Value Date/Time   COLORURINE YELLOW 11/04/2017 1129   APPEARANCEUR CLEAR 11/04/2017 1129   LABSPEC <1.005 (L) 11/04/2017 1129   PHURINE 5.5 11/04/2017 1129   GLUCOSEU NEGATIVE 11/04/2017 1129   HGBUR NEGATIVE 11/04/2017 1129   BILIRUBINUR negative 04/04/2020 1457   BILIRUBINUR negative 04/16/2015 1235   KETONESUR negative 04/04/2020 1457   KETONESUR NEGATIVE 11/04/2017 1129   PROTEINUR NEGATIVE 11/04/2017 1129   UROBILINOGEN 0.2 04/04/2020 1457   UROBILINOGEN 0.2 08/26/2016 1611   NITRITE Negative 04/04/2020 1457   NITRITE NEGATIVE 11/04/2017 1129   LEUKOCYTESUR Negative 04/04/2020 1457   Sepsis Labs: @LABRCNTIP (procalcitonin:4,lacticidven:4) ) Recent Results (from the past 240 hour(s))  SARS Coronavirus 2 by RT PCR (hospital order, performed in [facility] hospital lab) Nasopharyngeal Nasopharyngeal Swab     Status: None   Collection Time: 04/15/20  8:48 PM   Specimen: Nasopharyngeal Swab  Result Value Ref Range Status   SARS Coronavirus 2 NEGATIVE NEGATIVE Final    Comment: (NOTE) SARS-CoV-2 target nucleic acids are NOT DETECTED.  The SARS-CoV-2 RNA is generally detectable in upper and lower respiratory specimens during the acute phase of infection. The lowest concentration of SARS-CoV-2 viral copies this assay can detect is 250 copies / mL. A negative result does not preclude SARS-CoV-2 infection and should not be used as the sole basis for treatment or  other patient management decisions.  A negative result may occur with improper specimen collection / handling, submission of specimen other than nasopharyngeal swab, presence of viral mutation(s) within the areas targeted by this assay, and inadequate number of viral copies (<250 copies / mL). A negative result must be combined with clinical observations, patient history, and epidemiological information.  Fact Sheet for Patients:   StrictlyIdeas.no  Fact Sheet for Healthcare Providers: BankingDealers.co.za  This test is not yet approved or  cleared by the [place] FDA and has been authorized for detection and/or diagnosis of SARS-CoV-2 by FDA under an Emergency Use Authorization (EUA).  This EUA will remain in effect (meaning this test can be used) for the duration of the COVID-19 declaration under Section 564(b)(1) of the Act, 21 U.S.C. section 360bbb-3(b)(1), unless the authorization is terminated or revoked sooner.  Performed at [facility], [place] [address], [place], [place] [address]      Radiological Exams on Admission: DG Chest Port 1 View  Result Date: 04/15/2020 CLINICAL  DATA:  Asthma, shortness of breath EXAM: PORTABLE CHEST 1 VIEW COMPARISON:  None. FINDINGS: The heart size and mediastinal contours are within normal limits. Both lungs are clear. The visualized skeletal structures are unremarkable. IMPRESSION: Normal study. Electronically Signed   By: Rolm Baptise M.D.   On: 04/15/2020 20:48    EKG: Normal sinus rhythm at 98 bpm with normal axis, prolonged QTc on 568 and nonspecific ST-T wave changes.   Assessment/Plan  This is a 39 y.o. female with a history of asthma, arthritis, schizophrenia now being admitted with:  #.  Acute exacerbation of asthma -Admit observation, telemetry -O2 and nebs as needed -Oral steroids -Reconcile and resume home medications  #.  Severe hypokalemia with  PROLONGED QTc -Replaced intravenously in the emergency department -Check magnesium level -Trend BMP  #.  History of schizophrenia -Reconcile and resume home medications  #.  History of hypertension -Resume amlodipine  Admission status: Observation, telemetry IV Fluids: Hep-Lock Diet/Nutrition: Heart healthy Consults called: None DVT Px: Early ambulation. Code Status: Full Code  Disposition Plan: To home in less than 24 hours  All the records are reviewed and case discussed with ED provider. Management plans discussed with the patient and/or family who express understanding and agree with plan of care.  Callen Vancuren D.O. on 04/15/2020 at 10:56 PM CC: Primary care physician; [name], NP   04/15/2020, 10:56 PM

## 2020-04-15 NOTE — ED Triage Notes (Signed)
Pt arrived via EMS from home. Pt has hx of asthma. Pt has shortness of breath. Pt used her inhaler before EMS arrival with no improvement.

## 2020-04-16 ENCOUNTER — Other Ambulatory Visit: Payer: Self-pay

## 2020-04-16 DIAGNOSIS — R9431 Abnormal electrocardiogram [ECG] [EKG]: Secondary | ICD-10-CM | POA: Diagnosis not present

## 2020-04-16 DIAGNOSIS — J45901 Unspecified asthma with (acute) exacerbation: Secondary | ICD-10-CM | POA: Diagnosis not present

## 2020-04-16 DIAGNOSIS — E876 Hypokalemia: Secondary | ICD-10-CM | POA: Diagnosis not present

## 2020-04-16 LAB — CBC
HCT: 29.7 % — ABNORMAL LOW (ref 36.0–46.0)
Hemoglobin: 10 g/dL — ABNORMAL LOW (ref 12.0–15.0)
MCH: 33.2 pg (ref 26.0–34.0)
MCHC: 33.7 g/dL (ref 30.0–36.0)
MCV: 98.7 fL (ref 80.0–100.0)
Platelets: 202 10*3/uL (ref 150–400)
RBC: 3.01 MIL/uL — ABNORMAL LOW (ref 3.87–5.11)
RDW: 14.2 % (ref 11.5–15.5)
WBC: 3 10*3/uL — ABNORMAL LOW (ref 4.0–10.5)
nRBC: 0 % (ref 0.0–0.2)

## 2020-04-16 LAB — BASIC METABOLIC PANEL
Anion gap: 14 (ref 5–15)
BUN: <5 mg/dL — ABNORMAL LOW (ref 6–20)
CO2: 22 mmol/L (ref 22–32)
Calcium: 7.4 mg/dL — ABNORMAL LOW (ref 8.9–10.3)
Chloride: 102 mmol/L (ref 98–111)
Creatinine, Ser: 0.63 mg/dL (ref 0.44–1.00)
GFR calc Af Amer: >60 mL/min (ref >60)
GFR calc non Af Amer: >60 mL/min (ref >60)
Glucose, Bld: 183 mg/dL — ABNORMAL HIGH (ref 70–99)
Potassium: 3.2 mmol/L — ABNORMAL LOW (ref 3.5–5.1)
Sodium: 138 mmol/L (ref 135–145)

## 2020-04-16 LAB — HIV ANTIBODY (ROUTINE TESTING W REFLEX): HIV Screen 4th Generation wRfx: NONREACTIVE

## 2020-04-16 LAB — RAPID URINE DRUG SCREEN, HOSP PERFORMED
Amphetamines: NOT DETECTED
Barbiturates: NOT DETECTED
Benzodiazepines: NOT DETECTED
Cocaine: NOT DETECTED
Opiates: NOT DETECTED
Tetrahydrocannabinol: NOT DETECTED

## 2020-04-16 LAB — MAGNESIUM: Magnesium: 1.6 mg/dL — ABNORMAL LOW (ref 1.7–2.4)

## 2020-04-16 LAB — PHOSPHORUS: Phosphorus: 3.4 mg/dL (ref 2.5–4.6)

## 2020-04-16 MED ORDER — PREDNISONE 20 MG PO TABS
40.0000 mg | ORAL_TABLET | Freq: Every day | ORAL | Status: DC
  Administered 2020-04-16: 40 mg via ORAL
  Filled 2020-04-16: qty 2

## 2020-04-16 MED ORDER — PREDNISONE 20 MG PO TABS: 40.0000 mg | ORAL_TABLET | Freq: Every day | ORAL | 0 refills | Status: DC

## 2020-04-16 MED ORDER — MAGNESIUM SULFATE 2 GM/50ML IV SOLN
2.0000 g | Freq: Once | INTRAVENOUS | Status: AC
  Administered 2020-04-16: 2 g via INTRAVENOUS
  Filled 2020-04-16: qty 50

## 2020-04-16 MED ORDER — SENNOSIDES-DOCUSATE SODIUM 8.6-50 MG PO TABS: 1.0000 | ORAL_TABLET | Freq: Every evening | ORAL | Status: DC | PRN

## 2020-04-16 MED ORDER — ACETAMINOPHEN 650 MG RE SUPP: 650.0000 mg | Freq: Four times a day (QID) | RECTAL | Status: DC | PRN

## 2020-04-16 MED ORDER — ONDANSETRON HCL 4 MG PO TABS: 4.0000 mg | ORAL_TABLET | Freq: Four times a day (QID) | ORAL | Status: DC | PRN

## 2020-04-16 MED ORDER — POTASSIUM CHLORIDE CRYS ER 20 MEQ PO TBCR
40.0000 meq | EXTENDED_RELEASE_TABLET | ORAL | Status: AC
  Administered 2020-04-16 (×2): 40 meq via ORAL
  Filled 2020-04-16 (×2): qty 2

## 2020-04-16 MED ORDER — IPRATROPIUM-ALBUTEROL 0.5-2.5 (3) MG/3ML IN SOLN: 3.0000 mL | RESPIRATORY_TRACT | Status: DC | PRN

## 2020-04-16 MED ORDER — ACETAMINOPHEN 325 MG PO TABS: 650.0000 mg | ORAL_TABLET | Freq: Four times a day (QID) | ORAL | Status: DC | PRN

## 2020-04-16 MED ORDER — ONDANSETRON HCL 4 MG/2ML IJ SOLN: 4.0000 mg | Freq: Four times a day (QID) | INTRAMUSCULAR | Status: DC | PRN

## 2020-04-16 MED ORDER — BISACODYL 5 MG PO TBEC
5.0000 mg | DELAYED_RELEASE_TABLET | Freq: Every day | ORAL | Status: DC | PRN
  Filled 2020-04-16: qty 1

## 2020-04-16 MED ORDER — HYDRALAZINE HCL 20 MG/ML IJ SOLN: 5.0000 mg | Freq: Three times a day (TID) | INTRAMUSCULAR | Status: DC | PRN

## 2020-04-16 NOTE — Progress Notes (Signed)
Pt stable on room air with sats at 100% and RR at 14. Denies pain and discomfort but reports some SOB with activity. Pt wanting to know if she will still be discharged this afternoon. MD notified.

## 2020-04-16 NOTE — Discharge Summary (Signed)
Physician Discharge Summary  Jessica Case GNF:[id number] DOB: 11/26/1980 DOA: 04/15/2020  PCP: [name], NP  Admit date: 04/15/2020 Discharge date: 04/16/2020  Recommendations for Outpatient Follow-up:  1. Resolution of asthma exacerbation 2. Prolonged QTC, likely resolved, could consider repeat EKG as an outpatient 3. Note that the patient reports she is not taking any medications including Latuda   Follow-up Information    [name], NP. Schedule an appointment as soon as possible for a visit today.   Specialty: Nurse Practitioner Contact information: [place] [address] [place] [address]   Specialty: Emergency Medicine Why: Return to ED with new or worsening symptoms or concerns Contact information: [place] [id number] [place] [phone]               Discharge Diagnoses: Principal diagnosis is #1 1. Asthma exacerbation 2. Hypokalemia 3. Hypomagnesemia 4. Prolonged QTC  Discharge Condition: improved Disposition: home  Diet recommendation: regular  Filed Weights   04/15/20 2026  Weight: 48 kg    History of present illness:  39 year old woman PMH asthma, schizophrenia presented with shortness of breath.  Admitted for asthma exacerbation, severe hypokalemia with prolonged QTC.  Hospital Course:  Patient was admitted for further treatment of asthma exacerbation, responded well to steroids and bronchodilators, no hypoxia and on discharge respiratory status was much improved.  Severe hypokalemia with prolonged QTC was treated with aggressive repletion as well as repletion of magnesium.  Repeat EKG showed sinus rhythm with significant decreased QTC consistent with treatment of hypokalemia.  No previous EKGs available for comparison.  Patient reports she is not taking any medications at home other than her inhaler.  Not taking Latuda.   Expect QTC to normalize with repletion of potassium.  Hospitalization uncomplicated.  Discharged on oral steroids.  Today's assessment: S: Feels much better, breathing better.  Reports she is not taking any medications at home other than inhaler.  Not taking Latuda. O: Vitals:  Vitals:   04/16/20 1000 04/16/20 1417  BP: 138/88   Pulse: 100 96  Resp:    Temp: 98.1 F (36.7 C)   SpO2: 100% 100%    Constitutional:  . Appears calm and comfortable Respiratory:  . Few wheezes, otherwise fairly good air movement.  No rhonchi or rales. Marland Kitchen Respiratory effort normal.  Cardiovascular:  . RRR, no m/r/g . No LE extremity edema   Psychiatric:  . Mental status o Mood, affect appropriate  Discharge Instructions  Discharge Instructions    Diet general   Complete by: As directed    Discharge instructions   Complete by: As directed    Call your physician or seek immediate medical attention for shortness of breath or worsening of condition.   Increase activity slowly   Complete by: As directed      Allergies as of 04/16/2020      Reactions   Citrus Hives, Itching   Food Hives, Itching, Other (See Comments)   Pt confirmed that it is not an anaphylactic reaction   Strawberry Flavor    Hives all over body.    Toradol [ketorolac Tromethamine] Hives, Itching      Medication List    STOP taking these medications   amLODipine 5 MG tablet Commonly known as: NORVASC   cyclobenzaprine 10 MG tablet Commonly known as: FLEXERIL   HYDROcodone-acetaminophen 5-325 MG tablet Commonly known as: NORCO/VICODIN   hydrOXYzine 50 MG capsule Commonly  known as: VISTARIL   lurasidone 40 MG Tabs tablet Commonly known as: LATUDA   traZODone 100 MG tablet Commonly known as: DESYREL     TAKE these medications   loratadine 10 MG tablet Commonly known as: CLARITIN Take 1 tablet (10 mg total) by mouth daily.   olopatadine 0.1 % ophthalmic solution Commonly known as: PATANOL Place 1 drop into  both eyes 2 (two) times daily. For itchy eyes   predniSONE 20 MG tablet Commonly known as: DELTASONE Take 2 tablets (40 mg total) by mouth daily with breakfast. Start taking on: April 17, 2020 What changed:   medication strength  how much to take  when to take this   Vitafol Ultra 29-0.6-0.4-200 MG Caps Take 1 tablet by mouth daily.      Allergies  Allergen Reactions  . Citrus Hives and Itching  . Food Hives, Itching and Other (See Comments)    Pt confirmed that it is not an anaphylactic reaction  . Strawberry Flavor     Hives all over body.   . Toradol [Ketorolac Tromethamine] Hives and Itching    The results of significant diagnostics from this hospitalization (including imaging, microbiology, ancillary and laboratory) are listed below for reference.    Significant Diagnostic Studies: DG Chest Port 1 View  Result Date: 04/15/2020 CLINICAL DATA:  Asthma, shortness of breath EXAM: PORTABLE CHEST 1 VIEW COMPARISON:  None. FINDINGS: The heart size and mediastinal contours are within normal limits. Both lungs are clear. The visualized skeletal structures are unremarkable. IMPRESSION: Normal study. Electronically Signed   By: Rolm Baptise M.D.   On: 04/15/2020 20:48    Microbiology: Recent Results (from the past 240 hour(s))  SARS Coronavirus 2 by RT PCR (hospital order, performed in [facility] hospital lab) Nasopharyngeal Nasopharyngeal Swab     Status: None   Collection Time: 04/15/20  8:48 PM   Specimen: Nasopharyngeal Swab  Result Value Ref Range Status   SARS Coronavirus 2 NEGATIVE NEGATIVE Final    Comment: (NOTE) SARS-CoV-2 target nucleic acids are NOT DETECTED.  The SARS-CoV-2 RNA is generally detectable in upper and lower respiratory specimens during the acute phase of infection. The lowest concentration of SARS-CoV-2 viral copies this assay can detect is 250 copies / mL. A negative result does not preclude SARS-CoV-2 infection and should not be used as the  sole basis for treatment or other patient management decisions.  A negative result may occur with improper specimen collection / handling, submission of specimen other than nasopharyngeal swab, presence of viral mutation(s) within the areas targeted by this assay, and inadequate number of viral copies (<250 copies / mL). A negative result must be combined with clinical observations, patient history, and epidemiological information.  Fact Sheet for Patients:   StrictlyIdeas.no  Fact Sheet for Healthcare Providers: BankingDealers.co.za  This test is not yet approved or  cleared by the [place] FDA and has been authorized for detection and/or diagnosis of SARS-CoV-2 by FDA under an Emergency Use Authorization (EUA).  This EUA will remain in effect (meaning this test can be used) for the duration of the COVID-19 declaration under Section 564(b)(1) of the Act, 21 U.S.C. section 360bbb-3(b)(1), unless the authorization is terminated or revoked sooner.  Performed at [facility], [place] [address], [place], [place] [address]      Labs: Basic Metabolic Panel: Recent Labs  Lab 04/15/20 2048 04/16/20 0430  NA 143 138  K 2.1* 3.2*  CL 104 102  CO2 24 22  GLUCOSE 151* 183*  BUN <5* <5*  CREATININE 0.73 0.63  CALCIUM 7.7* 7.4*  MG 2.0 1.6*  PHOS  --  3.4   CBC: Recent Labs  Lab 04/15/20 2048 04/16/20 0430  WBC 3.5* 3.0*  NEUTROABS 2.0  --   HGB 10.8* 10.0*  HCT 31.3* 29.7*  MCV 96.0 98.7  PLT 201 202    Active Problems:   Asthma exacerbation   Time coordinating discharge: 35 minutes  Signed:  Murray Hodgkins, MD  [facility]  04/16/2020, 6:08 PM

## 2020-04-17 ENCOUNTER — Telehealth: Payer: Self-pay

## 2020-04-17 NOTE — Telephone Encounter (Signed)
Transition Care Management Follow-up Telephone Call Date of discharge and from where: 04/16/2020, [facility]  Call placed to patient # [phone], message left with call back requested to this CM # [phone].  She has an appt with Geryl Rankins, NP on 04/23/2020

## 2020-04-18 ENCOUNTER — Telehealth: Payer: Self-pay

## 2020-04-18 NOTE — Telephone Encounter (Signed)
Transition Care Management Follow-up Telephone Call Attempt #2 Date of discharge and from where: 04/16/2020, [facility]  Call placed to patient # [phone], Tedra Senegal answered. He said that he would have to get her and would have her call this CM back.    She has an appt with Geryl Rankins, NP on 04/23/2020

## 2020-04-23 ENCOUNTER — Encounter: Payer: Medicaid Other | Admitting: Nurse Practitioner

## 2020-05-03 MED FILL — IBUPROFEN 600 MG TABLET: 600 | 8 days supply | Qty: 30 | Fill #0

## 2020-09-23 ENCOUNTER — Emergency Department (HOSPITAL_COMMUNITY)

## 2020-09-23 ENCOUNTER — Inpatient Hospital Stay (HOSPITAL_COMMUNITY)
Admission: EM | Admit: 2020-09-23 | Discharge: 2020-09-27 | DRG: 894 | Discharge status: Against Medical Advice | Attending: Internal Medicine | Admitting: Internal Medicine

## 2020-09-23 ENCOUNTER — Other Ambulatory Visit: Payer: Self-pay

## 2020-09-23 ENCOUNTER — Encounter (HOSPITAL_COMMUNITY): Payer: Self-pay

## 2020-09-23 DIAGNOSIS — F10129 Alcohol abuse with intoxication, unspecified: Secondary | ICD-10-CM | POA: Diagnosis present

## 2020-09-23 DIAGNOSIS — D6489 Other specified anemias: Secondary | ICD-10-CM | POA: Diagnosis present

## 2020-09-23 DIAGNOSIS — J181 Lobar pneumonia, unspecified organism: Secondary | ICD-10-CM | POA: Diagnosis present

## 2020-09-23 DIAGNOSIS — R17 Unspecified jaundice: Secondary | ICD-10-CM | POA: Diagnosis present

## 2020-09-23 DIAGNOSIS — Z20822 Contact with and (suspected) exposure to covid-19: Secondary | ICD-10-CM | POA: Diagnosis present

## 2020-09-23 DIAGNOSIS — F200 Paranoid schizophrenia: Secondary | ICD-10-CM | POA: Diagnosis present

## 2020-09-23 DIAGNOSIS — I10 Essential (primary) hypertension: Secondary | ICD-10-CM | POA: Diagnosis present

## 2020-09-23 DIAGNOSIS — F17290 Nicotine dependence, other tobacco product, uncomplicated: Secondary | ICD-10-CM | POA: Diagnosis present

## 2020-09-23 DIAGNOSIS — D6959 Other secondary thrombocytopenia: Secondary | ICD-10-CM | POA: Diagnosis present

## 2020-09-23 DIAGNOSIS — I4581 Long QT syndrome: Secondary | ICD-10-CM | POA: Diagnosis present

## 2020-09-23 DIAGNOSIS — F419 Anxiety disorder, unspecified: Secondary | ICD-10-CM | POA: Diagnosis present

## 2020-09-23 DIAGNOSIS — E876 Hypokalemia: Secondary | ICD-10-CM | POA: Diagnosis present

## 2020-09-23 DIAGNOSIS — Z5329 Procedure and treatment not carried out because of patient's decision for other reasons: Secondary | ICD-10-CM | POA: Diagnosis not present

## 2020-09-23 DIAGNOSIS — F10239 Alcohol dependence with withdrawal, unspecified: Secondary | ICD-10-CM

## 2020-09-23 DIAGNOSIS — F10930 Alcohol use, unspecified with withdrawal, uncomplicated: Secondary | ICD-10-CM

## 2020-09-23 DIAGNOSIS — I471 Supraventricular tachycardia: Secondary | ICD-10-CM | POA: Diagnosis not present

## 2020-09-23 DIAGNOSIS — Y906 Blood alcohol level of 120-199 mg/100 ml: Secondary | ICD-10-CM | POA: Diagnosis present

## 2020-09-23 DIAGNOSIS — F10231 Alcohol dependence with withdrawal delirium: Principal | ICD-10-CM | POA: Diagnosis present

## 2020-09-23 DIAGNOSIS — F17210 Nicotine dependence, cigarettes, uncomplicated: Secondary | ICD-10-CM | POA: Diagnosis present

## 2020-09-23 DIAGNOSIS — R569 Unspecified convulsions: Secondary | ICD-10-CM

## 2020-09-23 DIAGNOSIS — J189 Pneumonia, unspecified organism: Secondary | ICD-10-CM | POA: Diagnosis present

## 2020-09-23 DIAGNOSIS — Z8249 Family history of ischemic heart disease and other diseases of the circulatory system: Secondary | ICD-10-CM

## 2020-09-23 DIAGNOSIS — R7989 Other specified abnormal findings of blood chemistry: Secondary | ICD-10-CM | POA: Diagnosis present

## 2020-09-23 DIAGNOSIS — R001 Bradycardia, unspecified: Secondary | ICD-10-CM | POA: Diagnosis not present

## 2020-09-23 DIAGNOSIS — F10939 Alcohol use, unspecified with withdrawal, unspecified: Secondary | ICD-10-CM

## 2020-09-23 DIAGNOSIS — I952 Hypotension due to drugs: Secondary | ICD-10-CM | POA: Diagnosis not present

## 2020-09-23 DIAGNOSIS — J45909 Unspecified asthma, uncomplicated: Secondary | ICD-10-CM | POA: Diagnosis present

## 2020-09-23 DIAGNOSIS — T424X5A Adverse effect of benzodiazepines, initial encounter: Secondary | ICD-10-CM | POA: Diagnosis not present

## 2020-09-23 DIAGNOSIS — G4089 Other seizures: Secondary | ICD-10-CM | POA: Diagnosis present

## 2020-09-23 DIAGNOSIS — F1020 Alcohol dependence, uncomplicated: Secondary | ICD-10-CM | POA: Diagnosis present

## 2020-09-23 DIAGNOSIS — L680 Hirsutism: Secondary | ICD-10-CM | POA: Diagnosis present

## 2020-09-23 LAB — D-DIMER, QUANTITATIVE: D-Dimer, Quant: 0.54 ug/mL-FEU — ABNORMAL HIGH (ref 0.00–0.50)

## 2020-09-23 LAB — CBC WITH DIFFERENTIAL/PLATELET
Abs Immature Granulocytes: 0.09 10*3/uL — ABNORMAL HIGH (ref 0.00–0.07)
Basophils Absolute: 0.1 10*3/uL (ref 0.0–0.1)
Basophils Relative: 1 %
Eosinophils Absolute: 0 10*3/uL (ref 0.0–0.5)
Eosinophils Relative: 0 %
HCT: 34.8 % — ABNORMAL LOW (ref 36.0–46.0)
Hemoglobin: 12.1 g/dL (ref 12.0–15.0)
Immature Granulocytes: 1 %
Lymphocytes Relative: 6 %
Lymphs Abs: 0.6 10*3/uL — ABNORMAL LOW (ref 0.7–4.0)
MCH: 33.1 pg (ref 26.0–34.0)
MCHC: 34.8 g/dL (ref 30.0–36.0)
MCV: 95.1 fL (ref 80.0–100.0)
Monocytes Absolute: 0.7 10*3/uL (ref 0.1–1.0)
Monocytes Relative: 6 %
Neutro Abs: 9.2 10*3/uL — ABNORMAL HIGH (ref 1.7–7.7)
Neutrophils Relative %: 86 %
Platelets: 147 10*3/uL — ABNORMAL LOW (ref 150–400)
RBC: 3.66 MIL/uL — ABNORMAL LOW (ref 3.87–5.11)
RDW: 14.8 % (ref 11.5–15.5)
WBC: 10.7 10*3/uL — ABNORMAL HIGH (ref 4.0–10.5)
nRBC: 0 % (ref 0.0–0.2)

## 2020-09-23 LAB — BLOOD GAS, VENOUS
Acid-Base Excess: 0.6 mmol/L (ref 0.0–2.0)
Bicarbonate: 24.4 mmol/L (ref 20.0–28.0)
O2 Saturation: 47.8 %
Patient temperature: 98.6
pCO2, Ven: 37.9 mmHg — ABNORMAL LOW (ref 44.0–60.0)
pH, Ven: 7.424 (ref 7.250–7.430)
pO2, Ven: 32.2 mmHg (ref 32.0–45.0)

## 2020-09-23 LAB — ETHANOL: Alcohol, Ethyl (B): 125 mg/dL — ABNORMAL HIGH (ref <10)

## 2020-09-23 MED ORDER — ALBUTEROL SULFATE HFA 108 (90 BASE) MCG/ACT IN AERS
2.0000 | INHALATION_SPRAY | Freq: Once | RESPIRATORY_TRACT | Status: AC
  Administered 2020-09-23: 2 via RESPIRATORY_TRACT
  Filled 2020-09-23: qty 6.7

## 2020-09-23 MED ORDER — SODIUM CHLORIDE 0.9 % IV BOLUS
1000.0000 mL | Freq: Once | INTRAVENOUS | Status: AC
  Administered 2020-09-23: 1000 mL via INTRAVENOUS

## 2020-09-23 MED ORDER — ACETAMINOPHEN 325 MG PO TABS
650.0000 mg | ORAL_TABLET | Freq: Once | ORAL | Status: AC
  Administered 2020-09-23: 650 mg via ORAL
  Filled 2020-09-23: qty 2

## 2020-09-23 MED ORDER — ALUM & MAG HYDROXIDE-SIMETH 200-200-20 MG/5ML PO SUSP
30.0000 mL | Freq: Once | ORAL | Status: AC
  Administered 2020-09-24: 30 mL via ORAL
  Filled 2020-09-23: qty 30

## 2020-09-23 NOTE — ED Triage Notes (Signed)
Pt arrived via [facility] from [facility]. Pt reports left sided chest pain, tender on palpation. EMS reports diminished lung sounds. Hx of HTN and alcohol abuse.

## 2020-09-23 NOTE — ED Provider Notes (Signed)
[facility] DEPT Provider Note   CSN: [id number] Arrival date & time: 09/23/20  2107     History Chief Complaint  Patient presents with  . Chest Pain    Shyasia Funches is a 39 y.o. female.  The history is provided by the patient and medical records.  Chest Pain  Fadia Marlar is a 39 y.o. female who presents to the Emergency Department complaining of chest pain. She presents the emergency department accompanied by [facility] detention officer for evaluation of chest pain. She states that she started feeling poorly today with left-sided chest pain with associated cough productive of green sputum, shortness of breath and vomiting. She states that pain is worse with breathing. She denies fevers. No known sick contacts. No leg swelling or pain. She has a history of schizophrenia. No history of blood clots. She has been fully vaccinated for COVID-19. She drinks alcohol, about 4, 40 ounce beverages daily. Last drink yesterday. Denies history of withdrawal.    Past Medical History:  Diagnosis Date  . Arthritis   . Claustrophobia   . Cold    currently - no meds  . Fallopian tube abscess   . Fibroids   . Paranoid schizophrenia ([place])   . SVD (spontaneous vaginal delivery)    x 1    Patient Active Problem List   Diagnosis Date Noted  . Prolonged QT interval 04/16/2020  . Asthma exacerbation 04/15/2020  . Paranoid schizophrenia ([place]) 10/31/2017  . Right foot pain 05/11/2017  . Essential hypertension 10/08/2016  . Cocaine substance abuse ([place]) 10/08/2016  . Fibroids, subserous 08/18/2016  . Hydrosalpinx 04/13/2016  . Tobacco dependency 09/04/2015  . Migraine with status migrainosus 08/15/2015  . Allergic rhinitis 07/18/2015  . Pain due to dental caries 07/12/2014    Past Surgical History:  Procedure Laterality Date  . BUNIONECTOMY    . bunyonectomy Bilateral    x 1  . LAPAROSCOPIC UNILATERAL SALPINGECTOMY Left 08/18/2016    Procedure: LAPAROSCOPIC UNILATERAL SALPINGECTOMY;  Surgeon: Lavonia Drafts, MD;  Location: [facility];  Service: Gynecology;  Laterality: Left;  . LAPAROSCOPY  08/18/2016   Procedure: LAPAROSCOPY OPERATIVE;  Surgeon: Lavonia Drafts, MD;  Location: [facility];  Service: Gynecology;;  . LAPAROTOMY  08/18/2016   Procedure: LAPAROTOMY;  Surgeon: Lavonia Drafts, MD;  Location: [facility];  Service: Gynecology;;  mini laparotomy  . MYOMECTOMY  08/18/2016   Procedure: MYOMECTOMY;  Surgeon: Lavonia Drafts, MD;  Location: [facility];  Service: Gynecology;;  . TOOTH EXTRACTION       OB History    Gravida  2   Para  1   Term  0   Preterm  1   AB  1   Living  0     SAB  1   TAB  0   Ectopic      Multiple      Live Births              Family History  Problem Relation Age of Onset  . Hypertension Mother     Social History   Tobacco Use  . Smoking status: Current Every Day Smoker    Packs/day: 0.10    Years: 23.00    Pack years: 2.30    Types: Cigarettes  . Smokeless tobacco: Never Used  Vaping Use  . Vaping Use: Never used  Substance Use Topics  . Alcohol use: Yes    Alcohol/week: 0.0 standard drinks    Comment: beer 40 oz /week  .  Drug use: Not Currently    Types: Marijuana, "Crack" cocaine    Home Medications Prior to Admission medications   Medication Sig Start Date End Date Taking? Authorizing Provider  loratadine (CLARITIN) 10 MG tablet Take 1 tablet (10 mg total) by mouth daily. Patient not taking: Reported on 04/15/2020 01/29/20   [name], NP  olopatadine (PATANOL) 0.1 % ophthalmic solution Place 1 drop into both eyes 2 (two) times daily. For itchy eyes Patient not taking: Reported on 04/15/2020 04/04/20   Argentina Donovan, PA-C  predniSONE (DELTASONE) 20 MG tablet Take 2 tablets (40 mg total) by mouth daily with breakfast. 04/17/20   Samuella Cota, MD  Prenat-Fe Poly-Methfol-FA-DHA (VITAFOL ULTRA) 29-0.6-0.4-200 MG CAPS Take 1  tablet by mouth daily. Patient not taking: Reported on 04/15/2020 07/26/19   Constant, Peggy, MD    Allergies    Citrus, Food, Strawberry flavor, and Toradol [ketorolac tromethamine]  Review of Systems   Review of Systems  Cardiovascular: Positive for chest pain.  All other systems reviewed and are negative.   Physical Exam Updated Vital Signs BP (!) 140/101   Pulse (!) 118   Temp 99.3 F (37.4 C) (Oral)   Resp 20   Ht 5\' 1"  (1.549 m)   Wt 49.9 kg   LMP 09/16/2020   SpO2 95%   BMI 20.78 kg/m   Physical Exam Vitals and nursing note reviewed.  Constitutional:      Appearance: She is well-developed.  HENT:     Head: Normocephalic and atraumatic.  Cardiovascular:     Rate and Rhythm: Regular rhythm. Tachycardia present.     Heart sounds: No murmur heard.   Pulmonary:     Effort: Pulmonary effort is normal. No respiratory distress.     Comments: Decreased air movement bilaterally Abdominal:     Palpations: Abdomen is soft.     Tenderness: There is no guarding or rebound.     Comments: Moderate generalized abdominal tenderness  Musculoskeletal:        General: No tenderness.  Skin:    General: Skin is warm and dry.  Neurological:     Mental Status: She is alert and oriented to person, place, and time.  Psychiatric:        Behavior: Behavior normal.     ED Results / Procedures / Treatments   Labs (all labs ordered are listed, but only abnormal results are displayed) Labs Reviewed  COMPREHENSIVE METABOLIC PANEL - Abnormal; Notable for the following components:      Result Value   Sodium 134 (*)    Chloride 97 (*)    CO2 20 (*)    Glucose, Bld 109 (*)    BUN <5 (*)    Calcium 8.3 (*)    AST 144 (*)    ALT 56 (*)    Alkaline Phosphatase 137 (*)    Total Bilirubin 1.8 (*)    Anion gap 17 (*)    All other components within normal limits  ETHANOL - Abnormal; Notable for the following components:   Alcohol, Ethyl (B) 125 (*)    All other components within  normal limits  CBC WITH DIFFERENTIAL/PLATELET - Abnormal; Notable for the following components:   WBC 10.7 (*)    RBC 3.66 (*)    HCT 34.8 (*)    Platelets 147 (*)    Neutro Abs 9.2 (*)    Lymphs Abs 0.6 (*)    Abs Immature Granulocytes 0.09 (*)    All other  components within normal limits  BLOOD GAS, VENOUS - Abnormal; Notable for the following components:   pCO2, Ven 37.9 (*)    All other components within normal limits  D-DIMER, QUANTITATIVE (NOT AT [facility]) - Abnormal; Notable for the following components:   D-Dimer, Quant 0.54 (*)    All other components within normal limits  RESP PANEL BY RT-PCR (FLU A&B, COVID) ARPGX2  LIPASE, BLOOD  RAPID URINE DRUG SCREEN, HOSP PERFORMED  URINALYSIS, ROUTINE W REFLEX MICROSCOPIC  HCG, QUANTITATIVE, PREGNANCY  I-STAT BETA HCG BLOOD, ED ([facility], [facility], [facility] ONLY)  TROPONIN I (HIGH SENSITIVITY)  TROPONIN I (HIGH SENSITIVITY)    EKG None  Radiology DG Chest Port 1 View  Result Date: 09/23/2020 CLINICAL DATA:  Chest pain EXAM: PORTABLE CHEST 1 VIEW COMPARISON:  04/15/2020 FINDINGS: Right lung is clear. Airspace disease in the left lower lobe. Normal cardiomediastinal silhouette. No pneumothorax. IMPRESSION: Left lower lobe pneumonia. Electronically Signed   By: Donavan Foil M.D.   On: 09/23/2020 22:44    Procedures Procedures (including critical care time)  Medications Ordered in ED Medications  sodium chloride 0.9 % bolus 1,000 mL (1,000 mLs Intravenous New Bag/Given (Non-Interop) 09/23/20 2318)  albuterol (VENTOLIN HFA) 108 (90 Base) MCG/ACT inhaler 2 puff (2 puffs Inhalation Given 09/23/20 2258)  acetaminophen (TYLENOL) tablet 650 mg (650 mg Oral Given 09/23/20 2315)  alum & mag hydroxide-simeth (MAALOX/MYLANTA) 200-200-20 MG/5ML suspension 30 mL (30 mLs Oral Given 09/24/20 0019)    ED Course  I have reviewed the triage vital signs and the nursing notes.  Pertinent labs & imaging results that were available during my care of the patient were  reviewed by me and considered in my medical decision making (see chart for details).    MDM Rules/Calculators/A&P                         Patient with history of schizophrenia, asthma here in police custody for evaluation of shortness of breath, vomiting. On evaluation she has abdominal tenderness without peritoneal findings. She has decreased air movement on lung exam but has poor cooperation with deep breaths. D dimer was obtained, which is mildly elevated. She also has left upper quadrant tenderness, plan to obtain CT abdomen pelvis, CT PE study.  Pt care transferred pending imaging and re-assessment.    Final Clinical Impression(s) / ED Diagnoses Final diagnoses:  None    Rx / DC Orders ED Discharge Orders    None       Quintella Reichert, MD 09/24/20 0020

## 2020-09-24 ENCOUNTER — Encounter (HOSPITAL_COMMUNITY): Payer: Self-pay

## 2020-09-24 ENCOUNTER — Emergency Department (HOSPITAL_COMMUNITY)

## 2020-09-24 DIAGNOSIS — F1020 Alcohol dependence, uncomplicated: Secondary | ICD-10-CM | POA: Diagnosis present

## 2020-09-24 DIAGNOSIS — F10939 Alcohol use, unspecified with withdrawal, unspecified: Secondary | ICD-10-CM

## 2020-09-24 DIAGNOSIS — F10239 Alcohol dependence with withdrawal, unspecified: Secondary | ICD-10-CM | POA: Diagnosis present

## 2020-09-24 DIAGNOSIS — Z5329 Procedure and treatment not carried out because of patient's decision for other reasons: Secondary | ICD-10-CM | POA: Diagnosis not present

## 2020-09-24 DIAGNOSIS — I4581 Long QT syndrome: Secondary | ICD-10-CM | POA: Diagnosis present

## 2020-09-24 DIAGNOSIS — R569 Unspecified convulsions: Secondary | ICD-10-CM | POA: Diagnosis not present

## 2020-09-24 DIAGNOSIS — Z20822 Contact with and (suspected) exposure to covid-19: Secondary | ICD-10-CM | POA: Diagnosis present

## 2020-09-24 DIAGNOSIS — R7989 Other specified abnormal findings of blood chemistry: Secondary | ICD-10-CM | POA: Diagnosis not present

## 2020-09-24 DIAGNOSIS — J189 Pneumonia, unspecified organism: Secondary | ICD-10-CM | POA: Diagnosis not present

## 2020-09-24 DIAGNOSIS — F10230 Alcohol dependence with withdrawal, uncomplicated: Secondary | ICD-10-CM | POA: Diagnosis not present

## 2020-09-24 DIAGNOSIS — Y906 Blood alcohol level of 120-199 mg/100 ml: Secondary | ICD-10-CM | POA: Diagnosis present

## 2020-09-24 DIAGNOSIS — F200 Paranoid schizophrenia: Secondary | ICD-10-CM | POA: Diagnosis present

## 2020-09-24 DIAGNOSIS — R17 Unspecified jaundice: Secondary | ICD-10-CM | POA: Diagnosis present

## 2020-09-24 DIAGNOSIS — I471 Supraventricular tachycardia: Secondary | ICD-10-CM | POA: Diagnosis not present

## 2020-09-24 DIAGNOSIS — J181 Lobar pneumonia, unspecified organism: Secondary | ICD-10-CM | POA: Diagnosis present

## 2020-09-24 DIAGNOSIS — F17290 Nicotine dependence, other tobacco product, uncomplicated: Secondary | ICD-10-CM | POA: Diagnosis present

## 2020-09-24 DIAGNOSIS — J45909 Unspecified asthma, uncomplicated: Secondary | ICD-10-CM

## 2020-09-24 DIAGNOSIS — I952 Hypotension due to drugs: Secondary | ICD-10-CM | POA: Diagnosis not present

## 2020-09-24 DIAGNOSIS — D6489 Other specified anemias: Secondary | ICD-10-CM | POA: Diagnosis present

## 2020-09-24 DIAGNOSIS — G4089 Other seizures: Secondary | ICD-10-CM | POA: Diagnosis present

## 2020-09-24 DIAGNOSIS — F17210 Nicotine dependence, cigarettes, uncomplicated: Secondary | ICD-10-CM | POA: Diagnosis present

## 2020-09-24 DIAGNOSIS — F10231 Alcohol dependence with withdrawal delirium: Secondary | ICD-10-CM | POA: Diagnosis present

## 2020-09-24 DIAGNOSIS — D696 Thrombocytopenia, unspecified: Secondary | ICD-10-CM | POA: Diagnosis not present

## 2020-09-24 DIAGNOSIS — F419 Anxiety disorder, unspecified: Secondary | ICD-10-CM | POA: Diagnosis present

## 2020-09-24 DIAGNOSIS — L680 Hirsutism: Secondary | ICD-10-CM | POA: Diagnosis present

## 2020-09-24 DIAGNOSIS — T424X5A Adverse effect of benzodiazepines, initial encounter: Secondary | ICD-10-CM | POA: Diagnosis not present

## 2020-09-24 DIAGNOSIS — I10 Essential (primary) hypertension: Secondary | ICD-10-CM | POA: Diagnosis present

## 2020-09-24 DIAGNOSIS — E876 Hypokalemia: Secondary | ICD-10-CM | POA: Diagnosis present

## 2020-09-24 DIAGNOSIS — Z8249 Family history of ischemic heart disease and other diseases of the circulatory system: Secondary | ICD-10-CM | POA: Diagnosis not present

## 2020-09-24 DIAGNOSIS — R001 Bradycardia, unspecified: Secondary | ICD-10-CM | POA: Diagnosis not present

## 2020-09-24 DIAGNOSIS — D6959 Other secondary thrombocytopenia: Secondary | ICD-10-CM | POA: Diagnosis present

## 2020-09-24 DIAGNOSIS — F10129 Alcohol abuse with intoxication, unspecified: Secondary | ICD-10-CM | POA: Diagnosis present

## 2020-09-24 HISTORY — DX: Pneumonia, unspecified organism: J18.9

## 2020-09-24 HISTORY — DX: Unspecified convulsions: R56.9

## 2020-09-24 HISTORY — DX: Alcohol dependence with withdrawal, unspecified: F10.239

## 2020-09-24 HISTORY — DX: Alcohol use, unspecified with withdrawal, unspecified: F10.939

## 2020-09-24 LAB — CBC
HCT: 31.4 % — ABNORMAL LOW (ref 36.0–46.0)
Hemoglobin: 11.2 g/dL — ABNORMAL LOW (ref 12.0–15.0)
MCH: 33.5 pg (ref 26.0–34.0)
MCHC: 35.7 g/dL (ref 30.0–36.0)
MCV: 94 fL (ref 80.0–100.0)
Platelets: 135 10*3/uL — ABNORMAL LOW (ref 150–400)
RBC: 3.34 MIL/uL — ABNORMAL LOW (ref 3.87–5.11)
RDW: 14.6 % (ref 11.5–15.5)
WBC: 11.5 10*3/uL — ABNORMAL HIGH (ref 4.0–10.5)
nRBC: 0 % (ref 0.0–0.2)

## 2020-09-24 LAB — STREP PNEUMONIAE URINARY ANTIGEN: Strep Pneumo Urinary Antigen: NEGATIVE

## 2020-09-24 LAB — COMPREHENSIVE METABOLIC PANEL
ALT: 56 U/L — ABNORMAL HIGH (ref 0–44)
ALT: 54 U/L — ABNORMAL HIGH (ref 0–44)
AST: 144 U/L — ABNORMAL HIGH (ref 15–41)
AST: 116 U/L — ABNORMAL HIGH (ref 15–41)
Albumin: 3.8 g/dL (ref 3.5–5.0)
Albumin: 3.5 g/dL (ref 3.5–5.0)
Alkaline Phosphatase: 137 U/L — ABNORMAL HIGH (ref 38–126)
Alkaline Phosphatase: 121 U/L (ref 38–126)
Anion gap: 17 — ABNORMAL HIGH (ref 5–15)
Anion gap: 15 (ref 5–15)
BUN: <5 mg/dL — ABNORMAL LOW (ref 6–20)
BUN: <5 mg/dL — ABNORMAL LOW (ref 6–20)
CO2: 20 mmol/L — ABNORMAL LOW (ref 22–32)
CO2: 24 mmol/L (ref 22–32)
Calcium: 8.3 mg/dL — ABNORMAL LOW (ref 8.9–10.3)
Calcium: 8.2 mg/dL — ABNORMAL LOW (ref 8.9–10.3)
Chloride: 97 mmol/L — ABNORMAL LOW (ref 98–111)
Chloride: 98 mmol/L (ref 98–111)
Creatinine, Ser: 0.53 mg/dL (ref 0.44–1.00)
Creatinine, Ser: 0.6 mg/dL (ref 0.44–1.00)
GFR, Estimated: >60 mL/min (ref >60)
GFR, Estimated: >60 mL/min (ref >60)
Glucose, Bld: 109 mg/dL — ABNORMAL HIGH (ref 70–99)
Glucose, Bld: 125 mg/dL — ABNORMAL HIGH (ref 70–99)
Potassium: 4.1 mmol/L (ref 3.5–5.1)
Potassium: 3.2 mmol/L — ABNORMAL LOW (ref 3.5–5.1)
Sodium: 134 mmol/L — ABNORMAL LOW (ref 135–145)
Sodium: 137 mmol/L (ref 135–145)
Total Bilirubin: 1.8 mg/dL — ABNORMAL HIGH (ref 0.3–1.2)
Total Bilirubin: 1.9 mg/dL — ABNORMAL HIGH (ref 0.3–1.2)
Total Protein: 7.2 g/dL (ref 6.5–8.1)
Total Protein: 6.6 g/dL (ref 6.5–8.1)

## 2020-09-24 LAB — MAGNESIUM: Magnesium: 1.1 mg/dL — ABNORMAL LOW (ref 1.7–2.4)

## 2020-09-24 LAB — HIV ANTIBODY (ROUTINE TESTING W REFLEX): HIV Screen 4th Generation wRfx: NONREACTIVE

## 2020-09-24 LAB — RAPID URINE DRUG SCREEN, HOSP PERFORMED
Amphetamines: NOT DETECTED
Barbiturates: NOT DETECTED
Benzodiazepines: NOT DETECTED
Cocaine: NOT DETECTED
Opiates: NOT DETECTED
Tetrahydrocannabinol: NOT DETECTED

## 2020-09-24 LAB — RESP PANEL BY RT-PCR (FLU A&B, COVID) ARPGX2
Influenza A by PCR: NEGATIVE
Influenza B by PCR: NEGATIVE
SARS Coronavirus 2 by RT PCR: NEGATIVE

## 2020-09-24 LAB — TROPONIN I (HIGH SENSITIVITY)
Troponin I (High Sensitivity): 7 ng/L
Troponin I (High Sensitivity): 7 ng/L (ref <18)

## 2020-09-24 LAB — PROCALCITONIN: Procalcitonin: 0.92 ng/mL

## 2020-09-24 LAB — MRSA PCR SCREENING: MRSA by PCR: NEGATIVE

## 2020-09-24 LAB — LIPASE, BLOOD: Lipase: 30 U/L (ref 11–51)

## 2020-09-24 LAB — HCG, QUANTITATIVE, PREGNANCY: hCG, Beta Chain, Quant, S: 1 m[IU]/mL (ref <5)

## 2020-09-24 LAB — PHOSPHORUS: Phosphorus: 2.9 mg/dL (ref 2.5–4.6)

## 2020-09-24 MED ORDER — SODIUM CHLORIDE 0.9 % IV SOLN
1.0000 g | Freq: Once | INTRAVENOUS | Status: AC
  Administered 2020-09-24: 1 g via INTRAVENOUS
  Filled 2020-09-24: qty 10

## 2020-09-24 MED ORDER — LORAZEPAM 2 MG/ML IJ SOLN
1.0000 mg | INTRAMUSCULAR | Status: DC | PRN
  Administered 2020-09-24: 1 mg via INTRAVENOUS
  Administered 2020-09-25: 2 mg via INTRAVENOUS
  Filled 2020-09-24: qty 2

## 2020-09-24 MED ORDER — THIAMINE HCL 100 MG/ML IJ SOLN: 100.0000 mg | Freq: Every day | INTRAMUSCULAR | Status: DC

## 2020-09-24 MED ORDER — SENNOSIDES-DOCUSATE SODIUM 8.6-50 MG PO TABS: 1.0000 | ORAL_TABLET | Freq: Every evening | ORAL | Status: DC | PRN

## 2020-09-24 MED ORDER — ORAL CARE MOUTH RINSE
15.0000 mL | Freq: Two times a day (BID) | OROMUCOSAL | Status: DC
  Administered 2020-09-24 – 2020-09-26 (×4): 15 mL via OROMUCOSAL

## 2020-09-24 MED ORDER — FOLIC ACID 1 MG PO TABS
1.0000 mg | ORAL_TABLET | Freq: Every day | ORAL | Status: DC
  Administered 2020-09-24 – 2020-09-27 (×4): 1 mg via ORAL
  Filled 2020-09-24 (×4): qty 1

## 2020-09-24 MED ORDER — THIAMINE HCL 100 MG PO TABS: 100.0000 mg | ORAL_TABLET | Freq: Every day | ORAL | Status: DC

## 2020-09-24 MED ORDER — LORAZEPAM 2 MG/ML IJ SOLN: 0.0000 mg | Freq: Two times a day (BID) | INTRAMUSCULAR | Status: DC

## 2020-09-24 MED ORDER — DOXYCYCLINE HYCLATE 100 MG PO TABS
100.0000 mg | ORAL_TABLET | Freq: Once | ORAL | Status: AC
  Administered 2020-09-24: 100 mg via ORAL
  Filled 2020-09-24: qty 1

## 2020-09-24 MED ORDER — LORAZEPAM 2 MG/ML IJ SOLN: 0.0000 mg | Freq: Four times a day (QID) | INTRAMUSCULAR | Status: DC

## 2020-09-24 MED ORDER — LORAZEPAM 2 MG/ML IJ SOLN
1.0000 mg | Freq: Once | INTRAMUSCULAR | Status: AC
  Administered 2020-09-24: 1 mg via INTRAVENOUS
  Filled 2020-09-24: qty 1

## 2020-09-24 MED ORDER — SODIUM CHLORIDE 0.9% FLUSH
3.0000 mL | Freq: Two times a day (BID) | INTRAVENOUS | Status: DC
  Administered 2020-09-24 – 2020-09-27 (×5): 3 mL via INTRAVENOUS

## 2020-09-24 MED ORDER — LABETALOL HCL 5 MG/ML IV SOLN: 10.0000 mg | INTRAVENOUS | Status: DC | PRN

## 2020-09-24 MED ORDER — LORAZEPAM 1 MG PO TABS: 0.0000 mg | ORAL_TABLET | Freq: Two times a day (BID) | ORAL | Status: DC

## 2020-09-24 MED ORDER — LORAZEPAM 1 MG PO TABS
1.0000 mg | ORAL_TABLET | ORAL | Status: DC | PRN
  Administered 2020-09-25 (×4): 1 mg via ORAL
  Filled 2020-09-24: qty 1
  Filled 2020-09-24: qty 2
  Filled 2020-09-24: qty 1

## 2020-09-24 MED ORDER — POTASSIUM CHLORIDE CRYS ER 20 MEQ PO TBCR
40.0000 meq | EXTENDED_RELEASE_TABLET | ORAL | Status: AC
  Administered 2020-09-24 (×2): 40 meq via ORAL
  Filled 2020-09-24 (×2): qty 2

## 2020-09-24 MED ORDER — CHLORHEXIDINE GLUCONATE CLOTH 2 % EX PADS
6.0000 | MEDICATED_PAD | Freq: Every day | CUTANEOUS | Status: DC
  Administered 2020-09-24 – 2020-09-26 (×3): 6 via TOPICAL

## 2020-09-24 MED ORDER — SODIUM CHLORIDE 0.9 % IV SOLN: INTRAVENOUS | Status: AC

## 2020-09-24 MED ORDER — LABETALOL HCL 5 MG/ML IV SOLN
10.0000 mg | INTRAVENOUS | Status: DC | PRN
  Administered 2020-09-24 – 2020-09-25 (×6): 10 mg via INTRAVENOUS
  Filled 2020-09-24 (×7): qty 4

## 2020-09-24 MED ORDER — LORAZEPAM 1 MG PO TABS: 0.0000 mg | ORAL_TABLET | Freq: Four times a day (QID) | ORAL | Status: DC

## 2020-09-24 MED ORDER — ENOXAPARIN SODIUM 40 MG/0.4ML ~~LOC~~ SOLN
40.0000 mg | SUBCUTANEOUS | Status: DC
  Administered 2020-09-24 – 2020-09-26 (×3): 40 mg via SUBCUTANEOUS
  Filled 2020-09-24 (×4): qty 0.4

## 2020-09-24 MED ORDER — LORAZEPAM 2 MG/ML IJ SOLN: 0.0000 mg | Freq: Three times a day (TID) | INTRAMUSCULAR | Status: DC

## 2020-09-24 MED ORDER — ADULT MULTIVITAMIN W/MINERALS CH
1.0000 | ORAL_TABLET | Freq: Every day | ORAL | Status: DC
  Administered 2020-09-24 – 2020-09-27 (×4): 1 via ORAL
  Filled 2020-09-24 (×4): qty 1

## 2020-09-24 MED ORDER — MAGNESIUM SULFATE 2 GM/50ML IV SOLN
2.0000 g | Freq: Once | INTRAVENOUS | Status: AC
  Administered 2020-09-24: 2 g via INTRAVENOUS
  Filled 2020-09-24: qty 50

## 2020-09-24 MED ORDER — ACETAMINOPHEN 650 MG RE SUPP: 650.0000 mg | Freq: Four times a day (QID) | RECTAL | Status: DC | PRN

## 2020-09-24 MED ORDER — SODIUM CHLORIDE (PF) 0.9 % IJ SOLN
INTRAMUSCULAR | Status: AC
  Filled 2020-09-24: qty 50

## 2020-09-24 MED ORDER — SODIUM CHLORIDE 0.9 % IV SOLN: 500.0000 mg | INTRAVENOUS | Status: DC

## 2020-09-24 MED ORDER — HYDROCODONE-ACETAMINOPHEN 5-325 MG PO TABS
1.0000 | ORAL_TABLET | Freq: Once | ORAL | Status: AC
  Administered 2020-09-24: 1 via ORAL
  Filled 2020-09-24: qty 1

## 2020-09-24 MED ORDER — LORAZEPAM 2 MG/ML IJ SOLN: 1.0000 mg | Freq: Once | INTRAMUSCULAR | Status: DC

## 2020-09-24 MED ORDER — IOHEXOL 350 MG/ML SOLN
100.0000 mL | Freq: Once | INTRAVENOUS | Status: AC | PRN
  Administered 2020-09-24: 100 mL via INTRAVENOUS

## 2020-09-24 MED ORDER — ALBUTEROL SULFATE HFA 108 (90 BASE) MCG/ACT IN AERS: 2.0000 | INHALATION_SPRAY | RESPIRATORY_TRACT | Status: DC | PRN

## 2020-09-24 MED ORDER — THIAMINE HCL 100 MG PO TABS
100.0000 mg | ORAL_TABLET | Freq: Every day | ORAL | Status: DC
  Administered 2020-09-24 – 2020-09-27 (×4): 100 mg via ORAL
  Filled 2020-09-24 (×4): qty 1

## 2020-09-24 MED ORDER — SODIUM CHLORIDE 0.9 % IV SOLN
100.0000 mg | Freq: Two times a day (BID) | INTRAVENOUS | Status: DC
  Administered 2020-09-24 (×2): 100 mg via INTRAVENOUS
  Filled 2020-09-24 (×2): qty 100

## 2020-09-24 MED ORDER — SODIUM CHLORIDE 0.9 % IV SOLN
2.0000 g | INTRAVENOUS | Status: DC
  Administered 2020-09-24 – 2020-09-26 (×3): 2 g via INTRAVENOUS
  Filled 2020-09-24 (×3): qty 20
  Filled 2020-09-24: qty 2

## 2020-09-24 MED ORDER — ACETAMINOPHEN 325 MG PO TABS
650.0000 mg | ORAL_TABLET | Freq: Four times a day (QID) | ORAL | Status: DC | PRN
  Administered 2020-09-25 (×2): 650 mg via ORAL
  Filled 2020-09-24 (×2): qty 2

## 2020-09-24 MED ORDER — LORAZEPAM 2 MG/ML IJ SOLN
0.0000 mg | INTRAMUSCULAR | Status: DC
  Administered 2020-09-24 (×2): 2 mg via INTRAVENOUS
  Administered 2020-09-25: 4 mg via INTRAVENOUS
  Filled 2020-09-24: qty 2
  Filled 2020-09-24 (×2): qty 1
  Filled 2020-09-24: qty 2

## 2020-09-24 NOTE — ED Provider Notes (Signed)
Patient signed out pending work-up including CT imaging of the chest and abdomen.  In brief was taken to the [facility] detention center.  Reports that she has had shortness of breath and chest pain.  Was noted on chest x-ray to have a pneumonia.  Was given Rocephin and doxycycline.  Other lab work-up was largely reassuring.  She was noted to be intoxicated upon arrival.  She had an episode earlier this morning concerning for possible seizure-like activity.  However this was not witnessed by nursing but by the officer at the bedside.  Seizure precautions were taken.  Given her history, she is certainly at risk for alcohol withdrawal seizures.  She was placed on CIWA protocol with initial CIWA score of 9.  On my evaluation, she is very tremulous and tachycardic.  She will be given Ativan per CIWA protocol.  Given pneumonia and high risk for DTs, will admit for ongoing management.  CT reviewed and shows no evidence of PE but does redemonstrate likely pneumonia.  Physical Exam  BP (!) 158/109   Pulse (!) 111   Temp 99.3 F (37.4 C) (Oral)   Resp 19   Ht 1.549 m (5\' 1" )   Wt 49.9 kg   LMP 09/16/2020 Comment: negative HCG quantitative 09/23/20  SpO2 94%   BMI 20.78 kg/m   Physical Exam  ED Course/Procedures   Clinical Course as of Sep 25 347  Tue Sep 24, 2020  0302 Patient reported by nursing to have a seizure-like episode witnessed by jail staff at the bedside.  Upon nursing evaluation, patient did not appear postictal.  Was no longer "seizing."  CT reviewed.  Shows a pneumonia.  She previously received Rocephin and doxycycline.  No PE.  She was intoxicated upon arrival 125.  Reported long history of alcohol abuse.  Will monitor for signs of withdrawal.  She remains slightly tachycardic.  Will give a dose of Ativan.   [CH]    Clinical Course User Index [CH] Jakyia Gaccione, Barbette Hair, MD    .Critical Care Performed by: Merryl Hacker, MD Authorized by: Merryl Hacker, MD   Critical  care provider statement:    Critical care time (minutes):  45   Critical care was time spent personally by me on the following activities:  Discussions with consultants, evaluation of patient's response to treatment, examination of patient, ordering and performing treatments and interventions, ordering and review of laboratory studies, ordering and review of radiographic studies, pulse oximetry, re-evaluation of patient's condition, obtaining history from patient or surrogate and review of old charts    MDM    Problem List Items Addressed This Visit    None    Visit Diagnoses    Community acquired pneumonia, unspecified laterality    -  Primary   Relevant Medications   albuterol (VENTOLIN HFA) 108 (90 Base) MCG/ACT inhaler 2 puff (Completed)   cefTRIAXone (ROCEPHIN) 1 g in sodium chloride 0.9 % 100 mL IVPB (Completed)   doxycycline (VIBRA-TABS) tablet 100 mg (Completed)   Alcohol withdrawal seizure without complication (HCC)              Merryl Hacker, MD 09/24/20 [phone]

## 2020-09-24 NOTE — TOC Initial Note (Signed)
Transition of Care [facility]) - Initial/Assessment Note    Patient Details  Name: Jessica Case MRN: [id number] Date of Birth: [birth year]  [facility] CM/SW Contact:    Leeroy Cha, RN Phone Number: 09/24/2020, 8:17 AM  Clinical Narrative:                 39 y.o. female with medical history significant for asthma, schizophrenia, alcohol dependence, and hypertension, now presenting to the emergency department for evaluation of pleuritic chest pain and productive cough.  Patient reports that she developed left-sided chest pain that is worse with cough or deep breath over the past 1 or 2 days, has also developed some shortness of breath, was worsening yesterday, and came into the ED for evaluation.  Reports that she has not taken any prescription medications in at least a couple months.  She reports drinking four 40 ounce beers every day, but has been detained at a detention facility for the past day where she has not had any alcohol.  She reports history of a seizure once before in the setting of not drinking for a couple days.  She denies any fall or head trauma.  ED Course: Upon arrival to the ED, patient is found to be afebrile, saturating 91% on room air, tachycardic to the 120s, and hypertensive.  EKG features sinus tachycardia with prolonged QT interval.  Chest x-ray is concerning for left lower lobe pneumonia.  High-sensitivity troponin is normal x2.  CTA chest is negative for PE but concerning for pneumonia.  Chemistry panel notable for AST 144, ALT 56, and total bilirubin 1.8.  CBC features a mild leukocytosis and thrombocytopenia.  COVID-19 PCR is negative.  Patient was given a liter of saline, acetaminophen, albuterol, Rocephin, doxycycline, and Maalox in the ED.  She developed generalized seizure-like activity and was treated with Ativan. PLAN: to return to the [facility] for progression. Expected Discharge Plan: [facility] Barriers to  Discharge: No Barriers Identified   Patient Goals and CMS Choice Patient states their goals for this hospitalization and ongoing recovery are:: not states      Expected Discharge Plan and Services Expected Discharge Plan: [place]   Discharge Planning Services: CM Consult   Living arrangements for the past 2 months: [place]                                      Prior Living Arrangements/Services Living arrangements for the past 2 months: Artist Lives with:: Self Patient language and need for interpreter reviewed:: Yes Do you feel safe going back to the place where you live?: Yes      Need for Family Participation in Patient Care: No (Comment) (no family present) Care giver support system in place?: No (comment) (no family present or listed)   Criminal Activity/Legal Involvement Pertinent to Current Situation/Hospitalization: Yes - Comment as needed  Activities of Daily Living      Permission Sought/Granted                  Emotional Assessment Appearance:: Appears stated age Attitude/Demeanor/Rapport: Uncooperative Affect (typically observed): Unable to Assess Orientation: : Oriented to Self, Oriented to Place, Oriented to  Time, Oriented to Situation Alcohol / Substance Use: Alcohol Use Psych Involvement: No (comment)  Admission diagnosis:  Alcohol withdrawal seizure ([place]) [H41.740, R56.9] Alcohol withdrawal seizure without complication ([place]) [C14.481, R56.9] Community acquired pneumonia, unspecified  laterality [J18.9] Patient Active Problem List   Diagnosis Date Noted  . Alcohol withdrawal seizure ([place]) 09/24/2020  . CAP (community acquired pneumonia) 09/24/2020  . Alcohol dependence ([place]) 09/24/2020  . Elevated LFTs 09/24/2020  . Asthma 04/15/2020  . Paranoid schizophrenia ([place]) 10/31/2017  . Right foot pain 05/11/2017  . Essential hypertension 10/08/2016  . Cocaine substance abuse ([place]) 10/08/2016  .  Fibroids, subserous 08/18/2016  . Hydrosalpinx 04/13/2016  . Tobacco dependency 09/04/2015  . Migraine with status migrainosus 08/15/2015  . Allergic rhinitis 07/18/2015  . Pain due to dental caries 07/12/2014   PCP:  [name], NP Pharmacy:   [facility], [place] - [address] [place] [address] Phone: [phone] Fax: [phone]     Social Determinants of Health (SDOH) Interventions    Readmission Risk Interventions No flowsheet data found.

## 2020-09-24 NOTE — ED Notes (Signed)
ED TO INPATIENT HANDOFF REPORT  Name/Age/Gender Jessica Case 39 y.o. female  Code Status    Code Status Orders  (From admission, onward)         Start     Ordered   09/24/20 0427  Full code  Continuous        09/24/20 0428        Code Status History    Date Active Date Inactive Code Status Order ID Comments User Context   09/24/2020 0337 09/24/2020 0428 Full Code [id number]  Merryl Hacker, MD ED   04/16/2020 0149 04/17/2020 1157 Full Code [id number]  Harvie Bridge, [place] ED   06/30/2014 0924 07/03/2014 1802 Full Code [id number]  Osborne Oman, MD Inpatient   06/30/2014 0859 06/30/2014 0924 Full Code [id number]  Rasch, Darrelyn Hillock, NP Inpatient   Advance Care Planning Activity      Home/SNF/Other [facility]  Chief Complaint Alcohol withdrawal seizure ([place]) [K35.465, R56.9]  Level of Care/Admitting Diagnosis ED Disposition    ED Disposition Condition [place]: [facility] [[id number]]  Level of Care: Stepdown [14]  Admit to SDU based on following criteria: Severe physiological/psychological symptoms:  Any diagnosis requiring assessment & intervention at least every 4 hours on an ongoing basis to obtain desired patient outcomes including stability and rehabilitation  May admit patient to [facility] or [facility] if equivalent level of care is available:: Yes  Covid Evaluation: Confirmed COVID Negative  Diagnosis: Alcohol withdrawal seizure [facility]) [[id number]]  Admitting Physician: Vianne Bulls [[id number]]  Attending Physician: Vianne Bulls [[id number]]  Estimated length of stay: past midnight tomorrow  Certification:: I certify this patient will need inpatient services for at least 2 midnights       Medical History Past Medical History:  Diagnosis Date  . Arthritis   . Claustrophobia   . Cold    currently - no meds  . Fallopian tube abscess   . Fibroids   . Paranoid schizophrenia ([place])   . SVD (spontaneous vaginal  delivery)    x 1    Allergies Allergies  Allergen Reactions  . Citrus Hives and Itching  . Food Hives, Itching and Other (See Comments)    Pt confirmed that it is not an anaphylactic reaction  . Strawberry Flavor     Hives all over body.   . Toradol [Ketorolac Tromethamine] Hives and Itching    IV Location/Drains/Wounds Patient Lines/Drains/Airways Status    Active Line/Drains/Airways    Name Placement date Placement time Site Days   Peripheral IV 09/23/20 Right Antecubital 09/23/20  2317  Antecubital  1   Incision (Closed) 08/18/16 Abdomen Other (Comment) 08/18/16  1309   1498   Incision (Closed) 08/18/16 Perineum Other (Comment) 08/18/16  1309   1498   Incision - 3 Ports Abdomen 1: Umbilicus 2: Right;Lower 3: Left;Lower 08/18/16  --   1498          Labs/Imaging Results for orders placed or performed during the hospital encounter of 09/23/20 (from the past 48 hour(s))  Comprehensive metabolic panel     Status: Abnormal   Collection Time: 09/23/20 10:04 PM  Result Value Ref Range   Sodium 134 (L) 135 - 145 mmol/L   Potassium 4.1 3.5 - 5.1 mmol/L   Chloride 97 (L) 98 - 111 mmol/L   CO2 20 (L) 22 - 32 mmol/L   Glucose, Bld 109 (H) 70 - 99 mg/dL    Comment: Glucose reference range applies only  to samples taken after fasting for at least 8 hours.   BUN <5 (L) 6 - 20 mg/dL   Creatinine, Ser 0.53 0.44 - 1.00 mg/dL   Calcium 8.3 (L) 8.9 - 10.3 mg/dL   Total Protein 7.2 6.5 - 8.1 g/dL   Albumin 3.8 3.5 - 5.0 g/dL   AST 144 (H) 15 - 41 U/L   ALT 56 (H) 0 - 44 U/L   Alkaline Phosphatase 137 (H) 38 - 126 U/L   Total Bilirubin 1.8 (H) 0.3 - 1.2 mg/dL   GFR, Estimated >60 >60 mL/min    Comment: (NOTE) Calculated using the CKD-EPI Creatinine Equation (2021)    Anion gap 17 (H) 5 - 15    Comment: Performed at [facility], [place] [address], [place], [place] [address]  Ethanol     Status: Abnormal   Collection Time: 09/23/20 10:04 PM  Result Value Ref Range    Alcohol, Ethyl (B) 125 (H) <10 mg/dL    Comment: (NOTE) Lowest detectable limit for serum alcohol is 10 mg/dL.  For medical purposes only. Performed at [facility], [place] [address] [place], [place] [address]   CBC with Differential     Status: Abnormal   Collection Time: 09/23/20 10:04 PM  Result Value Ref Range   WBC 10.7 (H) 4.0 - 10.5 K/uL   RBC 3.66 (L) 3.87 - 5.11 MIL/uL   Hemoglobin 12.1 12.0 - 15.0 g/dL   HCT 34.8 (L) 36 - 46 %   MCV 95.1 80.0 - 100.0 fL   MCH 33.1 26.0 - 34.0 pg   MCHC 34.8 30.0 - 36.0 g/dL   RDW 14.8 11.5 - 15.5 %   Platelets 147 (L) 150 - 400 K/uL    Comment: REPEATED TO VERIFY CONSISTENT WITH PREVIOUS RESULT    nRBC 0.0 0.0 - 0.2 %   Neutrophils Relative % 86 %   Neutro Abs 9.2 (H) 1.7 - 7.7 K/uL   Lymphocytes Relative 6 %   Lymphs Abs 0.6 (L) 0.7 - 4.0 K/uL   Monocytes Relative 6 %   Monocytes Absolute 0.7 0.1 - 1.0 K/uL   Eosinophils Relative 0 %   Eosinophils Absolute 0.0 0.0 - 0.5 K/uL   Basophils Relative 1 %   Basophils Absolute 0.1 0.0 - 0.1 K/uL   WBC Morphology VACUOLATED NEUTROPHILS    Immature Granulocytes 1 %   Abs Immature Granulocytes 0.09 (H) 0.00 - 0.07 K/uL    Comment: Performed at [facility], [place] [address] [place], [place] [address]  Lipase, blood     Status: None   Collection Time: 09/23/20 10:04 PM  Result Value Ref Range   Lipase 30 11 - 51 U/L    Comment: Performed at [facility], [place] [address] [place], [place] [address]  Troponin I (High Sensitivity)     Status: None   Collection Time: 09/23/20 10:04 PM  Result Value Ref Range   Troponin I (High Sensitivity) 7 <18 ng/L    Comment: (NOTE) Elevated high sensitivity troponin I (hsTnI) values and significant  changes across serial measurements may suggest ACS but many other  chronic and acute conditions are known to elevate hsTnI results.  Refer to the Links section for chest pain algorithms and additional   guidance. Performed at [facility], [place] [address], [place], [place] [address]   D-dimer, quantitative     Status: Abnormal   Collection Time: 09/23/20 10:04 PM  Result Value  Ref Range   D-Dimer, Quant 0.54 (H) 0.00 - 0.50 ug/mL-FEU    Comment: (NOTE) At the manufacturer cut-off value of 0.5 g/mL FEU, this assay has a negative predictive value of 95-100%.This assay is intended for use in conjunction with a clinical pretest probability (PTP) assessment model to exclude pulmonary embolism (PE) and deep venous thrombosis (DVT) in outpatients suspected of PE or DVT. Results should be correlated with clinical presentation. Performed at [facility], [place] [address] [place], [place] [address]   Blood gas, venous     Status: Abnormal   Collection Time: 09/23/20 10:05 PM  Result Value Ref Range   pH, Ven 7.424 7.25 - 7.43   pCO2, Ven 37.9 (L) 44 - 60 mmHg   pO2, Ven 32.2 32 - 45 mmHg   Bicarbonate 24.4 20.0 - 28.0 mmol/L   Acid-Base Excess 0.6 0.0 - 2.0 mmol/L   O2 Saturation 47.8 %   Patient temperature 98.6     Comment: Performed at [facility], [place] [address], [place], [place] [address]  Resp Panel by RT-PCR (Flu A&B, Covid) Nasopharyngeal Swab     Status: None   Collection Time: 09/23/20 10:58 PM   Specimen: Nasopharyngeal Swab; Nasopharyngeal(NP) swabs in vial transport medium  Result Value Ref Range   SARS Coronavirus 2 by RT PCR NEGATIVE NEGATIVE    Comment: (NOTE) SARS-CoV-2 target nucleic acids are NOT DETECTED.  The SARS-CoV-2 RNA is generally detectable in upper respiratory specimens during the acute phase of infection. The lowest concentration of SARS-CoV-2 viral copies this assay can detect is 138 copies/mL. A negative result does not preclude SARS-Cov-2 infection and should not be used as the sole basis for treatment or other patient management decisions. A negative result may occur with  improper specimen  collection/handling, submission of specimen other than nasopharyngeal swab, presence of viral mutation(s) within the areas targeted by this assay, and inadequate number of viral copies(<138 copies/mL). A negative result must be combined with clinical observations, patient history, and epidemiological information. The expected result is Negative.  Fact Sheet for Patients:  EntrepreneurPulse.com.au  Fact Sheet for Healthcare Providers:  IncredibleEmployment.be  This test is no t yet approved or cleared by the [place] FDA and  has been authorized for detection and/or diagnosis of SARS-CoV-2 by FDA under an Emergency Use Authorization (EUA). This EUA will remain  in effect (meaning this test can be used) for the duration of the COVID-19 declaration under Section 564(b)(1) of the Act, 21 U.S.C.section 360bbb-3(b)(1), unless the authorization is terminated  or revoked sooner.       Influenza A by PCR NEGATIVE NEGATIVE   Influenza B by PCR NEGATIVE NEGATIVE    Comment: (NOTE) The Xpert Xpress SARS-CoV-2/FLU/RSV plus assay is intended as an aid in the diagnosis of influenza from Nasopharyngeal swab specimens and should not be used as a sole basis for treatment. Nasal washings and aspirates are unacceptable for Xpert Xpress SARS-CoV-2/FLU/RSV testing.  Fact Sheet for Patients: EntrepreneurPulse.com.au  Fact Sheet for Healthcare Providers: IncredibleEmployment.be  This test is not yet approved or cleared by the [place] FDA and has been authorized for detection and/or diagnosis of SARS-CoV-2 by FDA under an Emergency Use Authorization (EUA). This EUA will remain in effect (meaning this test can be used) for the duration of the COVID-19 declaration under Section 564(b)(1) of the Act, 21 U.S.C. section 360bbb-3(b)(1), unless the authorization is terminated or revoked.  Performed at [facility], [place] [address] [place], [place] [address]   hCG, quantitative, pregnancy     Status: None   Collection Time: 09/23/20 11:09 PM  Result Value Ref Range   hCG, Beta Chain, Quant, S 1 <5 mIU/mL    Comment:          GEST. AGE      CONC.  (mIU/mL)   <=1 WEEK        5 - 50     2 WEEKS       50 - 500     3 WEEKS       100 - 10,000     4 WEEKS     1,000 - 30,000     5 WEEKS     3,500 - 115,000   6-8 WEEKS     12,000 - 270,000    12 WEEKS     15,000 - 220,000        FEMALE AND NON-PREGNANT FEMALE:     LESS THAN 5 mIU/mL Performed at [facility], [place] [address], [place], [place] [address]   Troponin I (High Sensitivity)     Status: None   Collection Time: 09/24/20 12:05 AM  Result Value Ref Range   Troponin I (High Sensitivity) 7 <18 ng/L    Comment: (NOTE) Elevated high sensitivity troponin I (hsTnI) values and significant  changes across serial measurements may suggest ACS but many other  chronic and acute conditions are known to elevate hsTnI results.  Refer to the "Links" section for chest pain algorithms and additional  guidance. Performed at [facility], [place] [address] [place], [place] [address]   Comprehensive metabolic panel     Status: Abnormal   Collection Time: 09/24/20  5:27 AM  Result Value Ref Range   Sodium 137 135 - 145 mmol/L   Potassium 3.2 (L) 3.5 - 5.1 mmol/L    Comment: DELTA CHECK NOTED   Chloride 98 98 - 111 mmol/L   CO2 24 22 - 32 mmol/L   Glucose, Bld 125 (H) 70 - 99 mg/dL    Comment: Glucose reference range applies only to samples taken after fasting for at least 8 hours.   BUN <5 (L) 6 - 20 mg/dL   Creatinine, Ser 0.60 0.44 - 1.00 mg/dL   Calcium 8.2 (L) 8.9 - 10.3 mg/dL   Total Protein 6.6 6.5 - 8.1 g/dL   Albumin 3.5 3.5 - 5.0 g/dL   AST 116 (H) 15 - 41 U/L   ALT 54 (H) 0 - 44 U/L   Alkaline Phosphatase 121 38 - 126 U/L   Total Bilirubin 1.9 (H) 0.3 - 1.2 mg/dL   GFR, Estimated >60 >60 mL/min     Comment: (NOTE) Calculated using the CKD-EPI Creatinine Equation (2021)    Anion gap 15 5 - 15    Comment: Performed at [facility], [place] [address] [place], [place] [address]  Magnesium     Status: Abnormal   Collection Time: 09/24/20  5:27 AM  Result Value Ref Range   Magnesium 1.1 (L) 1.7 - 2.4 mg/dL    Comment: Performed at [facility], [place] [address] [place], [place] [address]  Phosphorus     Status: None   Collection Time: 09/24/20  5:27 AM  Result Value Ref Range   Phosphorus 2.9 2.5 - 4.6 mg/dL    Comment: Performed at [facility], [place] [address] [place],  [address]  CBC  Status: Abnormal   Collection Time: 09/24/20  5:27 AM  Result Value Ref Range   WBC 11.5 (H) 4.0 - 10.5 K/uL   RBC 3.34 (L) 3.87 - 5.11 MIL/uL   Hemoglobin 11.2 (L) 12.0 - 15.0 g/dL   HCT 31.4 (L) 36 - 46 %   MCV 94.0 80.0 - 100.0 fL   MCH 33.5 26.0 - 34.0 pg   MCHC 35.7 30.0 - 36.0 g/dL   RDW 14.6 11.5 - 15.5 %   Platelets 135 (L) 150 - 400 K/uL    Comment: REPEATED TO VERIFY CONSISTENT WITH PREVIOUS RESULT    nRBC 0.0 0.0 - 0.2 %    Comment: Performed at [facility], [place] [address], [place], [place] [address]   CT Angio Chest PE W/Cm &/Or Wo Cm  Result Date: 09/24/2020 CLINICAL DATA:  Left-sided chest pain EXAM: CT ANGIOGRAPHY CHEST WITH CONTRAST TECHNIQUE: Multidetector CT imaging of the chest was performed using the standard protocol during bolus administration of intravenous contrast. Multiplanar CT image reconstructions and MIPs were obtained to evaluate the vascular anatomy. CONTRAST:  111mL OMNIPAQUE IOHEXOL 350 MG/ML SOLN COMPARISON:  None. FINDINGS: Cardiovascular: There is a optimal opacification of the pulmonary arteries. There is no central,segmental, or subsegmental filling defects within the pulmonary arteries. The heart is normal in size. No pericardial effusion or thickening. No evidence right  heart strain. There is normal three-vessel brachiocephalic anatomy without proximal stenosis. The thoracic aorta is normal in appearance. Mediastinum/Nodes: No hilar, mediastinal, or axillary adenopathy. Thyroid gland, trachea, and esophagus demonstrate no significant findings. Lungs/Pleura: Large area of airspace consolidation seen at the posterior left lung base with air bronchograms. Streaky airspace opacity at the right lung base. No pleural effusion. Upper Abdomen: No acute abnormalities present in the visualized portions of the upper abdomen. Musculoskeletal: No chest wall abnormality. No acute or significant osseous findings. Review of the MIP images confirms the above findings. Abdomen/pelvis: Hepatobiliary: There is diffuse low density seen throughout the liver parenchyma. No focal hepatic lesion is seen. No intra or extrahepatic biliary ductal dilatation. The main portal vein is patent. No evidence of calcified gallstones, gallbladder wall thickening or biliary dilatation. Pancreas: Unremarkable. No pancreatic ductal dilatation or surrounding inflammatory changes. Spleen: Normal in size without focal abnormality. Adrenals/Urinary Tract: Both adrenal glands appear normal. The kidneys and collecting system appear normal without evidence of urinary tract calculus or hydronephrosis. Bladder is unremarkable. Stomach/Bowel: The stomach, small bowel, and colon are normal in appearance. No inflammatory changes, wall thickening, or obstructive findings.The appendix is normal. Vascular/Lymphatic: There are no enlarged mesenteric, retroperitoneal, or pelvic lymph nodes. No significant vascular findings are present. Reproductive: Again noted is a partially exophytic right uterine fibroid. A trace amount of free fluid seen within the cul-de-sac. Other: No evidence of abdominal wall mass or hernia. Musculoskeletal: No acute or significant osseous findings. IMPRESSION: No central, segmental, or subsegmental pulmonary  embolism. Large area of airspace consolidation in the posterior lower lobe likely consistent with pneumonia. Hepatic steatosis Uterine fibroid Electronically Signed   By: Prudencio Pair M.D.   On: 09/24/2020 02:13   CT Abdomen Pelvis W Contrast  Result Date: 09/24/2020 CLINICAL DATA:  Left-sided chest pain EXAM: CT ANGIOGRAPHY CHEST WITH CONTRAST TECHNIQUE: Multidetector CT imaging of the chest was performed using the standard protocol during bolus administration of intravenous contrast. Multiplanar CT image reconstructions and MIPs were obtained to evaluate the vascular anatomy. CONTRAST:  160mL OMNIPAQUE IOHEXOL 350 MG/ML SOLN COMPARISON:  None. FINDINGS: Cardiovascular: There  is a optimal opacification of the pulmonary arteries. There is no central,segmental, or subsegmental filling defects within the pulmonary arteries. The heart is normal in size. No pericardial effusion or thickening. No evidence right heart strain. There is normal three-vessel brachiocephalic anatomy without proximal stenosis. The thoracic aorta is normal in appearance. Mediastinum/Nodes: No hilar, mediastinal, or axillary adenopathy. Thyroid gland, trachea, and esophagus demonstrate no significant findings. Lungs/Pleura: Large area of airspace consolidation seen at the posterior left lung base with air bronchograms. Streaky airspace opacity at the right lung base. No pleural effusion. Upper Abdomen: No acute abnormalities present in the visualized portions of the upper abdomen. Musculoskeletal: No chest wall abnormality. No acute or significant osseous findings. Review of the MIP images confirms the above findings. Abdomen/pelvis: Hepatobiliary: There is diffuse low density seen throughout the liver parenchyma. No focal hepatic lesion is seen. No intra or extrahepatic biliary ductal dilatation. The main portal vein is patent. No evidence of calcified gallstones, gallbladder wall thickening or biliary dilatation. Pancreas: Unremarkable. No  pancreatic ductal dilatation or surrounding inflammatory changes. Spleen: Normal in size without focal abnormality. Adrenals/Urinary Tract: Both adrenal glands appear normal. The kidneys and collecting system appear normal without evidence of urinary tract calculus or hydronephrosis. Bladder is unremarkable. Stomach/Bowel: The stomach, small bowel, and colon are normal in appearance. No inflammatory changes, wall thickening, or obstructive findings.The appendix is normal. Vascular/Lymphatic: There are no enlarged mesenteric, retroperitoneal, or pelvic lymph nodes. No significant vascular findings are present. Reproductive: Again noted is a partially exophytic right uterine fibroid. A trace amount of free fluid seen within the cul-de-sac. Other: No evidence of abdominal wall mass or hernia. Musculoskeletal: No acute or significant osseous findings. IMPRESSION: No central, segmental, or subsegmental pulmonary embolism. Large area of airspace consolidation in the posterior lower lobe likely consistent with pneumonia. Hepatic steatosis Uterine fibroid Electronically Signed   By: Prudencio Pair M.D.   On: 09/24/2020 02:13   DG Chest Port 1 View  Result Date: 09/23/2020 CLINICAL DATA:  Chest pain EXAM: PORTABLE CHEST 1 VIEW COMPARISON:  04/15/2020 FINDINGS: Right lung is clear. Airspace disease in the left lower lobe. Normal cardiomediastinal silhouette. No pneumothorax. IMPRESSION: Left lower lobe pneumonia. Electronically Signed   By: Donavan Foil M.D.   On: 09/23/2020 22:44    Pending Labs Unresulted Labs (From admission, onward)          Start     Ordered   10/01/20 0500  Creatinine, serum  (enoxaparin (LOVENOX)    CrCl >/= 30 ml/min)  Weekly,   R     Comments: while on enoxaparin therapy    09/24/20 0428   09/24/20 0500  Comprehensive metabolic panel  Daily,   R      09/24/20 0428   09/24/20 0500  CBC  Daily,   R      09/24/20 0428   09/24/20 0500  Procalcitonin  Daily,   R      09/24/20 0428    09/24/20 0426  HIV Antibody (routine testing w rflx)  (HIV Antibody (Routine testing w reflex) panel)  Once,   STAT        09/24/20 0428   09/24/20 0426  Culture, sputum-assessment  Once,   R        09/24/20 0428   09/24/20 0426  Legionella Pneumophila Serogp 1 Ur Ag  Once,   STAT        09/24/20 0428   09/24/20 0426  Strep pneumoniae urinary antigen  Once,   STAT  09/24/20 0428   09/23/20 2205  Urine rapid drug screen (hosp performed)  ONCE - STAT,   STAT        09/23/20 2205   09/23/20 2205  Urinalysis, Routine w reflex microscopic  ONCE - STAT,   STAT        09/23/20 2205          Vitals/Pain Today's Vitals   09/24/20 0400 09/24/20 0430 09/24/20 0500 09/24/20 0521  BP: 139/81 (!) 149/100 (!) 147/103 (!) 145/114  Pulse: (!) 110 (!) 122 (!) 109 (!) 111  Resp: (!) 24 17 15    Temp:      TempSrc:      SpO2: 94% 95% 97%   Weight:      Height:      PainSc:        Isolation Precautions No active isolations  Medications Medications  sodium chloride (PF) 0.9 % injection (has no administration in time range)  LORazepam (ATIVAN) tablet 1-4 mg (has no administration in time range)    Or  LORazepam (ATIVAN) injection 1-4 mg (has no administration in time range)  thiamine tablet 100 mg (has no administration in time range)    Or  thiamine (B-1) injection 100 mg (has no administration in time range)  folic acid (FOLVITE) tablet 1 mg (has no administration in time range)  multivitamin with minerals tablet 1 tablet (has no administration in time range)  cefTRIAXone (ROCEPHIN) 2 g in sodium chloride 0.9 % 100 mL IVPB (has no administration in time range)  LORazepam (ATIVAN) injection 0-4 mg (0 mg Intravenous Not Given 09/24/20 0523)    Followed by  LORazepam (ATIVAN) injection 0-4 mg (has no administration in time range)  0.9 %  sodium chloride infusion ( Intravenous New Bag/Given 09/24/20 0532)  enoxaparin (LOVENOX) injection 40 mg (has no administration in time range)  sodium  chloride flush (NS) 0.9 % injection 3 mL (has no administration in time range)  acetaminophen (TYLENOL) tablet 650 mg (has no administration in time range)    Or  acetaminophen (TYLENOL) suppository 650 mg (has no administration in time range)  senna-docusate (Senokot-S) tablet 1 tablet (has no administration in time range)  albuterol (VENTOLIN HFA) 108 (90 Base) MCG/ACT inhaler 2 puff (has no administration in time range)  labetalol (NORMODYNE) injection 10 mg (has no administration in time range)  doxycycline (VIBRAMYCIN) 100 mg in sodium chloride 0.9 % 250 mL IVPB (has no administration in time range)  sodium chloride 0.9 % bolus 1,000 mL (0 mLs Intravenous Stopped 09/24/20 0020)  albuterol (VENTOLIN HFA) 108 (90 Base) MCG/ACT inhaler 2 puff (2 puffs Inhalation Given 09/23/20 2258)  acetaminophen (TYLENOL) tablet 650 mg (650 mg Oral Given 09/23/20 2315)  alum & mag hydroxide-simeth (MAALOX/MYLANTA) 200-200-20 MG/5ML suspension 30 mL (30 mLs Oral Given 09/24/20 0019)  cefTRIAXone (ROCEPHIN) 1 g in sodium chloride 0.9 % 100 mL IVPB (0 g Intravenous Stopped 09/24/20 0134)  doxycycline (VIBRA-TABS) tablet 100 mg (100 mg Oral Given 09/24/20 0032)  iohexol (OMNIPAQUE) 350 MG/ML injection 100 mL (100 mLs Intravenous Contrast Given 09/24/20 0138)  HYDROcodone-acetaminophen (NORCO/VICODIN) 5-325 MG per tablet 1 tablet (1 tablet Oral Given 09/24/20 0133)  LORazepam (ATIVAN) injection 1 mg (1 mg Intravenous Given 09/24/20 0333)    Mobility walks

## 2020-09-24 NOTE — Progress Notes (Signed)
Patient ID: Jessica Case, female   DOB: 1980/11/24, 39 y.o.   MRN: [id number] Patient admitted early this morning for chest pain with productive cough and developed generalized seizure-like activity in the ED which is treated with Ativan.  She was started on antibiotics for pneumonia as well as CIWA protocol.  Patient seen and examined at bedside.  She is drowsy, answers some questions.  No seizures since admission to the unit.  Will monitor mental status.  I have reviewed patient medical records including this morning's H&P, current vitals, medications and labs myself.  Continue antibiotics and CIWA protocol.  Repeat a.m. labs.

## 2020-09-24 NOTE — H&P (Signed)
History and Physical    Jessica Case YHC:[id number] DOB: 14-Sep-1981 DOA: 09/23/2020  PCP: [name], NP   Patient coming from: Detention facility   Chief Complaint: Chest pain, productive cough   HPI: Jessica Case is a 39 y.o. female with medical history significant for asthma, schizophrenia, alcohol dependence, and hypertension, now presenting to the emergency department for evaluation of pleuritic chest pain and productive cough.  Patient reports that she developed left-sided chest pain that is worse with cough or deep breath over the past 1 or 2 days, has also developed some shortness of breath, was worsening yesterday, and came into the ED for evaluation.  Reports that she has not taken any prescription medications in at least a couple months.  She reports drinking four 40 ounce beers every day, but has been detained at a detention facility for the past day where she has not had any alcohol.  She reports history of a seizure once before in the setting of not drinking for a couple days.  She denies any fall or head trauma.  ED Course: Upon arrival to the ED, patient is found to be afebrile, saturating 91% on room air, tachycardic to the 120s, and hypertensive.  EKG features sinus tachycardia with prolonged QT interval.  Chest x-ray is concerning for left lower lobe pneumonia.  High-sensitivity troponin is normal x2.  CTA chest is negative for PE but concerning for pneumonia.  Chemistry panel notable for AST 144, ALT 56, and total bilirubin 1.8.  CBC features a mild leukocytosis and thrombocytopenia.  COVID-19 PCR is negative.  Patient was given a liter of saline, acetaminophen, albuterol, Rocephin, doxycycline, and Maalox in the ED.  She developed generalized seizure-like activity and was treated with Ativan.  Review of Systems:  All other systems reviewed and apart from HPI, are negative.  Past Medical History:  Diagnosis Date  . Arthritis   . Claustrophobia   . Cold     currently - no meds  . Fallopian tube abscess   . Fibroids   . Paranoid schizophrenia ([place])   . SVD (spontaneous vaginal delivery)    x 1    Past Surgical History:  Procedure Laterality Date  . BUNIONECTOMY    . bunyonectomy Bilateral    x 1  . LAPAROSCOPIC UNILATERAL SALPINGECTOMY Left 08/18/2016   Procedure: LAPAROSCOPIC UNILATERAL SALPINGECTOMY;  Surgeon: Lavonia Drafts, MD;  Location: [facility];  Service: Gynecology;  Laterality: Left;  . LAPAROSCOPY  08/18/2016   Procedure: LAPAROSCOPY OPERATIVE;  Surgeon: Lavonia Drafts, MD;  Location: [facility];  Service: Gynecology;;  . LAPAROTOMY  08/18/2016   Procedure: LAPAROTOMY;  Surgeon: Lavonia Drafts, MD;  Location: [facility];  Service: Gynecology;;  mini laparotomy  . MYOMECTOMY  08/18/2016   Procedure: MYOMECTOMY;  Surgeon: Lavonia Drafts, MD;  Location: [facility];  Service: Gynecology;;  . TOOTH EXTRACTION      Social History:   reports that she has been smoking cigarettes. She has a 2.30 pack-year smoking history. She has never used smokeless tobacco. She reports current alcohol use. She reports previous drug use. Drugs: Marijuana and "Crack" cocaine.  Allergies  Allergen Reactions  . Citrus Hives and Itching  . Food Hives, Itching and Other (See Comments)    Pt confirmed that it is not an anaphylactic reaction  . Strawberry Flavor     Hives all over body.   . Toradol [Ketorolac Tromethamine] Hives and Itching    Family History  Problem Relation Age of Onset  . Hypertension  Mother      Prior to Admission medications   Medication Sig Start Date End Date Taking? Authorizing Provider  loratadine (CLARITIN) 10 MG tablet Take 1 tablet (10 mg total) by mouth daily. Patient not taking: Reported on 04/15/2020 01/29/20   [name], NP  olopatadine (PATANOL) 0.1 % ophthalmic solution Place 1 drop into both eyes 2 (two) times daily. For itchy eyes Patient not taking: Reported on 04/15/2020 04/04/20    Argentina Donovan, PA-C  predniSONE (DELTASONE) 20 MG tablet Take 2 tablets (40 mg total) by mouth daily with breakfast. 04/17/20   Samuella Cota, MD  Prenat-Fe Poly-Methfol-FA-DHA (VITAFOL ULTRA) 29-0.6-0.4-200 MG CAPS Take 1 tablet by mouth daily. Patient not taking: Reported on 04/15/2020 07/26/19   Constant, Vickii Chafe, MD    Physical Exam: Vitals:   09/24/20 0305 09/24/20 0400 09/24/20 0430 09/24/20 0500  BP: (!) 158/109 139/81 (!) 149/100 (!) 147/103  Pulse: (!) 111 (!) 110 (!) 122 (!) 109  Resp:  (!) 24 17 15   Temp:      TempSrc:      SpO2:  94% 95% 97%  Weight:      Height:        Constitutional: NAD, resting tremor   Eyes: PERTLA, lids and conjunctivae normal ENMT: Mucous membranes are moist. Posterior pharynx clear of any exudate or lesions.   Neck: normal, supple, no masses, no thyromegaly Respiratory: Increased WOB, no wheezing. No pallor or cyanosis.  Cardiovascular: Rate ~120 and regular. No extremity edema.  Abdomen: No distension, no tenderness, soft. Bowel sounds active.  Musculoskeletal: no clubbing / cyanosis. No joint deformity upper and lower extremities.   Skin: no significant rashes, lesions, ulcers. Warm, dry, well-perfused. Neurologic: CN 2-12 grossly intact. Sensation intact. Moving all extremities. Resting tremor.  Psychiatric: Awake, slow to respond to basic questions but ultimately oriented to person, place, and situation. Anxious. Cooperative.    Labs and Imaging on Admission: I have personally reviewed following labs and imaging studies  CBC: Recent Labs  Lab 09/23/20 2204  WBC 10.7*  NEUTROABS 9.2*  HGB 12.1  HCT 34.8*  MCV 95.1  PLT 174*   Basic Metabolic Panel: Recent Labs  Lab 09/23/20 2204  NA 134*  K 4.1  CL 97*  CO2 20*  GLUCOSE 109*  BUN <5*  CREATININE 0.53  CALCIUM 8.3*   GFR: Estimated Creatinine Clearance: 70.5 mL/min (by C-G formula based on SCr of 0.53 mg/dL). Liver Function Tests: Recent Labs  Lab 09/23/20 2204   AST 144*  ALT 56*  ALKPHOS 137*  BILITOT 1.8*  PROT 7.2  ALBUMIN 3.8   Recent Labs  Lab 09/23/20 2204  LIPASE 30   No results for input(s): AMMONIA in the last 168 hours. Coagulation Profile: No results for input(s): INR, PROTIME in the last 168 hours. Cardiac Enzymes: No results for input(s): CKTOTAL, CKMB, CKMBINDEX, TROPONINI in the last 168 hours. BNP (last 3 results) No results for input(s): PROBNP in the last 8760 hours. HbA1C: No results for input(s): HGBA1C in the last 72 hours. CBG: No results for input(s): GLUCAP in the last 168 hours. Lipid Profile: No results for input(s): CHOL, HDL, LDLCALC, TRIG, CHOLHDL, LDLDIRECT in the last 72 hours. Thyroid Function Tests: No results for input(s): TSH, T4TOTAL, FREET4, T3FREE, THYROIDAB in the last 72 hours. Anemia Panel: No results for input(s): VITAMINB12, FOLATE, FERRITIN, TIBC, IRON, RETICCTPCT in the last 72 hours. Urine analysis:    Component Value Date/Time   COLORURINE YELLOW 11/04/2017 1129  APPEARANCEUR CLEAR 11/04/2017 1129   LABSPEC <1.005 (L) 11/04/2017 1129   PHURINE 5.5 11/04/2017 1129   GLUCOSEU NEGATIVE 11/04/2017 1129   HGBUR NEGATIVE 11/04/2017 1129   BILIRUBINUR negative 04/04/2020 1457   BILIRUBINUR negative 04/16/2015 1235   KETONESUR negative 04/04/2020 [place] 11/04/2017 1129   PROTEINUR NEGATIVE 11/04/2017 1129   UROBILINOGEN 0.2 04/04/2020 1457   UROBILINOGEN 0.2 08/26/2016 1611   NITRITE Negative 04/04/2020 1457   NITRITE NEGATIVE 11/04/2017 1129   LEUKOCYTESUR Negative 04/04/2020 1457   Sepsis Labs: @LABRCNTIP (procalcitonin:4,lacticidven:4) ) Recent Results (from the past 240 hour(s))  Resp Panel by RT-PCR (Flu A&B, Covid) Nasopharyngeal Swab     Status: None   Collection Time: 09/23/20 10:58 PM   Specimen: Nasopharyngeal Swab; Nasopharyngeal(NP) swabs in vial transport medium  Result Value Ref Range Status   SARS Coronavirus 2 by RT PCR NEGATIVE NEGATIVE Final     Comment: (NOTE) SARS-CoV-2 target nucleic acids are NOT DETECTED.  The SARS-CoV-2 RNA is generally detectable in upper respiratory specimens during the acute phase of infection. The lowest concentration of SARS-CoV-2 viral copies this assay can detect is 138 copies/mL. A negative result does not preclude SARS-Cov-2 infection and should not be used as the sole basis for treatment or other patient management decisions. A negative result may occur with  improper specimen collection/handling, submission of specimen other than nasopharyngeal swab, presence of viral mutation(s) within the areas targeted by this assay, and inadequate number of viral copies(<138 copies/mL). A negative result must be combined with clinical observations, patient history, and epidemiological information. The expected result is Negative.  Fact Sheet for Patients:  EntrepreneurPulse.com.au  Fact Sheet for Healthcare Providers:  IncredibleEmployment.be  This test is no t yet approved or cleared by the [place] FDA and  has been authorized for detection and/or diagnosis of SARS-CoV-2 by FDA under an Emergency Use Authorization (EUA). This EUA will remain  in effect (meaning this test can be used) for the duration of the COVID-19 declaration under Section 564(b)(1) of the Act, 21 U.S.C.section 360bbb-3(b)(1), unless the authorization is terminated  or revoked sooner.       Influenza A by PCR NEGATIVE NEGATIVE Final   Influenza B by PCR NEGATIVE NEGATIVE Final    Comment: (NOTE) The Xpert Xpress SARS-CoV-2/FLU/RSV plus assay is intended as an aid in the diagnosis of influenza from Nasopharyngeal swab specimens and should not be used as a sole basis for treatment. Nasal washings and aspirates are unacceptable for Xpert Xpress SARS-CoV-2/FLU/RSV testing.  Fact Sheet for Patients: EntrepreneurPulse.com.au  Fact Sheet for Healthcare  Providers: IncredibleEmployment.be  This test is not yet approved or cleared by the [place] FDA and has been authorized for detection and/or diagnosis of SARS-CoV-2 by FDA under an Emergency Use Authorization (EUA). This EUA will remain in effect (meaning this test can be used) for the duration of the COVID-19 declaration under Section 564(b)(1) of the Act, 21 U.S.C. section 360bbb-3(b)(1), unless the authorization is terminated or revoked.  Performed at [facility] [place] [address] [place], [place] [address]      Radiological Exams on Admission: CT Angio Chest PE W/Cm &/Or Wo Cm  Result Date: 09/24/2020 CLINICAL DATA:  Left-sided chest pain EXAM: CT ANGIOGRAPHY CHEST WITH CONTRAST TECHNIQUE: Multidetector CT imaging of the chest was performed using the standard protocol during bolus administration of intravenous contrast. Multiplanar CT image reconstructions and MIPs were obtained to evaluate the vascular anatomy. CONTRAST:  119mL OMNIPAQUE IOHEXOL 350 MG/ML SOLN  COMPARISON:  None. FINDINGS: Cardiovascular: There is a optimal opacification of the pulmonary arteries. There is no central,segmental, or subsegmental filling defects within the pulmonary arteries. The heart is normal in size. No pericardial effusion or thickening. No evidence right heart strain. There is normal three-vessel brachiocephalic anatomy without proximal stenosis. The thoracic aorta is normal in appearance. Mediastinum/Nodes: No hilar, mediastinal, or axillary adenopathy. Thyroid gland, trachea, and esophagus demonstrate no significant findings. Lungs/Pleura: Large area of airspace consolidation seen at the posterior left lung base with air bronchograms. Streaky airspace opacity at the right lung base. No pleural effusion. Upper Abdomen: No acute abnormalities present in the visualized portions of the upper abdomen. Musculoskeletal: No chest wall abnormality. No acute or significant  osseous findings. Review of the MIP images confirms the above findings. Abdomen/pelvis: Hepatobiliary: There is diffuse low density seen throughout the liver parenchyma. No focal hepatic lesion is seen. No intra or extrahepatic biliary ductal dilatation. The main portal vein is patent. No evidence of calcified gallstones, gallbladder wall thickening or biliary dilatation. Pancreas: Unremarkable. No pancreatic ductal dilatation or surrounding inflammatory changes. Spleen: Normal in size without focal abnormality. Adrenals/Urinary Tract: Both adrenal glands appear normal. The kidneys and collecting system appear normal without evidence of urinary tract calculus or hydronephrosis. Bladder is unremarkable. Stomach/Bowel: The stomach, small bowel, and colon are normal in appearance. No inflammatory changes, wall thickening, or obstructive findings.The appendix is normal. Vascular/Lymphatic: There are no enlarged mesenteric, retroperitoneal, or pelvic lymph nodes. No significant vascular findings are present. Reproductive: Again noted is a partially exophytic right uterine fibroid. A trace amount of free fluid seen within the cul-de-sac. Other: No evidence of abdominal wall mass or hernia. Musculoskeletal: No acute or significant osseous findings. IMPRESSION: No central, segmental, or subsegmental pulmonary embolism. Large area of airspace consolidation in the posterior lower lobe likely consistent with pneumonia. Hepatic steatosis Uterine fibroid Electronically Signed   By: Prudencio Pair M.D.   On: 09/24/2020 02:13   CT Abdomen Pelvis W Contrast  Result Date: 09/24/2020 CLINICAL DATA:  Left-sided chest pain EXAM: CT ANGIOGRAPHY CHEST WITH CONTRAST TECHNIQUE: Multidetector CT imaging of the chest was performed using the standard protocol during bolus administration of intravenous contrast. Multiplanar CT image reconstructions and MIPs were obtained to evaluate the vascular anatomy. CONTRAST:  157mL OMNIPAQUE IOHEXOL  350 MG/ML SOLN COMPARISON:  None. FINDINGS: Cardiovascular: There is a optimal opacification of the pulmonary arteries. There is no central,segmental, or subsegmental filling defects within the pulmonary arteries. The heart is normal in size. No pericardial effusion or thickening. No evidence right heart strain. There is normal three-vessel brachiocephalic anatomy without proximal stenosis. The thoracic aorta is normal in appearance. Mediastinum/Nodes: No hilar, mediastinal, or axillary adenopathy. Thyroid gland, trachea, and esophagus demonstrate no significant findings. Lungs/Pleura: Large area of airspace consolidation seen at the posterior left lung base with air bronchograms. Streaky airspace opacity at the right lung base. No pleural effusion. Upper Abdomen: No acute abnormalities present in the visualized portions of the upper abdomen. Musculoskeletal: No chest wall abnormality. No acute or significant osseous findings. Review of the MIP images confirms the above findings. Abdomen/pelvis: Hepatobiliary: There is diffuse low density seen throughout the liver parenchyma. No focal hepatic lesion is seen. No intra or extrahepatic biliary ductal dilatation. The main portal vein is patent. No evidence of calcified gallstones, gallbladder wall thickening or biliary dilatation. Pancreas: Unremarkable. No pancreatic ductal dilatation or surrounding inflammatory changes. Spleen: Normal in size without focal abnormality. Adrenals/Urinary Tract: Both adrenal glands appear normal.  The kidneys and collecting system appear normal without evidence of urinary tract calculus or hydronephrosis. Bladder is unremarkable. Stomach/Bowel: The stomach, small bowel, and colon are normal in appearance. No inflammatory changes, wall thickening, or obstructive findings.The appendix is normal. Vascular/Lymphatic: There are no enlarged mesenteric, retroperitoneal, or pelvic lymph nodes. No significant vascular findings are present.  Reproductive: Again noted is a partially exophytic right uterine fibroid. A trace amount of free fluid seen within the cul-de-sac. Other: No evidence of abdominal wall mass or hernia. Musculoskeletal: No acute or significant osseous findings. IMPRESSION: No central, segmental, or subsegmental pulmonary embolism. Large area of airspace consolidation in the posterior lower lobe likely consistent with pneumonia. Hepatic steatosis Uterine fibroid Electronically Signed   By: Prudencio Pair M.D.   On: 09/24/2020 02:13   DG Chest Port 1 View  Result Date: 09/23/2020 CLINICAL DATA:  Chest pain EXAM: PORTABLE CHEST 1 VIEW COMPARISON:  04/15/2020 FINDINGS: Right lung is clear. Airspace disease in the left lower lobe. Normal cardiomediastinal silhouette. No pneumothorax. IMPRESSION: Left lower lobe pneumonia. Electronically Signed   By: Donavan Foil M.D.   On: 09/23/2020 22:44    EKG: Independently reviewed. Sinus tachycardia, rate 113, QTc 520 ms.   Assessment/Plan   1. Severe alcohol dependence with withdrawal seizure  - Patient reports drinking four 40 oz beers daily, reports a hx of EtOH withdrawal seizure, presents from detention facility where she was unable to drink, EtOH level 125 on arrival last night (lower than prior ED visits), and later had apparent seizure in ED  - Assess with CIWA, continue treatment with IV Ativan, supplement vitamins    2. Pneumonia  - Presents with pleuritic pain and productive cough and is found to have leukocytosis and pneumonia on imaging  - She was started on antibiotics in ED  - Continue treatment with Rocephin and doxycycline, check strep pneumo and legionella antigen, trend procalcitonin    3. Schizophrenia  - Patient reports not taking any medications recently  - Assessment complicated by alcohol withdrawal    4. Hypertension  - Hypertensive and tachycardic on admission  - Treat alcohol withdrawal first, then use labetalol as needed    5. Asthma  - No  wheezing on admission  - Continue as-needed albuterol    6. Elevated LFTs  - AST 144, ALT 56, and total bili 1.8 on admission  - Abdominal exam benign, likely related to chronic alcohol abuse, will trend    7. Chest pain  - Presents with pleuritic left-sided chest pain with CTA chest negative for PE, notable for PNA  - Secondary to pneumonia, treat as above   8. Prolonged QT interval  - QTc 520 ms on admission  - Minimize QT-prolonging medications, check magnesium level    DVT prophylaxis: Lovenox Code Status: Full  Family Communication: Discussed with patient Disposition Plan:  Patient is from: Detention facility Anticipated d/c is to: TBD Anticipated d/c date is: 09/27/20 Patient currently: Starting treatment for life-threatening alcohol withdrawal, pneumonia  Consults called: None  Admission status: Inpatient     Vianne Bulls, MD [facility]  09/24/2020, 5:11 AM

## 2020-09-24 NOTE — ED Notes (Signed)
Pt to CT

## 2020-09-24 NOTE — ED Notes (Signed)
This RN was called to the room by the police officer sitting with the patient. The officer reported that he thought the patient was seizing. He says this happened for about 20 seconds but I entered the room and it continued for another 15 seconds. This RN turned the patient on her side, suctioned her due to aspiration risk, and observed patient biting at her tongue. Pt was back to responding within 1 minute of seizure ending. Dr. Dina Rich was notified and seizure pads were placed on the stretcher.

## 2020-09-25 DIAGNOSIS — D696 Thrombocytopenia, unspecified: Secondary | ICD-10-CM

## 2020-09-25 DIAGNOSIS — I10 Essential (primary) hypertension: Secondary | ICD-10-CM | POA: Diagnosis not present

## 2020-09-25 DIAGNOSIS — R7989 Other specified abnormal findings of blood chemistry: Secondary | ICD-10-CM

## 2020-09-25 DIAGNOSIS — F10230 Alcohol dependence with withdrawal, uncomplicated: Secondary | ICD-10-CM

## 2020-09-25 DIAGNOSIS — J181 Lobar pneumonia, unspecified organism: Secondary | ICD-10-CM | POA: Diagnosis not present

## 2020-09-25 DIAGNOSIS — R569 Unspecified convulsions: Secondary | ICD-10-CM

## 2020-09-25 DIAGNOSIS — R9431 Abnormal electrocardiogram [ECG] [EKG]: Secondary | ICD-10-CM

## 2020-09-25 DIAGNOSIS — F10231 Alcohol dependence with withdrawal delirium: Principal | ICD-10-CM

## 2020-09-25 LAB — URINALYSIS, ROUTINE W REFLEX MICROSCOPIC
Bilirubin Urine: NEGATIVE
Glucose, UA: NEGATIVE mg/dL
Hgb urine dipstick: NEGATIVE
Ketones, ur: 80 mg/dL — AB
Leukocytes,Ua: NEGATIVE
Nitrite: NEGATIVE
Protein, ur: NEGATIVE mg/dL
Specific Gravity, Urine: 1.026 (ref 1.005–1.030)
pH: 5 (ref 5.0–8.0)

## 2020-09-25 LAB — LEGIONELLA PNEUMOPHILA SEROGP 1 UR AG: L. pneumophila Serogp 1 Ur Ag: NEGATIVE

## 2020-09-25 LAB — COMPREHENSIVE METABOLIC PANEL
ALT: 45 U/L — ABNORMAL HIGH (ref 0–44)
AST: 104 U/L — ABNORMAL HIGH (ref 15–41)
Albumin: 3.1 g/dL — ABNORMAL LOW (ref 3.5–5.0)
Alkaline Phosphatase: 125 U/L (ref 38–126)
Anion gap: 12 (ref 5–15)
BUN: 5 mg/dL — ABNORMAL LOW (ref 6–20)
CO2: 20 mmol/L — ABNORMAL LOW (ref 22–32)
Calcium: 8.7 mg/dL — ABNORMAL LOW (ref 8.9–10.3)
Chloride: 105 mmol/L (ref 98–111)
Creatinine, Ser: 0.61 mg/dL (ref 0.44–1.00)
GFR, Estimated: >60 mL/min (ref >60)
Glucose, Bld: 67 mg/dL — ABNORMAL LOW (ref 70–99)
Potassium: 4.1 mmol/L (ref 3.5–5.1)
Sodium: 137 mmol/L (ref 135–145)
Total Bilirubin: 1.3 mg/dL — ABNORMAL HIGH (ref 0.3–1.2)
Total Protein: 6.2 g/dL — ABNORMAL LOW (ref 6.5–8.1)

## 2020-09-25 LAB — CBC
HCT: 33.6 % — ABNORMAL LOW (ref 36.0–46.0)
Hemoglobin: 11.8 g/dL — ABNORMAL LOW (ref 12.0–15.0)
MCH: 33.3 pg (ref 26.0–34.0)
MCHC: 35.1 g/dL (ref 30.0–36.0)
MCV: 94.9 fL (ref 80.0–100.0)
Platelets: 113 10*3/uL — ABNORMAL LOW (ref 150–400)
RBC: 3.54 MIL/uL — ABNORMAL LOW (ref 3.87–5.11)
RDW: 14.4 % (ref 11.5–15.5)
WBC: 14 10*3/uL — ABNORMAL HIGH (ref 4.0–10.5)
nRBC: 0 % (ref 0.0–0.2)

## 2020-09-25 LAB — PROCALCITONIN: Procalcitonin: 0.93 ng/mL

## 2020-09-25 LAB — MAGNESIUM: Magnesium: 1.8 mg/dL (ref 1.7–2.4)

## 2020-09-25 LAB — PROTIME-INR
INR: 1 (ref 0.8–1.2)
Prothrombin Time: 12.5 seconds (ref 11.4–15.2)

## 2020-09-25 MED ORDER — DEXMEDETOMIDINE HCL IN NACL 200 MCG/50ML IV SOLN
0.4000 ug/kg/h | INTRAVENOUS | Status: DC
  Administered 2020-09-25: 0.4 ug/kg/h via INTRAVENOUS
  Administered 2020-09-25 (×2): 1 ug/kg/h via INTRAVENOUS
  Administered 2020-09-26: 0.6 ug/kg/h via INTRAVENOUS
  Filled 2020-09-25 (×4): qty 50

## 2020-09-25 MED ORDER — PROPOFOL 1000 MG/100ML IV EMUL
5.0000 ug/kg/min | INTRAVENOUS | Status: DC
  Filled 2020-09-25: qty 100

## 2020-09-25 MED ORDER — HYDRALAZINE HCL 10 MG PO TABS
10.0000 mg | ORAL_TABLET | Freq: Three times a day (TID) | ORAL | Status: DC | PRN
  Administered 2020-09-25: 10 mg via ORAL
  Filled 2020-09-25: qty 1

## 2020-09-25 MED ORDER — DIAZEPAM 5 MG/ML IJ SOLN
INTRAMUSCULAR | Status: AC
  Filled 2020-09-25: qty 20

## 2020-09-25 MED ORDER — AMLODIPINE BESYLATE 5 MG PO TABS
5.0000 mg | ORAL_TABLET | Freq: Every day | ORAL | Status: DC
  Administered 2020-09-25 – 2020-09-26 (×2): 5 mg via ORAL
  Filled 2020-09-25 (×2): qty 1

## 2020-09-25 MED ORDER — DIAZEPAM 5 MG/ML IJ SOLN
10.0000 mg | INTRAMUSCULAR | Status: AC | PRN
  Administered 2020-09-25 (×4): 10 mg via INTRAVENOUS

## 2020-09-25 MED ORDER — MAGNESIUM SULFATE 2 GM/50ML IV SOLN
2.0000 g | Freq: Once | INTRAVENOUS | Status: AC
  Administered 2020-09-25: 2 g via INTRAVENOUS
  Filled 2020-09-25: qty 50

## 2020-09-25 MED ORDER — DIAZEPAM 5 MG/ML IJ SOLN: 10.0000 mg | INTRAMUSCULAR | Status: DC | PRN

## 2020-09-25 MED ORDER — LOSARTAN POTASSIUM 50 MG PO TABS
50.0000 mg | ORAL_TABLET | Freq: Every day | ORAL | Status: DC
  Administered 2020-09-25 – 2020-09-26 (×2): 50 mg via ORAL
  Filled 2020-09-25 (×2): qty 1

## 2020-09-25 MED ORDER — DEXTROSE-NACL 5-0.9 % IV SOLN: INTRAVENOUS | Status: DC

## 2020-09-25 MED ORDER — METOPROLOL TARTRATE 12.5 MG HALF TABLET
12.5000 mg | ORAL_TABLET | Freq: Two times a day (BID) | ORAL | Status: DC
  Administered 2020-09-25: 12.5 mg via ORAL
  Filled 2020-09-25 (×2): qty 1

## 2020-09-25 MED ORDER — HYDRALAZINE HCL 20 MG/ML IJ SOLN
10.0000 mg | INTRAMUSCULAR | Status: DC | PRN
  Administered 2020-09-25: 10 mg via INTRAVENOUS
  Filled 2020-09-25: qty 1

## 2020-09-25 MED ORDER — HYDRALAZINE HCL 25 MG PO TABS
25.0000 mg | ORAL_TABLET | Freq: Three times a day (TID) | ORAL | Status: DC | PRN
  Administered 2020-09-26: 25 mg via ORAL
  Filled 2020-09-25: qty 1

## 2020-09-25 NOTE — Consult Note (Signed)
NAME:  Jessica Case, MRN:  [id number], DOB:  09-13-1981, LOS: 1 ADMISSION DATE:  09/23/2020, CONSULTATION DATE:  09/25/20 REFERRING MD:  Erlinda Hong- TRH, CHIEF COMPLAINT:  Agitation, ETOH w/d  Brief History   Admitted from prison with left sided CP and CAP, now with ETOH w/d and severe HTN.  History of present illness   Ms. Jessica Case is a 39 year old woman admitted from prison after developing left-sided pleuritic chest pain on 12/6.  She was having cough productive of green sputum.  No recent sick contacts, Covid negative.  Fully vaccinated for Covid.  She drinks about 40 ounces of beer daily, but denies daily use of liquor.  She smokes cigars, 1/day.  She denies illicit drug use.  No previous history of alcohol withdrawal.  She had a witnessed seizure in the ED early in the morning on 12/7, but none since.  Since admission she has had severe hypertension and difficult to control anxiety this afternoon.  Critical care has been consulted for assistance with ETOH withdrawal that is refractory to CIWA.  Past Medical History  Schizophrenia Alcohol abuse Hypertension-poorly controlled  Significant Hospital Events     Consults:  PCCM  Procedures:    Significant Diagnostic Tests:  CTA chest 12/7-dense left lower lobe pneumonia, no pleural effusion.  No PE.  Hepatomegaly.  Micro Data:  Covid negative  Antimicrobials:  Ceftriaxone 12/6> Doxycycline 12/6>  Interim history/subjective:    Objective   Blood pressure (!) 189/121, pulse 78, temperature 98.8 F (37.1 C), temperature source Oral, resp. rate 16, height 5\' 1"  (1.549 m), weight 52 kg, last menstrual period 09/16/2020, SpO2 100 %.        Intake/Output Summary (Last 24 hours) at 09/25/2020 1717 Last data filed at 09/25/2020 1500 Gross per 24 hour  Intake 663.82 ml  Output --  Net 663.82 ml   Filed Weights   09/23/20 2122 09/24/20 0654  Weight: 49.9 kg 52 kg    Examination: General: Middle-aged woman sitting up in bed  no acute distress, agitated HENT: [place]/AT Lungs: Clear to auscultation bilaterally, not cooperating with deep inspiration.  Breathing comfortably on room air.  Speaking in full sentences. Cardiovascular: Regular rate and rhythm, no murmurs Abdomen: Soft, nondistended Extremities: No clubbing or cyanosis Neuro: Awake and alert, fidgeting, appears very impulsive. Psych: Only mildly cooperative with physical exam.  Not physically aggressive.  No attention to internal stimuli. Derm: Hirsutism, no rashes  Resolved Hospital Problem list     Assessment & Plan:  Alcohol withdrawal, seizure at admission but not since -Continue CIWA.  May consider escalating to Librium taper. -Adding Precedex -With baseline schizophrenia, she may benefit from antipsychotics once acute withdrawal is resolved. -Supplemental vitamins  Essential hypertension, likely acutely worsened due to alcohol withdrawal -She is unsure what medication she is supposed to be prescribed at home -Starting losartan 50 mg daily and amlodipine 5 mg daily.  Can titrate up as able.  Per the officer at bedside, she cannot be discharged until SBP less than 140.  Elevated transaminases and hyperbilirubinemia -improving -Checking PT/INR to calculate Maddrey discriminant index -Continue to monitor  Left lower lobe community-acquired pneumonia.  Urinary antigens negative for Legionella and strep -Agree with ceftriaxone and doxycycline to complete 7-day course -collect sputum culture if able  Tobacco abuse -Patient declines nicotine patch -Counseled on the importance of quitting smoking  Best practice (evaluated daily)   Per primary  Labs   CBC: Recent Labs  Lab 09/23/20 2204 09/24/20 0527 09/25/20 0256  WBC 10.7*  11.5* 14.0*  NEUTROABS 9.2*  --   --   HGB 12.1 11.2* 11.8*  HCT 34.8* 31.4* 33.6*  MCV 95.1 94.0 94.9  PLT 147* 135* 113*    Basic Metabolic Panel: Recent Labs  Lab 09/23/20 2204 09/24/20 0527 09/25/20 0256   NA 134* 137 137  K 4.1 3.2* 4.1  CL 97* 98 105  CO2 20* 24 20*  GLUCOSE 109* 125* 67*  BUN <5* <5* 5*  CREATININE 0.53 0.60 0.61  CALCIUM 8.3* 8.2* 8.7*  MG  --  1.1* 1.8  PHOS  --  2.9  --    GFR: Estimated Creatinine Clearance: 70.5 mL/min (by C-G formula based on SCr of 0.61 mg/dL). Recent Labs  Lab 09/23/20 2204 09/24/20 0527 09/25/20 0256  PROCALCITON  --  0.92 0.93  WBC 10.7* 11.5* 14.0*    Liver Function Tests: Recent Labs  Lab 09/23/20 2204 09/24/20 0527 09/25/20 0256  AST 144* 116* 104*  ALT 56* 54* 45*  ALKPHOS 137* 121 125  BILITOT 1.8* 1.9* 1.3*  PROT 7.2 6.6 6.2*  ALBUMIN 3.8 3.5 3.1*   Recent Labs  Lab 09/23/20 2204  LIPASE 30   No results for input(s): AMMONIA in the last 168 hours.  ABG    Component Value Date/Time   HCO3 24.4 09/23/2020 2205   TCO2 23 05/15/2011 2056   ACIDBASEDEF 0.0 04/15/2020 2048   O2SAT 47.8 09/23/2020 2205     Coagulation Profile: No results for input(s): INR, PROTIME in the last 168 hours.  Cardiac Enzymes: No results for input(s): CKTOTAL, CKMB, CKMBINDEX, TROPONINI in the last 168 hours.  HbA1C: Hemoglobin A1C  Date/Time Value Ref Range Status  11/15/2014 02:19 PM 5.0  Final    CBG: No results for input(s): GLUCAP in the last 168 hours.  Review of Systems:   Limited due to patient's agitation  Past Medical History  She,  has a past medical history of Arthritis, Claustrophobia, Cold, Fallopian tube abscess, Fibroids, Paranoid schizophrenia ([place]), and SVD (spontaneous vaginal delivery).   Surgical History    Past Surgical History:  Procedure Laterality Date  . BUNIONECTOMY    . bunyonectomy Bilateral    x 1  . LAPAROSCOPIC UNILATERAL SALPINGECTOMY Left 08/18/2016   Procedure: LAPAROSCOPIC UNILATERAL SALPINGECTOMY;  Surgeon: Lavonia Drafts, MD;  Location: [facility];  Service: Gynecology;  Laterality: Left;  . LAPAROSCOPY  08/18/2016   Procedure: LAPAROSCOPY OPERATIVE;  Surgeon: Lavonia Drafts, MD;  Location: [facility];  Service: Gynecology;;  . LAPAROTOMY  08/18/2016   Procedure: LAPAROTOMY;  Surgeon: Lavonia Drafts, MD;  Location: [facility];  Service: Gynecology;;  mini laparotomy  . MYOMECTOMY  08/18/2016   Procedure: MYOMECTOMY;  Surgeon: Lavonia Drafts, MD;  Location: [facility];  Service: Gynecology;;  . TOOTH EXTRACTION       Social History   reports that she has been smoking cigarettes. She has a 2.30 pack-year smoking history. She has never used smokeless tobacco. She reports current alcohol use. She reports previous drug use. Drugs: Marijuana and "Crack" cocaine.   Family History   Her family history includes Hypertension in her mother.   Allergies Allergies  Allergen Reactions  . Citrus Hives and Itching  . Food Hives, Itching and Other (See Comments)    Pt confirmed that it is not an anaphylactic reaction  . Strawberry Flavor     Hives all over body.   . Toradol [Ketorolac Tromethamine] Hives and Itching     Home Medications  Prior to Admission medications  Medication Sig Start Date End Date Taking? Authorizing Provider  loratadine (CLARITIN) 10 MG tablet Take 1 tablet (10 mg total) by mouth daily. Patient not taking: Reported on 04/15/2020 01/29/20   [name], NP  olopatadine (PATANOL) 0.1 % ophthalmic solution Place 1 drop into both eyes 2 (two) times daily. For itchy eyes Patient not taking: Reported on 04/15/2020 04/04/20   Argentina Donovan, PA-C  predniSONE (DELTASONE) 20 MG tablet Take 2 tablets (40 mg total) by mouth daily with breakfast. Patient not taking: Reported on 09/24/2020 04/17/20   Samuella Cota, MD  Prenat-Fe Poly-Methfol-FA-DHA (VITAFOL ULTRA) 29-0.6-0.4-200 MG CAPS Take 1 tablet by mouth daily. Patient not taking: Reported on 04/15/2020 07/26/19   Constant, Peggy, MD     Critical care time: 42 min.    Julian Hy, DO 09/25/20 6:07 PM [facility]

## 2020-09-25 NOTE — Progress Notes (Addendum)
[facility] Progress Note Patient Name: Cyleigh Massaro DOB: 04-22-1981 MRN: [id number]   Date of Service  09/25/2020  HPI/Events of Note  Patient with severe Delirium Tremens not responding to Ativan and Precedex.  eICU Interventions  Patient treated with Valium protocol with improvement of her symptoms.        Kerry Kass Nichele Slawson 09/25/2020, 10:51 PM

## 2020-09-25 NOTE — Progress Notes (Addendum)
[facility] Progress Note Patient Name: Jessica Case DOB: 1981/09/16 MRN: [id number]   Date of Service  09/25/2020  HPI/Events of Note  Camera follow up evaluation of patient; She is asleep with saturation on the monitor 100 %.   eICU Interventions  Bedside RN instructed to monitor patient's respiration closely. Precedex reduced to 1 mcg and oral Metoprolol held due to bradycardia on Precedex. PRN Hydralazine ordered for hypertension.        Kerry Kass Romain Erion 09/25/2020, 11:06 PM

## 2020-09-25 NOTE — Progress Notes (Signed)
PROGRESS NOTE    Jessica Case  XBJ:[id number] DOB: 08/22/1981 DOA: 09/23/2020 PCP: [name], NP    Chief Complaint  Patient presents with  . Chest Pain    Brief Narrative:  Jessica Case is a 39 y.o. female with medical history significant for asthma, schizophrenia, alcohol dependence, and hypertension, now presenting to the emergency department for evaluation of pleuritic chest pain and productive cough  She reports drinking four 40 ounce beers every day, but has been detained at a detention facility for the past day where she has not had any alcohol.  She reports history of a seizure once before in the setting of not drinking for a couple days.    Subjective:  She is restless, does not appear to have confusion, no seizure activity today Blood pressure elevated Officer at bedside  Assessment & Plan:   Principal Problem:   Alcohol withdrawal seizure ([place]) Active Problems:   Essential hypertension   Paranoid schizophrenia ([place])   Asthma   CAP (community acquired pneumonia)   Alcohol dependence ([place])   Elevated LFTs  Lobar pneumonia with leukocytosis, no hypoxia -Presented with pleuritic chest pain, short of breath ,saturating 91% on room air, tachycardic to the 120s -CTA personally reviewed, negative for PE but concerning for lobar pneumonia left lower lobe,  -COVID-19 screening negative, MRSA screening negative, urine strep  pneumo negative -Continue Rocephin, DC doxycycline  Hypertension Not on meds for blood pressure at home Systolic blood pressure 1 8190 this morning, likely due to alcohol withdrawal Start Lopressor is as needed hydralazine  Alcohol withdrawal seizures -She developed generalized seizure-like activity and was treated with Ativan. -Seizure precaution, CIWA protocol, continue folic acid and thiamine supplement -UDS negative -Alcohol level on presentation 125  Normocytic anemia /thrombocytopenia Likely from alcohol Repeat  CBC  LFT elevation Likely from alcohol check hepatitis panel  Hypokalemia/hypomagnesemia Potassium improved, mag 1.8 give IV mag today  QTC prolongation QTC 520 on presentation in the setting of tachycardia Repeat EKG, keep on telemetry Keep mag above 2, potassium above 4   History of schizophrenia and asthma Not on medication at home     DVT prophylaxis: enoxaparin (LOVENOX) injection 40 mg Start: 09/24/20 1000   Code Status: Full Family Communication: Patient Disposition:   Status is: Inpatient   Dispo: The patient is from: Detention facility               Anticipated d/c is to: Return to detention facility              Anticipated d/c date is: Currently not medically ready to discharge                Consultants:   None  Procedures:   None  Antimicrobials:   As above     Objective: Vitals:   09/25/20 0400 09/25/20 0500 09/25/20 0600 09/25/20 0700  BP: (!) 186/109 (!) 181/114 (!) 141/80 (!) 169/109  Pulse: 89 79 (!) 58 98  Resp: 18 (!) 21 17 20   Temp:      TempSrc:      SpO2: 100% 100% 99% 100%  Weight:      Height:        Intake/Output Summary (Last 24 hours) at 09/25/2020 0804 Last data filed at 09/24/2020 1800 Gross per 24 hour  Intake 833.3 ml  Output --  Net 833.3 ml   Filed Weights   09/23/20 2122 09/24/20 0654  Weight: 49.9 kg 52 kg    Examination:  General exam:  Restless, not following command, no confusion Respiratory system: Clear to auscultation. Respiratory effort normal. Cardiovascular system: S1 & S2 heard, RRR. No JVD, no murmur, No pedal edema. Gastrointestinal system: Abdomen is nondistended, soft and nontender.  Normal bowel sounds heard. Central nervous system: Alert and oriented. No focal neurological deficits. Extremities: Symmetric 5 x 5 power. Skin: No rashes, lesions or ulcers Psychiatry: Judgement and insight appear normal. Mood & affect appropriate.     Data Reviewed: I have personally reviewed  following labs and imaging studies  CBC: Recent Labs  Lab 09/23/20 2204 09/24/20 0527 09/25/20 0256  WBC 10.7* 11.5* 14.0*  NEUTROABS 9.2*  --   --   HGB 12.1 11.2* 11.8*  HCT 34.8* 31.4* 33.6*  MCV 95.1 94.0 94.9  PLT 147* 135* 113*    Basic Metabolic Panel: Recent Labs  Lab 09/23/20 2204 09/24/20 0527 09/25/20 0256  NA 134* 137 137  K 4.1 3.2* 4.1  CL 97* 98 105  CO2 20* 24 20*  GLUCOSE 109* 125* 67*  BUN <5* <5* 5*  CREATININE 0.53 0.60 0.61  CALCIUM 8.3* 8.2* 8.7*  MG  --  1.1* 1.8  PHOS  --  2.9  --     GFR: Estimated Creatinine Clearance: 70.5 mL/min (by C-G formula based on SCr of 0.61 mg/dL).  Liver Function Tests: Recent Labs  Lab 09/23/20 2204 09/24/20 0527 09/25/20 0256  AST 144* 116* 104*  ALT 56* 54* 45*  ALKPHOS 137* 121 125  BILITOT 1.8* 1.9* 1.3*  PROT 7.2 6.6 6.2*  ALBUMIN 3.8 3.5 3.1*    CBG: No results for input(s): GLUCAP in the last 168 hours.   Recent Results (from the past 240 hour(s))  Resp Panel by RT-PCR (Flu A&B, Covid) Nasopharyngeal Swab     Status: None   Collection Time: 09/23/20 10:58 PM   Specimen: Nasopharyngeal Swab; Nasopharyngeal(NP) swabs in vial transport medium  Result Value Ref Range Status   SARS Coronavirus 2 by RT PCR NEGATIVE NEGATIVE Final    Comment: (NOTE) SARS-CoV-2 target nucleic acids are NOT DETECTED.  The SARS-CoV-2 RNA is generally detectable in upper respiratory specimens during the acute phase of infection. The lowest concentration of SARS-CoV-2 viral copies this assay can detect is 138 copies/mL. A negative result does not preclude SARS-Cov-2 infection and should not be used as the sole basis for treatment or other patient management decisions. A negative result may occur with  improper specimen collection/handling, submission of specimen other than nasopharyngeal swab, presence of viral mutation(s) within the areas targeted by this assay, and inadequate number of viral copies(<138  copies/mL). A negative result must be combined with clinical observations, patient history, and epidemiological information. The expected result is Negative.  Fact Sheet for Patients:  EntrepreneurPulse.com.au  Fact Sheet for Healthcare Providers:  IncredibleEmployment.be  This test is no t yet approved or cleared by the [place] FDA and  has been authorized for detection and/or diagnosis of SARS-CoV-2 by FDA under an Emergency Use Authorization (EUA). This EUA will remain  in effect (meaning this test can be used) for the duration of the COVID-19 declaration under Section 564(b)(1) of the Act, 21 U.S.C.section 360bbb-3(b)(1), unless the authorization is terminated  or revoked sooner.       Influenza A by PCR NEGATIVE NEGATIVE Final   Influenza B by PCR NEGATIVE NEGATIVE Final    Comment: (NOTE) The Xpert Xpress SARS-CoV-2/FLU/RSV plus assay is intended as an aid in the diagnosis of influenza from Nasopharyngeal swab specimens and  should not be used as a sole basis for treatment. Nasal washings and aspirates are unacceptable for Xpert Xpress SARS-CoV-2/FLU/RSV testing.  Fact Sheet for Patients: EntrepreneurPulse.com.au  Fact Sheet for Healthcare Providers: IncredibleEmployment.be  This test is not yet approved or cleared by the [place] FDA and has been authorized for detection and/or diagnosis of SARS-CoV-2 by FDA under an Emergency Use Authorization (EUA). This EUA will remain in effect (meaning this test can be used) for the duration of the COVID-19 declaration under Section 564(b)(1) of the Act, 21 U.S.C. section 360bbb-3(b)(1), unless the authorization is terminated or revoked.  Performed at [facility], [place] [address], [place], [place] [address]   MRSA PCR Screening     Status: None   Collection Time: 09/24/20  6:31 AM   Specimen: Nasopharyngeal  Result Value Ref  Range Status   MRSA by PCR NEGATIVE NEGATIVE Final    Comment:        The GeneXpert MRSA Assay (FDA approved for NASAL specimens only), is one component of a comprehensive MRSA colonization surveillance program. It is not intended to diagnose MRSA infection nor to guide or monitor treatment for MRSA infections. Performed at [facility], [place] [address], [place], [place] [address]          Radiology Studies: CT Angio Chest PE W/Cm &/Or Wo Cm  Result Date: 09/24/2020 CLINICAL DATA:  Left-sided chest pain EXAM: CT ANGIOGRAPHY CHEST WITH CONTRAST TECHNIQUE: Multidetector CT imaging of the chest was performed using the standard protocol during bolus administration of intravenous contrast. Multiplanar CT image reconstructions and MIPs were obtained to evaluate the vascular anatomy. CONTRAST:  118mL OMNIPAQUE IOHEXOL 350 MG/ML SOLN COMPARISON:  None. FINDINGS: Cardiovascular: There is a optimal opacification of the pulmonary arteries. There is no central,segmental, or subsegmental filling defects within the pulmonary arteries. The heart is normal in size. No pericardial effusion or thickening. No evidence right heart strain. There is normal three-vessel brachiocephalic anatomy without proximal stenosis. The thoracic aorta is normal in appearance. Mediastinum/Nodes: No hilar, mediastinal, or axillary adenopathy. Thyroid gland, trachea, and esophagus demonstrate no significant findings. Lungs/Pleura: Large area of airspace consolidation seen at the posterior left lung base with air bronchograms. Streaky airspace opacity at the right lung base. No pleural effusion. Upper Abdomen: No acute abnormalities present in the visualized portions of the upper abdomen. Musculoskeletal: No chest wall abnormality. No acute or significant osseous findings. Review of the MIP images confirms the above findings. Abdomen/pelvis: Hepatobiliary: There is diffuse low density seen throughout the liver  parenchyma. No focal hepatic lesion is seen. No intra or extrahepatic biliary ductal dilatation. The main portal vein is patent. No evidence of calcified gallstones, gallbladder wall thickening or biliary dilatation. Pancreas: Unremarkable. No pancreatic ductal dilatation or surrounding inflammatory changes. Spleen: Normal in size without focal abnormality. Adrenals/Urinary Tract: Both adrenal glands appear normal. The kidneys and collecting system appear normal without evidence of urinary tract calculus or hydronephrosis. Bladder is unremarkable. Stomach/Bowel: The stomach, small bowel, and colon are normal in appearance. No inflammatory changes, wall thickening, or obstructive findings.The appendix is normal. Vascular/Lymphatic: There are no enlarged mesenteric, retroperitoneal, or pelvic lymph nodes. No significant vascular findings are present. Reproductive: Again noted is a partially exophytic right uterine fibroid. A trace amount of free fluid seen within the cul-de-sac. Other: No evidence of abdominal wall mass or hernia. Musculoskeletal: No acute or significant osseous findings. IMPRESSION: No central, segmental, or subsegmental pulmonary embolism. Large area of airspace consolidation in the  posterior lower lobe likely consistent with pneumonia. Hepatic steatosis Uterine fibroid Electronically Signed   By: Prudencio Pair M.D.   On: 09/24/2020 02:13   CT Abdomen Pelvis W Contrast  Result Date: 09/24/2020 CLINICAL DATA:  Left-sided chest pain EXAM: CT ANGIOGRAPHY CHEST WITH CONTRAST TECHNIQUE: Multidetector CT imaging of the chest was performed using the standard protocol during bolus administration of intravenous contrast. Multiplanar CT image reconstructions and MIPs were obtained to evaluate the vascular anatomy. CONTRAST:  161mL OMNIPAQUE IOHEXOL 350 MG/ML SOLN COMPARISON:  None. FINDINGS: Cardiovascular: There is a optimal opacification of the pulmonary arteries. There is no central,segmental, or  subsegmental filling defects within the pulmonary arteries. The heart is normal in size. No pericardial effusion or thickening. No evidence right heart strain. There is normal three-vessel brachiocephalic anatomy without proximal stenosis. The thoracic aorta is normal in appearance. Mediastinum/Nodes: No hilar, mediastinal, or axillary adenopathy. Thyroid gland, trachea, and esophagus demonstrate no significant findings. Lungs/Pleura: Large area of airspace consolidation seen at the posterior left lung base with air bronchograms. Streaky airspace opacity at the right lung base. No pleural effusion. Upper Abdomen: No acute abnormalities present in the visualized portions of the upper abdomen. Musculoskeletal: No chest wall abnormality. No acute or significant osseous findings. Review of the MIP images confirms the above findings. Abdomen/pelvis: Hepatobiliary: There is diffuse low density seen throughout the liver parenchyma. No focal hepatic lesion is seen. No intra or extrahepatic biliary ductal dilatation. The main portal vein is patent. No evidence of calcified gallstones, gallbladder wall thickening or biliary dilatation. Pancreas: Unremarkable. No pancreatic ductal dilatation or surrounding inflammatory changes. Spleen: Normal in size without focal abnormality. Adrenals/Urinary Tract: Both adrenal glands appear normal. The kidneys and collecting system appear normal without evidence of urinary tract calculus or hydronephrosis. Bladder is unremarkable. Stomach/Bowel: The stomach, small bowel, and colon are normal in appearance. No inflammatory changes, wall thickening, or obstructive findings.The appendix is normal. Vascular/Lymphatic: There are no enlarged mesenteric, retroperitoneal, or pelvic lymph nodes. No significant vascular findings are present. Reproductive: Again noted is a partially exophytic right uterine fibroid. A trace amount of free fluid seen within the cul-de-sac. Other: No evidence of  abdominal wall mass or hernia. Musculoskeletal: No acute or significant osseous findings. IMPRESSION: No central, segmental, or subsegmental pulmonary embolism. Large area of airspace consolidation in the posterior lower lobe likely consistent with pneumonia. Hepatic steatosis Uterine fibroid Electronically Signed   By: Prudencio Pair M.D.   On: 09/24/2020 02:13   DG Chest Port 1 View  Result Date: 09/23/2020 CLINICAL DATA:  Chest pain EXAM: PORTABLE CHEST 1 VIEW COMPARISON:  04/15/2020 FINDINGS: Right lung is clear. Airspace disease in the left lower lobe. Normal cardiomediastinal silhouette. No pneumothorax. IMPRESSION: Left lower lobe pneumonia. Electronically Signed   By: Donavan Foil M.D.   On: 09/23/2020 22:44        Scheduled Meds: . Chlorhexidine Gluconate Cloth  6 each Topical Daily  . enoxaparin (LOVENOX) injection  40 mg Subcutaneous Q24H  . folic acid  1 mg Oral Daily  . LORazepam  0-4 mg Intravenous Q4H   Followed by  . [START ON 09/26/2020] LORazepam  0-4 mg Intravenous Q8H  . mouth rinse  15 mL Mouth Rinse BID  . multivitamin with minerals  1 tablet Oral Daily  . sodium chloride flush  3 mL Intravenous Q12H  . thiamine  100 mg Oral Daily   Or  . thiamine  100 mg Intravenous Daily   Continuous Infusions: . cefTRIAXone (ROCEPHIN)  IV Stopped (09/24/20 2245)  . doxycycline (VIBRAMYCIN) IV Stopped (09/25/20 0125)     LOS: 1 day   Time spent: 2mins Greater than 50% of this time was spent in counseling, explanation of diagnosis, planning of further management, and coordination of care.  I have personally reviewed and interpreted on  09/25/2020 daily labs, tele strips, imagings as discussed above under date review session and assessment and plans.  I reviewed all nursing notes, pharmacy notes,  vitals, pertinent old records  I have discussed plan of care as described above with RN , patient  on 09/25/2020  Voice Recognition /Dragon dictation system was used to create  this note, attempts have been made to correct errors. Please contact the author with questions and/or clarifications.   Florencia Reasons, MD PhD FACP [facility]  Available via Epic secure chat 7am-7pm for nonurgent issues Please page for urgent issues To page the attending provider between 7A-7P or the covering provider during after hours 7P-7A, please log into the web site [email or web address] and access using universal [place] password for that web site. If you do not have the password, please call the hospital operator.    09/25/2020, 8:04 AM

## 2020-09-25 NOTE — Progress Notes (Signed)
TOC contacted by [facility]. They need to be notified once estimated DC date is determined to make sure patient can return to [facility]. Please call Sydnee Levans at [phone] once EDD is determined.

## 2020-09-26 DIAGNOSIS — J189 Pneumonia, unspecified organism: Secondary | ICD-10-CM

## 2020-09-26 DIAGNOSIS — F10239 Alcohol dependence with withdrawal, unspecified: Secondary | ICD-10-CM

## 2020-09-26 LAB — CBC
HCT: 35.9 % — ABNORMAL LOW (ref 36.0–46.0)
Hemoglobin: 12.4 g/dL (ref 12.0–15.0)
MCH: 33.1 pg (ref 26.0–34.0)
MCHC: 34.5 g/dL (ref 30.0–36.0)
MCV: 95.7 fL (ref 80.0–100.0)
Platelets: 122 10*3/uL — ABNORMAL LOW (ref 150–400)
RBC: 3.75 MIL/uL — ABNORMAL LOW (ref 3.87–5.11)
RDW: 14 % (ref 11.5–15.5)
WBC: 9.7 10*3/uL (ref 4.0–10.5)
nRBC: 0 % (ref 0.0–0.2)

## 2020-09-26 LAB — COMPREHENSIVE METABOLIC PANEL
ALT: 43 U/L (ref 0–44)
AST: 68 U/L — ABNORMAL HIGH (ref 15–41)
Albumin: 3 g/dL — ABNORMAL LOW (ref 3.5–5.0)
Alkaline Phosphatase: 120 U/L (ref 38–126)
Anion gap: 14 (ref 5–15)
BUN: 5 mg/dL — ABNORMAL LOW (ref 6–20)
CO2: 19 mmol/L — ABNORMAL LOW (ref 22–32)
Calcium: 9.1 mg/dL (ref 8.9–10.3)
Chloride: 104 mmol/L (ref 98–111)
Creatinine, Ser: 0.54 mg/dL (ref 0.44–1.00)
GFR, Estimated: >60 mL/min (ref >60)
Glucose, Bld: 123 mg/dL — ABNORMAL HIGH (ref 70–99)
Potassium: 3.9 mmol/L (ref 3.5–5.1)
Sodium: 137 mmol/L (ref 135–145)
Total Bilirubin: 1.4 mg/dL — ABNORMAL HIGH (ref 0.3–1.2)
Total Protein: 6.2 g/dL — ABNORMAL LOW (ref 6.5–8.1)

## 2020-09-26 LAB — HEPATITIS PANEL, ACUTE
HCV Ab: NONREACTIVE
Hep A IgM: NONREACTIVE
Hep B C IgM: NONREACTIVE
Hepatitis B Surface Ag: NONREACTIVE

## 2020-09-26 LAB — TRIGLYCERIDES: Triglycerides: 100 mg/dL (ref <150)

## 2020-09-26 LAB — MAGNESIUM: Magnesium: 1.6 mg/dL — ABNORMAL LOW (ref 1.7–2.4)

## 2020-09-26 MED ORDER — AMLODIPINE BESYLATE 5 MG PO TABS
5.0000 mg | ORAL_TABLET | Freq: Once | ORAL | Status: AC
  Administered 2020-09-26: 5 mg via ORAL
  Filled 2020-09-26: qty 1

## 2020-09-26 MED ORDER — AMLODIPINE BESYLATE 10 MG PO TABS
10.0000 mg | ORAL_TABLET | Freq: Every day | ORAL | Status: DC
  Filled 2020-09-26: qty 1

## 2020-09-26 MED ORDER — MAGNESIUM SULFATE 2 GM/50ML IV SOLN
2.0000 g | Freq: Once | INTRAVENOUS | Status: AC
  Administered 2020-09-26: 2 g via INTRAVENOUS
  Filled 2020-09-26: qty 50

## 2020-09-26 MED ORDER — DEXMEDETOMIDINE HCL IN NACL 200 MCG/50ML IV SOLN: 0.2000 ug/kg/h | INTRAVENOUS | Status: DC

## 2020-09-26 MED ORDER — LIP MEDEX EX OINT
TOPICAL_OINTMENT | CUTANEOUS | Status: DC | PRN
  Administered 2020-09-26: 1 via TOPICAL
  Filled 2020-09-26: qty 7

## 2020-09-26 MED ORDER — DIAZEPAM 5 MG PO TABS
5.0000 mg | ORAL_TABLET | Freq: Three times a day (TID) | ORAL | Status: DC
  Administered 2020-09-26 – 2020-09-27 (×4): 5 mg via ORAL
  Filled 2020-09-26 (×6): qty 1

## 2020-09-26 MED ORDER — POTASSIUM CHLORIDE CRYS ER 20 MEQ PO TBCR
40.0000 meq | EXTENDED_RELEASE_TABLET | Freq: Once | ORAL | Status: AC
  Administered 2020-09-26: 40 meq via ORAL
  Filled 2020-09-26: qty 2

## 2020-09-26 MED ORDER — HYDRALAZINE HCL 20 MG/ML IJ SOLN: 10.0000 mg | INTRAMUSCULAR | Status: DC | PRN

## 2020-09-26 MED ORDER — LOSARTAN POTASSIUM 50 MG PO TABS
50.0000 mg | ORAL_TABLET | Freq: Every day | ORAL | Status: DC
  Administered 2020-09-27: 50 mg via ORAL
  Filled 2020-09-26: qty 1

## 2020-09-26 MED ORDER — DIAZEPAM 5 MG/ML IJ SOLN
2.5000 mg | INTRAMUSCULAR | Status: DC | PRN
  Administered 2020-09-27: 5 mg via INTRAVENOUS
  Filled 2020-09-26: qty 2

## 2020-09-26 MED ORDER — LOSARTAN POTASSIUM 50 MG PO TABS
50.0000 mg | ORAL_TABLET | Freq: Once | ORAL | Status: AC
  Administered 2020-09-26: 50 mg via ORAL
  Filled 2020-09-26: qty 1

## 2020-09-26 MED ORDER — HYDROCHLOROTHIAZIDE 25 MG PO TABS
25.0000 mg | ORAL_TABLET | Freq: Every day | ORAL | Status: DC
  Administered 2020-09-26: 25 mg via ORAL
  Filled 2020-09-26 (×2): qty 1

## 2020-09-26 MED ORDER — LOSARTAN POTASSIUM 50 MG PO TABS: 100.0000 mg | ORAL_TABLET | Freq: Every day | ORAL | Status: DC

## 2020-09-26 NOTE — Progress Notes (Signed)
[facility] Progress Note Patient Name: Jessica Case DOB: 1981-06-03 MRN: [id number]   Date of Service  09/26/2020  HPI/Events of Note  Patient getting agitated again.  eICU Interventions  PRN iv Valium ordered, and Precedex infusion re-ordered.        Frederik Pear 09/26/2020, 6:43 AM

## 2020-09-26 NOTE — Progress Notes (Addendum)
Patient's arms and legs currently cuffed to the bed with police's handcuffs. Skin intact, warm, dry, no evidence of trauma at cuff site. Pulses +2 in bilateral wrists and feet   Patient switched from officer's handcuffs to soft bilateral wrist restraints at 1718. Redness noted to wrists. Pulse +2 bilateral with warm, dry hands.

## 2020-09-26 NOTE — Progress Notes (Signed)
[facility] Progress Note Patient Name: Jessica Case DOB: 1981/01/20 MRN: [id number]   Date of Service  09/26/2020  HPI/Events of Note  Patient remains asleep but rouses with stimulation per bedside RN, Precedex down to 0.5 mcg but patient remains bradycardic in the low 40's. BP 160's/90's. She is getting PRN Hydralazine for her elevated blood pressure.  eICU Interventions  Precedex discontinued.        Kerry Kass Anzley Dibbern 09/26/2020, 5:42 AM

## 2020-09-26 NOTE — TOC Progression Note (Signed)
Transition of Care [facility]) - Progression Note    Patient Details  Name: Khila Papp MRN: [id number] Date of Birth: [birth year]  [facility] CM/SW Contact  Leeroy Cha, RN Phone Number: 09/26/2020, 8:53 AM  Clinical Narrative:     39 year old woman [facility] after developing left-sided pleuritic chest pain on 12/6.  She was having cough productive of green sputum.  No recent sick contacts, Covid negative.  Fully vaccinated for Covid.  She drinks about 40 ounces of beer daily, but denies daily use of liquor.  She smokes cigars, 1/day.  She denies illicit drug use.  No previous history of alcohol withdrawal.  She had a witnessed seizure in the ED early in the morning on 12/7, but none since.  Since admission she has had severe hypertension and difficult to control anxiety this afternoon.  Critical care has been consulted for assistance with ETOH withdrawal that is refractory to CIWA.  Past Medical History  Schizophrenia Alcohol abuse Hypertension-poorly controlled     [facility] Following for progression-agitation and cuffed to bed by the gcsd.  Expected Discharge Plan: Corrections Facility Barriers to Discharge: No Barriers Identified  Expected Discharge Plan and Services Expected Discharge Plan: [place] [facility]   Discharge Planning Services: CM Consult   Living arrangements for the past 2 months: Correctional Facility                                       Social Determinants of Health (SDOH) Interventions    Readmission Risk Interventions No flowsheet data found.

## 2020-09-26 NOTE — Consult Note (Signed)
NAME:  Jessica Case, MRN:  [id number], DOB:  04-05-1981, LOS: 2 ADMISSION DATE:  09/23/2020, CONSULTATION DATE:  09/25/20 REFERRING MD:  Erlinda Hong- TRH, CHIEF COMPLAINT:  Agitation, ETOH w/d  Brief History   Admitted from prison with left sided CP and CAP, now with ETOH w/d and severe HTN.  History of present illness   Jessica Case is a 39 year old woman admitted from prison after developing left-sided pleuritic chest pain on 12/6.  She was having cough productive of green sputum.  No recent sick contacts, Covid negative.  Fully vaccinated for Covid.  She drinks about 40 ounces of beer daily, but denies daily use of liquor.  She smokes cigars, 1/day.  She denies illicit drug use.  No previous history of alcohol withdrawal.  She had a witnessed seizure in the ED early in the morning on 12/7, but none since.  Since admission she has had severe hypertension and difficult to control anxiety this afternoon.  Critical care has been consulted for assistance with ETOH withdrawal that is refractory to CIWA.  Past Medical History  Schizophrenia Alcohol abuse Hypertension-poorly controlled  Significant Hospital Events     Consults:  PCCM  Procedures:    Significant Diagnostic Tests:  CTA chest 12/7-dense left lower lobe pneumonia, no pleural effusion.  No PE.  Hepatomegaly.  Micro Data:  Covid negative  Antimicrobials:  Ceftriaxone 12/6> Doxycycline 12/6>  Interim history/subjective:  Started on scheduled valium in addition to precedex overnight.  Objective   Blood pressure (!) 186/109, pulse (!) 42, temperature 98.1 F (36.7 C), temperature source Axillary, resp. rate 10, height 5\' 1"  (1.549 m), weight 52 kg, last menstrual period 09/16/2020, SpO2 100 %.        Intake/Output Summary (Last 24 hours) at 09/26/2020 0759 Last data filed at 09/26/2020 1829 Gross per 24 hour  Intake 903.3 ml  Output 425 ml  Net 478.3 ml   Filed Weights   09/23/20 2122 09/24/20 0654  Weight: 49.9 kg  52 kg    Examination: General: middle aged woman in NAD, handcuffed to the bed HENT: [place]/AT, eyes anicteric, oral mucosa moist Lungs: CTAB, breathing comfortably on RA. Cardiovascular: RRR, no murmurs Abdomen: soft, NT, ND Extremities: no c/c/e Neuro: arousable to stimulation, more lethargic today, moving all extremities Psych: less agitated, no response to internal stimuli Derm: no rashes, hirsuitism  Resolved Hospital Problem list     Assessment & Plan:  Alcohol withdrawal, seizure at admission but not since -Continue CIWA. Added scheduled valium. -Con't Precedex PRN, although develops significant bradycardia from it. -With baseline schizophrenia, she may benefit from antipsychotics once acute withdrawal is resolved. -Supplemental vitamins -Can come out of handcuffs if she is more cooperative, but she has been pulling at IVs.  Will order soft restraints rather than continue handcuffs.  Essential hypertension, likely acutely worsened due to alcohol withdrawal -Increase losartan to 100mg  daily and amlodipine to 10mg  daily>> then hypotensive with valium, so decrease losartan to 50mg  tomorrow -adding HCTZ 25mg  daily -con't to monitor electrolytes; supplemental K+ and Mg+ today  Elevated transaminases- improving  hyperbilirubinemia -essentially stable -Maddrey discriminant index 3.4> a/w good prognosis, no need for steroids -Continue to monitor  Left lower lobe community-acquired pneumonia.  Urinary antigens negative for Legionella and strep -Agree with ceftriaxone and doxycycline to complete 7-day course -collect sputum culture if able  Tobacco abuse -Patient declines nicotine patch -Counseled on the importance of quitting smoking  Best practice (evaluated daily)   Per primary  Labs   CBC: Recent  Labs  Lab 09/23/20 2204 09/24/20 0527 09/25/20 0256 09/26/20 0314  WBC 10.7* 11.5* 14.0* 9.7  NEUTROABS 9.2*  --   --   --   HGB 12.1 11.2* 11.8* 12.4  HCT 34.8* 31.4*  33.6* 35.9*  MCV 95.1 94.0 94.9 95.7  PLT 147* 135* 113* 122*    Basic Metabolic Panel: Recent Labs  Lab 09/23/20 2204 09/24/20 0527 09/25/20 0256 09/26/20 0314  NA 134* 137 137 137  K 4.1 3.2* 4.1 3.9  CL 97* 98 105 104  CO2 20* 24 20* 19*  GLUCOSE 109* 125* 67* 123*  BUN <5* <5* 5* 5*  CREATININE 0.53 0.60 0.61 0.54  CALCIUM 8.3* 8.2* 8.7* 9.1  MG  --  1.1* 1.8 1.6*  PHOS  --  2.9  --   --    GFR: Estimated Creatinine Clearance: 70.5 mL/min (by C-G formula based on SCr of 0.54 mg/dL). Recent Labs  Lab 09/23/20 2204 09/24/20 0527 09/25/20 0256 09/26/20 0314  PROCALCITON  --  0.92 0.93  --   WBC 10.7* 11.5* 14.0* 9.7    Liver Function Tests: Recent Labs  Lab 09/23/20 2204 09/24/20 0527 09/25/20 0256 09/26/20 0314  AST 144* 116* 104* 68*  ALT 56* 54* 45* 43  ALKPHOS 137* 121 125 120  BILITOT 1.8* 1.9* 1.3* 1.4*  PROT 7.2 6.6 6.2* 6.2*  ALBUMIN 3.8 3.5 3.1* 3.0*   Recent Labs  Lab 09/23/20 2204  LIPASE 30   No results for input(s): AMMONIA in the last 168 hours.  ABG    Component Value Date/Time   HCO3 24.4 09/23/2020 2205   TCO2 23 05/15/2011 2056   ACIDBASEDEF 0.0 04/15/2020 2048   O2SAT 47.8 09/23/2020 2205     Coagulation Profile: Recent Labs  Lab 09/25/20 1756  INR 1.0    Cardiac Enzymes: No results for input(s): CKTOTAL, CKMB, CKMBINDEX, TROPONINI in the last 168 hours.  HbA1C: Hemoglobin A1C  Date/Time Value Ref Range Status  11/15/2014 02:19 PM 5.0  Final    CBG: No results for input(s): GLUCAP in the last 168 hours.   This patient is critically ill with multiple organ system failure which requires frequent high complexity decision making, assessment, support, evaluation, and titration of therapies. This was completed through the application of advanced monitoring technologies and extensive interpretation of multiple databases. During this encounter critical care time was devoted to patient care services described in this note  for 39 minutes.  Julian Hy, DO 09/26/20 8:12 AM [facility]

## 2020-09-26 NOTE — Progress Notes (Signed)
PROGRESS NOTE    Jessica Case  RDE:[id number] DOB: 03-04-1981 DOA: 09/23/2020 PCP: [name], NP    Chief Complaint  Patient presents with  . Chest Pain    Brief Narrative:  Jessica Case is a 39 y.o. female with medical history significant for asthma, schizophrenia, alcohol dependence, and hypertension, now presenting to the emergency department for evaluation of pleuritic chest pain and productive cough  She reports drinking four 40 ounce beers every day, but has been detained at a detention facility for the past day where she has not had any alcohol.  She reports history of a seizure once before in the setting of not drinking for a couple days.    Subjective:  She was started on precedex yesterday pm due to increase confusion and agitation not responding to ativan, she became bradycardia on precedex, this was stopped, she is started on valium She is restless, still shaky but interactive and oriented x3 this afternoon Blood pressure improved Officer at bedside  Assessment & Plan:   Principal Problem:   Alcohol withdrawal seizure ([place]) Active Problems:   Essential hypertension   Paranoid schizophrenia ([place])   Asthma   CAP (community acquired pneumonia)   Alcohol dependence ([place])   Elevated LFTs  Lobar pneumonia with leukocytosis, no hypoxia -Presented with pleuritic chest pain, short of breath ,saturating 91% on room air, tachycardic to the 120s -CTA personally reviewed, negative for PE but concerning for lobar pneumonia left lower  -COVID-19 screening negative, MRSA screening negative, urine strep  pneumo and urine Legionella negative -wbc normalized ,continue Rocephin, DC doxycycline  Hypertension Not on meds for blood pressure at home Systolic blood pressure 1 8190 this morning, likely due to alcohol withdrawal  Lopressor discontinued due to bradycardia Restarted on Norvasc Cozaar and HCTZ per critical care  Alcohol withdrawal seizures -She  developed generalized seizure in the ED was treated with Ativan. -Seizure precaution, CIWA protocol, continue folic acid and thiamine supplement -UDS negative -Alcohol level on presentation 125 -Put on Precedex drip overnight, stopped due to bradycardia, currently she is on Valium -Appreciate critical care input  Normocytic anemia /thrombocytopenia Likely from alcohol Improving on repeat CBC, monitor  LFT elevation Likely from alcohol, improving  hepatitis panel negative  Hypokalemia/hypomagnesemia Potassium improved, mag 1.6 give IV mag again today  QTC prolongation QTC 520 on presentation in the setting of tachycardia Repeat EKG, keep on telemetry Keep mag above 2, potassium above 4   History of schizophrenia and asthma Not on medication at home     DVT prophylaxis: enoxaparin (LOVENOX) injection 40 mg Start: 09/24/20 1000   Code Status: Full Family Communication: Patient Disposition:   Status is: Inpatient   Dispo: The patient is from: Detention facility               Anticipated d/c is to: Return to detention facility              Anticipated d/c date is: Currently not medically ready to discharge                Consultants:   None  Procedures:   None  Antimicrobials:   As above     Objective: Vitals:   09/26/20 1000 09/26/20 1100 09/26/20 1200 09/26/20 1205  BP: 100/72 99/66 107/60   Pulse:  74 75   Resp: 10 10 19    Temp: (!) 94.3 F (34.6 C)   97.9 F (36.6 C)  TempSrc: Rectal   Oral  SpO2:  100% 99%  Weight:      Height:        Intake/Output Summary (Last 24 hours) at 09/26/2020 1509 Last data filed at 09/26/2020 1200 Gross per 24 hour  Intake 305.92 ml  Output 425 ml  Net -119.08 ml   Filed Weights   09/23/20 2122 09/24/20 0654  Weight: 49.9 kg 52 kg    Examination:  General exam: Restless, shaky,  Currently aaox3 Respiratory system: Clear to auscultation. Respiratory effort normal. Cardiovascular system: S1 & S2 heard,  RRR. No JVD, no murmur, No pedal edema. Gastrointestinal system: Abdomen is nondistended, soft and nontender.  Normal bowel sounds heard. Central nervous system: Alert and oriented. No focal neurological deficits. Extremities: Symmetric 5 x 5 power. Skin: No rashes, lesions or ulcers Psychiatry: Judgement and insight appear normal. Mood & affect appropriate.     Data Reviewed: I have personally reviewed following labs and imaging studies  CBC: Recent Labs  Lab 09/23/20 2204 09/24/20 0527 09/25/20 0256 09/26/20 0314  WBC 10.7* 11.5* 14.0* 9.7  NEUTROABS 9.2*  --   --   --   HGB 12.1 11.2* 11.8* 12.4  HCT 34.8* 31.4* 33.6* 35.9*  MCV 95.1 94.0 94.9 95.7  PLT 147* 135* 113* 122*    Basic Metabolic Panel: Recent Labs  Lab 09/23/20 2204 09/24/20 0527 09/25/20 0256 09/26/20 0314  NA 134* 137 137 137  K 4.1 3.2* 4.1 3.9  CL 97* 98 105 104  CO2 20* 24 20* 19*  GLUCOSE 109* 125* 67* 123*  BUN <5* <5* 5* 5*  CREATININE 0.53 0.60 0.61 0.54  CALCIUM 8.3* 8.2* 8.7* 9.1  MG  --  1.1* 1.8 1.6*  PHOS  --  2.9  --   --     GFR: Estimated Creatinine Clearance: 70.5 mL/min (by C-G formula based on SCr of 0.54 mg/dL).  Liver Function Tests: Recent Labs  Lab 09/23/20 2204 09/24/20 0527 09/25/20 0256 09/26/20 0314  AST 144* 116* 104* 68*  ALT 56* 54* 45* 43  ALKPHOS 137* 121 125 120  BILITOT 1.8* 1.9* 1.3* 1.4*  PROT 7.2 6.6 6.2* 6.2*  ALBUMIN 3.8 3.5 3.1* 3.0*    CBG: No results for input(s): GLUCAP in the last 168 hours.   Recent Results (from the past 240 hour(s))  Resp Panel by RT-PCR (Flu A&B, Covid) Nasopharyngeal Swab     Status: None   Collection Time: 09/23/20 10:58 PM   Specimen: Nasopharyngeal Swab; Nasopharyngeal(NP) swabs in vial transport medium  Result Value Ref Range Status   SARS Coronavirus 2 by RT PCR NEGATIVE NEGATIVE Final    Comment: (NOTE) SARS-CoV-2 target nucleic acids are NOT DETECTED.  The SARS-CoV-2 RNA is generally detectable in upper  respiratory specimens during the acute phase of infection. The lowest concentration of SARS-CoV-2 viral copies this assay can detect is 138 copies/mL. A negative result does not preclude SARS-Cov-2 infection and should not be used as the sole basis for treatment or other patient management decisions. A negative result may occur with  improper specimen collection/handling, submission of specimen other than nasopharyngeal swab, presence of viral mutation(s) within the areas targeted by this assay, and inadequate number of viral copies(<138 copies/mL). A negative result must be combined with clinical observations, patient history, and epidemiological information. The expected result is Negative.  Fact Sheet for Patients:  EntrepreneurPulse.com.au  Fact Sheet for Healthcare Providers:  IncredibleEmployment.be  This test is no t yet approved or cleared by the [place] FDA and  has been authorized for detection  and/or diagnosis of SARS-CoV-2 by FDA under an Emergency Use Authorization (EUA). This EUA will remain  in effect (meaning this test can be used) for the duration of the COVID-19 declaration under Section 564(b)(1) of the Act, 21 U.S.C.section 360bbb-3(b)(1), unless the authorization is terminated  or revoked sooner.       Influenza A by PCR NEGATIVE NEGATIVE Final   Influenza B by PCR NEGATIVE NEGATIVE Final    Comment: (NOTE) The Xpert Xpress SARS-CoV-2/FLU/RSV plus assay is intended as an aid in the diagnosis of influenza from Nasopharyngeal swab specimens and should not be used as a sole basis for treatment. Nasal washings and aspirates are unacceptable for Xpert Xpress SARS-CoV-2/FLU/RSV testing.  Fact Sheet for Patients: EntrepreneurPulse.com.au  Fact Sheet for Healthcare Providers: IncredibleEmployment.be  This test is not yet approved or cleared by the [place] FDA and has been  authorized for detection and/or diagnosis of SARS-CoV-2 by FDA under an Emergency Use Authorization (EUA). This EUA will remain in effect (meaning this test can be used) for the duration of the COVID-19 declaration under Section 564(b)(1) of the Act, 21 U.S.C. section 360bbb-3(b)(1), unless the authorization is terminated or revoked.  Performed at [facility], [place] [address], [place], [place] [address]   MRSA PCR Screening     Status: None   Collection Time: 09/24/20  6:31 AM   Specimen: Nasopharyngeal  Result Value Ref Range Status   MRSA by PCR NEGATIVE NEGATIVE Final    Comment:        The GeneXpert MRSA Assay (FDA approved for NASAL specimens only), is one component of a comprehensive MRSA colonization surveillance program. It is not intended to diagnose MRSA infection nor to guide or monitor treatment for MRSA infections. Performed at [facility], [place] [address], [place], [place] [address]          Radiology Studies: No results found.      Scheduled Meds: . [START ON 09/27/2020] amLODipine  10 mg Oral Daily  . Chlorhexidine Gluconate Cloth  6 each Topical Daily  . diazepam  5 mg Oral Q8H  . enoxaparin (LOVENOX) injection  40 mg Subcutaneous Q24H  . folic acid  1 mg Oral Daily  . hydrochlorothiazide  25 mg Oral Daily  . [START ON 09/27/2020] losartan  50 mg Oral Daily  . mouth rinse  15 mL Mouth Rinse BID  . multivitamin with minerals  1 tablet Oral Daily  . sodium chloride flush  3 mL Intravenous Q12H  . thiamine  100 mg Oral Daily   Or  . thiamine  100 mg Intravenous Daily   Continuous Infusions: . cefTRIAXone (ROCEPHIN)  IV Stopped (09/26/20 0015)  . dexmedetomidine (PRECEDEX) IV infusion Stopped (09/26/20 0750)     LOS: 2 days   Time spent: 7mins Greater than 50% of this time was spent in counseling, explanation of diagnosis, planning of further management, and coordination of care.  I have personally  reviewed and interpreted on  09/26/2020 daily labs, tele strips, imagings as discussed above under date review session and assessment and plans.  I reviewed all nursing notes, pharmacy notes,  vitals, pertinent old records  I have discussed plan of care as described above with RN , patient  on 09/26/2020  Voice Recognition /Dragon dictation system was used to create this note, attempts have been made to correct errors. Please contact the author with questions and/or clarifications.   Florencia Reasons, MD PhD FACP [facility]  Available via Epic  secure chat 7am-7pm for nonurgent issues Please page for urgent issues To page the attending provider between 7A-7P or the covering provider during after hours 7P-7A, please log into the web site [email or web address] and access using universal [place] password for that web site. If you do not have the password, please call the hospital operator.    09/26/2020, 3:09 PM

## 2020-09-27 LAB — MAGNESIUM: Magnesium: 1.4 mg/dL — ABNORMAL LOW (ref 1.7–2.4)

## 2020-09-27 LAB — BASIC METABOLIC PANEL
Anion gap: 11 (ref 5–15)
BUN: <5 mg/dL — ABNORMAL LOW (ref 6–20)
CO2: 24 mmol/L (ref 22–32)
Calcium: 9.9 mg/dL (ref 8.9–10.3)
Chloride: 97 mmol/L — ABNORMAL LOW (ref 98–111)
Creatinine, Ser: 0.69 mg/dL (ref 0.44–1.00)
GFR, Estimated: >60 mL/min (ref >60)
Glucose, Bld: 127 mg/dL — ABNORMAL HIGH (ref 70–99)
Potassium: 3.1 mmol/L — ABNORMAL LOW (ref 3.5–5.1)
Sodium: 132 mmol/L — ABNORMAL LOW (ref 135–145)

## 2020-09-27 LAB — URINE CYTOLOGY ANCILLARY ONLY
Chlamydia: NEGATIVE
Comment: NEGATIVE
Comment: NORMAL
Neisseria Gonorrhea: NEGATIVE

## 2020-09-27 MED ORDER — METOPROLOL TARTRATE 12.5 MG HALF TABLET
12.5000 mg | ORAL_TABLET | Freq: Two times a day (BID) | ORAL | Status: DC
  Administered 2020-09-27: 12.5 mg via ORAL
  Filled 2020-09-27: qty 1

## 2020-09-27 MED ORDER — HALOPERIDOL LACTATE 5 MG/ML IJ SOLN: 5.0000 mg | Freq: Four times a day (QID) | INTRAMUSCULAR | Status: DC | PRN

## 2020-09-27 MED ORDER — POTASSIUM CHLORIDE ER 20 MEQ PO TBCR: 20.0000 meq | EXTENDED_RELEASE_TABLET | Freq: Every day | ORAL | 0 refills | Status: DC

## 2020-09-27 MED ORDER — NICOTINE POLACRILEX 2 MG MT GUM
2.0000 mg | CHEWING_GUM | OROMUCOSAL | Status: DC | PRN
  Administered 2020-09-27: 2 mg via ORAL
  Filled 2020-09-27 (×2): qty 1

## 2020-09-27 MED ORDER — METOPROLOL TARTRATE 5 MG/5ML IV SOLN: 5.0000 mg | INTRAVENOUS | Status: DC | PRN

## 2020-09-27 MED ORDER — MAGNESIUM SULFATE 2 GM/50ML IV SOLN
2.0000 g | Freq: Once | INTRAVENOUS | Status: AC
  Administered 2020-09-27: 2 g via INTRAVENOUS
  Filled 2020-09-27: qty 50

## 2020-09-27 MED ORDER — NICOTINE 14 MG/24HR TD PT24: 14.0000 mg | MEDICATED_PATCH | Freq: Every day | TRANSDERMAL | Status: DC

## 2020-09-27 MED ORDER — POTASSIUM CHLORIDE CRYS ER 20 MEQ PO TBCR
40.0000 meq | EXTENDED_RELEASE_TABLET | ORAL | Status: AC
  Administered 2020-09-27 (×2): 40 meq via ORAL
  Filled 2020-09-27 (×2): qty 2

## 2020-09-27 MED ORDER — MAGNESIUM 400 MG PO CAPS
400.0000 mg | ORAL_CAPSULE | Freq: Every day | ORAL | 0 refills | Status: DC
Start: 2020-09-27 — End: 2022-04-15

## 2020-09-27 MED ORDER — AMOXICILLIN-POT CLAVULANATE 875-125 MG PO TABS: 1.0000 | ORAL_TABLET | Freq: Two times a day (BID) | ORAL | 0 refills | Status: AC

## 2020-09-27 MED ORDER — LORAZEPAM 2 MG/ML IJ SOLN
2.0000 mg | Freq: Once | INTRAMUSCULAR | Status: DC
  Filled 2020-09-27: qty 1

## 2020-09-27 MED ORDER — HALOPERIDOL LACTATE 5 MG/ML IJ SOLN
2.0000 mg | Freq: Four times a day (QID) | INTRAMUSCULAR | Status: DC | PRN
  Filled 2020-09-27: qty 1

## 2020-09-27 MED ORDER — POTASSIUM CHLORIDE CRYS ER 20 MEQ PO TBCR: 40.0000 meq | EXTENDED_RELEASE_TABLET | ORAL | Status: DC

## 2020-09-27 NOTE — Progress Notes (Signed)
NAME:  Jessica Case, MRN:  [id number], DOB:  01/21/1981, LOS: 3 ADMISSION DATE:  09/23/2020, CONSULTATION DATE:  09/25/20 REFERRING MD:  Erlinda Hong- TRH, CHIEF COMPLAINT:  Agitation, ETOH w/d  Brief History   Admitted from prison with left sided CP and CAP, now with ETOH w/d and severe HTN.  History of present illness   Jessica Case is a 39 year old woman admitted from prison after developing left-sided pleuritic chest pain on 12/6.  She was having cough productive of green sputum.  No recent sick contacts, Covid negative.  Fully vaccinated for Covid.  She drinks about 40 ounces of beer daily, but denies daily use of liquor.  She smokes cigars, 1/day.  She denies illicit drug use.  No previous history of alcohol withdrawal.  She had a witnessed seizure in the ED early in the morning on 12/7, but none since.  Since admission she has had severe hypertension and difficult to control anxiety this afternoon.  Critical care has been consulted for assistance with ETOH withdrawal that is refractory to CIWA.  Past Medical History  Schizophrenia Alcohol abuse Hypertension-poorly controlled  Significant Hospital Events     Consults:  PCCM  Procedures:    Significant Diagnostic Tests:  CTA chest 12/7-dense left lower lobe pneumonia, no pleural effusion.  No PE.  Hepatomegaly.  Micro Data:  Covid negative  Antimicrobials:  Ceftriaxone 12/6> Doxycycline 12/6>  Interim history/subjective:  She wants to go home today. She is worried about work. She remains under policy custody this morning with an officer at bedside. She refused her valium overnight.   Objective   Blood pressure 134/90, pulse (!) 102, temperature 98.1 F (36.7 C), temperature source Oral, resp. rate (!) 31, height 5\' 1"  (1.549 m), weight 52 kg, last menstrual period 09/16/2020, SpO2 100 %.        Intake/Output Summary (Last 24 hours) at 09/27/2020 0747 Last data filed at 09/26/2020 1700 Gross per 24 hour  Intake 50.06 ml   Output 400 ml  Net -349.94 ml   Filed Weights   09/23/20 2122 09/24/20 0654  Weight: 49.9 kg 52 kg    Examination: General: middle aged woman in NAD HENT: [place]/AT, eyes anicteric Lungs: CTAB, breathing comfortably on RA, no conversational dyspnea. Cardiovascular: tachycardic, regular rhythm Abdomen: soft, NT, ND Extremities: no cyanosis or edema Neuro:awake, alert, answering questions appropriately Psych: agitated but redirectable. No attention to internal stimulation. Derm: no rashes  Resolved Hospital Problem list     Assessment & Plan:  Alcohol withdrawal, seizure at admission but not since -Continue CIWA. Recommend taper of scheduled valium. -Discontinued Precedex, which she has not needed in 24 hrs.  -With baseline schizophrenia, she may benefit from antipsychotics once acute withdrawal is resolved. -Continue supplemental vitamins  Essential hypertension, likely acutely worsened due to alcohol withdrawal -Con't losartan to 50mg  daily and amlodipine 10mg  daily -Con't HCTZ 25mg  daily -con't to monitor electrolytes -Magnesium and potassium repleted. Anticipate she will need potassium supplements long-term if stay on HCTZ  Elevated transaminases hyperbilirubinemia  -Maddrey discriminant index 3.4> a/w good prognosis, no need for steroids -Continue to monitor periodically  Left lower lobe community-acquired pneumonia.  Urinary antigens negative for Legionella and pneumococcus. -Needs Blactam and atypical coverage to complete 7 days total.  Tobacco abuse -Counseled on the importance of quitting smoking previously.    PCCM will sign off. Please call with questions.  Best practice (evaluated daily)   Per primary  Labs   CBC: Recent Labs  Lab 09/23/20 2204 09/24/20 0527  09/25/20 0256 09/26/20 0314  WBC 10.7* 11.5* 14.0* 9.7  NEUTROABS 9.2*  --   --   --   HGB 12.1 11.2* 11.8* 12.4  HCT 34.8* 31.4* 33.6* 35.9*  MCV 95.1 94.0 94.9 95.7  PLT 147* 135* 113*  122*    Basic Metabolic Panel: Recent Labs  Lab 09/23/20 2204 09/24/20 0527 09/25/20 0256 09/26/20 0314 09/27/20 0255  NA 134* 137 137 137 132*  K 4.1 3.2* 4.1 3.9 3.1*  CL 97* 98 105 104 97*  CO2 20* 24 20* 19* 24  GLUCOSE 109* 125* 67* 123* 127*  BUN <5* <5* 5* 5* <5*  CREATININE 0.53 0.60 0.61 0.54 0.69  CALCIUM 8.3* 8.2* 8.7* 9.1 9.9  MG  --  1.1* 1.8 1.6* 1.4*  PHOS  --  2.9  --   --   --    GFR: Estimated Creatinine Clearance: 70.5 mL/min (by C-G formula based on SCr of 0.69 mg/dL). Recent Labs  Lab 09/23/20 2204 09/24/20 0527 09/25/20 0256 09/26/20 0314  PROCALCITON  --  0.92 0.93  --   WBC 10.7* 11.5* 14.0* 9.7    Liver Function Tests: Recent Labs  Lab 09/23/20 2204 09/24/20 0527 09/25/20 0256 09/26/20 0314  AST 144* 116* 104* 68*  ALT 56* 54* 45* 43  ALKPHOS 137* 121 125 120  BILITOT 1.8* 1.9* 1.3* 1.4*  PROT 7.2 6.6 6.2* 6.2*  ALBUMIN 3.8 3.5 3.1* 3.0*   Recent Labs  Lab 09/23/20 2204  LIPASE 30   No results for input(s): AMMONIA in the last 168 hours.  ABG    Component Value Date/Time   HCO3 24.4 09/23/2020 2205   TCO2 23 05/15/2011 2056   ACIDBASEDEF 0.0 04/15/2020 2048   O2SAT 47.8 09/23/2020 2205     Coagulation Profile: Recent Labs  Lab 09/25/20 1756  INR 1.0    Cardiac Enzymes: No results for input(s): CKTOTAL, CKMB, CKMBINDEX, TROPONINI in the last 168 hours.  HbA1C: Hemoglobin A1C  Date/Time Value Ref Range Status  11/15/2014 02:19 PM 5.0  Final    CBG: No results for input(s): GLUCAP in the last 168 hours.    Jessica Hy, DO 09/27/20 9:17 AM [facility]

## 2020-09-27 NOTE — Plan of Care (Signed)
Patient trying to get out of bed, got tangled in her ankle handcuffs with her knee hitting the floor. She remains confused with poor impulse control. Wrist and posey restraints ordered.  Julian Hy, DO 09/27/20 2:35 PM [facility]

## 2020-09-27 NOTE — Progress Notes (Signed)
PROGRESS NOTE    Jessica Case  EZM:[id number] DOB: 1981-08-14 DOA: 09/23/2020 PCP: [name], NP    Chief Complaint  Patient presents with  . Chest Pain    Brief Narrative:  Jessica Case is a 39 y.o. female with medical history significant for asthma, schizophrenia, alcohol dependence, and hypertension, now presenting to the emergency department for evaluation of pleuritic chest pain and productive cough  She reports drinking four 40 ounce beers every day, but has been detained at a detention facility for the past day where she has not had any alcohol.  She reports history of a seizure once before in the setting of not drinking for a couple days.    Subjective:  She continues to have agitation, apparently she could not tolerate Precedex due to bradycardia,  she is started on valium protocol but refused to take Valium She is restless,  but interactive and oriented x3  Blood pressure improved, but has tachycardia had a short run of SVT well agitated Officer at bedside  Assessment & Plan:   Principal Problem:   Alcohol withdrawal seizure ([place]) Active Problems:   Essential hypertension   Paranoid schizophrenia ([place])   Asthma   CAP (community acquired pneumonia)   Alcohol dependence ([place])   Elevated LFTs  Lobar pneumonia with leukocytosis, no hypoxia -Presented with pleuritic chest pain, short of breath ,saturating 91% on room air, tachycardic to the 120s -CTA personally reviewed, negative for PE but concerning for lobar pneumonia left lower  -COVID-19 screening negative, MRSA screening negative, urine strep  pneumo and urine Legionella negative -wbc normalized ,continue Rocephin, DC doxycycline  Hypertension Not on meds for blood pressure at home Hypertension likely due to alcohol withdrawal  Lopressor discontinued due to bradycardia Was restarted on Norvasc Cozaar and HCTZ per critical care This morning she has tachycardia and short run of SVT, with low  magnesium and low potassium, will resume Lopressor, DC HCTZ, DC Norvasc, continue Cozaar  Alcohol withdrawal seizures -She developed generalized seizure in the ED was treated with Ativan. -Seizure precaution, CIWA protocol, continue folic acid and thiamine supplement -UDS negative -Alcohol level on presentation 125, report drink four 40 ounce of beer on several days of during the week, but report does not drink every day -Not able to tolerate Precedex drip  due to bradycardia, currently she is on Valium -Appreciate critical care input  Normocytic anemia /thrombocytopenia Likely from alcohol Improving on repeat CBC, monitor  LFT elevation Likely from alcohol, improving  hepatitis panel negative  Hypokalemia/hypomagnesemia Remain low, continue replace   QTC prolongation QTC 520 on presentation in the setting of tachycardia Repeat EKG, keep on telemetry Keep mag above 2, potassium above 4  Nonsustained SVT Schedule Lopressor   History of schizophrenia and asthma Not on medication at home     DVT prophylaxis: enoxaparin (LOVENOX) injection 40 mg Start: 09/24/20 1000   Code Status: Full Family Communication: Patient Disposition:   Status is: Inpatient   Dispo: The patient is from: Detention facility               Anticipated d/c is to: TBD              Anticipated d/c date is: Currently not medically ready to discharge                Consultants:   Critical care  Procedures:   None  Antimicrobials:   As above     Objective: Vitals:   09/27/20 0400 09/27/20 0500  09/27/20 0600 09/27/20 0700  BP: 114/78 120/77 111/83 134/90  Pulse: 98 (!) 104 (!) 102   Resp: 17 (!) 22 17 (!) 31  Temp: 98.1 F (36.7 C)     TempSrc: Oral     SpO2: 99% 100% 100%   Weight:      Height:        Intake/Output Summary (Last 24 hours) at 09/27/2020 0743 Last data filed at 09/26/2020 1700 Gross per 24 hour  Intake 50.06 ml  Output 400 ml  Net -349.94 ml   Filed  Weights   09/23/20 2122 09/24/20 0654  Weight: 49.9 kg 52 kg    Examination:  General exam: Restless, shaky,  Currently aaox3 Respiratory system: Clear to auscultation. Respiratory effort normal. Cardiovascular system: S1 & S2 heard, RRR. No JVD, no murmur, No pedal edema. Gastrointestinal system: Abdomen is nondistended, soft and nontender.  Normal bowel sounds heard. Central nervous system: Alert and oriented. No focal neurological deficits. Extremities: Symmetric 5 x 5 power. Skin: No rashes, lesions or ulcers Psychiatry: Agitated    Data Reviewed: I have personally reviewed following labs and imaging studies  CBC: Recent Labs  Lab 09/23/20 2204 09/24/20 0527 09/25/20 0256 09/26/20 0314  WBC 10.7* 11.5* 14.0* 9.7  NEUTROABS 9.2*  --   --   --   HGB 12.1 11.2* 11.8* 12.4  HCT 34.8* 31.4* 33.6* 35.9*  MCV 95.1 94.0 94.9 95.7  PLT 147* 135* 113* 122*    Basic Metabolic Panel: Recent Labs  Lab 09/23/20 2204 09/24/20 0527 09/25/20 0256 09/26/20 0314 09/27/20 0255  NA 134* 137 137 137 132*  K 4.1 3.2* 4.1 3.9 3.1*  CL 97* 98 105 104 97*  CO2 20* 24 20* 19* 24  GLUCOSE 109* 125* 67* 123* 127*  BUN <5* <5* 5* 5* <5*  CREATININE 0.53 0.60 0.61 0.54 0.69  CALCIUM 8.3* 8.2* 8.7* 9.1 9.9  MG  --  1.1* 1.8 1.6* 1.4*  PHOS  --  2.9  --   --   --     GFR: Estimated Creatinine Clearance: 70.5 mL/min (by C-G formula based on SCr of 0.69 mg/dL).  Liver Function Tests: Recent Labs  Lab 09/23/20 2204 09/24/20 0527 09/25/20 0256 09/26/20 0314  AST 144* 116* 104* 68*  ALT 56* 54* 45* 43  ALKPHOS 137* 121 125 120  BILITOT 1.8* 1.9* 1.3* 1.4*  PROT 7.2 6.6 6.2* 6.2*  ALBUMIN 3.8 3.5 3.1* 3.0*    CBG: No results for input(s): GLUCAP in the last 168 hours.   Recent Results (from the past 240 hour(s))  Resp Panel by RT-PCR (Flu A&B, Covid) Nasopharyngeal Swab     Status: None   Collection Time: 09/23/20 10:58 PM   Specimen: Nasopharyngeal Swab;  Nasopharyngeal(NP) swabs in vial transport medium  Result Value Ref Range Status   SARS Coronavirus 2 by RT PCR NEGATIVE NEGATIVE Final    Comment: (NOTE) SARS-CoV-2 target nucleic acids are NOT DETECTED.  The SARS-CoV-2 RNA is generally detectable in upper respiratory specimens during the acute phase of infection. The lowest concentration of SARS-CoV-2 viral copies this assay can detect is 138 copies/mL. A negative result does not preclude SARS-Cov-2 infection and should not be used as the sole basis for treatment or other patient management decisions. A negative result may occur with  improper specimen collection/handling, submission of specimen other than nasopharyngeal swab, presence of viral mutation(s) within the areas targeted by this assay, and inadequate number of viral copies(<138 copies/mL). A negative result  must be combined with clinical observations, patient history, and epidemiological information. The expected result is Negative.  Fact Sheet for Patients:  EntrepreneurPulse.com.au  Fact Sheet for Healthcare Providers:  IncredibleEmployment.be  This test is no t yet approved or cleared by the [place] FDA and  has been authorized for detection and/or diagnosis of SARS-CoV-2 by FDA under an Emergency Use Authorization (EUA). This EUA will remain  in effect (meaning this test can be used) for the duration of the COVID-19 declaration under Section 564(b)(1) of the Act, 21 U.S.C.section 360bbb-3(b)(1), unless the authorization is terminated  or revoked sooner.       Influenza A by PCR NEGATIVE NEGATIVE Final   Influenza B by PCR NEGATIVE NEGATIVE Final    Comment: (NOTE) The Xpert Xpress SARS-CoV-2/FLU/RSV plus assay is intended as an aid in the diagnosis of influenza from Nasopharyngeal swab specimens and should not be used as a sole basis for treatment. Nasal washings and aspirates are unacceptable for Xpert Xpress  SARS-CoV-2/FLU/RSV testing.  Fact Sheet for Patients: EntrepreneurPulse.com.au  Fact Sheet for Healthcare Providers: IncredibleEmployment.be  This test is not yet approved or cleared by the [place] FDA and has been authorized for detection and/or diagnosis of SARS-CoV-2 by FDA under an Emergency Use Authorization (EUA). This EUA will remain in effect (meaning this test can be used) for the duration of the COVID-19 declaration under Section 564(b)(1) of the Act, 21 U.S.C. section 360bbb-3(b)(1), unless the authorization is terminated or revoked.  Performed at [facility], [place] [address] [place], [place] [address]   MRSA PCR Screening     Status: None   Collection Time: 09/24/20  6:31 AM   Specimen: Nasopharyngeal  Result Value Ref Range Status   MRSA by PCR NEGATIVE NEGATIVE Final    Comment:        The GeneXpert MRSA Assay (FDA approved for NASAL specimens only), is one component of a comprehensive MRSA colonization surveillance program. It is not intended to diagnose MRSA infection nor to guide or monitor treatment for MRSA infections. Performed at [facility], [facility] [address], [place], [place] [address]          Radiology Studies: No results found.      Scheduled Meds: . amLODipine  10 mg Oral Daily  . Chlorhexidine Gluconate Cloth  6 each Topical Daily  . diazepam  5 mg Oral Q8H  . enoxaparin (LOVENOX) injection  40 mg Subcutaneous Q24H  . folic acid  1 mg Oral Daily  . hydrochlorothiazide  25 mg Oral Daily  . losartan  50 mg Oral Daily  . mouth rinse  15 mL Mouth Rinse BID  . multivitamin with minerals  1 tablet Oral Daily  . potassium chloride  40 mEq Oral Q4H  . sodium chloride flush  3 mL Intravenous Q12H  . thiamine  100 mg Oral Daily   Or  . thiamine  100 mg Intravenous Daily   Continuous Infusions: . cefTRIAXone (ROCEPHIN)  IV Stopped (09/26/20 2207)  .  dexmedetomidine (PRECEDEX) IV infusion Stopped (09/26/20 0750)  . magnesium sulfate bolus IVPB       LOS: 3 days   Time spent: 26mins Greater than 50% of this time was spent in counseling, explanation of diagnosis, planning of further management, and coordination of care.  I have personally reviewed and interpreted on  09/27/2020 daily labs, tele strips, imagings as discussed above under date review session and assessment and plans.  I reviewed all nursing notes,  pharmacy notes,  vitals, pertinent old records  I have discussed plan of care as described above with RN , patient  on 09/27/2020  Voice Recognition /Dragon dictation system was used to create this note, attempts have been made to correct errors. Please contact the author with questions and/or clarifications.   Florencia Reasons, MD PhD FACP [facility]  Available via Epic secure chat 7am-7pm for nonurgent issues Please page for urgent issues To page the attending provider between 7A-7P or the covering provider during after hours 7P-7A, please log into the web site [email or web address] and access using universal [facility] password for that web site. If you do not have the password, please call the hospital operator.    09/27/2020, 7:43 AM

## 2020-09-27 NOTE — Progress Notes (Signed)
Patient growing increasingly agitated having to stay in the hospital despite being released from police custody. Patient attempted to get up and leave the room with officers still in the room and feet cuffed to the bed. Patient consequently fell in the floor and bumped her right knee, leaving a small abrasion. Patient was assisted back to bed and assessed by CCM and nursing staff. Explained to patient that she was withdrawing from alcohol, and while she has moments of clarity, she has remained inappropriate to situation frequently throughout this admission. Placed bilateral hand and foot soft restraints and posey belt to keep patient in bed. Patient stated that we cannot hold her against her will. Informed patient that she was absolutely correct and we would only hold someone against their will if they were a danger to themselves or others. Patient states that she is not a danger to herself and she "knows her body" we explained the high risk for seizures that could result in death if she left then hospital while withdrawing from alcohol. Patient states that she is not withdrawing, will not withdraw, and still wants to leave. Answers all orientation questions correctly. Assessed by Dr. Carlis Abbott and Dr. Erlinda Hong for possible IVC needs. MDs decided that she had the capacity to make her own medical decisions. Restraints removed and IV removed. Patient allowed to walk downstairs after she was assisted into paper scrubs and given her belongings. States that her father is waiting to pick her up.

## 2020-09-27 NOTE — Progress Notes (Signed)
Chaplain engaged in initial visit with Jessica Case and the officer in her room.  Jessica Case voiced wanting to be able to use the phone to call her family about putting money up for her bond.  The officer voiced that she is unable to utilize a phone because of being in custody until her family presents the right paperwork for her to have her bond paid.  The officer also relayed to Jessica Case that if she leaves the hospital and goes back to jail she will be able to use a phone there.  Chaplain worked to affirm Building control surveyor was detailing to [employer].  Family does know her bond amount and have already began that process of trying to get her out per the officer.  Chaplain offered support and will follow-up as needed.    09/27/20 1400  Clinical Encounter Type  Visited With Patient  Visit Type Initial

## 2020-09-27 NOTE — Progress Notes (Addendum)
Police officer at bedside yelled out for assistance. Pt became aggressive and combative with the officer. Pt tangled herself and pushed her way out of bed. Her right knee hit the floor. RN arrived to room and assisted patient back to bed. Engineer, structural then [employer]. Dr. Carlis Abbott was in hallway and witnessed the event. Orders received for posey belt and soft wrist restraints. Dr. Erlinda Hong was also made aware of the events and is to come and see the patient.

## 2020-09-27 NOTE — Progress Notes (Addendum)
Dr. Carlis Abbott spoke with patient and explained why she needed to stay in the hospital. Dr. Erlinda Hong also came to the room to discuss the importance completing her hospital treatment. Both MDs were in agreement that the patient was answering appropriate orientation questions and was capable of making her own decisions. The patient's mother was called, with her permission, in attempt to convince her to stay. Patient declined all attempts and insisted on leaving AMA. She states, "I am leaving tonight and my dad will pick me up." Pt signed AMA paperwork, IV was removed, pt provided with paper scrubs. Pt also given instructions via Dr. Erlinda Hong to not operate a vehicle. Pt gathered belongings that the police officer had returned to her and left the unit at 1700.

## 2020-09-28 NOTE — Discharge Summary (Signed)
Discharge Summary  Jessica Case ZJQ:[id number] DOB: [birth year]  PCP: [name], NP  Admit date: 09/23/2020 Discharge date: 09/28/2020  Patient left hospital against medical advise Advise stop drinking alcohol Advise no driving for 6 months, both patient and mother made aware Patient provided her pharmacy information, prescription sent to her pharmacy   Recommendations for Outpatient Follow-up:  1. F/u with PCP within a week  for hospital discharge follow up, repeat cbc/bmp at follow up   Discharge Diagnoses:  Active Hospital Problems   Diagnosis Date Noted  . Alcohol withdrawal seizure ([place]) 09/24/2020  . CAP (community acquired pneumonia) 09/24/2020  . Alcohol dependence ([place]) 09/24/2020  . Elevated LFTs 09/24/2020  . Asthma 04/15/2020  . Paranoid schizophrenia ([place]) 10/31/2017  . Essential hypertension 10/08/2016    Resolved Hospital Problems  No resolved problems to display.    Filed Weights   09/23/20 2122 09/24/20 0654  Weight: 49.9 kg 52 kg    History of present illness:  *  Hospital Course:  Principal Problem:   Alcohol withdrawal seizure ([place]) Active Problems:   Essential hypertension   Paranoid schizophrenia ([place])   Asthma   CAP (community acquired pneumonia)   Alcohol dependence ([place])   Elevated LFTs   Procedures:  *  Consultations:  *  Discharge Exam: BP (!) 170/103   Pulse 99   Temp 98 F (36.7 C) (Oral)   Resp 19   Ht 5\' 1"  (1.549 m)   Wt 52 kg   LMP 09/16/2020 Comment: negative HCG quantitative 09/23/20  SpO2 100%   BMI 21.66 kg/m   General: * Cardiovascular: * Respiratory: *    Allergies as of 09/27/2020      Reactions   Citrus Hives, Itching   Food Hives, Itching, Other (See Comments)   Pt confirmed that it is not an anaphylactic reaction   Strawberry Flavor    Hives all over body.    Toradol [ketorolac Tromethamine] Hives, Itching      Medication List    TAKE these medications    amoxicillin-clavulanate 875-125 MG tablet Commonly known as: Augmentin Take 1 tablet by mouth 2 (two) times daily for 4 days.   Magnesium 400 MG Caps Take 400 mg by mouth daily.   Potassium Chloride ER 20 MEQ Tbcr Take 20 mEq by mouth daily for 7 days.     ASK your doctor about these medications   loratadine 10 MG tablet Commonly known as: CLARITIN Take 1 tablet (10 mg total) by mouth daily.   olopatadine 0.1 % ophthalmic solution Commonly known as: PATANOL Place 1 drop into both eyes 2 (two) times daily. For itchy eyes   predniSONE 20 MG tablet Commonly known as: DELTASONE Take 2 tablets (40 mg total) by mouth daily with breakfast.   Vitafol Ultra 29-0.6-0.4-200 MG Caps Take 1 tablet by mouth daily.      Allergies  Allergen Reactions  . Citrus Hives and Itching  . Food Hives, Itching and Other (See Comments)    Pt confirmed that it is not an anaphylactic reaction  . Strawberry Flavor     Hives all over body.   . Toradol [Ketorolac Tromethamine] Hives and Itching    Follow-up Information    [name], NP Follow up in 1 week(s).   Specialty: Nurse Practitioner Why: follow up in one week, repeat potassium and mag level Contact information: [place] [address] [phone]  The results of significant diagnostics from this hospitalization (including imaging, microbiology, ancillary and laboratory) are listed below for reference.    Significant Diagnostic Studies: CT Angio Chest PE W/Cm &/Or Wo Cm  Result Date: 09/24/2020 CLINICAL DATA:  Left-sided chest pain EXAM: CT ANGIOGRAPHY CHEST WITH CONTRAST TECHNIQUE: Multidetector CT imaging of the chest was performed using the standard protocol during bolus administration of intravenous contrast. Multiplanar CT image reconstructions and MIPs were obtained to evaluate the vascular anatomy. CONTRAST:  122mL OMNIPAQUE IOHEXOL 350 MG/ML SOLN COMPARISON:  None. FINDINGS:  Cardiovascular: There is a optimal opacification of the pulmonary arteries. There is no central,segmental, or subsegmental filling defects within the pulmonary arteries. The heart is normal in size. No pericardial effusion or thickening. No evidence right heart strain. There is normal three-vessel brachiocephalic anatomy without proximal stenosis. The thoracic aorta is normal in appearance. Mediastinum/Nodes: No hilar, mediastinal, or axillary adenopathy. Thyroid gland, trachea, and esophagus demonstrate no significant findings. Lungs/Pleura: Large area of airspace consolidation seen at the posterior left lung base with air bronchograms. Streaky airspace opacity at the right lung base. No pleural effusion. Upper Abdomen: No acute abnormalities present in the visualized portions of the upper abdomen. Musculoskeletal: No chest wall abnormality. No acute or significant osseous findings. Review of the MIP images confirms the above findings. Abdomen/pelvis: Hepatobiliary: There is diffuse low density seen throughout the liver parenchyma. No focal hepatic lesion is seen. No intra or extrahepatic biliary ductal dilatation. The main portal vein is patent. No evidence of calcified gallstones, gallbladder wall thickening or biliary dilatation. Pancreas: Unremarkable. No pancreatic ductal dilatation or surrounding inflammatory changes. Spleen: Normal in size without focal abnormality. Adrenals/Urinary Tract: Both adrenal glands appear normal. The kidneys and collecting system appear normal without evidence of urinary tract calculus or hydronephrosis. Bladder is unremarkable. Stomach/Bowel: The stomach, small bowel, and colon are normal in appearance. No inflammatory changes, wall thickening, or obstructive findings.The appendix is normal. Vascular/Lymphatic: There are no enlarged mesenteric, retroperitoneal, or pelvic lymph nodes. No significant vascular findings are present. Reproductive: Again noted is a partially exophytic  right uterine fibroid. A trace amount of free fluid seen within the cul-de-sac. Other: No evidence of abdominal wall mass or hernia. Musculoskeletal: No acute or significant osseous findings. IMPRESSION: No central, segmental, or subsegmental pulmonary embolism. Large area of airspace consolidation in the posterior lower lobe likely consistent with pneumonia. Hepatic steatosis Uterine fibroid Electronically Signed   By: Prudencio Pair M.D.   On: 09/24/2020 02:13   CT Abdomen Pelvis W Contrast  Result Date: 09/24/2020 CLINICAL DATA:  Left-sided chest pain EXAM: CT ANGIOGRAPHY CHEST WITH CONTRAST TECHNIQUE: Multidetector CT imaging of the chest was performed using the standard protocol during bolus administration of intravenous contrast. Multiplanar CT image reconstructions and MIPs were obtained to evaluate the vascular anatomy. CONTRAST:  170mL OMNIPAQUE IOHEXOL 350 MG/ML SOLN COMPARISON:  None. FINDINGS: Cardiovascular: There is a optimal opacification of the pulmonary arteries. There is no central,segmental, or subsegmental filling defects within the pulmonary arteries. The heart is normal in size. No pericardial effusion or thickening. No evidence right heart strain. There is normal three-vessel brachiocephalic anatomy without proximal stenosis. The thoracic aorta is normal in appearance. Mediastinum/Nodes: No hilar, mediastinal, or axillary adenopathy. Thyroid gland, trachea, and esophagus demonstrate no significant findings. Lungs/Pleura: Large area of airspace consolidation seen at the posterior left lung base with air bronchograms. Streaky airspace opacity at the right lung base. No pleural effusion. Upper Abdomen: No acute abnormalities present in the visualized portions of  the upper abdomen. Musculoskeletal: No chest wall abnormality. No acute or significant osseous findings. Review of the MIP images confirms the above findings. Abdomen/pelvis: Hepatobiliary: There is diffuse low density seen throughout  the liver parenchyma. No focal hepatic lesion is seen. No intra or extrahepatic biliary ductal dilatation. The main portal vein is patent. No evidence of calcified gallstones, gallbladder wall thickening or biliary dilatation. Pancreas: Unremarkable. No pancreatic ductal dilatation or surrounding inflammatory changes. Spleen: Normal in size without focal abnormality. Adrenals/Urinary Tract: Both adrenal glands appear normal. The kidneys and collecting system appear normal without evidence of urinary tract calculus or hydronephrosis. Bladder is unremarkable. Stomach/Bowel: The stomach, small bowel, and colon are normal in appearance. No inflammatory changes, wall thickening, or obstructive findings.The appendix is normal. Vascular/Lymphatic: There are no enlarged mesenteric, retroperitoneal, or pelvic lymph nodes. No significant vascular findings are present. Reproductive: Again noted is a partially exophytic right uterine fibroid. A trace amount of free fluid seen within the cul-de-sac. Other: No evidence of abdominal wall mass or hernia. Musculoskeletal: No acute or significant osseous findings. IMPRESSION: No central, segmental, or subsegmental pulmonary embolism. Large area of airspace consolidation in the posterior lower lobe likely consistent with pneumonia. Hepatic steatosis Uterine fibroid Electronically Signed   By: Prudencio Pair M.D.   On: 09/24/2020 02:13   DG Chest Port 1 View  Result Date: 09/23/2020 CLINICAL DATA:  Chest pain EXAM: PORTABLE CHEST 1 VIEW COMPARISON:  04/15/2020 FINDINGS: Right lung is clear. Airspace disease in the left lower lobe. Normal cardiomediastinal silhouette. No pneumothorax. IMPRESSION: Left lower lobe pneumonia. Electronically Signed   By: Donavan Foil M.D.   On: 09/23/2020 22:44    Microbiology: Recent Results (from the past 240 hour(s))  Resp Panel by RT-PCR (Flu A&B, Covid) Nasopharyngeal Swab     Status: None   Collection Time: 09/23/20 10:58 PM   Specimen:  Nasopharyngeal Swab; Nasopharyngeal(NP) swabs in vial transport medium  Result Value Ref Range Status   SARS Coronavirus 2 by RT PCR NEGATIVE NEGATIVE Final    Comment: (NOTE) SARS-CoV-2 target nucleic acids are NOT DETECTED.  The SARS-CoV-2 RNA is generally detectable in upper respiratory specimens during the acute phase of infection. The lowest concentration of SARS-CoV-2 viral copies this assay can detect is 138 copies/mL. A negative result does not preclude SARS-Cov-2 infection and should not be used as the sole basis for treatment or other patient management decisions. A negative result may occur with  improper specimen collection/handling, submission of specimen other than nasopharyngeal swab, presence of viral mutation(s) within the areas targeted by this assay, and inadequate number of viral copies(<138 copies/mL). A negative result must be combined with clinical observations, patient history, and epidemiological information. The expected result is Negative.  Fact Sheet for Patients:  EntrepreneurPulse.com.au  Fact Sheet for Healthcare Providers:  IncredibleEmployment.be  This test is no t yet approved or cleared by the [place] FDA and  has been authorized for detection and/or diagnosis of SARS-CoV-2 by FDA under an Emergency Use Authorization (EUA). This EUA will remain  in effect (meaning this test can be used) for the duration of the COVID-19 declaration under Section 564(b)(1) of the Act, 21 U.S.C.section 360bbb-3(b)(1), unless the authorization is terminated  or revoked sooner.       Influenza A by PCR NEGATIVE NEGATIVE Final   Influenza B by PCR NEGATIVE NEGATIVE Final    Comment: (NOTE) The Xpert Xpress SARS-CoV-2/FLU/RSV plus assay is intended as an aid in the diagnosis of influenza from Nasopharyngeal swab  specimens and should not be used as a sole basis for treatment. Nasal washings and aspirates are unacceptable for  Xpert Xpress SARS-CoV-2/FLU/RSV testing.  Fact Sheet for Patients: EntrepreneurPulse.com.au  Fact Sheet for Healthcare Providers: IncredibleEmployment.be  This test is not yet approved or cleared by the [place] FDA and has been authorized for detection and/or diagnosis of SARS-CoV-2 by FDA under an Emergency Use Authorization (EUA). This EUA will remain in effect (meaning this test can be used) for the duration of the COVID-19 declaration under Section 564(b)(1) of the Act, 21 U.S.C. section 360bbb-3(b)(1), unless the authorization is terminated or revoked.  Performed at [facility], [place] [address] [place], [place] [address]   MRSA PCR Screening     Status: None   Collection Time: 09/24/20  6:31 AM   Specimen: Nasopharyngeal  Result Value Ref Range Status   MRSA by PCR NEGATIVE NEGATIVE Final    Comment:        The GeneXpert MRSA Assay (FDA approved for NASAL specimens only), is one component of a comprehensive MRSA colonization surveillance program. It is not intended to diagnose MRSA infection nor to guide or monitor treatment for MRSA infections. Performed at [facility], [place] [address], [place], [place] [address]      Labs: Basic Metabolic Panel: Recent Labs  Lab 09/23/20 2204 09/24/20 0527 09/25/20 0256 09/26/20 0314 09/27/20 0255  NA 134* 137 137 137 132*  K 4.1 3.2* 4.1 3.9 3.1*  CL 97* 98 105 104 97*  CO2 20* 24 20* 19* 24  GLUCOSE 109* 125* 67* 123* 127*  BUN <5* <5* 5* 5* <5*  CREATININE 0.53 0.60 0.61 0.54 0.69  CALCIUM 8.3* 8.2* 8.7* 9.1 9.9  MG  --  1.1* 1.8 1.6* 1.4*  PHOS  --  2.9  --   --   --    Liver Function Tests: Recent Labs  Lab 09/23/20 2204 09/24/20 0527 09/25/20 0256 09/26/20 0314  AST 144* 116* 104* 68*  ALT 56* 54* 45* 43  ALKPHOS 137* 121 125 120  BILITOT 1.8* 1.9* 1.3* 1.4*  PROT 7.2 6.6 6.2* 6.2*  ALBUMIN 3.8 3.5 3.1* 3.0*   Recent  Labs  Lab 09/23/20 2204  LIPASE 30   No results for input(s): AMMONIA in the last 168 hours. CBC: Recent Labs  Lab 09/23/20 2204 09/24/20 0527 09/25/20 0256 09/26/20 0314  WBC 10.7* 11.5* 14.0* 9.7  NEUTROABS 9.2*  --   --   --   HGB 12.1 11.2* 11.8* 12.4  HCT 34.8* 31.4* 33.6* 35.9*  MCV 95.1 94.0 94.9 95.7  PLT 147* 135* 113* 122*   Cardiac Enzymes: No results for input(s): CKTOTAL, CKMB, CKMBINDEX, TROPONINI in the last 168 hours. BNP: BNP (last 3 results) No results for input(s): BNP in the last 8760 hours.  ProBNP (last 3 results) No results for input(s): PROBNP in the last 8760 hours.  CBG: No results for input(s): GLUCAP in the last 168 hours.     Signed:  Florencia Reasons MD, PhD, FACP  [facility] 09/28/2020, 7:08 PM

## 2020-09-30 ENCOUNTER — Telehealth: Payer: Self-pay

## 2020-09-30 NOTE — Telephone Encounter (Signed)
Transition Care Management Unsuccessful Follow-up Telephone Call  Date of discharge and from where:  09/27/2020, [facility]  - left AMA  Attempts:  Unable to contact the patient. [place] [facility] [place] [facility]. No contacts noted. Per the clinical notes, she was released from custody prior to leaving the hospital   Reason for unsuccessful TCM follow-up call:  Unable to reach patient

## 2020-10-01 ENCOUNTER — Emergency Department (HOSPITAL_COMMUNITY): Payer: Medicaid Other

## 2020-10-01 ENCOUNTER — Encounter (HOSPITAL_COMMUNITY): Payer: Self-pay

## 2020-10-01 ENCOUNTER — Emergency Department (HOSPITAL_COMMUNITY)
Admission: EM | Admit: 2020-10-01 | Discharge: 2020-10-02 | Disposition: A | Discharge status: Home / Self Care | Payer: Medicaid Other | Attending: Emergency Medicine | Admitting: Emergency Medicine

## 2020-10-01 ENCOUNTER — Telehealth: Payer: Self-pay

## 2020-10-01 DIAGNOSIS — I10 Essential (primary) hypertension: Secondary | ICD-10-CM | POA: Diagnosis not present

## 2020-10-01 DIAGNOSIS — S52022A Displaced fracture of olecranon process without intraarticular extension of left ulna, initial encounter for closed fracture: Secondary | ICD-10-CM | POA: Diagnosis not present

## 2020-10-01 DIAGNOSIS — S0990XA Unspecified injury of head, initial encounter: Secondary | ICD-10-CM | POA: Insufficient documentation

## 2020-10-01 DIAGNOSIS — S60212A Contusion of left wrist, initial encounter: Secondary | ICD-10-CM | POA: Diagnosis not present

## 2020-10-01 DIAGNOSIS — S30810A Abrasion of lower back and pelvis, initial encounter: Secondary | ICD-10-CM | POA: Diagnosis not present

## 2020-10-01 DIAGNOSIS — F17210 Nicotine dependence, cigarettes, uncomplicated: Secondary | ICD-10-CM | POA: Insufficient documentation

## 2020-10-01 DIAGNOSIS — W108XXA Fall (on) (from) other stairs and steps, initial encounter: Secondary | ICD-10-CM | POA: Diagnosis not present

## 2020-10-01 DIAGNOSIS — J45909 Unspecified asthma, uncomplicated: Secondary | ICD-10-CM | POA: Insufficient documentation

## 2020-10-01 DIAGNOSIS — W19XXXA Unspecified fall, initial encounter: Secondary | ICD-10-CM

## 2020-10-01 DIAGNOSIS — S59902A Unspecified injury of left elbow, initial encounter: Secondary | ICD-10-CM | POA: Diagnosis present

## 2020-10-01 DIAGNOSIS — Y92410 Unspecified street and highway as the place of occurrence of the external cause: Secondary | ICD-10-CM | POA: Insufficient documentation

## 2020-10-01 MED ORDER — FENTANYL CITRATE (PF) 100 MCG/2ML IJ SOLN
50.0000 ug | Freq: Once | INTRAMUSCULAR | Status: AC
Start: 2020-10-02 — End: 2020-10-02
  Administered 2020-10-02: 50 ug via INTRAMUSCULAR
  Filled 2020-10-01: qty 2

## 2020-10-01 NOTE — ED Triage Notes (Addendum)
Pt arrives via EMS after falling on the steps tonight. She is now reporting left arm pain and rib pain. Swelling and limited movement noted to arm from wrist up to elbow as well as bruising. Pt reports drinking 1 beer tonight.   EMS vitals:  CBG 108 BP 122/78 HR 74 SPO2 92% RR 18

## 2020-10-01 NOTE — Telephone Encounter (Signed)
Transition Care Management Unsuccessful Follow-up Telephone Call   Date of discharge and from where:  09/27/2020, [facility]  - left AMA  Attempts:  Unable to contact the patient. Her address and phone number [facility] the jail. No contacts noted. Per the clinical notes, she was released from custody prior to leaving the hospital.  Call placed to a cell phone # [phone] and the message stated that the number is not in service.   Reason for unsuccessful TCM follow-up call: Unable to reach patient  She has no upcoming appointments scheduled.

## 2020-10-01 NOTE — ED Provider Notes (Signed)
[facility] DEPT Provider Note   CSN: [id number] Arrival date & time: 10/01/20  2225     History Chief Complaint  Patient presents with  . Fall    Jessica Case is a 39 y.o. female with a history of paranoid schizophrenia, cocaine use disorder, alcohol dependence who presents to the emergency department with a chief complaint of fall.  The patient states that she fell yesterday after being discharged from the hospital.  Reports that she was crossing the street and braced her fall with her left elbow.  She also reports hitting her left hip on the ground.  She was able to stand up and ambulate after the fall.  She then states that she attempted to walk up the side of the hill and fell again and required assistance from security at the hospital to help her up.  She reports that she has been ambulatory since the second fall.  She states that she did hit her head, but denies LOC, nausea, or vomiting.  In the ER, she is endorsing numbness in the first and second digits of the left hand as well as decreased sensation in all 5 digits.  She is reporting severe pain in her left elbow that is worse with movement.  Pain is improved with holding her left elbow still and worse with movement.  She characterizes the pain as aching and 7 out of 10.  She took Tylenol earlier today for her symptoms.  She is also endorsing left wrist pain.  There is associated bruising noted to her left wrist and to the left elbow.  She denies left shoulder pain.  Denies neck pain, left knee or ankle pain.  No abdominal pain, chest pain, shortness of breath, or back pain.  However, per her medical record, the patient left AMA on 12/10 for admission for community-acquired pneumonia hands alcohol withdrawal seizure.   Reports that she last ate and drank at approximately 1500.  She does endorse drinking 1 beer earlier tonight.  The history is provided by the patient and medical records. No  language interpreter was used.       Past Medical History:  Diagnosis Date  . Arthritis   . Claustrophobia   . Cold    currently - no meds  . Fallopian tube abscess   . Fibroids   . Paranoid schizophrenia ([place])   . SVD (spontaneous vaginal delivery)    x 1    Patient Active Problem List   Diagnosis Date Noted  . Alcohol withdrawal seizure ([place]) 09/24/2020  . CAP (community acquired pneumonia) 09/24/2020  . Alcohol dependence ([place]) 09/24/2020  . Elevated LFTs 09/24/2020  . Asthma 04/15/2020  . Paranoid schizophrenia ([place]) 10/31/2017  . Right foot pain 05/11/2017  . Essential hypertension 10/08/2016  . Cocaine substance abuse ([place]) 10/08/2016  . Fibroids, subserous 08/18/2016  . Hydrosalpinx 04/13/2016  . Tobacco dependency 09/04/2015  . Migraine with status migrainosus 08/15/2015  . Allergic rhinitis 07/18/2015  . Pain due to dental caries 07/12/2014    Past Surgical History:  Procedure Laterality Date  . BUNIONECTOMY    . bunyonectomy Bilateral    x 1  . LAPAROSCOPIC UNILATERAL SALPINGECTOMY Left 08/18/2016   Procedure: LAPAROSCOPIC UNILATERAL SALPINGECTOMY;  Surgeon: Lavonia Drafts, MD;  Location: [facility];  Service: Gynecology;  Laterality: Left;  . LAPAROSCOPY  08/18/2016   Procedure: LAPAROSCOPY OPERATIVE;  Surgeon: Lavonia Drafts, MD;  Location: [facility];  Service: Gynecology;;  . LAPAROTOMY  08/18/2016   Procedure: LAPAROTOMY;  Surgeon: Lavonia Drafts, MD;  Location: [facility];  Service: Gynecology;;  mini laparotomy  . MYOMECTOMY  08/18/2016   Procedure: MYOMECTOMY;  Surgeon: Lavonia Drafts, MD;  Location: [facility];  Service: Gynecology;;  . TOOTH EXTRACTION       OB History    Gravida  2   Para  1   Term  0   Preterm  1   AB  1   Living  0     SAB  1   IAB  0   Ectopic      Multiple      Live Births              Family History  Problem Relation Age of Onset  . Hypertension Mother     Social History    Tobacco Use  . Smoking status: Current Every Day Smoker    Packs/day: 0.10    Years: 23.00    Pack years: 2.30    Types: Cigarettes  . Smokeless tobacco: Never Used  Vaping Use  . Vaping Use: Never used  Substance Use Topics  . Alcohol use: Yes    Alcohol/week: 0.0 standard drinks    Comment: beer 40 oz /week  . Drug use: Not Currently    Types: Marijuana, "Crack" cocaine    Home Medications Prior to Admission medications   Medication Sig Start Date End Date Taking? Authorizing Provider  Magnesium 400 MG CAPS Take 400 mg by mouth daily. 09/27/20   Florencia Reasons, MD  Potassium Chloride ER 20 MEQ TBCR Take 20 mEq by mouth daily for 7 days. 09/27/20 10/04/20  Florencia Reasons, MD  loratadine (CLARITIN) 10 MG tablet Take 1 tablet (10 mg total) by mouth daily. Patient not taking: Reported on 04/15/2020 01/29/20 10/02/20  [name], NP    Allergies    Citrus, Food, Strawberry flavor, and Toradol [ketorolac tromethamine]  Review of Systems   Review of Systems  Constitutional: Negative for activity change.  Respiratory: Negative for shortness of breath.   Cardiovascular: Negative for chest pain.  Gastrointestinal: Negative for abdominal pain.  Genitourinary: Negative for dysuria.  Musculoskeletal: Positive for arthralgias and myalgias. Negative for back pain, gait problem, joint swelling, neck pain and neck stiffness.  Skin: Positive for wound. Negative for color change and rash.  Allergic/Immunologic: Negative for immunocompromised state.  Neurological: Positive for weakness and numbness. Negative for dizziness and headaches.  Psychiatric/Behavioral: Negative for confusion.    Physical Exam Updated Vital Signs BP (!) 124/91 (BP Location: Right Arm)   Pulse (!) 102   Temp 99 F (37.2 C) (Oral)   Resp 17   LMP 09/02/2020 (Approximate) Comment: waiver signed,neg preg test 12/6  SpO2 97%   Physical Exam Vitals and nursing note reviewed.  Constitutional:      General: She is  not in acute distress.    Appearance: She is not ill-appearing, toxic-appearing or diaphoretic.  HENT:     Head: Normocephalic and atraumatic.  Eyes:     Conjunctiva/sclera: Conjunctivae normal.  Cardiovascular:     Rate and Rhythm: Normal rate and regular rhythm.     Heart sounds: No murmur heard. No friction rub. No gallop.   Pulmonary:     Effort: Pulmonary effort is normal. No respiratory distress.     Breath sounds: No stridor. No wheezing, rhonchi or rales.  Chest:     Chest wall: No tenderness.  Abdominal:     General: There is no distension.  Palpations: Abdomen is soft.  Musculoskeletal:     Cervical back: Neck supple.     Comments: Tender to palpation to the left elbow with minimal swelling.  Decreased range of motion secondary to pain.  She has decreased sensation to sharp and light touch in all digits of the left hand.  She is able to grip, but grip strength is decreased when compared to the right.  Radial pulses are 2+ bilaterally and she has good capillary refill.  Full active and passive range of motion of the left wrist and shoulder.  No focal tenderness to palpation.    Spine is nontender.   She has tenderness to palpation to the left posterior pelvis.  No left hip tenderness.  No tenderness to the left knee or ankle.  Skin:    General: Skin is warm.     Findings: No rash.     Comments: Bruising noted on the volar surface of the left wrist.  There is also bandlike ecchymosis noted to the skin overlying the proximal left forearm.  Bruises appear a couple of days old.  There is a superficial abrasion noted to the skin overlying the left lumbar region.  Neurological:     Mental Status: She is alert.  Psychiatric:        Behavior: Behavior normal.     ED Results / Procedures / Treatments   Labs (all labs ordered are listed, but only abnormal results are displayed) Labs Reviewed  POC URINE PREG, ED    EKG None  Radiology DG Elbow Complete  Left  Result Date: 10/01/2020 CLINICAL DATA:  Fall, elbow pain EXAM: LEFT ELBOW - COMPLETE 3+ VIEW COMPARISON:  None. FINDINGS: There is a fracture through the base of the olecranon process with mild displacement. No subluxation or dislocation. No additional fracture. Soft tissues are intact. Small joint effusion present. IMPRESSION: Mildly displaced fracture at the base of the left olecranon process. Electronically Signed   By: Rolm Baptise M.D.   On: 10/01/2020 23:22   DG Wrist Complete Left  Result Date: 10/01/2020 CLINICAL DATA:  Fall, pain EXAM: LEFT WRIST - COMPLETE 3+ VIEW COMPARISON:  None. FINDINGS: There is no evidence of fracture or dislocation. There is no evidence of arthropathy or other focal bone abnormality. Soft tissues are unremarkable. IMPRESSION: Negative. Electronically Signed   By: Rolm Baptise M.D.   On: 10/01/2020 23:23   CT Head Wo Contrast  Result Date: 10/02/2020 CLINICAL DATA:  Fall EXAM: CT HEAD WITHOUT CONTRAST TECHNIQUE: Contiguous axial images were obtained from the base of the skull through the vertex without intravenous contrast. COMPARISON:  07/25/2015 FINDINGS: Brain: Mild atrophy, advanced for age. No acute intracranial abnormality. Specifically, no hemorrhage, hydrocephalus, mass lesion, acute infarction, or significant intracranial injury. Vascular: No hyperdense vessel or unexpected calcification. Skull: No acute calvarial abnormality. Sinuses/Orbits: Visualized paranasal sinuses and mastoids clear. Orbital soft tissues unremarkable. Other: None IMPRESSION: Mild age advanced atrophy.  No acute intracranial abnormality. Electronically Signed   By: Rolm Baptise M.D.   On: 10/02/2020 00:46   CT Elbow Left Wo Contrast  Result Date: 10/02/2020 CLINICAL DATA:  Fall, elbow fracture on plain film EXAM: CT OF THE UPPER LEFT EXTREMITY WITHOUT CONTRAST TECHNIQUE: Multidetector CT imaging of the upper left extremity was performed according to the standard protocol.  COMPARISON:  Plain films earlier today FINDINGS: Mildly comminuted fracture noted through the olecranon process. Mild displacement at the base of the olecranon, 6 mm. No additional acute bony abnormality. No subluxation  or dislocation. Stranding noted in the posterior subcutaneous soft tissues, possibly bruising. No visible significant joint effusion. IMPRESSION: Comminuted displaced olecranon process fracture. Electronically Signed   By: Rolm Baptise M.D.   On: 10/02/2020 00:49   DG Hip Unilat W or Wo Pelvis 2-3 Views Left  Result Date: 10/02/2020 CLINICAL DATA:  Fall, left hip pain EXAM: DG HIP (WITH OR WITHOUT PELVIS) 2-3V LEFT COMPARISON:  None. FINDINGS: There is no evidence of hip fracture or dislocation. There is no evidence of arthropathy or other focal bone abnormality. IMPRESSION: Negative. Electronically Signed   By: Rolm Baptise M.D.   On: 10/02/2020 00:49    Procedures Procedures (including critical care time)  Medications Ordered in ED Medications  fentaNYL (SUBLIMAZE) injection 50 mcg (50 mcg Intramuscular Given 10/02/20 0011)    ED Course  I have reviewed the triage vital signs and the nursing notes.  Pertinent labs & imaging results that were available during my care of the patient were reviewed by me and considered in my medical decision making (see chart for details).  Clinical Course as of 10/02/20 0411  Wed Oct 02, 2020  0116 Reviewed elbow CT with Dr. Rolena Infante, orthopedic surgery.  He recommends placing the patient in a left posterior fiberglass splint and a sling.  He would like her to come to the Access clinic at the [facility] office at 11 AM to be seen.  She will not need an appointment. [MM]  0116 Spoke with the patient and updated her on the plan.  She is actively eating a sandwich at bedside.  Attempted to clarify on date of injury as patient stated it was yesterday, but also stated that it occurred after she was discharged from the hospital.  She now states that  the injury occurred on her day of discharge, when she left AMA. This was 12/10.  Splint has not yet been placed.  She is noted to be fully ranging the left elbow to pick up objects out of the floor and is in no acute distress. [MM]    Clinical Course User Index [MM] Denetra Formoso, Laymond Purser, PA-C   MDM Rules/Calculators/A&P                          39 year old female with a history of paranoid schizophrenia, cocaine use disorder, alcohol dependence who presents the emergency department with a chief complaint of fall.  She has tenderness to the left elbow and bruising noted to the left elbow and forearm.  She did note that she hit her head in the fall.  Initially, she indicated that the fall was yesterday, but later discloses that it was approximately 4 to 5 days ago when she left the hospital AMA.  Imaging has been independently reviewed and interpreted by me.  X-ray of the left elbow with a mildly displaced fracture at the base of the left olecranon process.  Imaging was otherwise unremarkable.  I discussed the patient with Dr. Rolena Infante, orthopedic surgery who recommended a CT of the left elbow that demonstrated a comminuted mildly displaced fracture.  He recommended placing the patient in a fiberglass posterior splint with a sling and having her follow-up in the office tomorrow.  Regarding pain control, she was given fullness of fentanyl in the ER.  After reviewing her chart, I am very concerned about sending her home with narcotic pain medication given her history of alcohol dependence.  Notably, when I read entered the room she was moving  the left elbow to pick up objects off the floor and was not complaining of pain.  I think it would be reasonable to treat her with OTC pain medication initially also initially indicated that she was having no pain as long as she held the left elbow still and now she is in a splint.  She will follow up with orthopedic surgery in the office tomorrow.  ER return  precautions given.  She is hemodynamically stable no acute distress.  Safe for discharge home with outpatient follow-up as indicated.   Final Clinical Impression(s) / ED Diagnoses Final diagnoses:  Fall, initial encounter  Closed olecranon process fracture, left, initial encounter    Rx / DC Orders ED Discharge Orders    None       Maveryk Renstrom A, PA-C 35/[id number]/97 [id number]    Delora Fuel, MD 41/28/20 [phone]

## 2020-10-02 ENCOUNTER — Emergency Department (HOSPITAL_COMMUNITY): Payer: Medicaid Other

## 2020-10-02 ENCOUNTER — Encounter (HOSPITAL_COMMUNITY): Payer: Self-pay

## 2020-10-02 NOTE — ED Notes (Signed)
Pt threatening to call 911. Security called to standby.

## 2020-10-02 NOTE — ED Notes (Signed)
Patient stating we are killing her that she is going to die of starvation because we will not give her anything to eat or drink. Staff has explained multiple times that we have to wait for her CT scan to result before we are able to provide her with anything.

## 2020-10-02 NOTE — Progress Notes (Signed)
Orthopedic Tech Progress Note Patient Details:  Jessica Case 05-19-1981 [id number]  Ortho Devices Type of Ortho Device: Post (long arm) splint,Sling immobilizer Splint Material: Fiberglass Ortho Device/Splint Location: Left Upper Extremity Ortho Device/Splint Interventions: Ordered,Adjustment,Application   Post Interventions Patient Tolerated: Well Instructions Provided: Adjustment of device,Care of device,Poper ambulation with device   [name] 10/02/2020, 6:48 AM

## 2020-10-02 NOTE — ED Notes (Addendum)
Pt given sandwich and drink at this time.

## 2020-10-02 NOTE — Discharge Instructions (Addendum)
Thank you for allowing me to care for you today in the Emergency Department.   Go to [facility] tomorrow at 11 AM. They will see you in the Access clinic. The office address is listed above.   Take 650 mg of Tylenol or 600 mg of ibuprofen with food every 6 hours for pain.  You can alternate between these 2 medications every 3 hours if your pain returns.  For instance, you can take Tylenol at noon, followed by a dose of ibuprofen at 3, followed by second dose of Tylenol and 6. Do not take this medication if you are drinking alcohol.  Keep the splint in place until you are seen by orthopedics.  You can cover with a plastic bag if you need to bathe or shower.  Return to the emergency department if you have any fall or injury, if your fingers turn blue, if you have significantly worsening pain or swelling in the arm, or if you develop other new, concerning symptoms.

## 2020-10-06 ENCOUNTER — Emergency Department (HOSPITAL_COMMUNITY)
Admission: EM | Admit: 2020-10-06 | Discharge: 2020-10-06 | Disposition: A | Discharge status: Home / Self Care | Payer: Medicaid Other | Attending: Emergency Medicine | Admitting: Emergency Medicine

## 2020-10-06 ENCOUNTER — Encounter (HOSPITAL_COMMUNITY): Payer: Self-pay | Admitting: *Deleted

## 2020-10-06 ENCOUNTER — Other Ambulatory Visit: Payer: Self-pay

## 2020-10-06 DIAGNOSIS — Z5321 Procedure and treatment not carried out due to patient leaving prior to being seen by health care provider: Secondary | ICD-10-CM | POA: Diagnosis not present

## 2020-10-06 DIAGNOSIS — S01511A Laceration without foreign body of lip, initial encounter: Secondary | ICD-10-CM | POA: Diagnosis not present

## 2020-10-06 DIAGNOSIS — F1099 Alcohol use, unspecified with unspecified alcohol-induced disorder: Secondary | ICD-10-CM | POA: Insufficient documentation
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
## 2020-10-06 NOTE — ED Notes (Signed)
After speaking with [employer] officers, pt got up and walked out of triage area and was trying to find her ride home. Has been notified of the risk of leaving and states that she has a ride, will not be driving at this time.

## 2020-10-06 NOTE — ED Notes (Signed)
Pt mother would like to get an update when one is available [phone] [facility]

## 2020-10-21 ENCOUNTER — Encounter (HOSPITAL_COMMUNITY): Payer: Self-pay | Admitting: Orthopedic Surgery

## 2020-10-22 ENCOUNTER — Other Ambulatory Visit: Payer: Self-pay

## 2020-10-22 ENCOUNTER — Encounter (HOSPITAL_COMMUNITY): Payer: Self-pay | Admitting: Orthopedic Surgery

## 2020-10-22 NOTE — Progress Notes (Signed)
Jessica Case denies chest pain or shortness of breath. Jessica Case denies any s/s of Covid and denies being in contact with anyone with Covid. Patient will be tested on arrival for Covid. Jessica Case reported that no one had told her when surgery. I asked patient if she could get a ride to the testing center now, she said no.  I asked if patient has anyone to drive her home and stay with her for the first 24 hours, "I didn't know I was having surgery tomorrow, so how could I know I needed someone."  I asked patient if she could work on arranging a driver and someone to stay with her- she said she would.   Jessica Case is not taking any medications, "I was taking Ibuprofen , but I felt like I was taking too much.  I asked patient how many Ibuprofen she took at a time, patient said 6, I instructed patient to  ot take any more.Patient also said that she had been on a blood pressure pill, but is no longer taking, and a medication from Behavior health, but no longer taking that.

## 2020-10-22 NOTE — H&P (Signed)
Jessica Case is an 40 y.o. female.   Chief Complaint: LEFT ELBOW PAIN  HPI: The patient is a 40y/o right hand dominant female who fell as she was leaving the hospital after being treated for pneumonia on 10/01/20. She landed on the left arm and had immediate pain, swelling, and stiffness. She was seen initially for the elbow and treated with a long arm splint. She removed the splint stating that it was uncomfortable.  Discussed the reason and rationale for surgical intervention.  She is here today for surgery.  She denies chest pain, shortness of breath, fever, chills, nausea, vomiting, or diarrhea.    Past Medical History:  Diagnosis Date  . Arthritis   . Claustrophobia   . Cold    currently - no meds  . Fallopian tube abscess   . Fibroids   . Paranoid schizophrenia ([place])   . SVD (spontaneous vaginal delivery)    x 1    Past Surgical History:  Procedure Laterality Date  . BUNIONECTOMY    . bunyonectomy Bilateral    x 1  . LAPAROSCOPIC UNILATERAL SALPINGECTOMY Left 08/18/2016   Procedure: LAPAROSCOPIC UNILATERAL SALPINGECTOMY;  Surgeon: Lavonia Drafts, MD;  Location: [facility];  Service: Gynecology;  Laterality: Left;  . LAPAROSCOPY  08/18/2016   Procedure: LAPAROSCOPY OPERATIVE;  Surgeon: Lavonia Drafts, MD;  Location: [facility];  Service: Gynecology;;  . LAPAROTOMY  08/18/2016   Procedure: LAPAROTOMY;  Surgeon: Lavonia Drafts, MD;  Location: [facility];  Service: Gynecology;;  mini laparotomy  . MYOMECTOMY  08/18/2016   Procedure: MYOMECTOMY;  Surgeon: Lavonia Drafts, MD;  Location: [facility];  Service: Gynecology;;  . TOOTH EXTRACTION      Family History  Problem Relation Age of Onset  . Hypertension Mother    Social History:  reports that she has been smoking cigarettes. She has a 2.30 pack-year smoking history. She has never used smokeless tobacco. She reports current alcohol use. She reports previous drug use. Drugs: Marijuana and "Crack"  cocaine.  Allergies:  Allergies  Allergen Reactions  . Citrus Hives and Itching  . Food Hives, Itching and Other (See Comments)    Pt confirmed that it is not an anaphylactic reaction  . Strawberry Flavor     Hives all over body.   . Toradol [Ketorolac Tromethamine] Hives and Itching    No medications prior to admission.    No results found for this or any previous visit (from the past 48 hour(s)). No results found.  ROS NO RECENT ILLNESSES OR HOSPITALIZATIONS  There were no vitals taken for this visit. Physical Exam  General Appearance:  Alert, cooperative, no distress, appears stated age  Head:  Normocephalic, without obvious abnormality, atraumatic  Eyes:  Pupils equal, conjunctiva/corneas clear,         Throat: Lips, mucosa, and tongue normal; teeth and gums normal  Neck: No visible masses     Lungs:   respirations unlabored  Chest Wall:  No tenderness or deformity  Heart:  Regular rate and rhythm,  Abdomen:   Soft, non-tender,         Extremities: LUE - NO OPEN WOUNDS, ERYTHEMA, OR DRAINAGE. MODERATE SWELLING AT THE ELBOW. SENSATION INTACT TO LIGHT TOUCH DISTALLY. CAPILLARY REFILL LESS THAN 2 SECONDS. GENERALIZED TENDERNESS TO PALPATION OF THE ELBOW. LIMITED MOTION OF THE ELBOW IN ALL DIRECTIONS DUE TO PAIN. ABLE TO MAKE A FULL FIST, CROSS FINGERS, AND ABDUCT THUMB.   Pulses: 2+ and symmetric  Skin: Skin color, texture, turgor normal, no rashes  or lesions     Neurologic: Normal    Assessment/Plan LEFT ELBOW PROXIMAL ULNA DISPLACED FRACTURE    - LEFT ELBOW OPEN REDUCTION AND INTERNAL FIXATION WITH REPAIR AS INDICATED  R/B/A DISCUSSED WITH PT IN OFFICE.  PT VOICED UNDERSTANDING OF PLAN CONSENT SIGNED DAY OF SURGERY PT SEEN AND EXAMINED PRIOR TO OPERATIVE PROCEDURE/DAY OF SURGERY SITE MARKED. QUESTIONS ANSWERED WILL GO HOME FOLLOWING SURGERY   WE ARE PLANNING SURGERY FOR YOUR UPPER EXTREMITY. THE RISKS AND BENEFITS OF SURGERY INCLUDE BUT NOT LIMITED TO  BLEEDING INFECTION, DAMAGE TO NEARBY NERVES ARTERIES TENDONS, FAILURE OF SURGERY TO ACCOMPLISH ITS INTENDED GOALS, PERSISTENT SYMPTOMS AND NEED FOR FURTHER SURGICAL INTERVENTION. WITH THIS IN MIND WE WILL PROCEED. I HAVE DISCUSSED WITH THE PATIENT THE PRE AND POSTOPERATIVE REGIMEN AND THE DOS AND DON'TS. PT VOICED UNDERSTANDING AND INFORMED CONSENT SIGNED.  Dymphna Wadley [facility] 10/23/20  Karma Greaser 10/22/2020, 11:51 AM

## 2020-10-23 ENCOUNTER — Encounter (HOSPITAL_COMMUNITY)
Admission: RE | Disposition: A | Discharge status: Home / Self Care | Payer: Self-pay | Source: Home / Self Care | Attending: Orthopedic Surgery

## 2020-10-23 ENCOUNTER — Ambulatory Visit (HOSPITAL_COMMUNITY)
Admission: RE | Admit: 2020-10-23 | Discharge: 2020-10-23 | Disposition: A | Discharge status: Home / Self Care | Payer: Medicaid Other | Attending: Orthopedic Surgery | Admitting: Orthopedic Surgery

## 2020-10-23 ENCOUNTER — Encounter (HOSPITAL_COMMUNITY): Payer: Self-pay | Admitting: Orthopedic Surgery

## 2020-10-23 ENCOUNTER — Ambulatory Visit (HOSPITAL_COMMUNITY): Payer: Medicaid Other | Admitting: Certified Registered Nurse Anesthetist

## 2020-10-23 ENCOUNTER — Ambulatory Visit (HOSPITAL_COMMUNITY): Payer: Medicaid Other

## 2020-10-23 DIAGNOSIS — S52022A Displaced fracture of olecranon process without intraarticular extension of left ulna, initial encounter for closed fracture: Secondary | ICD-10-CM | POA: Diagnosis not present

## 2020-10-23 DIAGNOSIS — F17210 Nicotine dependence, cigarettes, uncomplicated: Secondary | ICD-10-CM | POA: Diagnosis not present

## 2020-10-23 DIAGNOSIS — S52002A Unspecified fracture of upper end of left ulna, initial encounter for closed fracture: Secondary | ICD-10-CM

## 2020-10-23 DIAGNOSIS — Z20822 Contact with and (suspected) exposure to covid-19: Secondary | ICD-10-CM | POA: Insufficient documentation

## 2020-10-23 DIAGNOSIS — W19XXXA Unspecified fall, initial encounter: Secondary | ICD-10-CM | POA: Diagnosis not present

## 2020-10-23 HISTORY — PX: ORIF ELBOW FRACTURE: SHX5031

## 2020-10-23 HISTORY — DX: Essential (primary) hypertension: I10

## 2020-10-23 LAB — COMPREHENSIVE METABOLIC PANEL
ALT: 21 U/L (ref 0–44)
AST: 48 U/L — ABNORMAL HIGH (ref 15–41)
Albumin: 4 g/dL (ref 3.5–5.0)
Alkaline Phosphatase: 102 U/L (ref 38–126)
Anion gap: 12 (ref 5–15)
BUN: <5 mg/dL — ABNORMAL LOW (ref 6–20)
CO2: 23 mmol/L (ref 22–32)
Calcium: 9.6 mg/dL (ref 8.9–10.3)
Chloride: 102 mmol/L (ref 98–111)
Creatinine, Ser: 0.54 mg/dL (ref 0.44–1.00)
GFR, Estimated: >60 mL/min (ref >60)
Glucose, Bld: 83 mg/dL (ref 70–99)
Potassium: 3.8 mmol/L (ref 3.5–5.1)
Sodium: 137 mmol/L (ref 135–145)
Total Bilirubin: 1.5 mg/dL — ABNORMAL HIGH (ref 0.3–1.2)
Total Protein: 7.5 g/dL (ref 6.5–8.1)

## 2020-10-23 LAB — POCT PREGNANCY, URINE: Preg Test, Ur: NEGATIVE

## 2020-10-23 LAB — SURGICAL PCR SCREEN
MRSA, PCR: NEGATIVE
Staphylococcus aureus: NEGATIVE

## 2020-10-23 LAB — SARS CORONAVIRUS 2 BY RT PCR (HOSPITAL ORDER, PERFORMED IN ~~LOC~~ HOSPITAL LAB): SARS Coronavirus 2: NEGATIVE

## 2020-10-23 SURGERY — OPEN REDUCTION INTERNAL FIXATION (ORIF) ELBOW/OLECRANON FRACTURE
Anesthesia: General | Site: Elbow | Laterality: Left

## 2020-10-23 MED ORDER — MIDAZOLAM HCL 2 MG/2ML IJ SOLN
INTRAMUSCULAR | Status: AC
  Filled 2020-10-23: qty 2

## 2020-10-23 MED ORDER — OXYCODONE HCL 5 MG PO TABS: 5.0000 mg | ORAL_TABLET | Freq: Once | ORAL | Status: DC | PRN

## 2020-10-23 MED ORDER — LACTATED RINGERS IV SOLN: INTRAVENOUS | Status: DC

## 2020-10-23 MED ORDER — LABETALOL HCL 5 MG/ML IV SOLN
10.0000 mg | INTRAVENOUS | Status: AC | PRN
Start: 2020-10-23 — End: 2020-10-23
  Administered 2020-10-23 (×2): 10 mg via INTRAVENOUS

## 2020-10-23 MED ORDER — LIDOCAINE 2% (20 MG/ML) 5 ML SYRINGE
INTRAMUSCULAR | Status: AC
  Filled 2020-10-23: qty 5

## 2020-10-23 MED ORDER — 0.9 % SODIUM CHLORIDE (POUR BTL) OPTIME
TOPICAL | Status: DC | PRN
  Administered 2020-10-23: 1000 mL

## 2020-10-23 MED ORDER — ACETAMINOPHEN 10 MG/ML IV SOLN
INTRAVENOUS | Status: DC | PRN
  Administered 2020-10-23: 1000 mg via INTRAVENOUS

## 2020-10-23 MED ORDER — DEXAMETHASONE SODIUM PHOSPHATE 10 MG/ML IJ SOLN
INTRAMUSCULAR | Status: AC
  Filled 2020-10-23: qty 1

## 2020-10-23 MED ORDER — HYDROMORPHONE HCL 1 MG/ML IJ SOLN
0.2500 mg | INTRAMUSCULAR | Status: DC | PRN
Start: 2020-10-23 — End: 2020-10-24
  Administered 2020-10-23 (×4): 0.5 mg via INTRAVENOUS

## 2020-10-23 MED ORDER — DEXMEDETOMIDINE (PRECEDEX) IN NS 20 MCG/5ML (4 MCG/ML) IV SYRINGE
PREFILLED_SYRINGE | INTRAVENOUS | Status: DC | PRN
  Administered 2020-10-23 (×5): 4 ug via INTRAVENOUS

## 2020-10-23 MED ORDER — BUPIVACAINE HCL (PF) 0.25 % IJ SOLN
INTRAMUSCULAR | Status: AC
  Filled 2020-10-23: qty 30

## 2020-10-23 MED ORDER — PROPOFOL 10 MG/ML IV BOLUS
INTRAVENOUS | Status: DC | PRN
  Administered 2020-10-23: 200 mg via INTRAVENOUS

## 2020-10-23 MED ORDER — MIDAZOLAM HCL 2 MG/2ML IJ SOLN
INTRAMUSCULAR | Status: DC | PRN
  Administered 2020-10-23: 2 mg via INTRAVENOUS

## 2020-10-23 MED ORDER — MIDAZOLAM HCL 2 MG/2ML IJ SOLN
INTRAMUSCULAR | Status: AC
  Administered 2020-10-23: 1 mg via INTRAVENOUS
  Filled 2020-10-23: qty 2

## 2020-10-23 MED ORDER — FENTANYL CITRATE (PF) 250 MCG/5ML IJ SOLN
INTRAMUSCULAR | Status: AC
  Filled 2020-10-23: qty 5

## 2020-10-23 MED ORDER — PROMETHAZINE HCL 25 MG/ML IJ SOLN: 6.2500 mg | INTRAMUSCULAR | Status: DC | PRN

## 2020-10-23 MED ORDER — FENTANYL CITRATE (PF) 100 MCG/2ML IJ SOLN
INTRAMUSCULAR | Status: AC
  Administered 2020-10-23: 50 ug via INTRAVENOUS
  Filled 2020-10-23: qty 2

## 2020-10-23 MED ORDER — KETAMINE HCL 10 MG/ML IJ SOLN
INTRAMUSCULAR | Status: DC | PRN
  Administered 2020-10-23: 30 mg via INTRAVENOUS

## 2020-10-23 MED ORDER — HYDROMORPHONE HCL 1 MG/ML IJ SOLN
0.5000 mg | INTRAMUSCULAR | Status: DC | PRN
  Administered 2020-10-23: 0.5 mg via INTRAVENOUS

## 2020-10-23 MED ORDER — SUGAMMADEX SODIUM 200 MG/2ML IV SOLN
INTRAVENOUS | Status: DC | PRN
  Administered 2020-10-23: 100 mg via INTRAVENOUS

## 2020-10-23 MED ORDER — CEFAZOLIN SODIUM-DEXTROSE 2-4 GM/100ML-% IV SOLN
2.0000 g | INTRAVENOUS | Status: AC
  Administered 2020-10-23: 2 g via INTRAVENOUS
  Filled 2020-10-23: qty 100

## 2020-10-23 MED ORDER — LIDOCAINE 2% (20 MG/ML) 5 ML SYRINGE
INTRAMUSCULAR | Status: DC | PRN
  Administered 2020-10-23: 40 mg via INTRAVENOUS

## 2020-10-23 MED ORDER — ROCURONIUM BROMIDE 10 MG/ML (PF) SYRINGE
PREFILLED_SYRINGE | INTRAVENOUS | Status: AC
  Filled 2020-10-23: qty 10

## 2020-10-23 MED ORDER — MIDAZOLAM HCL 2 MG/2ML IJ SOLN: 0.5000 mg | Freq: Once | INTRAMUSCULAR | Status: DC | PRN

## 2020-10-23 MED ORDER — LABETALOL HCL 5 MG/ML IV SOLN
INTRAVENOUS | Status: AC
  Filled 2020-10-23: qty 4

## 2020-10-23 MED ORDER — ROCURONIUM BROMIDE 10 MG/ML (PF) SYRINGE
PREFILLED_SYRINGE | INTRAVENOUS | Status: DC | PRN
  Administered 2020-10-23: 20 mg via INTRAVENOUS
  Administered 2020-10-23: 80 mg via INTRAVENOUS

## 2020-10-23 MED ORDER — PROPOFOL 10 MG/ML IV BOLUS
INTRAVENOUS | Status: AC
  Filled 2020-10-23: qty 40

## 2020-10-23 MED ORDER — OXYCODONE HCL 5 MG/5ML PO SOLN
5.0000 mg | Freq: Once | ORAL | Status: DC | PRN
Start: 2020-10-23 — End: 2020-10-24

## 2020-10-23 MED ORDER — HYDROMORPHONE HCL 1 MG/ML IJ SOLN
INTRAMUSCULAR | Status: AC
  Filled 2020-10-23: qty 1

## 2020-10-23 MED ORDER — FENTANYL CITRATE (PF) 250 MCG/5ML IJ SOLN
INTRAMUSCULAR | Status: DC | PRN
  Administered 2020-10-23: 50 ug via INTRAVENOUS
  Administered 2020-10-23: 100 ug via INTRAVENOUS
  Administered 2020-10-23: 50 ug via INTRAVENOUS

## 2020-10-23 MED ORDER — MEPERIDINE HCL 25 MG/ML IJ SOLN: 6.2500 mg | INTRAMUSCULAR | Status: DC | PRN

## 2020-10-23 MED ORDER — MIDAZOLAM HCL 2 MG/2ML IJ SOLN: 1.0000 mg | Freq: Once | INTRAMUSCULAR | Status: AC

## 2020-10-23 MED ORDER — KETAMINE HCL 50 MG/5ML IJ SOSY
PREFILLED_SYRINGE | INTRAMUSCULAR | Status: AC
  Filled 2020-10-23: qty 5

## 2020-10-23 MED ORDER — ACETAMINOPHEN 10 MG/ML IV SOLN
INTRAVENOUS | Status: AC
  Filled 2020-10-23: qty 100

## 2020-10-23 MED ORDER — DEXAMETHASONE SODIUM PHOSPHATE 10 MG/ML IJ SOLN
INTRAMUSCULAR | Status: DC | PRN
  Administered 2020-10-23: 5 mg via INTRAVENOUS

## 2020-10-23 MED ORDER — CHLORHEXIDINE GLUCONATE 0.12 % MT SOLN
15.0000 mL | Freq: Once | OROMUCOSAL | Status: AC
  Administered 2020-10-23: 15 mL via OROMUCOSAL
  Filled 2020-10-23: qty 15

## 2020-10-23 MED ORDER — ONDANSETRON HCL 4 MG/2ML IJ SOLN
INTRAMUSCULAR | Status: AC
  Filled 2020-10-23: qty 2

## 2020-10-23 MED ORDER — ONDANSETRON HCL 4 MG/2ML IJ SOLN
INTRAMUSCULAR | Status: DC | PRN
  Administered 2020-10-23: 4 mg via INTRAVENOUS

## 2020-10-23 MED ORDER — BUPIVACAINE HCL (PF) 0.25 % IJ SOLN
INTRAMUSCULAR | Status: DC | PRN
  Administered 2020-10-23: 10 mL

## 2020-10-23 MED ORDER — FENTANYL CITRATE (PF) 100 MCG/2ML IJ SOLN: 50.0000 ug | Freq: Once | INTRAMUSCULAR | Status: AC

## 2020-10-23 SURGICAL SUPPLY — 71 items
BIT DRILL 2.5X2.75 QC CALB (BIT) ×1 IMPLANT
BIT DRILL CALIBRATED 2.7 (BIT) ×1 IMPLANT
BNDG CMPR 9X4 STRL LF SNTH (GAUZE/BANDAGES/DRESSINGS) ×1
BNDG COHESIVE 4X5 TAN STRL (GAUZE/BANDAGES/DRESSINGS) ×2 IMPLANT
BNDG ELASTIC 3X5.8 VLCR STR LF (GAUZE/BANDAGES/DRESSINGS) ×1 IMPLANT
BNDG ELASTIC 4X5.8 VLCR STR LF (GAUZE/BANDAGES/DRESSINGS) ×1 IMPLANT
BNDG ESMARK 4X9 LF (GAUZE/BANDAGES/DRESSINGS) ×2 IMPLANT
BNDG GAUZE ELAST 4 BULKY (GAUZE/BANDAGES/DRESSINGS) ×1 IMPLANT
CORD BIPOLAR FORCEPS 12FT (ELECTRODE) ×2 IMPLANT
COVER MAYO STAND STRL (DRAPES) ×2 IMPLANT
COVER SURGICAL LIGHT HANDLE (MISCELLANEOUS) ×2 IMPLANT
COVER WAND RF STERILE (DRAPES) ×2 IMPLANT
CUFF TOURN SGL QUICK 18X4 (TOURNIQUET CUFF) ×2 IMPLANT
CUFF TOURN SGL QUICK 24 (TOURNIQUET CUFF)
CUFF TRNQT CYL 24X4X16.5-23 (TOURNIQUET CUFF) IMPLANT
DRAPE INCISE IOBAN 66X45 STRL (DRAPES) ×2 IMPLANT
DRAPE OEC MINIVIEW 54X84 (DRAPES) IMPLANT
DRAPE ORTHO SPLIT 77X108 STRL (DRAPES) ×4
DRAPE SURG ORHT 6 SPLT 77X108 (DRAPES) ×2 IMPLANT
DRAPE U-SHAPE 47X51 STRL (DRAPES) ×2 IMPLANT
DRSG ADAPTIC 3X8 NADH LF (GAUZE/BANDAGES/DRESSINGS) ×1 IMPLANT
GAUZE SPONGE 4X4 12PLY STRL (GAUZE/BANDAGES/DRESSINGS) ×1 IMPLANT
GAUZE XEROFORM 5X9 LF (GAUZE/BANDAGES/DRESSINGS) ×2 IMPLANT
GLOVE BIOGEL PI IND STRL 8.5 (GLOVE) ×1 IMPLANT
GLOVE BIOGEL PI INDICATOR 8.5 (GLOVE) ×1
GLOVE SURG ORTHO 8.0 STRL STRW (GLOVE) ×2 IMPLANT
GOWN STRL REUS W/ TWL LRG LVL3 (GOWN DISPOSABLE) ×2 IMPLANT
GOWN STRL REUS W/ TWL XL LVL3 (GOWN DISPOSABLE) ×1 IMPLANT
GOWN STRL REUS W/TWL LRG LVL3 (GOWN DISPOSABLE) ×4
GOWN STRL REUS W/TWL XL LVL3 (GOWN DISPOSABLE) ×2
K-WIRE FIXATION 2.0X6 (WIRE) ×4
KIT BASIN OR (CUSTOM PROCEDURE TRAY) ×2 IMPLANT
KIT TURNOVER KIT B (KITS) ×2 IMPLANT
KWIRE FIXATION 2.0X6 (WIRE) IMPLANT
LOOP VESSEL MAXI BLUE (MISCELLANEOUS) IMPLANT
MANIFOLD NEPTUNE II (INSTRUMENTS) ×2 IMPLANT
NDL HYPO 25GX1X1/2 BEV (NEEDLE) IMPLANT
NEEDLE HYPO 25GX1X1/2 BEV (NEEDLE) IMPLANT
NS IRRIG 1000ML POUR BTL (IV SOLUTION) ×2 IMPLANT
PACK ORTHO EXTREMITY (CUSTOM PROCEDURE TRAY) ×2 IMPLANT
PAD ARMBOARD 7.5X6 YLW CONV (MISCELLANEOUS) ×4 IMPLANT
PAD CAST 3X4 CTTN HI CHSV (CAST SUPPLIES) IMPLANT
PAD CAST 4YDX4 CTTN HI CHSV (CAST SUPPLIES) IMPLANT
PADDING CAST COTTON 3X4 STRL (CAST SUPPLIES) ×2
PADDING CAST COTTON 4X4 STRL (CAST SUPPLIES) ×2
PLATE OLECRANON SM (Plate) ×1 IMPLANT
SCREW CORT T15 24X3.5XST LCK (Screw) IMPLANT
SCREW CORTICAL 3.5X24MM (Screw) ×4 IMPLANT
SCREW CORTICAL LOW PROF 3.5X20 (Screw) ×1 IMPLANT
SCREW LOCK CORT STAR 3.5X12 (Screw) ×1 IMPLANT
SCREW LOCK CORT STAR 3.5X18 (Screw) ×2 IMPLANT
SCREW LOCK CORT STAR 3.5X22 (Screw) ×1 IMPLANT
SCREW LP 3.5X65MM (Screw) ×1 IMPLANT
SOAP 2 % CHG 4 OZ (WOUND CARE) ×2 IMPLANT
SUCTION FRAZIER HANDLE 10FR (MISCELLANEOUS)
SUCTION TUBE FRAZIER 10FR DISP (MISCELLANEOUS) IMPLANT
SUT MNCRL AB 3-0 PS2 27 (SUTURE) ×1 IMPLANT
SUT PROLENE 3 0 PS 2 (SUTURE) IMPLANT
SUT PROLENE 4 0 PS 2 18 (SUTURE) IMPLANT
SUT VIC AB 2-0 CT1 27 (SUTURE)
SUT VIC AB 2-0 CT1 TAPERPNT 27 (SUTURE) IMPLANT
SUT VIC AB 2-0 SH 27 (SUTURE) ×2
SUT VIC AB 2-0 SH 27X BRD (SUTURE) IMPLANT
SUT VICRYL 4-0 PS2 18IN ABS (SUTURE) IMPLANT
SYR CONTROL 10ML LL (SYRINGE) IMPLANT
TOWEL GREEN STERILE (TOWEL DISPOSABLE) ×4 IMPLANT
TOWEL GREEN STERILE FF (TOWEL DISPOSABLE) ×2 IMPLANT
TUBE CONNECTING 12X1/4 (SUCTIONS) IMPLANT
UNDERPAD 30X36 HEAVY ABSORB (UNDERPADS AND DIAPERS) ×2 IMPLANT
WASHER 3.5MM (Orthopedic Implant) ×1 IMPLANT
WATER STERILE IRR 1000ML POUR (IV SOLUTION) ×2 IMPLANT

## 2020-10-23 NOTE — Anesthesia Procedure Notes (Signed)
Procedure Name: Intubation Date/Time: 10/23/2020 7:11 AM Performed by: Reece Agar, CRNA Pre-anesthesia Checklist: Patient identified, Emergency Drugs available, Suction available and Patient being monitored Patient Re-evaluated:Patient Re-evaluated prior to induction Oxygen Delivery Method: Circle System Utilized Preoxygenation: Pre-oxygenation with 100% oxygen Induction Type: IV induction Ventilation: Mask ventilation without difficulty Laryngoscope Size: Mac and 3 Grade View: Grade I Tube type: Oral Tube size: 7.0 mm Number of attempts: 1 Airway Equipment and Method: Stylet Placement Confirmation: ETT inserted through vocal cords under direct vision,  positive ETCO2 and breath sounds checked- equal and bilateral Secured at: 21 cm Tube secured with: Tape Dental Injury: Teeth and Oropharynx as per pre-operative assessment

## 2020-10-23 NOTE — Transfer of Care (Signed)
Immediate Anesthesia Transfer of Care Note  Patient: Jessica Case  Procedure(s) Performed: OPEN REDUCTION INTERNAL FIXATION (ORIF) ELBOW/OLECRANON FRACTURE (Left Elbow)  Patient Location: PACU  Anesthesia Type:General  Level of Consciousness: awake, alert , oriented and patient cooperative  Airway & Oxygen Therapy: Patient Spontanous Breathing  Post-op Assessment: Report given to RN and Post -op Vital signs reviewed and stable  Post vital signs: Reviewed and stable  Last Vitals:  Vitals Value Taken Time  BP 152/123 10/23/20 1852  Temp    Pulse 86 10/23/20 1853  Resp 13 10/23/20 1853  SpO2 98 % 10/23/20 1853  Vitals shown include unvalidated device data.  Last Pain:  Vitals:   10/23/20 1431  TempSrc: Oral         Complications: No complications documented.   MDA CG notified of blood pressure.

## 2020-10-23 NOTE — Op Note (Signed)
PREOPERATIVE DIAGNOSIS: Left proximal olecranon fracture  POSTOPERATIVE DIAGNOSIS: Same  ATTENDING SURGEON: Dr. Iran Planas who scrubbed and present for the entire procedure  ASSISTANT SURGEON: Gertie Fey, PA-C was scrubbed and necessary for the reduction internal fixation closure and splinting in a timely fashion  ANESTHESIA: General via endotracheal tube  OPERATIVE PROCEDURE: Open treatment of left proximal olecranon fracture requiring internal fixation Radiographs 3 views left elbow  IMPLANTS: Biomet proximal olecranon plate with a combination locking nonlocking screws  EBL: Minimal  RADIOGRAPHIC INTERPRETATION: AP lateral oblique views of the elbow do show the posterior plate on the olecranon surface with good alignment of the ulnohumeral and radiocapitellar joints  SURGICAL INDICATIONS: Patient is a right-hand-dominant female who was referred to the office with a displaced proximal left arm fracture.  Patient was seen and evaluated and recommended to undergo the above procedure.  The risks of surgery include but not limited to bleeding infection damage nearby nerves arteries or tendons nonunion malunion hardware failure loss of motion of the elbow wrist and digits and need for further surgical invention.  Signed informed consent was obtained day of surgery.  SURGICAL TECHNIQUE: Patient was palpated find the preoperative holding area marked apart a marker made the left elbow and indicate correct operative site.  Patient brought back operating placed supine on anesthesia table where the general endotracheal anesthesia was administered.  Patient tolerates well.  A well-padded tourniquet was then placed on the left brachium seal with the appropriate drape.  The left upper extremity was then prepped and draped in normal sterile fashion.  A timeout was called the correct site was identified procedure then begun.  Attention was then turned to the elbow.  Curvilinear incision made directly  over the posterior surface of the olecranon.  Curving radially at the olecranon tip.  Dissection carried down to the skin and subcutaneous tissue.  Fascia layer was incised longitudinally and the fracture site was then exposed.  Thick fascial flaps were then elevated to expose the fracture site.  The fracture was over 22 weeks old.  Takedown of the early fracture callus and hematoma was then carried out.  Reduction clamp was then used and the reduction was then performed.  Following this this was held in place with a K wire.  After temporary fixation the plate was then applied.  Was confirmed using the mini C arm.  After this the K wires were then used through the plate to hold the plate in place.  The proximal screws were placed with a locking screws.  Following this the oblong screw hole was then placed within the shaft distally with a 2.5 mm drill bit appropriate drilling of both cortices and the plate loaded and compression.  Following this 2 more 3 5 screws were then placed distally.  Once this was carried out 1 locking screw was then placed in the shaft distal to the fracture site.  Finally the oblique screw was then placed proximally across the fracture engaging the anterior cortex and nicely.  This was appropriately drilled with a 2.5 mm drill bit in the 65 mm screw.  The wound was irrigated.  After copious wound irrigation final radiographs were then obtained.  The fascial layer was closed nicely with 2-0 Vicryl the subcutaneous tissues closed Monocryl and the skin closed with skin staples.  Adaptic dressing sterile compressive bandage then applied.  10 cc quarter percent Marcaine infiltrated locally.  Patient was then placed in a well-padded posterior splint I explained taken recovery in good  condition.  POSTOPERATIVE PLAN: Patient be discharged to home.  See him back in the office in 2 weeks for wound check staple removal x-rays down to see our therapist for a long-arm posterior splint begin an  outpatient therapy regimen begin working on gentle range of motion and activity.  Radiographs at each visit.

## 2020-10-23 NOTE — Discharge Instructions (Addendum)
KEEP BANDAGE CLEAN AND DRY CALL OFFICE FOR F/U APPT [phone] in 2 weeks with Willaim Rayas Rx sent to [facility], Percocet 10/325 and Robaxin and Colace KEEP HAND ELEVATED ABOVE HEART OK TO APPLY ICE TO OPERATIVE AREA CONTACT OFFICE IF ANY WORSENING PAIN OR CONCERNS.

## 2020-10-23 NOTE — Anesthesia Preprocedure Evaluation (Addendum)
Anesthesia Evaluation  Patient identified by MRN, date of birth, ID band Patient awake    Reviewed: Allergy & Precautions, NPO status , Patient's Chart, lab work & pertinent test results  History of Anesthesia Complications Negative for: history of anesthetic complications  Airway Mallampati: I  TM Distance: >3 FB Neck ROM: Full    Dental  (+) Dental Advisory Given   Pulmonary COPD, Current Smoker and Patient abstained from smoking.,  10/23/2020 SARS coronavirus NEG   breath sounds clear to auscultation       Cardiovascular hypertension (no meds), (-) angina Rhythm:Regular Rate:Normal     Neuro/Psych  Headaches, Seizures - (recent ETOH withdrawal seizure),  Anxiety Schizophrenia    GI/Hepatic negative GI ROS, (+)     substance abuse  alcohol use and cocaine use,   Endo/Other  negative endocrine ROS  Renal/GU negative Renal ROS     Musculoskeletal   Abdominal   Peds  Hematology negative hematology ROS (+)   Anesthesia Other Findings   Reproductive/Obstetrics                            Anesthesia Physical Anesthesia Plan  ASA: III  Anesthesia Plan: General   Post-op Pain Management:    Induction: Intravenous  PONV Risk Score and Plan: 2 and Ondansetron and Dexamethasone  Airway Management Planned: Oral ETT  Additional Equipment: None  Intra-op Plan:   Post-operative Plan: Extubation in OR  Informed Consent: I have reviewed the patients History and Physical, chart, labs and discussed the procedure including the risks, benefits and alternatives for the proposed anesthesia with the patient or authorized representative who has indicated his/her understanding and acceptance.     Dental advisory given  Plan Discussed with: CRNA and Surgeon  Anesthesia Plan Comments: (Pt refuses regional anesthesia)        Anesthesia Quick Evaluation

## 2020-10-23 NOTE — Progress Notes (Signed)
Upon arrival to PACU patient's BP 170-180/115-130, HR SR in 90's, SO2 97% on room air. Patient with hx of hypertension, on no home medications.  Dr. Chilton Si notified and 20mg  (2x10mg ) ordered.  Will continue monitor.

## 2020-10-23 NOTE — Anesthesia Postprocedure Evaluation (Signed)
Anesthesia Post Note  Patient: Jessica Case  Procedure(s) Performed: OPEN REDUCTION INTERNAL FIXATION (ORIF) ELBOW/OLECRANON FRACTURE (Left Elbow)     Patient location during evaluation: PACU Anesthesia Type: General Level of consciousness: awake Pain management: pain level controlled Vital Signs Assessment: post-procedure vital signs reviewed and stable Respiratory status: spontaneous breathing Cardiovascular status: stable Postop Assessment: no apparent nausea or vomiting Anesthetic complications: no   No complications documented.  Last Vitals:  Vitals:   10/23/20 1900 10/23/20 1915  BP: (!) 172/148 (!) 183/116  Pulse:  87  Resp:  13  Temp:    SpO2:  99%    Last Pain:  Vitals:   10/23/20 1900  TempSrc:   PainSc: 10-Worst pain ever                 Mallery Harshman

## 2020-10-24 ENCOUNTER — Encounter (HOSPITAL_COMMUNITY): Payer: Self-pay | Admitting: Orthopedic Surgery

## 2020-11-01 ENCOUNTER — Emergency Department (HOSPITAL_COMMUNITY)
Admission: EM | Admit: 2020-11-01 | Discharge: 2020-11-01 | Disposition: A | Discharge status: Home / Self Care | Payer: Medicaid Other | Attending: Emergency Medicine | Admitting: Emergency Medicine

## 2020-11-01 ENCOUNTER — Emergency Department (HOSPITAL_COMMUNITY): Payer: Medicaid Other

## 2020-11-01 ENCOUNTER — Other Ambulatory Visit: Payer: Self-pay

## 2020-11-01 ENCOUNTER — Encounter (HOSPITAL_COMMUNITY): Payer: Self-pay | Admitting: Emergency Medicine

## 2020-11-01 DIAGNOSIS — Z4789 Encounter for other orthopedic aftercare: Secondary | ICD-10-CM | POA: Diagnosis not present

## 2020-11-01 DIAGNOSIS — R0602 Shortness of breath: Secondary | ICD-10-CM | POA: Diagnosis not present

## 2020-11-01 DIAGNOSIS — Z5321 Procedure and treatment not carried out due to patient leaving prior to being seen by health care provider: Secondary | ICD-10-CM | POA: Insufficient documentation

## 2020-11-01 LAB — BASIC METABOLIC PANEL
Anion gap: 12 (ref 5–15)
BUN: <5 mg/dL — ABNORMAL LOW (ref 6–20)
CO2: 24 mmol/L (ref 22–32)
Calcium: 9.1 mg/dL (ref 8.9–10.3)
Chloride: 106 mmol/L (ref 98–111)
Creatinine, Ser: 0.62 mg/dL (ref 0.44–1.00)
GFR, Estimated: >60 mL/min (ref >60)
Glucose, Bld: 101 mg/dL — ABNORMAL HIGH (ref 70–99)
Potassium: 3.9 mmol/L (ref 3.5–5.1)
Sodium: 142 mmol/L (ref 135–145)

## 2020-11-01 LAB — CBC
HCT: 32.3 % — ABNORMAL LOW (ref 36.0–46.0)
Hemoglobin: 11.3 g/dL — ABNORMAL LOW (ref 12.0–15.0)
MCH: 34.7 pg — ABNORMAL HIGH (ref 26.0–34.0)
MCHC: 35 g/dL (ref 30.0–36.0)
MCV: 99.1 fL (ref 80.0–100.0)
Platelets: 350 10*3/uL (ref 150–400)
RBC: 3.26 MIL/uL — ABNORMAL LOW (ref 3.87–5.11)
RDW: 13.8 % (ref 11.5–15.5)
WBC: 6 10*3/uL (ref 4.0–10.5)
nRBC: 0 % (ref 0.0–0.2)

## 2020-11-01 NOTE — ED Triage Notes (Signed)
Patient coming from home. Complaint of shortness of breath since yesterday. Patient also complaining of cast on left arm that feels too tight.

## 2020-11-01 NOTE — ED Notes (Signed)
Pt called for VS, no response.  °

## 2020-11-01 NOTE — ED Notes (Signed)
Pt called x2, no response.  ?

## 2020-11-12 ENCOUNTER — Ambulatory Visit: Payer: Medicaid Other | Attending: Orthopedic Surgery | Admitting: *Deleted

## 2020-11-12 DIAGNOSIS — R600 Localized edema: Secondary | ICD-10-CM | POA: Insufficient documentation

## 2020-11-12 DIAGNOSIS — M25622 Stiffness of left elbow, not elsewhere classified: Secondary | ICD-10-CM | POA: Insufficient documentation

## 2020-11-12 DIAGNOSIS — M25522 Pain in left elbow: Secondary | ICD-10-CM | POA: Insufficient documentation

## 2020-11-12 DIAGNOSIS — M6281 Muscle weakness (generalized): Secondary | ICD-10-CM | POA: Insufficient documentation

## 2020-11-12 DIAGNOSIS — R278 Other lack of coordination: Secondary | ICD-10-CM | POA: Insufficient documentation

## 2020-11-13 ENCOUNTER — Encounter: Payer: Self-pay | Admitting: *Deleted

## 2020-11-13 ENCOUNTER — Other Ambulatory Visit: Payer: Self-pay

## 2020-11-13 ENCOUNTER — Ambulatory Visit: Payer: Medicaid Other | Admitting: Occupational Therapy

## 2020-11-13 DIAGNOSIS — M6281 Muscle weakness (generalized): Secondary | ICD-10-CM | POA: Diagnosis present

## 2020-11-13 DIAGNOSIS — R278 Other lack of coordination: Secondary | ICD-10-CM

## 2020-11-13 DIAGNOSIS — R600 Localized edema: Secondary | ICD-10-CM | POA: Diagnosis present

## 2020-11-13 DIAGNOSIS — M25622 Stiffness of left elbow, not elsewhere classified: Secondary | ICD-10-CM

## 2020-11-13 DIAGNOSIS — M25522 Pain in left elbow: Secondary | ICD-10-CM

## 2020-11-13 NOTE — Patient Instructions (Signed)
  WEARING SCHEDULE:  Wear splint at ALL times except for hygiene care and exercises below (May remove splint for exercises and then immediately place back - see below for exercises)   PURPOSE:  To prevent movement and for protection until injury can heal  CARE OF SPLINT:  Keep splint away from heat sources including: stove, radiator or furnace, or a car in sunlight. The splint can melt and will no longer fit you properly  Keep away from pets and children  Clean the splint with rubbing alcohol 1-2 times per day.  * During this time, make sure you also clean your hand/arm as instructed by your therapist and/or perform dressing changes as needed. Then dry hand/arm completely before replacing splint. (When cleaning hand/arm, keep it immobilized in same position until splint is replaced)  PRECAUTIONS/POTENTIAL PROBLEMS: *If you notice or experience increased pain, swelling, numbness, or a lingering reddened area from the splint: Contact your therapist immediately by calling [phone]. You must wear the splint for protection, but we will get you scheduled for adjustments as quickly as possible.  (If only straps or hooks need to be replaced and NO adjustments to the splint need to be made, just call the office ahead and let them know you are coming in)  If you have any medical concerns or signs of infection, please call your doctor immediately  AROM: Elbow Flexion / Extension    Gently bend elbow as far as possible. Then straighten arm as far as possible. Repeat _10-15___ times per set. Do _3___ sessions per day.    Elbow / Wrist Supination / Pronation   Bend elbow(s) to 90 and hold close to body. Turn palm(s) up. Then turn palm(s) down. Keep wrist straight. Repeat sequence _10-15___ times per session. Do _3___ sessions per day.  Elbow: Flexion   Use other hand to bend elbow with thumb toward the same shoulder. Do NOT force this motion. Hold _10___ seconds. Repeat _5-10___ times. Do  _3___ sessions per day. CAUTION: Movement should be gentle, steady and slow.  Elbow: Extension   Place thick telephone book on table and rest upper arm on it. Grasp forearm with other hand and use a steady downward and outward pull to straighten elbow. Hold _10___ seconds. Repeat __5-10__ times. Do _3___ sessions per day. CAUTION: Stretch slowly and gently. Do not force joint.  Supination (Passive)   Keep elbow bent at right angle and held firmly at side. Use other hand to turn forearm until palm faces upward. Hold __10__ seconds. Repeat _5-10___ times. Do 3___ sessions per day.   Pronation (Passive)   Keep elbow bent at right angle and held firmly to side. Use other hand to turn forearm until palm faces downward. Hold __10__ seconds. Repeat __5-10__ times. Do __3__ sessions per day.

## 2020-11-13 NOTE — Therapy (Signed)
[facility] [address] [place], [place], [address] Phone: [phone]   Fax:  [phone]  Occupational Therapy Evaluation  Patient Details  Name: Jessica Case MRN: [id number] Date of Birth: Feb 03, 1981 Referring Provider (OT): Dr Caralyn Guile   Encounter Date: 11/13/2020   OT End of Session - 11/13/20 1151    Visit Number 1    Number of Visits 19    Date for OT Re-Evaluation 12/11/20    Authorization Type Medicaid [place] Access - requesting Eval plus 3 visits followed by 1-2x/week for 8 weeks - 19 visits total.    Authorization - Visit Number 1   Awaiting authorization. Eval 11/13/2020   OT Start Time 1017    OT Stop Time 1131    OT Time Calculation (min) 74 min    Activity Tolerance Patient tolerated treatment well;Patient limited by fatigue;Other (comment)   restless, difficulty following commands/instructions, closing eyes off and on during assessment today. Pt father was educated in all splint wear, care and precautions and HEP   Behavior During Therapy Restless;Impulsive   See note above          Past Medical History:  Diagnosis Date  . Alcohol withdrawal seizure ([place]) 09/24/2020  . Arthritis   . Claustrophobia   . Cold    currently - no meds  . Fallopian tube abscess   . Fibroids   . Hypertension    "some times" -   . Paranoid schizophrenia ([place])   . Pneumonia 09/24/2020  . SVD (spontaneous vaginal delivery)    x 1    Past Surgical History:  Procedure Laterality Date  . BUNIONECTOMY Bilateral   . LAPAROSCOPIC UNILATERAL SALPINGECTOMY Left 08/18/2016   Procedure: LAPAROSCOPIC UNILATERAL SALPINGECTOMY;  Surgeon: Lavonia Drafts, MD;  Location: [facility];  Service: Gynecology;  Laterality: Left;  . LAPAROSCOPY  08/18/2016   Procedure: LAPAROSCOPY OPERATIVE;  Surgeon: Lavonia Drafts, MD;  Location: [facility];  Service: Gynecology;;  . LAPAROTOMY  08/18/2016   Procedure: LAPAROTOMY;   Surgeon: Lavonia Drafts, MD;  Location: [facility];  Service: Gynecology;;  mini laparotomy  . MYOMECTOMY  08/18/2016   Procedure: MYOMECTOMY;  Surgeon: Lavonia Drafts, MD;  Location: [facility];  Service: Gynecology;;  . ORIF ELBOW FRACTURE Left 10/23/2020   Procedure: OPEN REDUCTION INTERNAL FIXATION (ORIF) ELBOW/OLECRANON FRACTURE;  Surgeon: Iran Planas, MD;  Location: [facility];  Service: Orthopedics;  Laterality: Left;  . TOOTH EXTRACTION      There were no vitals filed for this visit.   Subjective Assessment - 11/13/20 1022    Subjective  Pt is a 40 y/o female whom fell and fractureed her left olecranon. She had surgery on 10/23/2020 and presents today for protective splinting. Chart review indicates that DOS was several weeks following her initial injury - pt was unable to remember DOI.    Patient is accompanied by: Family member   Father - Artist   Pertinent History Pt has PMH of Paranoid schizophrenia, claustrophobia, arthritis, hx of ETOH and drug abuse in the past. Please refer to chart for complete PMH and details.    Limitations L Elbow ORIF following olecranon fracture: DOS: 10/23/2020 - protective splinting. May begin Active and passive ROM left elbow ASAP per MD orders.    Patient Stated Goals I just wnat my arm to get better.    Currently in Pain? Yes    Pain Score 8     Pain Location Elbow    Pain Orientation Left  Pain Descriptors / Indicators Aching;Sharp    Pain Type Acute pain    Pain Onset 1 to 4 weeks ago    Pain Frequency Constant    Aggravating Factors  This "Cast Makes it worse" re- Post-op cast    Pain Relieving Factors None per pt report except pain medication    Multiple Pain Sites No             [facility] OT Assessment - 11/13/20 0001      Assessment   Medical Diagnosis Left Olecranon Fracture w/ ORIF    Referring Provider (OT) Dr Caralyn Guile    Onset Date/Surgical Date 10/23/20    Hand Dominance Right    Next MD Visit 3 weeks per pt report       Home  Environment   Family/patient expects to be discharged to: Private residence    Living Arrangements Non-relatives/Friends    Available Help at Discharge Friend(s)    Type of [place] With Friend(s)      Prior [place] Unemployed   in [place] [employer]      ADL   ADL comments Pt reports Min A for ADL's at this time.      Mobility   Mobility Status Independent      Written Expression   Dominant Hand Right      Cognition   Overall Cognitive Status Difficult to assess   Suspect this is pt baseline following chart review   Behaviors Restless      Sensation   Light Touch Appears Intact   Intact to LT digits 1-5     Edema   Edema --   Mild edema upon visual comparision to right dominant hand                   OT Treatments/Exercises (OP) - 11/13/20 0001      ADLs   ADL Comments reviewed ADL's with patient and her father, Orpah Greek. She was also instructed in splinting use, care and precautions during HEP and ADL's.      Exercises   Exercises Elbow;Wrist      Elbow Exercises   Other elbow exercises Pt was educated in formal HEP to include active and passive elbow flexion, extension, forearm pronation, supination, wrist flexion, extension. She was noted to have full composite fist left UE. HEP was reviewed/performed in the clinic setting with both the patient and her father, Orpah Greek, as pt was noted to be sleepy, and restless during this session.      Splinting   Splinting Pt's post-op forearm splint was removed in the clinic and she was fitted with a custom long arm, posterior elbow splint that places her elbow in ~90* flexion and her forearm in neutral position. Pt and her father were educated in splinting use, care and precautions. Pt was noted to be restless and had a difficult time paying attention throughout the session. She was given verbal, written instruction and performed all of the above in the clinic today.Pt was then  instructed in HEP as above.                 OT Education - 11/13/20 1109    Education Details hygiene care, precautions, splint wear and care, initial A/ROM and P/ROM HEP    Person(s) Educated Patient    Methods Explanation;Demonstration;Verbal cues;Handout    Comprehension Verbalized understanding;Returned demonstration;Verbal cues required;Need further instruction  OT Short Term Goals - 11/13/20 1212      OT SHORT TERM GOAL #1   Title Pt will be Mod I splint use, care and precautions left UE/elbow    Baseline Min VC's at [facility] on 11/13/2020    Time 4    Period Weeks    Status New    Target Date 12/11/20      OT SHORT TERM GOAL #2   Title Pt will be Mod I initial HEP for elbow activ eand passive ROM left UE    Baseline Min VC's and TC's required for follow through    Time 4    Period Weeks    Status New    Target Date 12/11/20      OT SHORT TERM GOAL #3   Title Pt will be Mod I left elbow scar management, desenstization and massage as observed in clinic setting.    Baseline Dependent    Time 4    Period Weeks    Status New    Target Date 12/11/20             OT Long Term Goals - 11/13/20 1216      OT LONG TERM GOAL #1   Title Pt will be Mod I updated HEP left UE/Elbow as observed in clinic setting    Baseline Dependent    Time 8    Period Weeks    Status New    Target Date 01/08/21      OT LONG TERM GOAL #2   Title Pt will be Mod I using left UE as non-dominant assist during simulated ADL's and light functional activity.    Baseline Min A for all ADL's    Time 8    Period Weeks    Status New    Target Date 01/08/21      OT LONG TERM GOAL #3   Title Pt will report improved pain in left UE as seen by pain rating as 3/10 or less during light ADL's or active ROM.    Baseline 10/10 at Eval 11/13/2020    Time 8    Period Weeks    Status New    Target Date 01/08/21      OT LONG TERM GOAL #4   Title Pt will demonstrate active elbow ROM  (Flexion/extension, pronation/supination) within functional limits as per goniometer assessment in clinic    Baseline unable/impaired    Time 8    Period Weeks    Status New    Target Date 01/08/21      OT LONG TERM GOAL #5   Title Pt will demonstrate improved functional use as seen by left grip strength of 10# or greater for functional activity/ADL's    Baseline unable    Time 8    Period Weeks    Status New    Target Date 01/08/21                 Plan - 11/13/20 1154    Clinical Impression Statement Pt is a 40 y/o right hand dominant female s/p left elbow fracture with ORIF to her olecranon process (ICD: S52.022A). DOS: 10/23/2020 per Dr Caralyn Guile. She is currently 3 weeks post-op today and plans to follow up with Dr Caralyn Guile in about 3 weeks per her father, Orpah Greek, whom was present in the clinic with her today. Her PMH includes, but is not limited to: paranoid schizophrenia, claustrophobia, arthritis, and ETOH and drug abuse. She presented today for protective splinting  and HEP instruction. Pt was fitted with a custom long arm, posterior elbow splint that places her left elbow in approximately 90* flexion and her forearm in neutral position. She was also instructed in HEP to consist of active and passsive elbow flexion, extension, forearm pronation/supination per MD orders. She reports paresthesias in her digits however she was intact to light touch in the clinic today, will cont to monitor. She presents with deficits in the areas of active and passive ROM left UE which impacts her ability to perform ADL's and all functional activity. She also reports 8/10 pain, has min edema along her dorsal elbow and a well healed scar with steri-strips still attached. She should benefit from continued out-pt OT to address deficits and assist with splint modifications, progression of HEP and overall functional use of left UE.    OT Occupational Profile and History Problem Focused Assessment - Including  review of records relating to presenting problem    Occupational performance deficits (Please refer to evaluation for details): ADL's    Body Structure / Function / Physical Skills ADL;Strength;Pain;UE functional use;Edema;ROM;Scar mobility;Coordination;Flexibility;Decreased knowledge of precautions;FMC    Rehab Potential Fair    Clinical Decision Making Limited treatment options, no task modification necessary    Comorbidities Affecting Occupational Performance: May have comorbidities impacting occupational performance    Modification or Assistance to Complete Evaluation  Min-Moderate modification of tasks or assist with assess necessary to complete eval    OT Frequency 2x / week   Eval, plus 3 visits followed by 2x/week for 8 weeks   OT Duration 8 weeks    OT Treatment/Interventions Self-care/ADL training;Moist Heat;Splinting;Therapeutic activities;Ultrasound;Therapeutic exercise;Scar mobilization;Cryotherapy;Passive range of motion;Patient/family education;Manual Therapy;Paraffin    Plan Splint check & adjustments, review HEP left UE. Check for medicaid authorization.    Consulted and Agree with Plan of Care Patient;Family member/caregiver    Family Member Consulted Father, Orpah Greek           Patient will benefit from skilled therapeutic intervention in order to improve the following deficits and impairments:   Body Structure / Function / Physical Skills: ADL,Strength,Pain,UE functional use,Edema,ROM,Scar mobility,Coordination,Flexibility,Decreased knowledge of precautions,FMC      WEARING SCHEDULE:  Wear splint at ALL times except for hygiene care and exercises below (May remove splint for exercises and then immediately place back - see below for exercises)   PURPOSE:  To prevent movement and for protection until injury can heal  CARE OF SPLINT:  Keep splint away from heat sources including: stove, radiator or furnace, or a car in sunlight. The splint can melt and will no longer fit  you properly  Keep away from pets and children  Clean the splint with rubbing alcohol 1-2 times per day.  * During this time, make sure you also clean your hand/arm as instructed by your therapist and/or perform dressing changes as needed. Then dry hand/arm completely before replacing splint. (When cleaning hand/arm, keep it immobilized in same position until splint is replaced)  PRECAUTIONS/POTENTIAL PROBLEMS: *If you notice or experience increased pain, swelling, numbness, or a lingering reddened area from the splint: Contact your therapist immediately by calling [phone]. You must wear the splint for protection, but we will get you scheduled for adjustments as quickly as possible.  (If only straps or hooks need to be replaced and NO adjustments to the splint need to be made, just call the office ahead and let them know you are coming in)  If you have any medical concerns or signs of infection, please  call your doctor immediately  AROM: Elbow Flexion / Extension    Gently bend elbow as far as possible. Then straighten arm as far as possible. Repeat _10-15___ times per set. Do _3___ sessions per day.    Elbow / Wrist Supination / Pronation   Bend elbow(s) to 90 and hold close to body. Turn palm(s) up. Then turn palm(s) down. Keep wrist straight. Repeat sequence _10-15___ times per session. Do _3___ sessions per day.  Elbow: Flexion   Use other hand to bend elbow with thumb toward the same shoulder. Do NOT force this motion. Hold _10___ seconds. Repeat _5-10___ times. Do _3___ sessions per day. CAUTION: Movement should be gentle, steady and slow.  Elbow: Extension   Place thick telephone book on table and rest upper arm on it. Grasp forearm with other hand and use a steady downward and outward pull to straighten elbow. Hold _10___ seconds. Repeat __5-10__ times. Do _3___ sessions per day. CAUTION: Stretch slowly and gently. Do not force joint.  Supination  (Passive)   Keep elbow bent at right angle and held firmly at side. Use other hand to turn forearm until palm faces upward. Hold __10__ seconds. Repeat _5-10___ times. Do 3___ sessions per day.   Pronation (Passive)   Keep elbow bent at right angle and held firmly to side. Use other hand to turn forearm until palm faces downward. Hold __10__ seconds. Repeat __5-10__ times. Do __3__ sessions per day.                       Visit Diagnosis: Stiffness of left elbow, not elsewhere classified - Plan: Ot plan of care cert/re-cert  Pain in left elbow - Plan: Ot plan of care cert/re-cert  Other lack of coordination - Plan: Ot plan of care cert/re-cert  Muscle weakness (generalized) - Plan: Ot plan of care cert/re-cert  Localized edema - Plan: Ot plan of care cert/re-cert      Problem List Patient Active Problem List   Diagnosis Date Noted  . Alcohol withdrawal seizure ([facility]) 09/24/2020  . CAP (community acquired pneumonia) 09/24/2020  . Alcohol dependence ([place]) 09/24/2020  . Elevated LFTs 09/24/2020  . Asthma 04/15/2020  . Paranoid schizophrenia ([place]) 10/31/2017  . Right foot pain 05/11/2017  . Essential hypertension 10/08/2016  . Cocaine substance abuse ([place]) 10/08/2016  . Fibroids, subserous 08/18/2016  . Hydrosalpinx 04/13/2016  . Tobacco dependency 09/04/2015  . Migraine with status migrainosus 08/15/2015  . Allergic rhinitis 07/18/2015  . Pain due to dental caries 07/12/2014    Percell Miller Ardath Sax, OTR/L 11/13/2020, 12:27 PM  [facility] [address] [place], [place], [address] Phone: [phone]   Fax:  [phone]  Name: Jessica Case MRN: [id number] Date of Birth: February 09, 1981

## 2020-11-21 ENCOUNTER — Ambulatory Visit: Payer: Medicaid Other | Attending: Nurse Practitioner | Admitting: Occupational Therapy

## 2020-11-21 ENCOUNTER — Other Ambulatory Visit: Payer: Self-pay

## 2020-11-21 DIAGNOSIS — M6281 Muscle weakness (generalized): Secondary | ICD-10-CM | POA: Diagnosis present

## 2020-11-21 DIAGNOSIS — R600 Localized edema: Secondary | ICD-10-CM | POA: Diagnosis present

## 2020-11-21 DIAGNOSIS — M25622 Stiffness of left elbow, not elsewhere classified: Secondary | ICD-10-CM | POA: Insufficient documentation

## 2020-11-21 DIAGNOSIS — R278 Other lack of coordination: Secondary | ICD-10-CM | POA: Diagnosis present

## 2020-11-21 DIAGNOSIS — M25522 Pain in left elbow: Secondary | ICD-10-CM | POA: Diagnosis present

## 2020-11-21 NOTE — Therapy (Signed)
[facility] [address] [place], [place], [address] Phone: [phone]   Fax:  [phone]  Occupational Therapy Treatment  Patient Details  Name: Jessica Case MRN: [id number] Date of Birth: 1980/12/16 Referring Provider (OT): Dr Caralyn Guile   Encounter Date: 11/21/2020   OT End of Session - 11/21/20 1207    Visit Number 2    Number of Visits 19    Date for OT Re-Evaluation 12/11/20    Authorization Type Medicaid [place] Access - requesting Eval plus 3 visits followed by 1-2x/week for 8 weeks - 19 visits total.    Authorization Time Period 3 visits appproved through 12/11/20    Authorization - Visit Number 1   previous visit was eval   Authorization - Number of Visits 3    OT Start Time 1150    OT Stop Time 1230    OT Time Calculation (min) 40 min           Past Medical History:  Diagnosis Date  . Alcohol withdrawal seizure ([facility]) 09/24/2020  . Arthritis   . Claustrophobia   . Cold    currently - no meds  . Fallopian tube abscess   . Fibroids   . Hypertension    "some times" -   . Paranoid schizophrenia ([place])   . Pneumonia 09/24/2020  . SVD (spontaneous vaginal delivery)    x 1    Past Surgical History:  Procedure Laterality Date  . BUNIONECTOMY Bilateral   . LAPAROSCOPIC UNILATERAL SALPINGECTOMY Left 08/18/2016   Procedure: LAPAROSCOPIC UNILATERAL SALPINGECTOMY;  Surgeon: Lavonia Drafts, MD;  Location: [facility];  Service: Gynecology;  Laterality: Left;  . LAPAROSCOPY  08/18/2016   Procedure: LAPAROSCOPY OPERATIVE;  Surgeon: Lavonia Drafts, MD;  Location: [facility];  Service: Gynecology;;  . LAPAROTOMY  08/18/2016   Procedure: LAPAROTOMY;  Surgeon: Lavonia Drafts, MD;  Location: [facility];  Service: Gynecology;;  mini laparotomy  . MYOMECTOMY  08/18/2016   Procedure: MYOMECTOMY;  Surgeon: Lavonia Drafts, MD;  Location: [facility];  Service: Gynecology;;  . ORIF ELBOW FRACTURE  Left 10/23/2020   Procedure: OPEN REDUCTION INTERNAL FIXATION (ORIF) ELBOW/OLECRANON FRACTURE;  Surgeon: Iran Planas, MD;  Location: [facility];  Service: Orthopedics;  Laterality: Left;  . TOOTH EXTRACTION      There were no vitals filed for this visit.   Subjective Assessment - 11/21/20 1200    Subjective  Pt reports elbow pain    Pertinent History Pt has PMH of Paranoid schizophrenia, claustrophobia, arthritis, hx of ETOH and drug abuse in the past. Please refer to chart for complete PMH and details.    Limitations L Elbow ORIF following olecranon fracture: DOS: 10/23/2020 - protective splinting. May begin Active and passive ROM left elbow ASAP per MD orders.    Patient Stated Goals I just wnat my arm to get better.                 Treatment: hotpack applied to left elbow x 10 mins with pt performing wrist flexion/ extension, digital ROM and forearm supination/ pronation while applied no adverse reactions.  Therapsit assisted wpt with bathing arm at sink with washcloth. Pt was instructed not to submerge her arm at home, only to wash with a washcloth around incsion.Reviewed A/ROM, and P/ROM HEP issued last visit, pt is limited by pain and requires distraction. Shoulder flexion, and circumduction. Arm bike x 5 mins level 1 for reciprocal movement. Splint is fitting well with no problems, new stockinette was provided.  OT Education - 11/21/20 1305    Education Details hygiene care, precautions, splint wear and care, initial A/ROM and P/ROM HEP reveiw, shoulder A/ROM    Person(s) Educated Patient    Methods Explanation;Demonstration;Verbal cues    Comprehension Verbalized understanding;Returned demonstration;Verbal cues required            OT Short Term Goals - 11/13/20 1212      OT SHORT TERM GOAL #1   Title Pt will be Mod I splint use, care and precautions left UE/elbow    Baseline Min VC's at Eval on 11/13/2020    Time 4    Period Weeks    Status New     Target Date 12/11/20      OT SHORT TERM GOAL #2   Title Pt will be Mod I initial HEP for elbow activ eand passive ROM left UE    Baseline Min VC's and TC's required for follow through    Time 4    Period Weeks    Status New    Target Date 12/11/20      OT SHORT TERM GOAL #3   Title Pt will be Mod I left elbow scar management, desenstization and massage as observed in clinic setting.    Baseline Dependent    Time 4    Period Weeks    Status New    Target Date 12/11/20             OT Long Term Goals - 11/13/20 1216      OT LONG TERM GOAL #1   Title Pt will be Mod I updated HEP left UE/Elbow as observed in clinic setting    Baseline Dependent    Time 8    Period Weeks    Status New    Target Date 01/08/21      OT LONG TERM GOAL #2   Title Pt will be Mod I using left UE as non-dominant assist during simulated ADL's and light functional activity.    Baseline Min A for all ADL's    Time 8    Period Weeks    Status New    Target Date 01/08/21      OT LONG TERM GOAL #3   Title Pt will report improved pain in left UE as seen by pain rating as 3/10 or less during light ADL's or active ROM.    Baseline 10/10 at Eval 11/13/2020    Time 8    Period Weeks    Status New    Target Date 01/08/21      OT LONG TERM GOAL #4   Title Pt will demonstrate active elbow ROM (Flexion/extension, pronation/supination) within functional limits as per goniometer assessment in clinic    Baseline unable/impaired    Time 8    Period Weeks    Status New    Target Date 01/08/21      OT LONG TERM GOAL #5   Title Pt will demonstrate improved functional use as seen by left grip strength of 10# or greater for functional activity/ADL's    Baseline unable    Time 8    Period Weeks    Status New    Target Date 01/08/21                 Plan - 11/21/20 1305    Clinical Impression Statement Pt arrived wearing her splint. Therapist reviewed HEP with pt. Pt remains limited by pain.    OT  Occupational Profile and History  Problem Focused Assessment - Including review of records relating to presenting problem    Occupational performance deficits (Please refer to evaluation for details): ADL's    Body Structure / Function / Physical Skills ADL;Strength;Pain;UE functional use;Edema;ROM;Scar mobility;Coordination;Flexibility;Decreased knowledge of precautions;FMC    Rehab Potential Fair    Clinical Decision Making Limited treatment options, no task modification necessary    Comorbidities Affecting Occupational Performance: May have comorbidities impacting occupational performance    Modification or Assistance to Complete Evaluation  Min-Moderate modification of tasks or assist with assess necessary to complete eval    OT Frequency 2x / week   Eval, plus 3 visits followed by 2x/week for 8 weeks   OT Duration 8 weeks    OT Treatment/Interventions Self-care/ADL training;Moist Heat;Splinting;Therapeutic activities;Ultrasound;Therapeutic exercise;Scar mobilization;Cryotherapy;Passive range of motion;Patient/family education;Manual Therapy;Paraffin    Plan continue with A/ROM, P/ROM, arm bike, hotpack    Consulted and Agree with Plan of Care Patient           Patient will benefit from skilled therapeutic intervention in order to improve the following deficits and impairments:   Body Structure / Function / Physical Skills: ADL,Strength,Pain,UE functional use,Edema,ROM,Scar mobility,Coordination,Flexibility,Decreased knowledge of precautions,FMC       Visit Diagnosis: Stiffness of left elbow, not elsewhere classified  Pain in left elbow  Other lack of coordination  Muscle weakness (generalized)  Localized edema    Problem List Patient Active Problem List   Diagnosis Date Noted  . Alcohol withdrawal seizure ([place]) 09/24/2020  . CAP (community acquired pneumonia) 09/24/2020  . Alcohol dependence ([place]) 09/24/2020  . Elevated LFTs 09/24/2020  . Asthma 04/15/2020  . Paranoid  schizophrenia ([place]) 10/31/2017  . Right foot pain 05/11/2017  . Essential hypertension 10/08/2016  . Cocaine substance abuse ([place]) 10/08/2016  . Fibroids, subserous 08/18/2016  . Hydrosalpinx 04/13/2016  . Tobacco dependency 09/04/2015  . Migraine with status migrainosus 08/15/2015  . Allergic rhinitis 07/18/2015  . Pain due to dental caries 07/12/2014    Amir Fick 11/21/2020, 1:08 PM  [facility] [address] [place], [place], [address] Phone: [phone]   Fax:  [phone]  Name: Beyla Loney MRN: [id number] Date of Birth: 1981/08/04

## 2020-11-22 ENCOUNTER — Inpatient Hospital Stay: Payer: Medicaid Other | Admitting: Internal Medicine

## 2020-11-26 ENCOUNTER — Other Ambulatory Visit: Payer: Self-pay

## 2020-11-26 ENCOUNTER — Ambulatory Visit: Payer: Medicaid Other | Admitting: Occupational Therapy

## 2020-11-26 DIAGNOSIS — M25622 Stiffness of left elbow, not elsewhere classified: Secondary | ICD-10-CM

## 2020-11-26 DIAGNOSIS — M6281 Muscle weakness (generalized): Secondary | ICD-10-CM

## 2020-11-26 DIAGNOSIS — M25522 Pain in left elbow: Secondary | ICD-10-CM

## 2020-11-26 NOTE — Therapy (Signed)
[facility] [address] [place], [place], [address] Phone: [phone]   Fax:  [phone]  Occupational Therapy Treatment  Patient Details  Name: Jessica Case MRN: [id number] Date of Birth: Mar 18, 1981 Referring Provider (OT): Dr Caralyn Guile   Encounter Date: 11/26/2020   OT End of Session - 11/26/20 1221    Visit Number 3    Number of Visits 19    Date for OT Re-Evaluation 12/11/20    Authorization Type Medicaid [place] Access - requesting Eval plus 3 visits followed by 1-2x/week for 8 weeks - 19 visits total.    Authorization Time Period 3 visits appproved through 12/11/20    Authorization - Visit Number 2   previous visit was eval   Authorization - Number of Visits 3    OT Start Time 1100    OT Stop Time 1130    OT Time Calculation (min) 30 min    Activity Tolerance Patient limited by fatigue;Other (comment);Patient limited by pain    Behavior During Therapy Restless;Impulsive           Past Medical History:  Diagnosis Date  . Alcohol withdrawal seizure ([place]) 09/24/2020  . Arthritis   . Claustrophobia   . Cold    currently - no meds  . Fallopian tube abscess   . Fibroids   . Hypertension    "some times" -   . Paranoid schizophrenia ([place])   . Pneumonia 09/24/2020  . SVD (spontaneous vaginal delivery)    x 1    Past Surgical History:  Procedure Laterality Date  . BUNIONECTOMY Bilateral   . LAPAROSCOPIC UNILATERAL SALPINGECTOMY Left 08/18/2016   Procedure: LAPAROSCOPIC UNILATERAL SALPINGECTOMY;  Surgeon: Lavonia Drafts, MD;  Location: [facility];  Service: Gynecology;  Laterality: Left;  . LAPAROSCOPY  08/18/2016   Procedure: LAPAROSCOPY OPERATIVE;  Surgeon: Lavonia Drafts, MD;  Location: [facility];  Service: Gynecology;;  . LAPAROTOMY  08/18/2016   Procedure: LAPAROTOMY;  Surgeon: Lavonia Drafts, MD;  Location: [facility];  Service: Gynecology;;  mini laparotomy  . MYOMECTOMY   08/18/2016   Procedure: MYOMECTOMY;  Surgeon: Lavonia Drafts, MD;  Location: [facility];  Service: Gynecology;;  . ORIF ELBOW FRACTURE Left 10/23/2020   Procedure: OPEN REDUCTION INTERNAL FIXATION (ORIF) ELBOW/OLECRANON FRACTURE;  Surgeon: Iran Planas, MD;  Location: [facility];  Service: Orthopedics;  Laterality: Left;  . TOOTH EXTRACTION      There were no vitals filed for this visit.   Subjective Assessment - 11/26/20 1106    Subjective  Pt reports elbow pain    Pertinent History Pt has PMH of Paranoid schizophrenia, claustrophobia, arthritis, hx of ETOH and drug abuse in the past. Please refer to chart for complete PMH and details.    Limitations L Elbow ORIF following olecranon fracture: DOS: 10/23/2020 - protective splinting. May begin Active and passive ROM left elbow ASAP per MD orders.    Patient Stated Goals I just wnat my arm to get better.    Currently in Pain? Yes    Pain Score 7     Pain Location Elbow    Pain Orientation Left    Pain Descriptors / Indicators Aching    Pain Type Acute pain    Pain Onset More than a month ago    Pain Frequency Intermittent    Aggravating Factors  hanging down    Pain Relieving Factors repositioning, elevation           Hot pack x 8 min Lt elbow. Performed A/ROM  in elbow flexion and extension followed by review of P/ROM. Pt performing functional reaching into cabinet to retrieve cones LUE for ROM. Forearm gym x 4 for wrist and forearm motion. Wall slides in sh flex/ext with elbow ext/flex followed by circumduction movements.  Supine: elbow ext x 10 against gravity. UBE x 5 min, level 1 w/o rest.                        OT Short Term Goals - 11/26/20 1222      OT SHORT TERM GOAL #1   Title Pt will be Mod I splint use, care and precautions left UE/elbow    Baseline Min VC's at [place] on 11/13/2020    Time 4    Period Weeks    Status Achieved    Target Date 12/11/20      OT SHORT TERM GOAL #2   Title Pt will be Mod I  initial HEP for elbow activ eand passive ROM left UE    Baseline Min VC's and TC's required for follow through    Time 4    Period Weeks    Status Achieved    Target Date 12/11/20      OT SHORT TERM GOAL #3   Title Pt will be Mod I left elbow scar management, desenstization and massage as observed in clinic setting.    Baseline Dependent    Time 4    Period Weeks    Status New    Target Date 12/11/20             OT Long Term Goals - 11/13/20 1216      OT LONG TERM GOAL #1   Title Pt will be Mod I updated HEP left UE/Elbow as observed in clinic setting    Baseline Dependent    Time 8    Period Weeks    Status New    Target Date 01/08/21      OT LONG TERM GOAL #2   Title Pt will be Mod I using left UE as non-dominant assist during simulated ADL's and light functional activity.    Baseline Min A for all ADL's    Time 8    Period Weeks    Status New    Target Date 01/08/21      OT LONG TERM GOAL #3   Title Pt will report improved pain in left UE as seen by pain rating as 3/10 or less during light ADL's or active ROM.    Baseline 10/10 at Eval 11/13/2020    Time 8    Period Weeks    Status New    Target Date 01/08/21      OT LONG TERM GOAL #4   Title Pt will demonstrate active elbow ROM (Flexion/extension, pronation/supination) within functional limits as per goniometer assessment in clinic    Baseline unable/impaired    Time 8    Period Weeks    Status New    Target Date 01/08/21      OT LONG TERM GOAL #5   Title Pt will demonstrate improved functional use as seen by left grip strength of 10# or greater for functional activity/ADL's    Baseline unable    Time 8    Period Weeks    Status New    Target Date 01/08/21                 Plan - 11/26/20 1222    Clinical Impression  Statement Pt has met STG #1 and #2. STG #3 cannot be addressed until steri strips fall off. Pt with overall increased tolerance but still limited by pain and fatigue.    OT  Occupational Profile and History Problem Focused Assessment - Including review of records relating to presenting problem    Occupational performance deficits (Please refer to evaluation for details): ADL's    Body Structure / Function / Physical Skills ADL;Strength;Pain;UE functional use;Edema;ROM;Scar mobility;Coordination;Flexibility;Decreased knowledge of precautions;FMC    Rehab Potential Fair    Clinical Decision Making Limited treatment options, no task modification necessary    Comorbidities Affecting Occupational Performance: May have comorbidities impacting occupational performance    Modification or Assistance to Complete Evaluation  Min-Moderate modification of tasks or assist with assess necessary to complete eval    OT Frequency 2x / week   Eval, plus 3 visits followed by 2x/week for 8 weeks   OT Duration 8 weeks    OT Treatment/Interventions Self-care/ADL training;Moist Heat;Splinting;Therapeutic activities;Ultrasound;Therapeutic exercise;Scar mobilization;Cryotherapy;Passive range of motion;Patient/family education;Manual Therapy;Paraffin    Plan continue with A/ROM, P/ROM, arm bike, hotpack, check on MCD authorization    Consulted and Agree with Plan of Care Patient           Patient will benefit from skilled therapeutic intervention in order to improve the following deficits and impairments:   Body Structure / Function / Physical Skills: ADL,Strength,Pain,UE functional use,Edema,ROM,Scar mobility,Coordination,Flexibility,Decreased knowledge of precautions,FMC       Visit Diagnosis: Stiffness of left elbow, not elsewhere classified  Pain in left elbow  Muscle weakness (generalized)    Problem List Patient Active Problem List   Diagnosis Date Noted  . Alcohol withdrawal seizure ([place]) 09/24/2020  . CAP (community acquired pneumonia) 09/24/2020  . Alcohol dependence ([place]) 09/24/2020  . Elevated LFTs 09/24/2020  . Asthma 04/15/2020  . Paranoid schizophrenia ([place])  10/31/2017  . Right foot pain 05/11/2017  . Essential hypertension 10/08/2016  . Cocaine substance abuse ([place]) 10/08/2016  . Fibroids, subserous 08/18/2016  . Hydrosalpinx 04/13/2016  . Tobacco dependency 09/04/2015  . Migraine with status migrainosus 08/15/2015  . Allergic rhinitis 07/18/2015  . Pain due to dental caries 07/12/2014    Carey Bullocks, OTR/L 11/26/2020, 12:24 PM  [facility] [address] [place], [place], [address] Phone: [phone]   Fax:  [phone]  Name: Jessica Case MRN: [id number] Date of Birth: [birth year]

## 2020-12-04 ENCOUNTER — Ambulatory Visit: Payer: Medicaid Other | Admitting: Occupational Therapy

## 2020-12-05 ENCOUNTER — Emergency Department (HOSPITAL_COMMUNITY)
Admission: EM | Admit: 2020-12-05 | Discharge: 2020-12-05 | Disposition: A | Discharge status: Home / Self Care | Payer: Medicaid Other | Attending: Emergency Medicine | Admitting: Emergency Medicine

## 2020-12-05 ENCOUNTER — Ambulatory Visit: Payer: Medicaid Other | Admitting: *Deleted

## 2020-12-05 DIAGNOSIS — Z5321 Procedure and treatment not carried out due to patient leaving prior to being seen by health care provider: Secondary | ICD-10-CM | POA: Diagnosis not present

## 2020-12-05 DIAGNOSIS — R29898 Other symptoms and signs involving the musculoskeletal system: Secondary | ICD-10-CM | POA: Diagnosis not present

## 2020-12-05 NOTE — ED Notes (Signed)
Pt seen walking out .  

## 2020-12-06 ENCOUNTER — Telehealth: Payer: Self-pay

## 2020-12-06 NOTE — Telephone Encounter (Signed)
Transition Care Management Unsuccessful Follow-up Telephone Call  Date of discharge and from where:  12/05/2020 from [facility]  Attempts:  1st Attempt  Reason for unsuccessful TCM follow-up call:  Unable to reach patient    Person that answered the phone stated that the number we are calling is not the correct number for patient.

## 2020-12-12 ENCOUNTER — Other Ambulatory Visit: Payer: Self-pay

## 2020-12-12 ENCOUNTER — Ambulatory Visit: Payer: Medicaid Other | Admitting: Occupational Therapy

## 2020-12-12 ENCOUNTER — Encounter: Payer: Self-pay | Admitting: *Deleted

## 2020-12-12 DIAGNOSIS — R278 Other lack of coordination: Secondary | ICD-10-CM

## 2020-12-12 DIAGNOSIS — M25622 Stiffness of left elbow, not elsewhere classified: Secondary | ICD-10-CM

## 2020-12-12 DIAGNOSIS — R600 Localized edema: Secondary | ICD-10-CM

## 2020-12-12 DIAGNOSIS — M25522 Pain in left elbow: Secondary | ICD-10-CM

## 2020-12-12 DIAGNOSIS — M6281 Muscle weakness (generalized): Secondary | ICD-10-CM

## 2020-12-12 NOTE — Therapy (Signed)
[facility] [address] [place], [place], [address] Phone: [phone]   Fax:  [phone]  Occupational Therapy Treatment  Patient Details  Name: Jessica Case MRN: [id number] Date of Birth: Feb 23, 1981 Referring Provider (OT): Dr Caralyn Guile   Encounter Date: 12/12/2020   OT End of Session - 12/12/20 1157    Visit Number 4    Number of Visits 19    Authorization Type Medicaid [place] Access - requesting Eval plus 3 visits followed by 1-2x/week for 8 weeks - 19 visits total.    Authorization Time Period 3 visits appproved through 12/11/20    Authorization - Visit Number 3   1 visit was Eval   Authorization - Number of Visits 3    OT Start Time 1103    OT Stop Time 1146    OT Time Calculation (min) 43 min    Activity Tolerance Other (comment);Patient limited by fatigue;Patient tolerated treatment well;Patient limited by pain    Behavior During Therapy Restless;Impulsive           Past Medical History:  Diagnosis Date  . Alcohol withdrawal seizure ([place]) 09/24/2020  . Arthritis   . Claustrophobia   . Cold    currently - no meds  . Fallopian tube abscess   . Fibroids   . Hypertension    "some times" -   . Paranoid schizophrenia ([place])   . Pneumonia 09/24/2020  . SVD (spontaneous vaginal delivery)    x 1    Past Surgical History:  Procedure Laterality Date  . BUNIONECTOMY Bilateral   . LAPAROSCOPIC UNILATERAL SALPINGECTOMY Left 08/18/2016   Procedure: LAPAROSCOPIC UNILATERAL SALPINGECTOMY;  Surgeon: Lavonia Drafts, MD;  Location: [facility];  Service: Gynecology;  Laterality: Left;  . LAPAROSCOPY  08/18/2016   Procedure: LAPAROSCOPY OPERATIVE;  Surgeon: Lavonia Drafts, MD;  Location: [facility];  Service: Gynecology;;  . LAPAROTOMY  08/18/2016   Procedure: LAPAROTOMY;  Surgeon: Lavonia Drafts, MD;  Location: [facility];  Service: Gynecology;;  mini laparotomy  . MYOMECTOMY  08/18/2016    Procedure: MYOMECTOMY;  Surgeon: Lavonia Drafts, MD;  Location: [facility];  Service: Gynecology;;  . ORIF ELBOW FRACTURE Left 10/23/2020   Procedure: OPEN REDUCTION INTERNAL FIXATION (ORIF) ELBOW/OLECRANON FRACTURE;  Surgeon: Iran Planas, MD;  Location: [facility];  Service: Orthopedics;  Laterality: Left;  . TOOTH EXTRACTION      There were no vitals filed for this visit.   Subjective Assessment - 12/12/20 1109    Subjective  Pt reports stiffness in left elbow. She is 7 weeks and 1 day post-op. She does not have her protective splint on and reports that she "Had to leave in [facility] in a messy situation" She states that she is going to try and get it.    Patient is accompanied by: Family member    Pertinent History Pt has PMH of Paranoid schizophrenia, claustrophobia, arthritis, hx of ETOH and drug abuse in the past. Please refer to chart for complete PMH and details.    Limitations L Elbow ORIF following olecranon fracture: DOS: 10/23/2020 - protective splinting. May begin Active and passive ROM left elbow ASAP per MD orders.    Patient Stated Goals I just wnat my arm to get better.    Currently in Pain? Yes    Pain Score 6     Pain Location Elbow    Pain Orientation Left    Pain Descriptors / Indicators Aching    Pain Type Acute pain    Pain  Onset More than a month ago    Pain Frequency Intermittent    Aggravating Factors  Hanging down    Pain Relieving Factors repositioning, elevating    Multiple Pain Sites No                        OT Treatments/Exercises (OP) - 12/12/20 0001      ADLs   ADL Comments Pt reports Mod I bathing, dressing, mainly using microwave dinners.    ADL Education Given --   Pt reports using left UE for assist B&D. Pt donn jacket x2 in treatment room at mod I level (jacket upside down 1st attempt). Discussed ADL's/functional reaching tasks to perform at home as pt is right hand dominant and may not always use left UE.     Exercises   Exercises  Elbow;Wrist      Elbow Exercises   Other elbow exercises Clothes pins - reaching to grasp red and yellow clothes pins and place on vertical post and then repeating x2.    Other elbow exercises Fuctional Reaching into cabinet to retrieve cones (x25) of various sizes, staking on countertop and placing back into cabinet x3 rest break between sets.      Additional Elbow Exercises   UBE (Upper Arm Bike) x62min level 1 no resistance   Required moderate verbal and tactile cues for positioning and completetion/attention to task.                 OT Education - 12/12/20 1156    Education Details hygiene care, precautions, splint wear and care, pt to try and get splint back as she presented without it today, A/ROM and P/ROM, ADL and functional reaching HEP reveiw, shoulder A/ROM    Person(s) Educated Patient    Methods Explanation;Demonstration;Verbal cues    Comprehension Verbalized understanding;Returned demonstration;Verbal cues required            OT Short Term Goals - 11/26/20 1222      OT SHORT TERM GOAL #1   Title Pt will be Mod I splint use, care and precautions left UE/elbow    Baseline Min VC's at [facility] on 11/13/2020    Time 4    Period Weeks    Status Achieved    Target Date 12/11/20      OT SHORT TERM GOAL #2   Title Pt will be Mod I initial HEP for elbow activ eand passive ROM left UE    Baseline Min VC's and TC's required for follow through    Time 4    Period Weeks    Status Achieved    Target Date 12/11/20      OT SHORT TERM GOAL #3   Title Pt will be Mod I left elbow scar management, desenstization and massage as observed in clinic setting.    Baseline Dependent    Time 4    Period Weeks    Status New    Target Date 12/11/20             OT Long Term Goals - 11/13/20 1216      OT LONG TERM GOAL #1   Title Pt will be Mod I updated HEP left UE/Elbow as observed in clinic setting    Baseline Dependent    Time 8    Period Weeks    Status New    Target  Date 01/08/21      OT LONG TERM GOAL #2   Title Pt will  be Mod I using left UE as non-dominant assist during simulated ADL's and light functional activity.    Baseline Min A for all ADL's    Time 8    Period Weeks    Status New    Target Date 01/08/21      OT LONG TERM GOAL #3   Title Pt will report improved pain in left UE as seen by pain rating as 3/10 or less during light ADL's or active ROM.    Baseline 10/10 at Eval 11/13/2020    Time 8    Period Weeks    Status New    Target Date 01/08/21      OT LONG TERM GOAL #4   Title Pt will demonstrate active elbow ROM (Flexion/extension, pronation/supination) within functional limits as per goniometer assessment in clinic    Baseline unable/impaired    Time 8    Period Weeks    Status New    Target Date 01/08/21      OT LONG TERM GOAL #5   Title Pt will demonstrate improved functional use as seen by left grip strength of 10# or greater for functional activity/ADL's    Baseline unable    Time 8    Period Weeks    Status New    Target Date 01/08/21                 Plan - 12/12/20 1159    Clinical Impression Statement Pt cont to be limited by pain. She has presented to clinic today without splint after leaving it somewhere. She will plan to call Dr Caralyn Guile office to f/u and see if she continues to require protective splinting left UE. Encourage light functional use and elbow flexion/extension. Pt is somewhat impulsive and requires verbal and tactile cues throughout session today.    OT Occupational Profile and History Problem Focused Assessment - Including review of records relating to presenting problem    Occupational performance deficits (Please refer to evaluation for details): ADL's    Body Structure / Function / Physical Skills ADL;Strength;Pain;UE functional use;Edema;ROM;Scar mobility;Coordination;Flexibility;Decreased knowledge of precautions;FMC    Rehab Potential Fair    Clinical Decision Making Limited treatment  options, no task modification necessary    Comorbidities Affecting Occupational Performance: May have comorbidities impacting occupational performance    Modification or Assistance to Complete Evaluation  Min-Moderate modification of tasks or assist with assess necessary to complete eval    OT Frequency 2x / week   Eval + 3 visits followed by 2x/week x8 weeks   OT Duration 8 weeks    OT Treatment/Interventions Self-care/ADL training;Moist Heat;Splinting;Therapeutic activities;Ultrasound;Therapeutic exercise;Scar mobilization;Cryotherapy;Passive range of motion;Patient/family education;Manual Therapy;Paraffin    Plan Continue with A/ROM, P/ROM, arm bike, hotpack and light functional use, ADL's. Check medicaid approval.    Consulted and Agree with Plan of Care Patient           Patient will benefit from skilled therapeutic intervention in order to improve the following deficits and impairments:   Body Structure / Function / Physical Skills: ADL,Strength,Pain,UE functional use,Edema,ROM,Scar mobility,Coordination,Flexibility,Decreased knowledge of precautions,FMC       Visit Diagnosis: Stiffness of left elbow, not elsewhere classified  Pain in left elbow  Muscle weakness (generalized)  Other lack of coordination  Localized edema    Problem List Patient Active Problem List   Diagnosis Date Noted  . Alcohol withdrawal seizure ([place]) 09/24/2020  . CAP (community acquired pneumonia) 09/24/2020  . Alcohol dependence ([place]) 09/24/2020  . Elevated LFTs 09/24/2020  .  Asthma 04/15/2020  . Paranoid schizophrenia ([place]) 10/31/2017  . Right foot pain 05/11/2017  . Essential hypertension 10/08/2016  . Cocaine substance abuse ([place]) 10/08/2016  . Fibroids, subserous 08/18/2016  . Hydrosalpinx 04/13/2016  . Tobacco dependency 09/04/2015  . Migraine with status migrainosus 08/15/2015  . Allergic rhinitis 07/18/2015  . Pain due to dental caries 07/12/2014    Percell Miller Ardath Sax,  OTR/L 12/12/2020, 12:08 PM  [facility] [address] [place], [place], [address] Phone: [phone]   Fax:  [phone]  Name: Jessica Case MRN: [id number] Date of Birth: [birth year]

## 2020-12-16 ENCOUNTER — Telehealth: Payer: Self-pay | Admitting: Nurse Practitioner

## 2020-12-16 NOTE — Telephone Encounter (Signed)
Attempt to call patient.  She has an upcoming apt.

## 2020-12-16 NOTE — Telephone Encounter (Signed)
Pt calling to ask for an order of Ensure. Pt states her health is bad and she does not have any money.  Pt states she needs some protein.  OK to ship or she can try to pick up.

## 2020-12-19 ENCOUNTER — Ambulatory Visit: Payer: Medicaid Other | Admitting: *Deleted

## 2020-12-26 ENCOUNTER — Ambulatory Visit: Payer: Medicaid Other | Admitting: Occupational Therapy

## 2021-01-02 ENCOUNTER — Ambulatory Visit: Payer: Medicaid Other | Attending: Nurse Practitioner | Admitting: *Deleted

## 2021-01-27 ENCOUNTER — Encounter: Payer: Medicaid Other | Admitting: Nurse Practitioner

## 2021-02-10 IMAGING — CR DG CHEST 2V
2 series · 2 of 2 positions shown · non-contrast
Comparison: September 23, 2020.

CLINICAL DATA: Shortness of breath.  Chest pain.

EXAM:
CHEST - 2 VIEW

[chest pa]
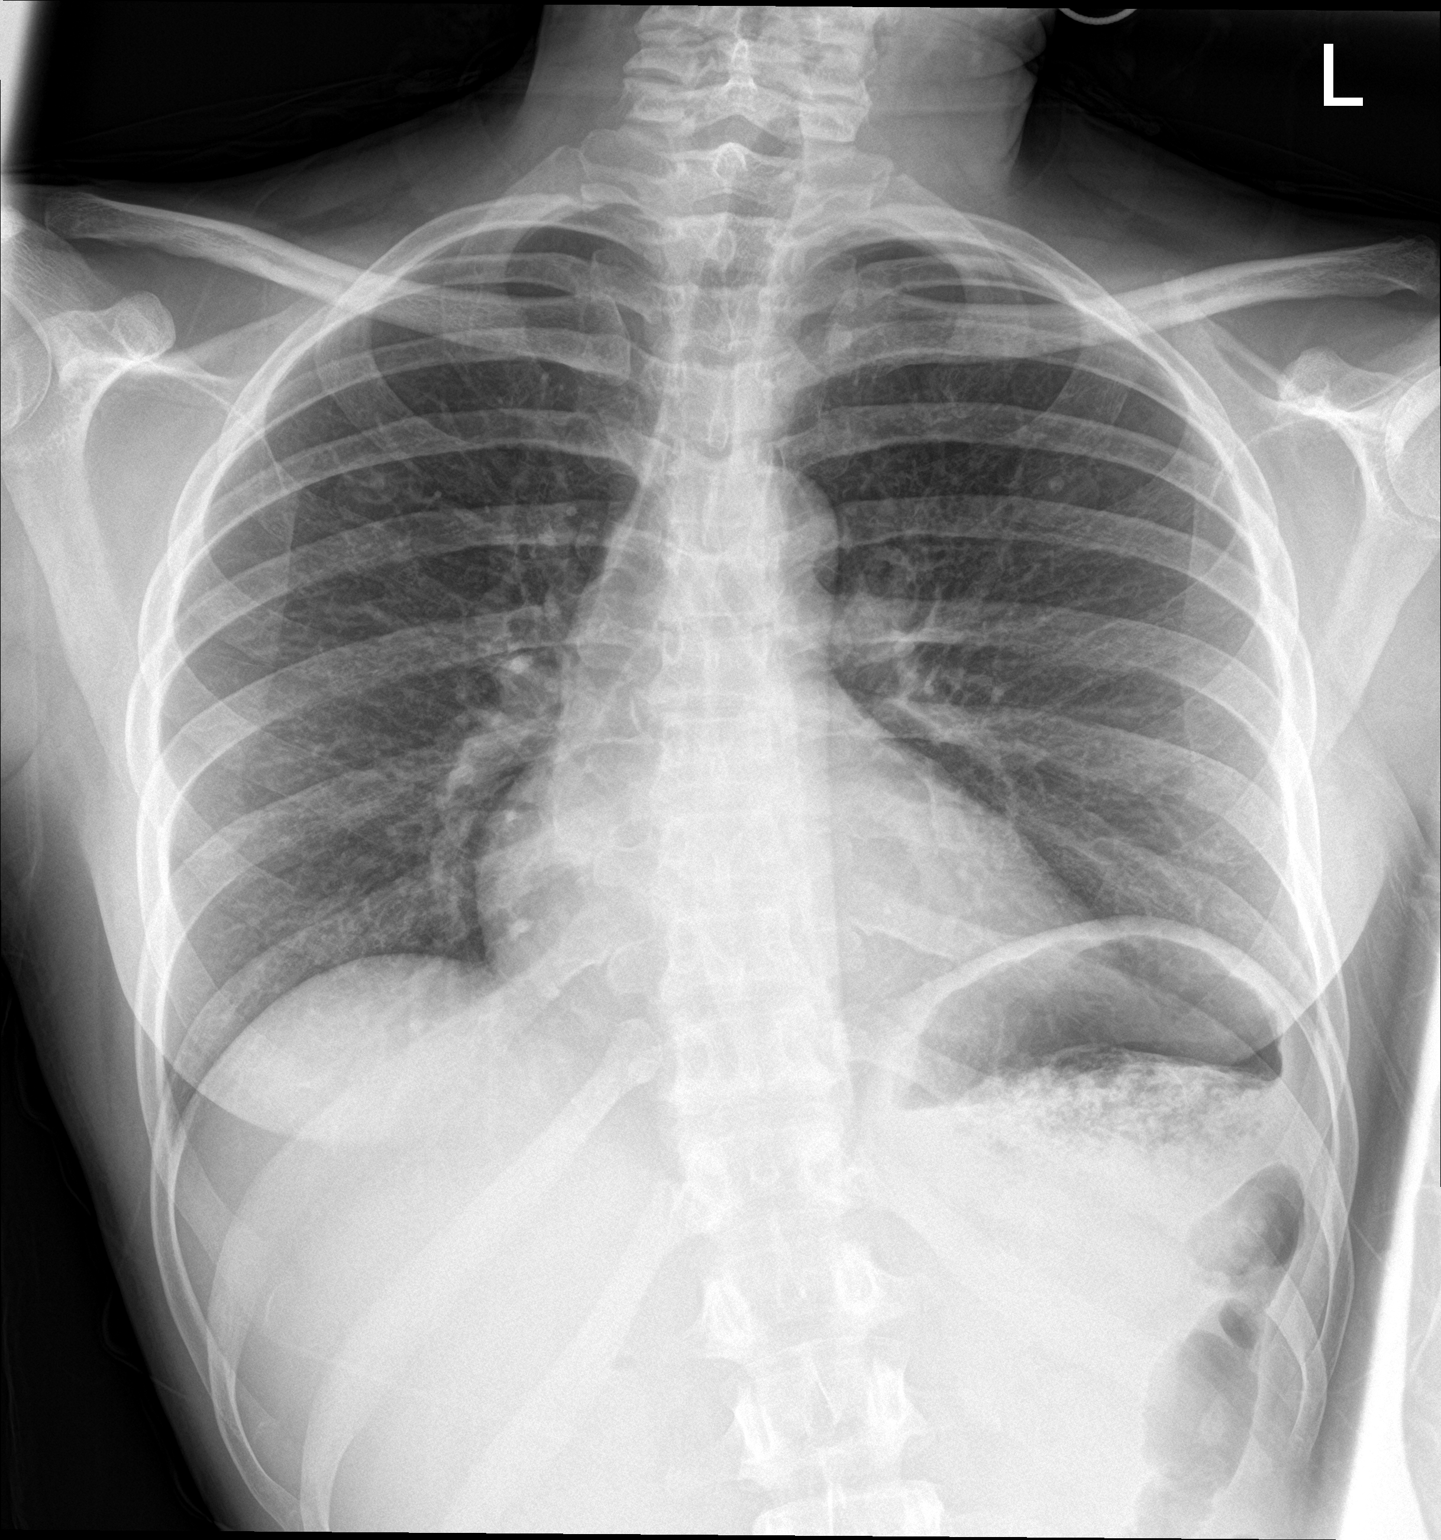

[chest lat]
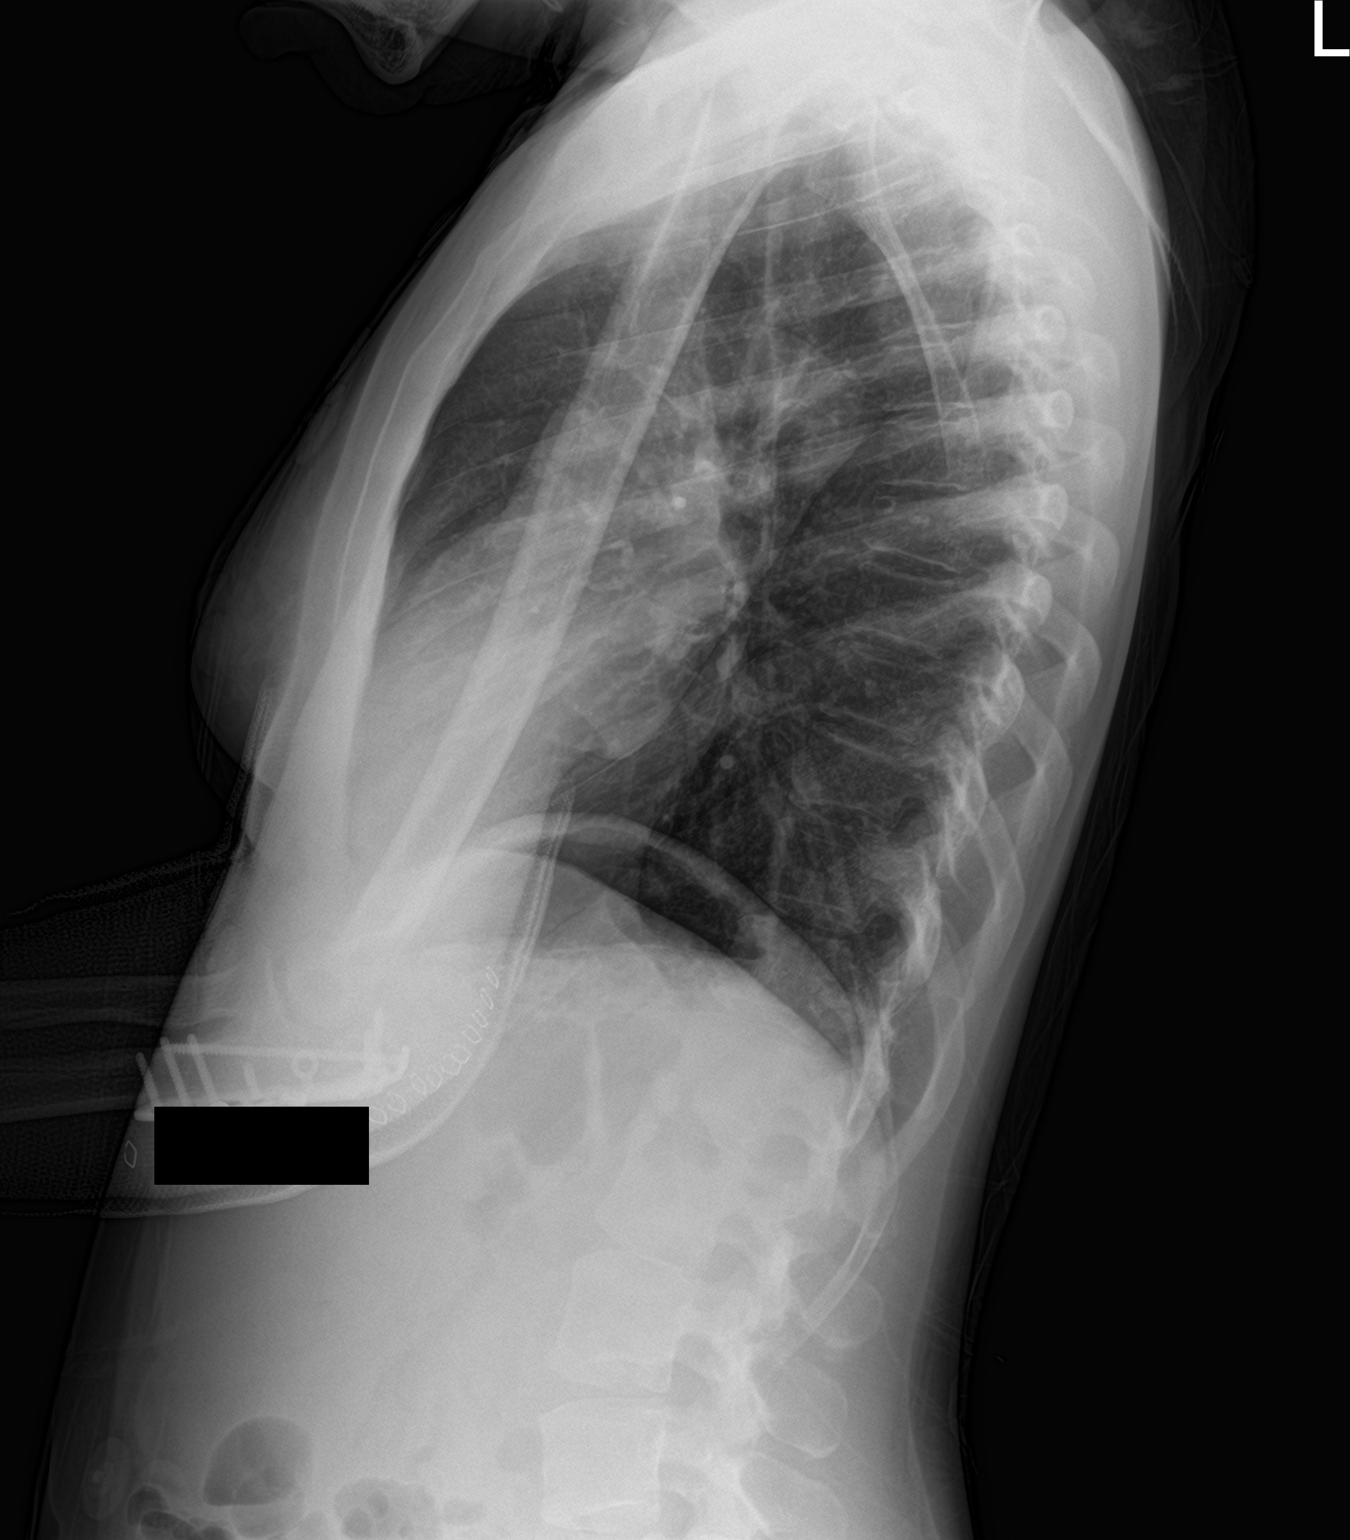

[2 of 2 positions shown; findings below may reference images not displayed]

FINDINGS: The heart size and mediastinal contours are within normal limits.
Both lungs are clear. No visible pleural effusions or pneumothorax.
No acute osseous abnormality.
IMPRESSION: No active cardiopulmonary disease.

## 2021-07-09 ENCOUNTER — Emergency Department (HOSPITAL_COMMUNITY)
Admission: EM | Admit: 2021-07-09 | Discharge: 2021-07-09 | Discharge status: Against Medical Advice | Payer: Medicaid Other

## 2021-07-09 NOTE — ED Notes (Signed)
Pt entered ED and refused to put on a mask which is required to be seen per hospital and department policy. Explained policy to patient several times by multiple different staff members, techs, registration, security and patient refused to wear mask. Pt stated she would be seen elsewhere and left ED.

## 2021-11-18 ENCOUNTER — Ambulatory Visit: Payer: Medicaid Other | Admitting: Podiatry

## 2022-04-15 ENCOUNTER — Ambulatory Visit: Payer: Medicaid Other | Attending: Nurse Practitioner | Admitting: Nurse Practitioner

## 2022-04-15 ENCOUNTER — Encounter: Payer: Self-pay | Admitting: Nurse Practitioner

## 2022-04-15 VITALS — BP 154/97 | HR 87 | Temp 98.5°F | Resp 16 | Wt 114.0 lb

## 2022-04-15 DIAGNOSIS — Z7251 High risk heterosexual behavior: Secondary | ICD-10-CM | POA: Insufficient documentation

## 2022-04-15 DIAGNOSIS — E119 Type 2 diabetes mellitus without complications: Secondary | ICD-10-CM | POA: Diagnosis not present

## 2022-04-15 DIAGNOSIS — E785 Hyperlipidemia, unspecified: Secondary | ICD-10-CM | POA: Diagnosis not present

## 2022-04-15 DIAGNOSIS — Z79899 Other long term (current) drug therapy: Secondary | ICD-10-CM | POA: Insufficient documentation

## 2022-04-15 DIAGNOSIS — Z91148 Patient's other noncompliance with medication regimen for other reason: Secondary | ICD-10-CM | POA: Diagnosis not present

## 2022-04-15 DIAGNOSIS — H9202 Otalgia, left ear: Secondary | ICD-10-CM | POA: Diagnosis not present

## 2022-04-15 DIAGNOSIS — H538 Other visual disturbances: Secondary | ICD-10-CM | POA: Diagnosis not present

## 2022-04-15 DIAGNOSIS — Z113 Encounter for screening for infections with a predominantly sexual mode of transmission: Secondary | ICD-10-CM | POA: Diagnosis not present

## 2022-04-15 DIAGNOSIS — D649 Anemia, unspecified: Secondary | ICD-10-CM | POA: Insufficient documentation

## 2022-04-15 DIAGNOSIS — Z3141 Encounter for fertility testing: Secondary | ICD-10-CM

## 2022-04-15 DIAGNOSIS — Z1231 Encounter for screening mammogram for malignant neoplasm of breast: Secondary | ICD-10-CM

## 2022-04-15 DIAGNOSIS — Z114 Encounter for screening for human immunodeficiency virus [HIV]: Secondary | ICD-10-CM | POA: Diagnosis not present

## 2022-04-15 DIAGNOSIS — I10 Essential (primary) hypertension: Secondary | ICD-10-CM | POA: Insufficient documentation

## 2022-04-15 MED ORDER — PRENATAL + COMPLETE MULTI 0.267 & 373 MG PO THPK: 1.0000 | PACK | Freq: Every day | ORAL | 1 refills | Status: DC

## 2022-04-15 MED ORDER — VALSARTAN 40 MG PO TABS: 40.0000 mg | ORAL_TABLET | Freq: Every day | ORAL | 3 refills | Status: DC

## 2022-04-15 NOTE — Progress Notes (Signed)
F/u HTN Concerns about an adhesion behind left ear  Blurred vision left eye STD checking

## 2022-04-15 NOTE — Progress Notes (Signed)
Assessment & Plan:  [name] was seen today for hypertension, std check, diabetes and blurred vision.  Diagnoses and all orders for this visit:  Essential hypertension -     CMP14+EGFR -     valsartan (DIOVAN) 40 MG tablet; Take 1 tablet (40 mg total) by mouth daily.  Acute ear pain, left -     Ambulatory referral to ENT  Blurred vision, left eye -     Ambulatory referral to Ophthalmology  High risk heterosexual behavior -     Cancel: Cervicovaginal ancillary only -     Cervicovaginal ancillary only; Future  Dyslipidemia, goal LDL below 100 -     Lipid panel  Breast cancer screening by mammogram -     MM DIAG BREAST TOMO BILATERAL; Future  Anemia, unspecified type -     CBC with Differential  Encounter for screening for HIV -     HIV antibody (with reflex)  Encounter for fertility testing -     Ambulatory referral to Gynecology -     Prenat-Methylfol-Chol-Fish Oil (PRENATAL + COMPLETE MULTI) 0.267 & 373 MG THPK; Take 1 tablet by mouth daily at 2 PM.    Patient has been counseled on age-appropriate routine health concerns for screening and prevention. These are reviewed and up-to-date. Referrals have been placed accordingly. Immunizations are up-to-date or declined.    Subjective:   Chief Complaint  Patient presents with   Hypertension   std check   Diabetes   Blurred Vision   HPI Jessica Case 41 y.o. female presents to office today for follow up to HTN.  She has a past medical history of Alcohol withdrawal seizure  (09/24/2020), Arthritis, Claustrophobia, Cold, Fallopian tube abscess, Fibroids, Hypertension, Paranoid schizophrenia, Pneumonia (09/24/2020)  I have not seen Jessica Case in several years. She has been lost to follow up. She states her mother passed last year and the year before that her husband passed.   Requesting referral to eye doctor for blurred vision of left eye. Denies any acute vision changes.   Requesting referral to GYN for PAP  smear and fertility testing. She has one child and wants to have one more.  Desires STD testing. Denies any symptoms of vaginitis.   HTN She is not taking any antihypertensives at this time. Has been out of her medications. Blood pressure is elevated today. Will start her on valsartan 40 mg daily. She does drink alcohol daily. 3-5 forty oz beers daily.  BP Readings from Last 3 Encounters:  04/15/22 (!) 154/97  12/05/20 (!) 162/118  11/01/20 (!) 139/101    Left ear pain Notes left ear pain adjacent to the mastoid bone. States she had stitches placed behind her ear several months ago (I am unable to find this encounter) due to an injury. Stitches were supposed to dissolve however she has significant pain with any pressure applied to the ear or if she tries to sleep on her left side.     Review of Systems  Constitutional:  Negative for fever, malaise/fatigue and weight loss.  HENT:  Positive for ear pain. Negative for congestion, ear discharge, hearing loss, nosebleeds, sinus pain, sore throat and tinnitus.   Eyes:  Positive for blurred vision. Negative for double vision and photophobia.  Respiratory: Negative.  Negative for cough, shortness of breath and stridor.   Cardiovascular: Negative.  Negative for chest pain, palpitations and leg swelling.  Gastrointestinal: Negative.  Negative for heartburn, nausea and vomiting.  Musculoskeletal: Negative.  Negative for myalgias.  Neurological: Negative.  Negative for dizziness, focal weakness, seizures and headaches.  Psychiatric/Behavioral:  Positive for substance abuse. Negative for depression, hallucinations, memory loss and suicidal ideas. The patient is not nervous/anxious and does not have insomnia.     Past Medical History:  Diagnosis Date   Alcohol withdrawal seizure ([place]) 09/24/2020   Arthritis    Claustrophobia    Cold    currently - no meds   Fallopian tube abscess    Fibroids    Hypertension    "some times" -    Paranoid  schizophrenia ([place])    Pneumonia 09/24/2020   SVD (spontaneous vaginal delivery)    x 1    Past Surgical History:  Procedure Laterality Date   BUNIONECTOMY Bilateral    LAPAROSCOPIC UNILATERAL SALPINGECTOMY Left 08/18/2016   Procedure: LAPAROSCOPIC UNILATERAL SALPINGECTOMY;  Surgeon: Lavonia Drafts, MD;  Location: [facility];  Service: Gynecology;  Laterality: Left;   LAPAROSCOPY  08/18/2016   Procedure: LAPAROSCOPY OPERATIVE;  Surgeon: Lavonia Drafts, MD;  Location: [facility];  Service: Gynecology;;   LAPAROTOMY  08/18/2016   Procedure: LAPAROTOMY;  Surgeon: Lavonia Drafts, MD;  Location: [facility];  Service: Gynecology;;  mini laparotomy   MYOMECTOMY  08/18/2016   Procedure: Amado Coe;  Surgeon: Lavonia Drafts, MD;  Location: [facility];  Service: Gynecology;;   ORIF ELBOW FRACTURE Left 10/23/2020   Procedure: OPEN REDUCTION INTERNAL FIXATION (ORIF) ELBOW/OLECRANON FRACTURE;  Surgeon: Iran Planas, MD;  Location: [facility];  Service: Orthopedics;  Laterality: Left;   TOOTH EXTRACTION      Family History  Problem Relation Age of Onset   Hypertension Mother     Social History Reviewed with no changes to be made today.   Outpatient Medications Prior to Visit  Medication Sig Dispense Refill   ibuprofen (ADVIL) 200 MG tablet Take 800 mg by mouth every 6 (six) hours as needed for mild pain. (Patient not taking: Reported on 04/15/2022)     Magnesium 400 MG CAPS Take 400 mg by mouth daily. (Patient not taking: Reported on 04/15/2022) 30 capsule 0   Potassium Chloride ER 20 MEQ TBCR Take 20 mEq by mouth daily for 7 days. 7 tablet 0   No facility-administered medications prior to visit.    Allergies  Allergen Reactions   Citrus Hives and Itching   Food Hives, Itching and Other (See Comments)    Pt confirmed that it is not an anaphylactic reaction   Strawberry Flavor     Hives all over body.    Toradol [Ketorolac Tromethamine] Hives and Itching       Objective:     BP (!) 154/97   Pulse 87   Temp 98.5 F (36.9 C) (Oral)   Resp 16   Wt 114 lb (51.7 kg)   LMP 04/14/2022 (Exact Date)   SpO2 100%   BMI 21.54 kg/m  Wt Readings from Last 3 Encounters:  04/15/22 114 lb (51.7 kg)  11/01/20 115 lb (52.2 kg)  09/24/20 114 lb 10.2 oz (52 kg)    Physical Exam Vitals and nursing note reviewed.  Constitutional:      Appearance: She is well-developed.  HENT:     Head: Normocephalic and atraumatic.     Left Ear: There is mastoid tenderness.  Cardiovascular:     Rate and Rhythm: Normal rate and regular rhythm.     Heart sounds: Normal heart sounds. No murmur heard.    No friction rub. No gallop.  Pulmonary:     Effort: Pulmonary effort is  normal. No tachypnea or respiratory distress.     Breath sounds: Normal breath sounds. No decreased breath sounds, wheezing, rhonchi or rales.  Chest:     Chest wall: No tenderness.  Abdominal:     General: Bowel sounds are normal.     Palpations: Abdomen is soft.  Musculoskeletal:        General: Normal range of motion.     Cervical back: Normal range of motion.  Skin:    General: Skin is warm and dry.  Neurological:     Mental Status: She is alert and oriented to person, place, and time.     Coordination: Coordination normal.  Psychiatric:        Behavior: Behavior normal. Behavior is cooperative.        Thought Content: Thought content normal.        Judgment: Judgment normal.          Patient has been counseled extensively about nutrition and exercise as well as the importance of adherence with medications and regular follow-up. The patient was given clear instructions to go to ER or return to medical center if symptoms don't improve, worsen or new problems develop. The patient verbalized understanding.   Follow-up: Return for BP CHECK WITH LUKE in 3 week/BMP. See me in 3 months.   [name], FNP-BC [facility], [place]   04/15/2022,  1:10 PM

## 2022-04-16 LAB — CMP14+EGFR
ALT: 12 IU/L (ref 0–32)
AST: 29 IU/L (ref 0–40)
Albumin/Globulin Ratio: 1.6 (ref 1.2–2.2)
Albumin: 4.6 g/dL (ref 3.8–4.8)
Alkaline Phosphatase: 95 IU/L (ref 44–121)
BUN/Creatinine Ratio: 8 — ABNORMAL LOW (ref 9–23)
BUN: 6 mg/dL (ref 6–24)
Bilirubin Total: 0.5 mg/dL (ref 0.0–1.2)
CO2: 19 mmol/L — ABNORMAL LOW (ref 20–29)
Calcium: 9.4 mg/dL (ref 8.7–10.2)
Chloride: 103 mmol/L (ref 96–106)
Creatinine, Ser: 0.73 mg/dL (ref 0.57–1.00)
Globulin, Total: 2.8 g/dL (ref 1.5–4.5)
Glucose: 75 mg/dL (ref 70–99)
Potassium: 4.7 mmol/L (ref 3.5–5.2)
Sodium: 141 mmol/L (ref 134–144)
Total Protein: 7.4 g/dL (ref 6.0–8.5)
eGFR: 106 mL/min/{1.73_m2} (ref >59)

## 2022-04-16 LAB — CBC WITH DIFFERENTIAL/PLATELET
Basophils Absolute: 0 10*3/uL (ref 0.0–0.2)
Basos: 0 %
EOS (ABSOLUTE): 0.2 10*3/uL (ref 0.0–0.4)
Eos: 2 %
Hematocrit: 37.1 % (ref 34.0–46.6)
Hemoglobin: 12.5 g/dL (ref 11.1–15.9)
Immature Grans (Abs): 0 10*3/uL (ref 0.0–0.1)
Immature Granulocytes: 0 %
Lymphocytes Absolute: 1.4 10*3/uL (ref 0.7–3.1)
Lymphs: 20 %
MCH: 32.3 pg (ref 26.6–33.0)
MCHC: 33.7 g/dL (ref 31.5–35.7)
MCV: 96 fL (ref 79–97)
Monocytes Absolute: 0.8 10*3/uL (ref 0.1–0.9)
Monocytes: 12 %
Neutrophils Absolute: 4.6 10*3/uL (ref 1.4–7.0)
Neutrophils: 66 %
Platelets: 316 10*3/uL (ref 150–450)
RBC: 3.87 x10E6/uL (ref 3.77–5.28)
RDW: 14.3 % (ref 11.7–15.4)
WBC: 7 10*3/uL (ref 3.4–10.8)

## 2022-04-16 LAB — HIV ANTIBODY (ROUTINE TESTING W REFLEX): HIV Screen 4th Generation wRfx: NONREACTIVE

## 2022-04-16 LAB — LIPID PANEL
Chol/HDL Ratio: 1.9 ratio (ref 0.0–4.4)
Cholesterol, Total: 200 mg/dL — ABNORMAL HIGH (ref 100–199)
HDL: 107 mg/dL (ref >39)
LDL Chol Calc (NIH): 73 mg/dL (ref 0–99)
Triglycerides: 122 mg/dL (ref 0–149)
VLDL Cholesterol Cal: 20 mg/dL (ref 5–40)

## 2022-04-30 ENCOUNTER — Ambulatory Visit
Admission: RE | Admit: 2022-04-30 | Discharge: 2022-04-30 | Disposition: A | Discharge status: Home / Self Care | Payer: Medicaid Other | Source: Ambulatory Visit | Attending: Nurse Practitioner | Admitting: Nurse Practitioner

## 2022-04-30 DIAGNOSIS — Z1231 Encounter for screening mammogram for malignant neoplasm of breast: Secondary | ICD-10-CM

## 2022-05-05 ENCOUNTER — Ambulatory Visit (INDEPENDENT_AMBULATORY_CARE_PROVIDER_SITE_OTHER): Payer: Medicaid Other

## 2022-05-05 ENCOUNTER — Ambulatory Visit (INDEPENDENT_AMBULATORY_CARE_PROVIDER_SITE_OTHER): Payer: Medicaid Other | Admitting: Podiatry

## 2022-05-05 DIAGNOSIS — M779 Enthesopathy, unspecified: Secondary | ICD-10-CM

## 2022-05-05 DIAGNOSIS — M7751 Other enthesopathy of right foot: Secondary | ICD-10-CM

## 2022-05-05 DIAGNOSIS — M7752 Other enthesopathy of left foot: Secondary | ICD-10-CM

## 2022-05-05 DIAGNOSIS — M722 Plantar fascial fibromatosis: Secondary | ICD-10-CM

## 2022-05-05 DIAGNOSIS — M21621 Bunionette of right foot: Secondary | ICD-10-CM

## 2022-05-05 MED ORDER — AMMONIUM LACTATE 12 % EX LOTN: 1.0000 | TOPICAL_LOTION | CUTANEOUS | 0 refills | Status: DC | PRN

## 2022-05-05 MED ORDER — CICLOPIROX 8 % EX SOLN: Freq: Every day | CUTANEOUS | 2 refills | Status: DC

## 2022-05-05 NOTE — Patient Instructions (Addendum)
You can use VOLTAREN GEL on the right foot for pain.   While at your visit today you received a steroid injection in your foot or ankle to help with your pain. Along with having the steroid medication there is some "numbing" medication in the shot that you received. Due to this you may notice some numbness to the area for the next couple of hours.   I would recommend limiting activity for the next few days to help the steroid injection take affect.    The actually benefit from the steroid injection may take up to 2-7 days to see a difference. You may actually experience a small (as in 10%) INCREASE in pain in the first 24 hours---that is common. It would be best if you can ice the area today and take anti-inflammatory medications (such as Ibuprofen, Motrin, or Aleve) if you are able to take these medications. If you were prescribed another medication to help with the pain go ahead and start that medication today    Things to watch out for that you should contact us or a health care provider urgently would include: 1. Unusual (as in more than 10%) increase in pain 2. New fever > 101.5 3. New swelling or redness of the injected area.  4. Streaking of red lines around the area injected.  If you have any questions or concerns about this, please give our office a call at [phone].     Plantar Fasciitis (Heel Spur Syndrome) with Rehab The plantar fascia is a fibrous, ligament-like, soft-tissue structure that spans the bottom of the foot. Plantar fasciitis is a condition that causes pain in the foot due to inflammation of the tissue. SYMPTOMS  Pain and tenderness on the underneath side of the foot. Pain that worsens with standing or walking. CAUSES  Plantar fasciitis is caused by irritation and injury to the plantar fascia on the underneath side of the foot. Common mechanisms of injury include: Direct trauma to bottom of the foot. Damage to a small nerve that runs under the foot where the  main fascia attaches to the heel bone. Stress placed on the plantar fascia due to bone spurs. RISK INCREASES WITH:  Activities that place stress on the plantar fascia (running, jumping, pivoting, or cutting). Poor strength and flexibility. Improperly fitted shoes. Tight calf muscles. Flat feet. Failure to warm-up properly before activity. Obesity. PREVENTION Warm up and stretch properly before activity. Allow for adequate recovery between workouts. Maintain physical fitness: Strength, flexibility, and endurance. Cardiovascular fitness. Maintain a health body weight. Avoid stress on the plantar fascia. Wear properly fitted shoes, including arch supports for individuals who have flat feet.  PROGNOSIS  If treated properly, then the symptoms of plantar fasciitis usually resolve without surgery. However, occasionally surgery is necessary.  RELATED COMPLICATIONS  Recurrent symptoms that may result in a chronic condition. Problems of the lower back that are caused by compensating for the injury, such as limping. Pain or weakness of the foot during push-off following surgery. Chronic inflammation, scarring, and partial or complete fascia tear, occurring more often from repeated injections.  TREATMENT  Treatment initially involves the use of ice and medication to help reduce pain and inflammation. The use of strengthening and stretching exercises may help reduce pain with activity, especially stretches of the Achilles tendon. These exercises may be performed at home or with a therapist. Your caregiver may recommend that you use heel cups of arch supports to help reduce stress on the plantar fascia. Occasionally, corticosteroid injections  are given to reduce inflammation. If symptoms persist for greater than 6 months despite non-surgical (conservative), then surgery may be recommended.   MEDICATION  If pain medication is necessary, then nonsteroidal anti-inflammatory medications, such as  aspirin and ibuprofen, or other minor pain relievers, such as acetaminophen, are often recommended. Do not take pain medication within 7 days before surgery. Prescription pain relievers may be given if deemed necessary by your caregiver. Use only as directed and only as much as you need. Corticosteroid injections may be given by your caregiver. These injections should be reserved for the most serious cases, because they may only be given a certain number of times.  HEAT AND COLD Cold treatment (icing) relieves pain and reduces inflammation. Cold treatment should be applied for 10 to 15 minutes every 2 to 3 hours for inflammation and pain and immediately after any activity that aggravates your symptoms. Use ice packs or massage the area with a piece of ice (ice massage). Heat treatment may be used prior to performing the stretching and strengthening activities prescribed by your caregiver, physical therapist, or athletic trainer. Use a heat pack or soak the injury in warm water.  SEEK IMMEDIATE MEDICAL CARE IF: Treatment seems to offer no benefit, or the condition worsens. Any medications produce adverse side effects.  EXERCISES- RANGE OF MOTION (ROM) AND STRETCHING EXERCISES - Plantar Fasciitis (Heel Spur Syndrome) These exercises may help you when beginning to rehabilitate your injury. Your symptoms may resolve with or without further involvement from your physician, physical therapist or athletic trainer. While completing these exercises, remember:  Restoring tissue flexibility helps normal motion to return to the joints. This allows healthier, less painful movement and activity. An effective stretch should be held for at least 30 seconds. A stretch should never be painful. You should only feel a gentle lengthening or release in the stretched tissue.  RANGE OF MOTION - Toe Extension, Flexion Sit with your right / left leg crossed over your opposite knee. Grasp your toes and gently pull them  back toward the top of your foot. You should feel a stretch on the bottom of your toes and/or foot. Hold this stretch for 10 seconds. Now, gently pull your toes toward the bottom of your foot. You should feel a stretch on the top of your toes and or foot. Hold this stretch for 10 seconds. Repeat  times. Complete this stretch 3 times per day.   RANGE OF MOTION - Ankle Dorsiflexion, Active Assisted Remove shoes and sit on a chair that is preferably not on a carpeted surface. Place right / left foot under knee. Extend your opposite leg for support. Keeping your heel down, slide your right / left foot back toward the chair until you feel a stretch at your ankle or calf. If you do not feel a stretch, slide your bottom forward to the edge of the chair, while still keeping your heel down. Hold this stretch for 10 seconds. Repeat 3 times. Complete this stretch 2 times per day.   STRETCH  Gastroc, Standing Place hands on wall. Extend right / left leg, keeping the front knee somewhat bent. Slightly point your toes inward on your back foot. Keeping your right / left heel on the floor and your knee straight, shift your weight toward the wall, not allowing your back to arch. You should feel a gentle stretch in the right / left calf. Hold this position for 10 seconds. Repeat 3 times. Complete this stretch 2 times per day.  STRETCH  Soleus, Standing Place hands on wall. Extend right / left leg, keeping the other knee somewhat bent. Slightly point your toes inward on your back foot. Keep your right / left heel on the floor, bend your back knee, and slightly shift your weight over the back leg so that you feel a gentle stretch deep in your back calf. Hold this position for 10 seconds. Repeat 3 times. Complete this stretch 2 times per day.  STRETCH  Gastrocsoleus, Standing  Note: This exercise can place a lot of stress on your foot and ankle. Please complete this exercise only if specifically instructed  by your caregiver.  Place the ball of your right / left foot on a step, keeping your other foot firmly on the same step. Hold on to the wall or a rail for balance. Slowly lift your other foot, allowing your body weight to press your heel down over the edge of the step. You should feel a stretch in your right / left calf. Hold this position for 10 seconds. Repeat this exercise with a slight bend in your right / left knee. Repeat 3 times. Complete this stretch 2 times per day.   STRENGTHENING EXERCISES - Plantar Fasciitis (Heel Spur Syndrome)  These exercises may help you when beginning to rehabilitate your injury. They may resolve your symptoms with or without further involvement from your physician, physical therapist or athletic trainer. While completing these exercises, remember:  Muscles can gain both the endurance and the strength needed for everyday activities through controlled exercises. Complete these exercises as instructed by your physician, physical therapist or athletic trainer. Progress the resistance and repetitions only as guided.  STRENGTH - Towel Curls Sit in a chair positioned on a non-carpeted surface. Place your foot on a towel, keeping your heel on the floor. Pull the towel toward your heel by only curling your toes. Keep your heel on the floor. Repeat 3 times. Complete this exercise 2 times per day.  STRENGTH - Ankle Inversion Secure one end of a rubber exercise band/tubing to a fixed object (table, pole). Loop the other end around your foot just before your toes. Place your fists between your knees. This will focus your strengthening at your ankle. Slowly, pull your big toe up and in, making sure the band/tubing is positioned to resist the entire motion. Hold this position for 10 seconds. Have your muscles resist the band/tubing as it slowly pulls your foot back to the starting position. Repeat 3 times. Complete this exercises 2 times per day.  Document Released:  10/05/2005 Document Revised: 12/28/2011 Document Reviewed: 01/17/2009 [facility] Patient Information 2014 [place], [place].

## 2022-05-05 NOTE — Progress Notes (Signed)
Subjective:   Patient ID: Jessica Case, female   DOB: 41 y.o.   MRN: [id number]   HPI Chief Complaint  Patient presents with   Follow-up    BIL  foot pain, corns  callouses, pain in prev surgical site,  pins needles in the bottom of feet   41 year old female presents the office today with a bit of concerns.  Her main concern is bunion of the right foot fifth metatarsal only area tailor's bunion she gets the majority of discomfort.  She also gets calluses.  She said occasionally she will get some discomfort on the previous surgery but this is not her main concern.  Patient will feel pain in needle sensation bottom of her feet.  She states that she is not able to wear shoes with a heel given the pain to the lateral aspect of the foot.  She is also concerned about nail fungus.  No swelling or redness to the toenail sites.   Review of Systems  All other systems reviewed and are negative.  Past Medical History:  Diagnosis Date   Alcohol withdrawal seizure ([place]) 09/24/2020   Arthritis    Claustrophobia    Cold    currently - no meds   Fallopian tube abscess    Fibroids    Hypertension    "some times" -    Paranoid schizophrenia ([place])    Pneumonia 09/24/2020   SVD (spontaneous vaginal delivery)    x 1    Past Surgical History:  Procedure Laterality Date   BUNIONECTOMY Bilateral    LAPAROSCOPIC UNILATERAL SALPINGECTOMY Left 08/18/2016   Procedure: LAPAROSCOPIC UNILATERAL SALPINGECTOMY;  Surgeon: Lavonia Drafts, MD;  Location: [facility];  Service: Gynecology;  Laterality: Left;   LAPAROSCOPY  08/18/2016   Procedure: LAPAROSCOPY OPERATIVE;  Surgeon: Lavonia Drafts, MD;  Location: [facility];  Service: Gynecology;;   LAPAROTOMY  08/18/2016   Procedure: LAPAROTOMY;  Surgeon: Lavonia Drafts, MD;  Location: [facility];  Service: Gynecology;;  mini laparotomy   MYOMECTOMY  08/18/2016   Procedure: Amado Coe;  Surgeon: Lavonia Drafts, MD;  Location: [facility];   Service: Gynecology;;   ORIF ELBOW FRACTURE Left 10/23/2020   Procedure: OPEN REDUCTION INTERNAL FIXATION (ORIF) ELBOW/OLECRANON FRACTURE;  Surgeon: Iran Planas, MD;  Location: [facility];  Service: Orthopedics;  Laterality: Left;   TOOTH EXTRACTION       Current Outpatient Medications:    ammonium lactate (AMLACTIN) 12 % lotion, Apply 1 Application topically as needed for dry skin., Disp: 400 g, Rfl: 0   ciclopirox (PENLAC) 8 % solution, Apply topically at bedtime. Apply over nail and surrounding skin. Apply daily over previous coat. After seven (7) days, may remove with alcohol and continue cycle., Disp: 6.6 mL, Rfl: 2   Prenat-Methylfol-Chol-Fish Oil (PRENATAL + COMPLETE MULTI) 0.267 & 373 MG THPK, Take 1 tablet by mouth daily at 2 PM., Disp: 90 each, Rfl: 1   valsartan (DIOVAN) 40 MG tablet, Take 1 tablet (40 mg total) by mouth daily., Disp: 90 tablet, Rfl: 3  Allergies  Allergen Reactions   Citrus Hives and Itching   Food Hives, Itching and Other (See Comments)    Pt confirmed that it is not an anaphylactic reaction   Strawberry Flavor     Hives all over body.    Toradol [Ketorolac Tromethamine] Hives and Itching          Objective:  Physical Exam  General: AAO x3, NAD  Dermatological: Nails appear to be hypertrophic, dystrophic with yellow, brown discoloration.  No edema, erythema to the toenail sites.  No pain.  Dry skin is present in skin fissures or open sores or any drainage.  Vascular: Dorsalis Pedis artery and Posterior Tibial artery pedal pulses are 2/4 bilateral with immedate capillary fill time. There is no pain with calf compression, swelling, warmth, erythema.   Neruologic: Grossly intact via light touch bilateral.   Musculoskeletal: Tenderness palpation mostly along the right tailors bunion with tenderness lateral 5th MTPJ. Tenderness along plantar aspect of the right 1st MTPJ along distal plantar fascia.  No pain with MPJ range of motion or along the area of the  previous surgery otherwise.  Gait: Unassisted, Nonantalgic.       Assessment:   41 year old female with capsulitis right foot, tendinitis, onychomycosis     Plan:  -Treatment options discussed including all alternatives, risks, and complications -Etiology of symptoms were discussed -X-rays were obtained and reviewed with the patient.  3 views of the left hand right foot were obtained.  Tailor's bunion are present.  Previous bunionectomy of the first metatarsal bilaterally.  No evidence of acute fracture. -Steroid injection to the right fifth MPJ at her request.  Skin was cleaned with Betadine, alcohol and a mixture of 0.5 cc of dexamethasone phosphate, 0.5 cc of Marcaine plain was infiltrated into and around the fifth MPJ without complications on the area of maximal tenderness.  Postinjection care discussed.  Tolerated well. -Offloading pad for the tailor's bunion. -Prescribed AmLactin for the dry skin -Penlac for nail fungus.  Discussed oral, topical as well as alternative treatments. -Discussed stretching, ice exercise as well as wearing shoes and good arch support  Trula Slade DPM   -Dex injection right foot

## 2022-05-22 ENCOUNTER — Ambulatory Visit: Payer: Medicaid Other | Admitting: Pharmacist

## 2022-06-15 ENCOUNTER — Ambulatory Visit: Payer: Medicaid Other | Admitting: Podiatry

## 2022-07-17 ENCOUNTER — Ambulatory Visit: Payer: Medicaid Other | Admitting: Nurse Practitioner

## 2022-08-28 ENCOUNTER — Encounter: Payer: Self-pay | Admitting: Nurse Practitioner

## 2022-08-28 ENCOUNTER — Ambulatory Visit: Payer: Self-pay | Admitting: *Deleted

## 2022-08-28 ENCOUNTER — Other Ambulatory Visit (HOSPITAL_COMMUNITY)
Admission: RE | Admit: 2022-08-28 | Discharge: 2022-08-28 | Disposition: A | Discharge status: Home / Self Care | Payer: Medicaid Other | Source: Ambulatory Visit | Attending: Nurse Practitioner | Admitting: Nurse Practitioner

## 2022-08-28 ENCOUNTER — Ambulatory Visit: Payer: Medicaid Other | Attending: Nurse Practitioner | Admitting: Nurse Practitioner

## 2022-08-28 VITALS — BP 138/97 | HR 86 | Temp 98.1°F | Ht 61.0 in | Wt 114.7 lb

## 2022-08-28 DIAGNOSIS — Z124 Encounter for screening for malignant neoplasm of cervix: Secondary | ICD-10-CM | POA: Insufficient documentation

## 2022-08-28 DIAGNOSIS — I10 Essential (primary) hypertension: Secondary | ICD-10-CM | POA: Diagnosis not present

## 2022-08-28 DIAGNOSIS — F10231 Alcohol dependence with withdrawal delirium: Secondary | ICD-10-CM | POA: Diagnosis not present

## 2022-08-28 DIAGNOSIS — B351 Tinea unguium: Secondary | ICD-10-CM | POA: Insufficient documentation

## 2022-08-28 DIAGNOSIS — Z01419 Encounter for gynecological examination (general) (routine) without abnormal findings: Secondary | ICD-10-CM | POA: Insufficient documentation

## 2022-08-28 DIAGNOSIS — Z91199 Patient's noncompliance with other medical treatment and regimen due to unspecified reason: Secondary | ICD-10-CM | POA: Insufficient documentation

## 2022-08-28 DIAGNOSIS — D696 Thrombocytopenia, unspecified: Secondary | ICD-10-CM | POA: Diagnosis not present

## 2022-08-28 DIAGNOSIS — Z3141 Encounter for fertility testing: Secondary | ICD-10-CM | POA: Diagnosis not present

## 2022-08-28 DIAGNOSIS — D649 Anemia, unspecified: Secondary | ICD-10-CM | POA: Diagnosis not present

## 2022-08-28 DIAGNOSIS — E7800 Pure hypercholesterolemia, unspecified: Secondary | ICD-10-CM | POA: Diagnosis not present

## 2022-08-28 DIAGNOSIS — F200 Paranoid schizophrenia: Secondary | ICD-10-CM | POA: Diagnosis not present

## 2022-08-28 NOTE — Progress Notes (Addendum)
Assessment & Plan:  Jessica Case was seen today for gynecologic exam.  Diagnoses and all orders for this visit:  Encounter for Papanicolaou smear for cervical cancer screening -     Cervicovaginal ancillary only -     Cytology - PAP  Onychomycosis -     Ambulatory referral to Podiatry  Encounter for fertility testing -     Ambulatory referral to Gynecology  Hypercholesterolemia -     Lipid panel  Essential hypertension -     CMP14+EGFR  Anemia, unspecified type -     CBC with Differential  Thrombocytopenia, unspecified (HCC) -     CBC with Differential  Paranoid schizophrenia ([place]) Noncompliant with antipsychotic  Alcohol dependence with withdrawal delirium  Encouraged to stop drinking. She is not ready to quit     Patient has been counseled on age-appropriate routine health concerns for screening and prevention. These are reviewed and up-to-date. Referrals have been placed accordingly. Immunizations are up-to-date or declined.    Subjective:   Chief Complaint  Patient presents with   Gynecologic Exam   HPI Jessica Case 41 y.o. female presents to office today for PAP Smear  She is UTD on mammogram  HTN She is not taking any antihypertensives at this time. Was started on valsartan several weeks ago and not taking as prescribed. Blood pressure is elevated today. She does drink alcohol daily. 3-5 forty oz beers daily.  BP Readings from Last 3 Encounters:  08/28/22 (!) 138/97  04/15/22 (!) 154/97  12/05/20 (!) 162/118    Podiatry Requesting referral to podiatry for painful fissures in the right heel and onychomycosis of multiple toes.   Requesting second opinion for infertility.    Review of Systems  Constitutional: Negative.  Negative for chills, fever, malaise/fatigue and weight loss.  Respiratory: Negative.  Negative for cough, shortness of breath and wheezing.   Cardiovascular: Negative.  Negative for chest pain, orthopnea and leg swelling.   Gastrointestinal:  Negative for abdominal pain.  Genitourinary: Negative.  Negative for flank pain.  Skin: Negative.  Negative for rash.  Psychiatric/Behavioral:  Negative for suicidal ideas.     Past Medical History:  Diagnosis Date   Alcohol withdrawal seizure ([place]) 09/24/2020   Arthritis    Claustrophobia    Cold    currently - no meds   Fallopian tube abscess    Fibroids    Hypertension    "some times" -    Paranoid schizophrenia ([place])    Pneumonia 09/24/2020   SVD (spontaneous vaginal delivery)    x 1    Past Surgical History:  Procedure Laterality Date   BUNIONECTOMY Bilateral    LAPAROSCOPIC UNILATERAL SALPINGECTOMY Left 08/18/2016   Procedure: LAPAROSCOPIC UNILATERAL SALPINGECTOMY;  Surgeon: Lavonia Drafts, MD;  Location: [facility];  Service: Gynecology;  Laterality: Left;   LAPAROSCOPY  08/18/2016   Procedure: LAPAROSCOPY OPERATIVE;  Surgeon: Lavonia Drafts, MD;  Location: [facility];  Service: Gynecology;;   LAPAROTOMY  08/18/2016   Procedure: LAPAROTOMY;  Surgeon: Lavonia Drafts, MD;  Location: [facility];  Service: Gynecology;;  mini laparotomy   MYOMECTOMY  08/18/2016   Procedure: Amado Coe;  Surgeon: Lavonia Drafts, MD;  Location: [facility];  Service: Gynecology;;   ORIF ELBOW FRACTURE Left 10/23/2020   Procedure: OPEN REDUCTION INTERNAL FIXATION (ORIF) ELBOW/OLECRANON FRACTURE;  Surgeon: Iran Planas, MD;  Location: [facility];  Service: Orthopedics;  Laterality: Left;   TOOTH EXTRACTION      Family History  Problem Relation Age of Onset   Hypertension  Mother     Social History Reviewed with no changes to be made today.   Outpatient Medications Prior to Visit  Medication Sig Dispense Refill   ciclopirox (PENLAC) 8 % solution Apply topically at bedtime. Apply over nail and surrounding skin. Apply daily over previous coat. After seven (7) days, may remove with alcohol and continue cycle. 6.6 mL 2   Prenat-Methylfol-Chol-Fish Oil (PRENATAL +  COMPLETE MULTI) 0.267 & 373 MG THPK Take 1 tablet by mouth daily at 2 PM. 90 each 1   valsartan (DIOVAN) 40 MG tablet Take 1 tablet (40 mg total) by mouth daily. 90 tablet 3   ammonium lactate (AMLACTIN) 12 % lotion Apply 1 Application topically as needed for dry skin. (Patient not taking: Reported on 08/28/2022) 400 g 0   No facility-administered medications prior to visit.    Allergies  Allergen Reactions   Citrus Hives and Itching   Food Hives, Itching and Other (See Comments)    Pt confirmed that it is not an anaphylactic reaction   Ibuprofen Itching   Strawberry Flavor     Hives all over body.    Toradol [Ketorolac Tromethamine] Hives and Itching       Objective:    BP (!) 138/97   Pulse 86   Temp 98.1 F (36.7 C) (Temporal)   Ht _0  (1.549 m)   Wt 114 lb 11.2 oz (52 kg)   SpO2 98%   BMI 21.67 kg/m  Wt Readings from Last 3 Encounters:  08/28/22 114 lb 11.2 oz (52 kg)  04/15/22 114 lb (51.7 kg)  11/01/20 115 lb (52.2 kg)    Physical Exam Exam conducted with a chaperone present.  Constitutional:      Appearance: She is well-developed.  HENT:     Head: Normocephalic.  Cardiovascular:     Rate and Rhythm: Normal rate and regular rhythm.     Heart sounds: Normal heart sounds.  Pulmonary:     Effort: Pulmonary effort is normal.     Breath sounds: Normal breath sounds.  Abdominal:     General: Bowel sounds are normal.     Palpations: Abdomen is soft.     Hernia: There is no hernia in the left inguinal area.  Genitourinary:    Exam position: Lithotomy position.     Labia:        Right: No rash, tenderness, lesion or injury.        Left: No rash, tenderness, lesion or injury.      Vagina: Normal. No signs of injury and foreign body. No vaginal discharge, erythema, tenderness or bleeding.     Cervix: Normal.     Uterus: Not deviated and not enlarged.      Adnexa:        Right: No mass, tenderness or fullness.         Left: No mass, tenderness or fullness.        Rectum: Normal. No external hemorrhoid.  Feet:     Right foot:     Skin integrity: Dry skin and fissure present.     Toenail Condition: Fungal disease present.    Left foot:     Skin integrity: Dry skin and fissure present.     Toenail Condition: Fungal disease present. Lymphadenopathy:     Lower Body: No right inguinal adenopathy. No left inguinal adenopathy.  Skin:    General: Skin is warm and dry.  Neurological:     Mental Status: She is alert and oriented  to person, place, and time.  Psychiatric:        Behavior: Behavior normal.        Thought Content: Thought content normal.        Judgment: Judgment normal.          Patient has been counseled extensively about nutrition and exercise as well as the importance of adherence with medications and regular follow-up. The patient was given clear instructions to go to ER or return to medical center if symptoms don't improve, worsen or new problems develop. The patient verbalized understanding.   Follow-up: Return if symptoms worsen or fail to improve.   [name], FNP-BC [facility] and [facility] [place], [place]   08/28/2022, 3:42 PM

## 2022-08-28 NOTE — Telephone Encounter (Signed)
  Chief Complaint: requesting if flu / pneumonia vaccine given today during OV Symptoms: na Frequency: na Pertinent Negatives: Patient denies na Disposition: '[]'$ ED /'[]'$ Urgent Care (no appt availability in office) / '[]'$ Appointment(In office/virtual)/ '[]'$  [facility]/ '[]'$ Home Care/ '[]'$ Refused Recommended Disposition /'[]'$ [place] [facility] [facility]/ '[x]'$  Follow-up with PCP Additional Notes:   No documentation noted that patient received flu or pneumonia vaccinations today . Patient reports she requested vaccinations today but did not think she received them.  Please advise if patient can get vaccinations.     Reason for Disposition  [1] Caller requesting NON-URGENT health information AND [2] PCP's office is the best resource  Answer Assessment - Initial Assessment Questions 1. REASON FOR CALL or QUESTION: "What is your reason for calling today?" or "How can I best help you?" or "What question do you have that I can help answer?"     Patient asking if she received flu/ pnm vaccinations today as requested .  Protocols used: Information Only Call - No Triage-A-AH

## 2022-08-29 LAB — CMP14+EGFR
ALT: 38 IU/L — ABNORMAL HIGH (ref 0–32)
AST: 44 IU/L — ABNORMAL HIGH (ref 0–40)
Albumin/Globulin Ratio: 1.8 (ref 1.2–2.2)
Albumin: 4.8 g/dL (ref 3.9–4.9)
Alkaline Phosphatase: 88 IU/L (ref 44–121)
BUN/Creatinine Ratio: 8 — ABNORMAL LOW (ref 9–23)
BUN: 5 mg/dL — ABNORMAL LOW (ref 6–24)
Bilirubin Total: 0.4 mg/dL (ref 0.0–1.2)
CO2: 25 mmol/L (ref 20–29)
Calcium: 9.6 mg/dL (ref 8.7–10.2)
Chloride: 104 mmol/L (ref 96–106)
Creatinine, Ser: 0.64 mg/dL (ref 0.57–1.00)
Globulin, Total: 2.7 g/dL (ref 1.5–4.5)
Glucose: 80 mg/dL (ref 70–99)
Potassium: 4.4 mmol/L (ref 3.5–5.2)
Sodium: 143 mmol/L (ref 134–144)
Total Protein: 7.5 g/dL (ref 6.0–8.5)
eGFR: 114 mL/min/{1.73_m2} (ref >59)

## 2022-08-29 LAB — CBC WITH DIFFERENTIAL/PLATELET
Basophils Absolute: 0 10*3/uL (ref 0.0–0.2)
Basos: 1 %
EOS (ABSOLUTE): 0.2 10*3/uL (ref 0.0–0.4)
Eos: 2 %
Hematocrit: 37.6 % (ref 34.0–46.6)
Hemoglobin: 12.6 g/dL (ref 11.1–15.9)
Immature Grans (Abs): 0 10*3/uL (ref 0.0–0.1)
Immature Granulocytes: 0 %
Lymphocytes Absolute: 2.8 10*3/uL (ref 0.7–3.1)
Lymphs: 34 %
MCH: 32 pg (ref 26.6–33.0)
MCHC: 33.5 g/dL (ref 31.5–35.7)
MCV: 95 fL (ref 79–97)
Monocytes Absolute: 0.6 10*3/uL (ref 0.1–0.9)
Monocytes: 7 %
Neutrophils Absolute: 4.5 10*3/uL (ref 1.4–7.0)
Neutrophils: 56 %
Platelets: 284 10*3/uL (ref 150–450)
RBC: 3.94 x10E6/uL (ref 3.77–5.28)
RDW: 12.9 % (ref 11.7–15.4)
WBC: 8.1 10*3/uL (ref 3.4–10.8)

## 2022-08-29 LAB — LIPID PANEL
Chol/HDL Ratio: 1.7 ratio (ref 0.0–4.4)
Cholesterol, Total: 243 mg/dL — ABNORMAL HIGH (ref 100–199)
HDL: 141 mg/dL (ref >39)
LDL Chol Calc (NIH): 76 mg/dL (ref 0–99)
Triglycerides: 165 mg/dL — ABNORMAL HIGH (ref 0–149)
VLDL Cholesterol Cal: 26 mg/dL (ref 5–40)

## 2022-08-31 ENCOUNTER — Other Ambulatory Visit: Payer: Self-pay | Admitting: Nurse Practitioner

## 2022-08-31 ENCOUNTER — Telehealth: Payer: Self-pay | Admitting: *Deleted

## 2022-08-31 DIAGNOSIS — A599 Trichomoniasis, unspecified: Secondary | ICD-10-CM

## 2022-08-31 DIAGNOSIS — B9689 Other specified bacterial agents as the cause of diseases classified elsewhere: Secondary | ICD-10-CM

## 2022-08-31 LAB — CERVICOVAGINAL ANCILLARY ONLY
Bacterial Vaginitis (gardnerella): POSITIVE — AB
Candida Glabrata: NEGATIVE
Candida Vaginitis: NEGATIVE
Chlamydia: NEGATIVE
Comment: NEGATIVE
Comment: NEGATIVE
Comment: NEGATIVE
Comment: NEGATIVE
Comment: NEGATIVE
Comment: NORMAL
Neisseria Gonorrhea: NEGATIVE
Trichomonas: POSITIVE — AB

## 2022-08-31 MED ORDER — METRONIDAZOLE 500 MG PO TABS: 500.0000 mg | ORAL_TABLET | Freq: Two times a day (BID) | ORAL | 0 refills | Status: AC

## 2022-08-31 NOTE — Telephone Encounter (Signed)
Call returned and patient will walk in for shots.

## 2022-08-31 NOTE — Telephone Encounter (Signed)
Noted  

## 2022-08-31 NOTE — Telephone Encounter (Signed)
Patient calling for lab results- she can not access her MyChart: Notified: Kidney function normal.  Liver enzymes slightly elevated.  Avoid foods that are high in fat and cholesterol and also avoid drinking alcohol excessively   CBC does not indicate any anemia or bleeding disorders   Cholesterol level slightly elevated.  Diet should be low in fat and cholesterol, avoid beef, unhealthy snacking and alcohol   Pap is still pending

## 2022-09-01 ENCOUNTER — Ambulatory Visit: Payer: Medicaid Other | Attending: Nurse Practitioner

## 2022-09-01 DIAGNOSIS — Z23 Encounter for immunization: Secondary | ICD-10-CM | POA: Diagnosis not present

## 2022-09-01 LAB — CYTOLOGY - PAP
Comment: NEGATIVE
Comment: NEGATIVE
Comment: NEGATIVE
Diagnosis: UNDETERMINED — AB
HPV 16: NEGATIVE
HPV 18 / 45: NEGATIVE
High risk HPV: POSITIVE — AB

## 2022-09-01 NOTE — Progress Notes (Signed)
Patient was here today for a Pneumonia 20 Vaccine and Flu vaccine. Given in the right and left deltoid. Pt tolerated well.

## 2022-09-02 ENCOUNTER — Other Ambulatory Visit: Payer: Self-pay | Admitting: Nurse Practitioner

## 2022-09-02 DIAGNOSIS — R87610 Atypical squamous cells of undetermined significance on cytologic smear of cervix (ASC-US): Secondary | ICD-10-CM

## 2022-09-14 ENCOUNTER — Ambulatory Visit (INDEPENDENT_AMBULATORY_CARE_PROVIDER_SITE_OTHER): Payer: Medicaid Other | Admitting: Podiatry

## 2022-09-14 DIAGNOSIS — M21621 Bunionette of right foot: Secondary | ICD-10-CM

## 2022-09-14 DIAGNOSIS — B351 Tinea unguium: Secondary | ICD-10-CM | POA: Diagnosis not present

## 2022-09-14 NOTE — Progress Notes (Unsigned)
Subjective: Chief Complaint  Patient presents with   Nail Problem    Right foot Nail fungus and cracked dry heel,  patient states that she is not using anything on the heel   41 year old female presents for above concerns.  She states that she still gets dry skin most today to her heel as well as her feet and also for the nails.  She has not been able to get the topical nail fungus medication.  Her main concern today however is on the right foot she points on the fifth metatarsal head along if a tailor's bunion where she gets discomfort.  She tried shoe modifications, offloading, padding without significant improvement she wants to consider other options.  Objective: AAO x3, NAD DP/PT pulses palpable bilaterally, CRT less than 3 seconds Dry skin present bilaterally skin fissure present to the heel without any drainage or pus.  Nails are unchanged remains hypertrophic, dystrophic with brown discoloration.  No edema, erythema.  There is tenderness palpation on the tailor's bunion on the right foot.  No skin breakdown or warmth.  No erythema.  No other areas of discomfort.  MMT 5/5. No pain with calf compression, swelling, warmth, erythema  Assessment: 41 year old female with tailor's bunion right foot; onychomycosis; dry skin  Plan: -All treatment options discussed with the patient including all alternatives, risks, complications.  -And reviewed the x-rays with her in regards to the right foot tailor's bunion.  Discussed surgical versus conservative care.  After discussion she was proceed with surgical intervention.  Discussed failure of bunion repair with excessively versus osteotomy with screw fixation.  Will determine intraoperatively.  She wishes to proceed with this. -The incision placement as well as the postoperative course was discussed with the patient. I discussed risks of the surgery which include, but not limited to, infection, bleeding, pain, swelling, need for further surgery, delayed  or nonhealing, painful or ugly scar, numbness or sensation changes, over/under correction, recurrence, transfer lesions, further deformity, hardware failure, DVT/PE, loss of toe/foot. Patient understands these risks and wishes to proceed with surgery. The surgical consent was reviewed with the patient all 3 pages were signed. No promises or guarantees were given to the outcome of the procedure. All questions were answered to the best of my ability. Before the surgery the patient was encouraged to call the office if there is any further questions. The surgery will be performed at the [facility] on an outpatient basis. -Moisturizer for the skin.  Dispensed miracle foot cream -Formula 7 on the toenails. -Patient encouraged to call the office with any questions, concerns, change in symptoms.   Trula Slade DPM

## 2022-09-14 NOTE — Patient Instructions (Signed)
Pre-Operative Instructions  Congratulations, you have decided to take an important step to improving your quality of life.  You can be assured that the doctors of [facility] will be with you every step of the way.  Plan to be at the surgery center/hospital at least 1 (one) hour prior to your scheduled time unless otherwise directed by the surgical center/hospital staff.  You must have a responsible adult accompany you, remain during the surgery and drive you home.  Make sure you have directions to the surgical center/hospital and know how to get there on time. For hospital based surgery you will need to obtain a history and physical form from your family physician within 1 month prior to the date of surgery- we will give you a form for you primary physician.  We make every effort to accommodate the date you request for surgery.  There are however, times where surgery dates or times have to be moved.  We will contact you as soon as possible if a change in schedule is required.   No Aspirin/Ibuprofen for one week before surgery.  If you are on aspirin, any non-steroidal anti-inflammatory medications (Mobic, Aleve, Ibuprofen) you should stop taking it 7 days prior to your surgery.  You make take Tylenol  For pain prior to surgery.  Medications- If you are taking daily heart and blood pressure medications, seizure, reflux, allergy, asthma, anxiety, pain or diabetes medications, make sure the surgery center/hospital is aware before the day of surgery so they may notify you which medications to take or avoid the day of surgery. No food or drink after midnight the night before surgery unless directed otherwise by surgical center/hospital staff. No alcoholic beverages 24 hours prior to surgery.  No smoking 24 hours prior to or 24 hours after surgery. Wear loose pants or shorts- loose enough to fit over bandages, boots, and casts. No slip on shoes, sneakers are best. Bring your boot with you to the  surgery center/hospital.  Also bring crutches or a walker if your physician has prescribed it for you.  If you do not have this equipment, it will be provided for you after surgery. If you have not been contracted by the surgery center/hospital by the day before your surgery, call to confirm the date and time of your surgery. Leave-time from work may vary depending on the type of surgery you have.  Appropriate arrangements should be made prior to surgery with your employer. Prescriptions will be provided immediately following surgery by your doctor.  Have these filled as soon as possible after surgery and take the medication as directed. Remove nail polish on the operative foot. Wash the night before surgery.  The night before surgery wash the foot and leg well with the antibacterial soap provided and water paying special attention to beneath the toenails and in between the toes.  Rinse thoroughly with water and dry well with a towel.  Perform this wash unless told not to do so by your physician.  Enclosed: 1 Ice pack (please put in freezer the night before surgery)   1 Hibiclens skin cleaner   Pre-op Instructions  If you have any questions regarding the instructions, do not hesitate to call our office at any point during this process.   [place]: [address] [place], [place] [address] [phone]  [place]: [address] [place], [place] [address] [phone]  Dr. Daimion Adamcik, DPM  

## 2022-09-15 ENCOUNTER — Telehealth: Payer: Self-pay | Admitting: Podiatry

## 2022-09-15 NOTE — Telephone Encounter (Signed)
DOS: 10/07/2022  Medicaid [place] Access  Tailors Bunionectomy [address]t [phone])  DX: M20.11  Patients insurance does not require prior authorization for surgery.

## 2022-10-05 ENCOUNTER — Telehealth: Payer: Self-pay | Admitting: Podiatry

## 2022-10-05 NOTE — Telephone Encounter (Signed)
Called patient back to see how I could assist her regarding her surgery scheduled for Wednesday. Patient stated she wanted to know what time her surgery was and I told her she would get a call tomorrow from the surgical center letting her know that information. I also explained that to her husband and told them to call with any other questions.

## 2022-10-05 NOTE — Telephone Encounter (Signed)
I'd appreciate it if you gave me a call today due to the fact that I want to get this surgery done.

## 2022-10-07 ENCOUNTER — Other Ambulatory Visit: Payer: Self-pay | Admitting: Podiatry

## 2022-10-07 DIAGNOSIS — M2011 Hallux valgus (acquired), right foot: Secondary | ICD-10-CM

## 2022-10-07 MED ORDER — HYDROCODONE-ACETAMINOPHEN 5-325 MG PO TABS: 1.0000 | ORAL_TABLET | Freq: Four times a day (QID) | ORAL | 0 refills | Status: DC | PRN

## 2022-10-07 MED ORDER — CEPHALEXIN 500 MG PO CAPS: 500.0000 mg | ORAL_CAPSULE | Freq: Three times a day (TID) | ORAL | 0 refills | Status: DC

## 2022-10-07 MED ORDER — PROMETHAZINE HCL 25 MG PO TABS: 25.0000 mg | ORAL_TABLET | Freq: Three times a day (TID) | ORAL | 0 refills | Status: DC | PRN

## 2022-10-07 NOTE — Progress Notes (Signed)
Postop medications sent 

## 2022-10-13 ENCOUNTER — Ambulatory Visit (INDEPENDENT_AMBULATORY_CARE_PROVIDER_SITE_OTHER): Payer: Medicaid Other

## 2022-10-13 ENCOUNTER — Ambulatory Visit (INDEPENDENT_AMBULATORY_CARE_PROVIDER_SITE_OTHER): Payer: Medicaid Other | Admitting: *Deleted

## 2022-10-13 ENCOUNTER — Telehealth: Payer: Self-pay | Admitting: *Deleted

## 2022-10-13 DIAGNOSIS — M21621 Bunionette of right foot: Secondary | ICD-10-CM

## 2022-10-13 DIAGNOSIS — Z9889 Other specified postprocedural states: Secondary | ICD-10-CM

## 2022-10-13 NOTE — Telephone Encounter (Signed)
Patient requesting refill on pain medication. Asking if she could get as soon as possible, as she has her dad driving her and he will not drive after dark.

## 2022-10-13 NOTE — Progress Notes (Signed)
Patient presents today for post op visit # 1, patient of [facility].    POV #1 DOS 10/07/2022 RT FOOT SURGICAL CORRECTION OF TAILORS BUNION   She presents in her walking boot. Denies any falls or injury to the foot. Foot is swollen. No signs of infection. No calf pain or shortness of breath. Bandages dry and intact. Incision is intact. She is taking her pain medication and antibiotic regularly, as instructed.  She is complaining of a lot of pain. Requesting refill of pain medication.   BP: 138/105 P: 74   Xrays taken today.   Foot redressed today and placed her back in the boot. Reviewed icing and elevation. She will follow up with Dr. Jacqualyn Posey next week for POV# 2.   ~Sent refill request to provider on call~

## 2022-10-14 ENCOUNTER — Other Ambulatory Visit: Payer: Self-pay | Admitting: Podiatry

## 2022-10-14 ENCOUNTER — Telehealth: Payer: Self-pay

## 2022-10-14 MED ORDER — HYDROCODONE-ACETAMINOPHEN 5-325 MG PO TABS: 1.0000 | ORAL_TABLET | Freq: Four times a day (QID) | ORAL | 0 refills | Status: DC | PRN

## 2022-10-14 NOTE — Telephone Encounter (Signed)
Looks like Dr. Jacqualyn Posey sent a refill today.  Thanks, Dr. Amalia Hailey

## 2022-10-22 ENCOUNTER — Ambulatory Visit (INDEPENDENT_AMBULATORY_CARE_PROVIDER_SITE_OTHER): Payer: Medicaid Other | Admitting: Podiatry

## 2022-10-22 DIAGNOSIS — Z9889 Other specified postprocedural states: Secondary | ICD-10-CM

## 2022-10-22 NOTE — Progress Notes (Signed)
Patient presents today for post op visit # 2, patient of [facility].   POV #2 DOS 10/07/2022 Right Foot Surgical Correction of Tailors Bunion    Patient presents today ambulating with cam boot on. Denies any falls or injury to the foot. No signs of infection. No calf pain or shortness of breath. Bandages dry and intact. Sutures is intact. Incision site was assessed by Dr. Jacqualyn Posey and 2 sutures were removed today per providers orders.    Foot redressed with gauze wrap and coban wrap today and placed him back in the boot. Reviewed icing and elevation. She will follow up with Dr. Jacqualyn Posey in 1 week for POV# 3.

## 2022-10-26 NOTE — Telephone Encounter (Signed)
Done

## 2022-11-05 ENCOUNTER — Ambulatory Visit (INDEPENDENT_AMBULATORY_CARE_PROVIDER_SITE_OTHER): Payer: Medicaid Other | Admitting: Podiatry

## 2022-11-05 ENCOUNTER — Ambulatory Visit: Payer: Medicaid Other

## 2022-11-05 DIAGNOSIS — M21621 Bunionette of right foot: Secondary | ICD-10-CM

## 2022-11-05 DIAGNOSIS — Z9889 Other specified postprocedural states: Secondary | ICD-10-CM

## 2022-11-08 NOTE — Progress Notes (Signed)
Subjective: Chief Complaint  Patient presents with   Post-op Follow-up    POV #3 DOS 10/07/2022 RT FOOT SURGICAL CORRECTION OF TAILORS BUNION     42 year old female presents for above concerns.  She presents today for suture movable.  States that she has been doing well.  She is wearing a regular foot flap today.  No fevers or chills.  No significant pain and she feels it is continue to improve.    Objective: AAO x3, NAD DP/PT pulses palpable bilaterally, CRT less than 3 seconds Status post tailor's bunionectomy on the right foot.  Incisions coapted with sutures intact.  There is some mild edema there is no erythema or warmth or signs of infection.  No significant pain on exam. No pain with calf compression, swelling, warmth, erythema  Assessment: 42 year old female with tailor's bunion right foot; onychomycosis; dry skin  Plan: -All treatment options discussed with the patient including all alternatives, risks, complications.  -X-rays were obtained and reviewed.  3 views were obtained.  Status post ostectomy fifth metatarsal. -I removed the remaining sutures without any complications incisions well coapted.  Steri-Strips applied for reinforcement followed by antibiotic ointment and a dressing.  Continue daily dressing changes for now.  Return to regular shoe as tolerated gradual increase activity level.  Continue ice, elevate as well as compression Of the residual edema.  Monitor any signs or symptoms of infection. -As she is doing well we will see her back on an as-needed basis and she agrees this plan has no further questions.  However should she need anything she is well aware to let me know.  Trula Slade DPM

## 2022-11-09 ENCOUNTER — Encounter: Payer: Self-pay | Admitting: Obstetrics and Gynecology

## 2022-11-09 ENCOUNTER — Other Ambulatory Visit (HOSPITAL_COMMUNITY)
Admission: RE | Admit: 2022-11-09 | Discharge: 2022-11-09 | Disposition: A | Discharge status: Home / Self Care | Payer: Medicaid Other | Source: Ambulatory Visit | Attending: Obstetrics and Gynecology | Admitting: Obstetrics and Gynecology

## 2022-11-09 ENCOUNTER — Ambulatory Visit (INDEPENDENT_AMBULATORY_CARE_PROVIDER_SITE_OTHER): Payer: Medicaid Other | Admitting: Obstetrics and Gynecology

## 2022-11-09 VITALS — Ht 61.0 in | Wt 127.0 lb

## 2022-11-09 DIAGNOSIS — R87810 Cervical high risk human papillomavirus (HPV) DNA test positive: Secondary | ICD-10-CM | POA: Insufficient documentation

## 2022-11-09 DIAGNOSIS — Z01812 Encounter for preprocedural laboratory examination: Secondary | ICD-10-CM

## 2022-11-09 DIAGNOSIS — R87610 Atypical squamous cells of undetermined significance on cytologic smear of cervix (ASC-US): Secondary | ICD-10-CM

## 2022-11-09 LAB — POCT URINE PREGNANCY: Preg Test, Ur: NEGATIVE

## 2022-11-09 NOTE — Patient Instructions (Signed)
Colposcopy, Care After  The following information offers guidance on how to care for yourself after your procedure. Your doctor may also give you more specific instructions. If you have problems or questions, contact your doctor. What can I expect after the procedure? If you did not have a sample of your tissue taken out (did not have a biopsy), you may only have some spotting of blood for a few days. You can go back to your normal activities. If you had a sample of your tissue taken out, it is common to have: Soreness and mild pain. These may last for a few days. Mild bleeding or fluid (discharge) coming from your vagina. The fluid will look dark and grainy. You may have this for a few days. The fluid may be caused by a liquid that was used during your procedure. You may need to wear a sanitary pad. Spotting of blood for at least 48 hours after the procedure. Follow these instructions at home: Medicines Take over-the-counter and prescription medicines only as told by your doctor. Ask your doctor what over-the-counter pain medicines and prescription medicines you can start taking again. This is very important if you take blood thinners. Activity For at least 3 days, or for as long as told by your doctor, avoid: Douching. Using tampons. Having sex. Return to your normal activities as told by your doctor. Ask your doctor what activities are safe for you. General instructions Ask your doctor if you may take baths, swim, or use a hot tub. You may take showers. If you use birth control (contraception), keep using it. Keep all follow-up visits. Contact a doctor if: You have a fever or chills. You faint or feel light-headed. Get help right away if: You bleed a lot from your vagina. A lot of bleeding means that the bleeding soaks through a pad in less than 1 hour. You have clumps of blood (blood clots) coming from your vagina. You have signs that could mean you have an infection. This may be  fluid coming from your vagina that is: Different than normal. Yellow. Bad-smelling. You have very bad pain or cramps in your lower belly that do not get better with medicine. Summary If you did not have a sample of your tissue taken out, you may only have some spotting of blood for a few days. You can go back to your normal activities. If you had a sample of your tissue taken out, it is common to have mild pain for a few days and spotting for 48 hours. Avoid douching, using tampons, and having sex for at least 3 days after the procedure or for as long as told. Get help right away if you have a lot of bleeding, very bad pain, or signs of infection. This information is not intended to replace advice given to you by your health care provider. Make sure you discuss any questions you have with your health care provider. Document Revised: 03/02/2021 Document Reviewed: 03/02/2021 Elsevier Patient Education  2023 Elsevier Inc.  

## 2022-11-09 NOTE — Progress Notes (Signed)
    GYNECOLOGY CLINIC COLPOSCOPY PROCEDURE NOTE  42 y.o. G2P0110 here for colposcopy for ASCUS with POSITIVE high risk HPV pap smear on 12/23. Discussed role for HPV in cervical dysplasia, need for surveillance.  Patient given informed consent, signed copy in the chart, time out was performed.  Placed in lithotomy position. Cervix viewed with speculum and colposcope after application of acetic acid.   Colposcopy adequate? Yes  acetowhite lesion(s) noted at 12 & 6 o'clock; corresponding biopsies obtained.  ECC specimen obtained. Monsel's applied All specimens were labelled and sent to pathology.  Patient was given post procedure instructions.  Will follow up pathology and manage accordingly.  Routine preventative health maintenance measures emphasized.    Arlina Robes, MD, [place] Attending [place] for [place]

## 2022-11-09 NOTE — Progress Notes (Signed)
Referral from [facility] for Colpo: ASCUS +HPV

## 2022-11-11 LAB — SURGICAL PATHOLOGY

## 2023-02-02 ENCOUNTER — Other Ambulatory Visit: Payer: Self-pay | Admitting: Nurse Practitioner

## 2023-02-02 ENCOUNTER — Ambulatory Visit: Payer: Self-pay | Admitting: *Deleted

## 2023-02-02 DIAGNOSIS — I10 Essential (primary) hypertension: Secondary | ICD-10-CM

## 2023-02-02 NOTE — Telephone Encounter (Signed)
sinus discomfort / rx req   The patient has experienced sinus discomfort for roughly a week  The patient would like to be prescribed something for their sinus drainage, sinus congestion and watery eyes  Please contact the patient further when possible      Chief Complaint: Sinus Pain Symptoms: Sinus drainage, pressure, earache both ears. Drainage greenish Frequency: 1 week Pertinent Negatives: Patient denies fever, SOB Disposition: ED /[] Urgent Care (no appt availability in office) / Appointment(In office/virtual)/  [facility]/ Home Care/ Refused Recommended Disposition /[] [facility]/  Follow-up with PCP Additional Notes: Appt secured for tomorrow. Care advise provided, pt verbalizes understanding. Reason for Disposition  Earache  Answer Assessment - Initial Assessment Questions 1. LOCATION: "Where does it hurt?"      Eyes, cheekbones, ears 2. ONSET: "When did the sinus pain start?"  (e.g., hours, days)      1 week ago 3. SEVERITY: "How bad is the pain?"   (Scale 1-10; mild, moderate or severe)   - MILD (1-3): doesn't interfere with normal activities    - MODERATE (4-7): interferes with normal activities (e.g., work or school) or awakens from sleep   - SEVERE (8-10): excruciating pain and patient unable to do any normal activities         7-8/10 4. RECURRENT SYMPTOM: "Have you ever had sinus problems before?" If Yes, ask: "When was the last time?" and "What happened that time?"      Yes 5. NASAL CONGESTION: "Is the nose blocked?" If Yes, ask: "Can you open it or must you breathe through your mouth?"     Stuffy nose 6. NASAL DISCHARGE: "Do you have discharge from your nose?" If so ask, "What color?"     Green 7. FEVER: "Do you have a fever?" If Yes, ask: "What is it, how was it measured, and when did it start?"      No 8. OTHER SYMPTOMS: "Do you have any other symptoms?" (e.g., sore throat, cough, earache, difficulty breathing)      Earache,cough.  Hoarse voice.   throat sore, no longer  Protocols used: Sinus Pain or Congestion-A-AH

## 2023-02-02 NOTE — Telephone Encounter (Unsigned)
Copied from CRM [id number]. Topic: General - Other >> Feb 02, 2023 10:55 AM Everette C wrote: Reason for CRM: Medication Refill - Medication: valsartan (DIOVAN) 40 MG tablet [[id number]]  loratadine (CLARITIN) 10 MG tablet [[id number]]  Has the patient contacted their pharmacy? Yes.   (Agent: If no, request that the patient contact the pharmacy for the refill. If patient does not wish to contact the pharmacy document the reason why and proceed with request.) (Agent: If yes, when and what did the pharmacy advise?)  Preferred Pharmacy (with phone number or street name): [facility] [phone] - [place], [place] - [address] AT [facility] [address] [place] [address] Phone: [phone] Fax: [phone] Hours: Not open 24 hours   Has the patient been seen for an appointment in the last year OR does the patient have an upcoming appointment? Yes.    Agent: Please be advised that RX refills may take up to 3 business days. We ask that you follow-up with your pharmacy.

## 2023-02-03 ENCOUNTER — Ambulatory Visit: Payer: Medicaid Other | Attending: Nurse Practitioner | Admitting: Nurse Practitioner

## 2023-02-03 ENCOUNTER — Encounter: Payer: Self-pay | Admitting: Nurse Practitioner

## 2023-02-03 VITALS — BP 189/129 | HR 100 | Ht 61.0 in | Wt 114.8 lb

## 2023-02-03 DIAGNOSIS — J301 Allergic rhinitis due to pollen: Secondary | ICD-10-CM | POA: Diagnosis not present

## 2023-02-03 DIAGNOSIS — I10 Essential (primary) hypertension: Secondary | ICD-10-CM | POA: Diagnosis not present

## 2023-02-03 DIAGNOSIS — Z91148 Patient's other noncompliance with medication regimen for other reason: Secondary | ICD-10-CM | POA: Diagnosis not present

## 2023-02-03 DIAGNOSIS — Z79899 Other long term (current) drug therapy: Secondary | ICD-10-CM | POA: Insufficient documentation

## 2023-02-03 DIAGNOSIS — F1010 Alcohol abuse, uncomplicated: Secondary | ICD-10-CM | POA: Insufficient documentation

## 2023-02-03 DIAGNOSIS — R4781 Slurred speech: Secondary | ICD-10-CM | POA: Diagnosis not present

## 2023-02-03 MED ORDER — PREDNISONE 20 MG PO TABS: 20.0000 mg | ORAL_TABLET | Freq: Every day | ORAL | 0 refills | Status: DC

## 2023-02-03 MED ORDER — VALSARTAN 40 MG PO TABS: 40.0000 mg | ORAL_TABLET | Freq: Every day | ORAL | 3 refills | Status: DC

## 2023-02-03 MED ORDER — LORATADINE 10 MG PO TABS: 10.0000 mg | ORAL_TABLET | Freq: Every day | ORAL | 3 refills | Status: DC

## 2023-02-03 MED ORDER — FLUTICASONE PROPIONATE 50 MCG/ACT NA SUSP: 2.0000 | Freq: Every day | NASAL | 0 refills | Status: DC

## 2023-02-03 NOTE — Telephone Encounter (Signed)
Unable to refill per protocol, Claritin Rx expired. Discontinued 10/02/20. Rx for valsartan is too soon for refill. Last refill 04/15/22 for 90 and 3 refills.  Requested Prescriptions  Pending Prescriptions Disp Refills   valsartan (DIOVAN) 40 MG tablet 90 tablet 3    Sig: Take 1 tablet (40 mg total) by mouth daily.     Cardiovascular:  Angiotensin Receptor Blockers Failed - 02/02/2023 11:48 AM      Failed - Last BP in normal range    BP Readings from Last 1 Encounters:  08/28/22 (!) 138/97         Passed - Cr in normal range and within 180 days    Creatinine, Ser  Date Value Ref Range Status  08/28/2022 0.64 0.57 - 1.00 mg/dL Final         Passed - K in normal range and within 180 days    Potassium  Date Value Ref Range Status  08/28/2022 4.4 3.5 - 5.2 mmol/L Final         Passed - Patient is not pregnant      Passed - Valid encounter within last 6 months    Recent Outpatient Visits           5 months ago Encounter for Papanicolaou smear for cervical cancer screening   [place] [facility] [place], Shea Stakes, NP   9 months ago Essential hypertension   [place] [facility] [place], Shea Stakes, NP   2 years ago Abnormal urine odor   [place] [facility] [place], Marzella Schlein, [place]   2 years ago No-show for appointment   [facility] [place], Virginia J, NP   4 years ago Abdominal pain, unspecified abdominal location   [facility] [place], [place] W, NP               loratadine (CLARITIN) 10 MG tablet 30 tablet 11    Sig: Take 1 tablet (10 mg total) by mouth daily.     Ear, Nose, and Throat:  Antihistamines 2 Passed - 02/02/2023 11:48 AM      Passed - Cr in normal range and within 360 days    Creatinine, Ser  Date Value Ref Range Status  08/28/2022 0.64 0.57 - 1.00 mg/dL Final         Passed - Valid encounter within last 12 months     Recent Outpatient Visits           5 months ago Encounter for Papanicolaou smear for cervical cancer screening   [facility] [place], Shea Stakes, NP   9 months ago Essential hypertension   [place] [facility] [place], Shea Stakes, NP   2 years ago Abnormal urine odor   [place] [facility] [place], Marzella Schlein, [place]   2 years ago No-show for appointment   [facility] [place], [place] J, NP   4 years ago Abdominal pain, unspecified abdominal location   [facility] Claiborne Rigg, NP

## 2023-02-03 NOTE — Progress Notes (Signed)
Patient is experiencing sore throat, congestion and sinus pain. Started two weeks ago.

## 2023-02-06 ENCOUNTER — Encounter: Payer: Self-pay | Admitting: Nurse Practitioner

## 2023-02-06 NOTE — Progress Notes (Signed)
Assessment & Plan:  Jessica Case was seen today for sinus problem.  Diagnoses and all orders for this visit:  Seasonal allergic rhinitis due to pollen -     loratadine (CLARITIN) 10 MG tablet; Take 1 tablet (10 mg total) by mouth daily. -     fluticasone (FLONASE) 50 MCG/ACT nasal spray; Place 2 sprays into both nostrils daily. -     predniSONE (DELTASONE) 20 MG tablet; Take 1 tablet (20 mg total) by mouth daily with breakfast.  Essential hypertension Nonadherent  -     valsartan (DIOVAN) 40 MG tablet; Take 1 tablet (40 mg total) by mouth daily. Continue all antihypertensives as prescribed.  Reminded to bring in blood pressure log for follow  up appointment.  RECOMMENDATIONS: DASH/Mediterranean Diets are healthier choices for HTN.      Patient has been counseled on age-appropriate routine health concerns for screening and prevention. These are reviewed and up-to-date. Referrals have been placed accordingly. Immunizations are up-to-date or declined.    Subjective:   Chief Complaint  Patient presents with   Sinus Problem   Sinus Problem Associated symptoms include congestion. Pertinent negatives include no coughing, headaches or shortness of breath.   Jessica Case 42 y.o. female presents to office today with complaints of sinus symptoms and for follow up to HTN  Allergic Rhinitis: Jessica Case is here for evaluation of possible allergic rhinitis. Patient's symptoms include clear rhinorrhea, itchy nose, nasal congestion, postnasal drip, pressure sensation in ears, sinus pressure, sneezing, and watery eyes. These symptoms are seasonal. Current triggers include exposure to pollens. The patient has been suffering from these symptoms for approximately 2 weeks. The patient has tried  nothing .   HTN History of ETOH abuse. Non adherent with medications. Continues to drink alcohol. She is under the influence of some substance or alcohol today. Can barely keep eyes open, slurring  roads, won't sit still.  BP Readings from Last 3 Encounters:  02/03/23 (!) 189/129  08/28/22 (!) 138/97  04/15/22 (!) 154/97     Review of Systems  Constitutional:  Negative for fever, malaise/fatigue and weight loss.  HENT:  Positive for congestion and ear discharge. Negative for nosebleeds.   Eyes:  Positive for discharge. Negative for blurred vision, double vision, photophobia, pain and redness.  Respiratory: Negative.  Negative for cough and shortness of breath.   Cardiovascular: Negative.  Negative for chest pain, palpitations and leg swelling.  Gastrointestinal: Negative.  Negative for heartburn, nausea and vomiting.  Musculoskeletal: Negative.  Negative for myalgias.  Neurological: Negative.  Negative for dizziness, focal weakness, seizures and headaches.  Endo/Heme/Allergies:  Positive for environmental allergies.  Psychiatric/Behavioral:  Negative for suicidal ideas.     Past Medical History:  Diagnosis Date   Alcohol withdrawal seizure 09/24/2020   Arthritis    Claustrophobia    Cold    currently - no meds   Fallopian tube abscess    Fibroids    Hypertension    "some times" -    Paranoid schizophrenia    Pneumonia 09/24/2020   SVD (spontaneous vaginal delivery)    x 1    Past Surgical History:  Procedure Laterality Date   BUNIONECTOMY Bilateral    LAPAROSCOPIC UNILATERAL SALPINGECTOMY Left 08/18/2016   Procedure: LAPAROSCOPIC UNILATERAL SALPINGECTOMY;  Surgeon: Willodean Rosenthal, MD;  Location: [facility];  Service: Gynecology;  Laterality: Left;   LAPAROSCOPY  08/18/2016   Procedure: LAPAROSCOPY OPERATIVE;  Surgeon: Willodean Rosenthal, MD;  Location: [facility];  Service: Gynecology;;   LAPAROTOMY  08/18/2016   Procedure: LAPAROTOMY;  Surgeon: Willodean Rosenthal, MD;  Location: [facility];  Service: Gynecology;;  mini laparotomy   MYOMECTOMY  08/18/2016   Procedure: Claria Dice;  Surgeon: Willodean Rosenthal, MD;  Location: [facility];  Service: Gynecology;;    ORIF ELBOW FRACTURE Left 10/23/2020   Procedure: OPEN REDUCTION INTERNAL FIXATION (ORIF) ELBOW/OLECRANON FRACTURE;  Surgeon: Bradly Bienenstock, MD;  Location[facility];  Service: Orthopedics;  Laterality: Left;   TOOTH EXTRACTION      Family History  Problem Relation Age of Onset   Hypertension Mother     Social History Reviewed with no changes to be made today.   Outpatient Medications Prior to Visit  Medication Sig Dispense Refill   Prenat-Methylfol-Chol-Fish Oil (PRENATAL + COMPLETE MULTI) 0.267 & 373 MG THPK Take 1 tablet by mouth daily at 2 PM. 90 each 1   promethazine (PHENERGAN) 25 MG tablet Take 1 tablet (25 mg total) by mouth every 8 (eight) hours as needed for nausea or vomiting. 20 tablet 0   valsartan (DIOVAN) 40 MG tablet Take 1 tablet (40 mg total) by mouth daily. 90 tablet 3   No facility-administered medications prior to visit.    Allergies  Allergen Reactions   Citrus Hives and Itching   Food Hives, Itching and Other (See Comments)    Pt confirmed that it is not an anaphylactic reaction   Ibuprofen Itching   Strawberry Flavor     Hives all over body.    Toradol [Ketorolac Tromethamine] Hives and Itching       Objective:    BP (!) 189/129   Pulse 100   Ht 5\' 1"  (1.549 m)   Wt 114 lb 12.8 oz (52.1 kg)   LMP 12/18/2022 (Approximate)   SpO2 97%   BMI 21.69 kg/m  Wt Readings from Last 3 Encounters:  02/03/23 114 lb 12.8 oz (52.1 kg)  11/09/22 127 lb (57.6 kg)  08/28/22 114 lb 11.2 oz (52 kg)    Physical Exam Vitals and nursing note reviewed.  Constitutional:      Appearance: She is well-developed.  HENT:     Head: Normocephalic and atraumatic.     Right Ear: A middle ear effusion is present.     Left Ear: A middle ear effusion is present.     Nose:     Right Turbinates: Enlarged, swollen and pale.     Left Turbinates: Enlarged, swollen and pale.     Mouth/Throat:     Mouth: Mucous membranes are moist.     Pharynx: No posterior oropharyngeal erythema  or uvula swelling.     Tonsils: No tonsillar abscesses.  Cardiovascular:     Rate and Rhythm: Normal rate and regular rhythm.     Heart sounds: Normal heart sounds. No murmur heard.    No friction rub. No gallop.  Pulmonary:     Effort: Pulmonary effort is normal. No tachypnea or respiratory distress.     Breath sounds: Normal breath sounds. No decreased breath sounds, wheezing, rhonchi or rales.  Chest:     Chest wall: No tenderness.  Abdominal:     General: Bowel sounds are normal.     Palpations: Abdomen is soft.  Musculoskeletal:        General: Normal range of motion.     Cervical back: Normal range of motion.  Skin:    General: Skin is warm and dry.  Neurological:     Mental Status: She is alert and oriented to person, place, and time.  Coordination: Coordination normal.  Psychiatric:        Behavior: Behavior normal. Behavior is cooperative.        Thought Content: Thought content normal.        Judgment: Judgment normal.          Patient has been counseled extensively about nutrition and exercise as well as the importance of adherence with medications and regular follow-up. The patient was given clear instructions to go to ER or return to medical center if symptoms don't improve, worsen or new problems develop. The patient verbalized understanding.   Follow-up: Return in about 3 months (around 05/05/2023).   Claiborne Rigg, FNP-BC [facility] [place], [place] [phone]   02/06/2023, 5:43 PM

## 2023-12-06 ENCOUNTER — Encounter: Payer: Self-pay | Admitting: Internal Medicine

## 2023-12-06 ENCOUNTER — Ambulatory Visit: Payer: MEDICAID | Attending: Internal Medicine | Admitting: Internal Medicine

## 2023-12-06 VITALS — BP 144/97 | HR 91 | Temp 98.1°F | Ht 61.0 in | Wt 118.0 lb

## 2023-12-06 DIAGNOSIS — Z91018 Allergy to other foods: Secondary | ICD-10-CM

## 2023-12-06 DIAGNOSIS — R21 Rash and other nonspecific skin eruption: Secondary | ICD-10-CM

## 2023-12-06 DIAGNOSIS — J301 Allergic rhinitis due to pollen: Secondary | ICD-10-CM | POA: Diagnosis not present

## 2023-12-06 DIAGNOSIS — I10 Essential (primary) hypertension: Secondary | ICD-10-CM

## 2023-12-06 DIAGNOSIS — Z114 Encounter for screening for human immunodeficiency virus [HIV]: Secondary | ICD-10-CM

## 2023-12-06 MED ORDER — FLUTICASONE PROPIONATE 50 MCG/ACT NA SUSP: 2.0000 | Freq: Every day | NASAL | 0 refills | Status: DC

## 2023-12-06 MED ORDER — TERBINAFINE HCL 1 % EX CREA: 1.0000 | TOPICAL_CREAM | Freq: Two times a day (BID) | CUTANEOUS | 0 refills | Status: DC

## 2023-12-06 MED ORDER — VALSARTAN 40 MG PO TABS: 40.0000 mg | ORAL_TABLET | Freq: Every day | ORAL | 0 refills | Status: DC

## 2023-12-06 MED ORDER — TRIAMCINOLONE ACETONIDE 0.1 % EX CREA: 1.0000 | TOPICAL_CREAM | Freq: Two times a day (BID) | CUTANEOUS | 0 refills | Status: DC

## 2023-12-06 NOTE — Progress Notes (Signed)
Patient ID: Jessica Case, female    DOB: 04/23/1981  MRN: [id number]  CC: Rash (Rash on leg, stomach, hip, back, arms X3 weeks/Med refill. )   Subjective: Jessica Case is a 43 y.o. female who presents for UC visit. PCP is NP Meredeth Ide Her concerns today include:  Patient with history of HTN, migraines, asthma, allergic rhinitis, tobacco dependence, alcohol use disorder,  Pt c/o itchy rash on leg, stomach, hip, back, arms X3 weeks Thinks it may have been due to strawberry juice that she drank 3 wks ago. Also has been drinking juice that has citrus in it.  Has allergies to strawberries and citrus foods. Lives in a tent with her husband and does not have access to water every day.  Just started using new deterogent 1.5 mths ago.  Needs RF on Valsartan for BP. Not taking it consistently because she does not feel BP elev every day. Out x 2 mths.   No device to check BP Patient Active Problem List   Diagnosis Date Noted   ASCUS with positive high risk HPV cervical 11/09/2022   Thrombocytopenia, unspecified (HCC) 08/28/2022   Alcohol withdrawal seizure (HCC) 09/24/2020   CAP (community acquired pneumonia) 09/24/2020   Alcohol dependence (HCC) 09/24/2020   Elevated LFTs 09/24/2020   Asthma 04/15/2020   Paranoid schizophrenia (HCC) 10/31/2017   Right foot pain 05/11/2017   Essential hypertension 10/08/2016   Cocaine substance abuse (HCC) 10/08/2016   Fibroids, subserous 08/18/2016   Hydrosalpinx 04/13/2016   Tobacco dependency 09/04/2015   Migraine with status migrainosus 08/15/2015   Allergic rhinitis 07/18/2015   Pain due to dental caries 07/12/2014     Current Outpatient Medications on File Prior to Visit  Medication Sig Dispense Refill   fluticasone (FLONASE) 50 MCG/ACT nasal spray Place 2 sprays into both nostrils daily. 16 g 0   loratadine (CLARITIN) 10 MG tablet Take 1 tablet (10 mg total) by mouth daily. 90 tablet 3   predniSONE (DELTASONE) 20 MG tablet Take 1  tablet (20 mg total) by mouth daily with breakfast. 3 tablet 0   valsartan (DIOVAN) 40 MG tablet Take 1 tablet (40 mg total) by mouth daily. 90 tablet 3   No current facility-administered medications on file prior to visit.    Allergies  Allergen Reactions   Citrus Hives and Itching   Food Hives, Itching and Other (See Comments)    Pt confirmed that it is not an anaphylactic reaction   Ibuprofen Itching   Strawberry Flavoring Agent (Non-Screening)     Hives all over body.    Toradol [Ketorolac Tromethamine] Hives and Itching    Social History   Socioeconomic History   Marital status: Single    Spouse name: Not on file   Number of children: 0   Years of education: Not on file   Highest education level: Not on file  Occupational History   Not on file  Tobacco Use   Smoking status: Every Day    Types: Cigars, Cigarettes   Smokeless tobacco: Never  Vaping Use   Vaping status: Never Used  Substance and Sexual Activity   Alcohol use: Yes    Alcohol/week: 12.0 standard drinks of alcohol    Types: 12 Cans of beer per week   Drug use: Yes    Types: Marijuana, "Crack" cocaine   Sexual activity: Yes    Birth control/protection: None  Other Topics Concern   Not on file  Social History Narrative   8th grade education. Pt  currently homeless, stays with mother, or fiancee   Social Drivers of Health   Financial Resource Strain: Not on file  Food Insecurity: Not on file  Transportation Needs: Not on file  Physical Activity: Not on file  Stress: Not on file  Social Connections: Not on file  Intimate Partner Violence: Not on file    Family History  Problem Relation Age of Onset   Hypertension Mother     Past Surgical History:  Procedure Laterality Date   BUNIONECTOMY Bilateral    LAPAROSCOPIC UNILATERAL SALPINGECTOMY Left 08/18/2016   Procedure: LAPAROSCOPIC UNILATERAL SALPINGECTOMY;  Surgeon: Willodean Rosenthal, MD;  Location: [facility];  Service: Gynecology;   Laterality: Left;   LAPAROSCOPY  08/18/2016   Procedure: LAPAROSCOPY OPERATIVE;  Surgeon: Willodean Rosenthal, MD;  [facility];  Service: Gynecology;;   LAPAROTOMY  08/18/2016   Procedure: LAPAROTOMY;  Surgeon: Willodean Rosenthal, MD;  [facility];  Service: Gynecology;;  mini laparotomy   MYOMECTOMY  08/18/2016   Procedure: Claria Dice;  Surgeon: Willodean Rosenthal, MD;  [facility];  Service: Gynecology;;   ORIF ELBOW FRACTURE Left 10/23/2020   Procedure: OPEN REDUCTION INTERNAL FIXATION (ORIF) ELBOW/OLECRANON FRACTURE;  Surgeon: Bradly Bienenstock, MD;  Location[facility];  Service: Orthopedics;  Laterality: Left;   TOOTH EXTRACTION      ROS: Review of Systems Negative except as stated above  PHYSICAL EXAM: BP (!) 159/92 (BP Location: Left Arm, Patient Position: Sitting, Cuff Size: Normal)   Pulse 91   Temp 98.1 F (36.7 C) (Oral)   Ht 5\' 1"  (1.549 m)   Wt 118 lb (53.5 kg)   SpO2 98%   BMI 22.30 kg/m   Physical Exam  General appearance -middle-age African-American female in NAD Skin -scattered hyperpigmented spots noted across chest and abdomen.  Some of them are more defined being circular and salmon in color.  Please see pictures below         Latest Ref Rng & Units 08/28/2022    3:21 PM 04/15/2022   10:32 AM 11/01/2020    7:06 PM  CMP  Glucose 70 - 99 mg/dL 80  75  161   BUN 6 - 24 mg/dL 5  6  <5   Creatinine 0.96 - 1.00 mg/dL 0.45  4.09  8.11   Sodium 134 - 144 mmol/L 143  141  142   Potassium 3.5 - 5.2 mmol/L 4.4  4.7  3.9   Chloride 96 - 106 mmol/L 104  103  106   CO2 20 - 29 mmol/L 25  19  24    Calcium 8.7 - 10.2 mg/dL 9.6  9.4  9.1   Total Protein 6.0 - 8.5 g/dL 7.5  7.4    Total Bilirubin 0.0 - 1.2 mg/dL 0.4  0.5    Alkaline Phos 44 - 121 IU/L 88  95    AST 0 - 40 IU/L 44  29    ALT 0 - 32 IU/L 38  12     Lipid Panel     Component Value Date/Time   CHOL 243 (H) 08/28/2022 1521   TRIG 165 (H) 08/28/2022 1521   HDL 141 08/28/2022  1521   CHOLHDL 1.7 08/28/2022 1521   LDLCALC 76 08/28/2022 1521    CBC    Component Value Date/Time   WBC 8.1 08/28/2022 1521   WBC 6.0 11/01/2020 1906   RBC 3.94 08/28/2022 1521   RBC 3.26 (L) 11/01/2020 1906   HGB 12.6 08/28/2022 1521   HCT 37.6 08/28/2022  1521   PLT 284 08/28/2022 1521   MCV 95 08/28/2022 1521   MCH 32.0 08/28/2022 1521   MCH 34.7 (H) 11/01/2020 1906   MCHC 33.5 08/28/2022 1521   MCHC 35.0 11/01/2020 1906   RDW 12.9 08/28/2022 1521   LYMPHSABS 2.8 08/28/2022 1521   MONOABS 0.7 09/23/2020 2204   EOSABS 0.2 08/28/2022 1521   BASOSABS 0.0 08/28/2022 1521    ASSESSMENT AND PLAN: 1. Rash (Primary) Pityriasis rosea plus or minus tinea skin infection.  Will give a trial of triamcinolone cream and Lamisil. - triamcinolone cream (KENALOG) 0.1 %; Apply 1 Application topically 2 (two) times daily.  Dispense: 30 g; Refill: 0 - terbinafine (LAMISIL AT) 1 % cream; Apply 1 Application topically 2 (two) times daily.  Dispense: 30 g; Refill: 0  2. Essential hypertension Not at goal.  She has been out of Diovan and was not taking consistently.  Refill given.  Advised to take the medicine consistently.  Will have her follow-up with her PCP in several weeks for chronic disease management. - valsartan (DIOVAN) 40 MG tablet; Take 1 tablet (40 mg total) by mouth daily.  Dispense: 90 tablet; Refill: 0 - Basic Metabolic Panel  3. Food allergy Advised to avoid foods that she knows she is allergic to  4. Seasonal allergic rhinitis due to pollen Patient requested refill on Flonase nasal spray- fluticasone (FLONASE) 50 MCG/ACT nasal spray; Place 2 sprays into both nostrils daily.  Dispense: 16 g; Refill: 0  5. Screening for HIV (human immunodeficiency virus) - HIV antibody (with reflex)   Patient was given the opportunity to ask questions.  Patient verbalized understanding of the plan and was able to repeat key elements of the plan.   This documentation was completed using  Paediatric nurse.  Any transcriptional errors are unintentional.  No orders of the defined types were placed in this encounter.    Requested Prescriptions   Pending Prescriptions Disp Refills   fluticasone (FLONASE) 50 MCG/ACT nasal spray 16 g 0    Sig: Place 2 sprays into both nostrils daily.   valsartan (DIOVAN) 40 MG tablet 90 tablet 3    Sig: Take 1 tablet (40 mg total) by mouth daily.    No follow-ups on file.  Jonah Blue, MD, FACP

## 2023-12-07 LAB — BASIC METABOLIC PANEL
BUN/Creatinine Ratio: 8 — ABNORMAL LOW (ref 9–23)
BUN: 6 mg/dL (ref 6–24)
CO2: 24 mmol/L (ref 20–29)
Calcium: 9.4 mg/dL (ref 8.7–10.2)
Chloride: 102 mmol/L (ref 96–106)
Creatinine, Ser: 0.73 mg/dL (ref 0.57–1.00)
Glucose: 76 mg/dL (ref 70–99)
Potassium: 4.2 mmol/L (ref 3.5–5.2)
Sodium: 141 mmol/L (ref 134–144)
eGFR: 105 mL/min/{1.73_m2} (ref >59)

## 2023-12-07 LAB — HIV ANTIBODY (ROUTINE TESTING W REFLEX): HIV Screen 4th Generation wRfx: NONREACTIVE

## 2023-12-08 ENCOUNTER — Ambulatory Visit: Payer: MEDICAID | Admitting: Nurse Practitioner

## 2024-01-05 ENCOUNTER — Other Ambulatory Visit (HOSPITAL_COMMUNITY)
Admission: RE | Admit: 2024-01-05 | Discharge: 2024-01-05 | Disposition: A | Discharge status: Home / Self Care | Payer: MEDICAID | Source: Ambulatory Visit | Attending: Nurse Practitioner | Admitting: Nurse Practitioner

## 2024-01-05 ENCOUNTER — Encounter: Payer: Self-pay | Admitting: Nurse Practitioner

## 2024-01-05 ENCOUNTER — Ambulatory Visit: Payer: MEDICAID | Attending: Nurse Practitioner | Admitting: Nurse Practitioner

## 2024-01-05 VITALS — BP 124/85 | HR 92 | Resp 20 | Ht 61.0 in | Wt 114.8 lb

## 2024-01-05 DIAGNOSIS — B359 Dermatophytosis, unspecified: Secondary | ICD-10-CM

## 2024-01-05 DIAGNOSIS — R102 Pelvic and perineal pain: Secondary | ICD-10-CM | POA: Insufficient documentation

## 2024-01-05 DIAGNOSIS — J301 Allergic rhinitis due to pollen: Secondary | ICD-10-CM

## 2024-01-05 DIAGNOSIS — I10 Essential (primary) hypertension: Secondary | ICD-10-CM | POA: Diagnosis not present

## 2024-01-05 MED ORDER — VALSARTAN 40 MG PO TABS: 40.0000 mg | ORAL_TABLET | Freq: Every day | ORAL | 1 refills | Status: DC

## 2024-01-05 MED ORDER — TERBINAFINE HCL 1 % EX CREA: 1.0000 | TOPICAL_CREAM | Freq: Two times a day (BID) | CUTANEOUS | 0 refills | Status: DC

## 2024-01-05 MED ORDER — LORATADINE 10 MG PO TABS
10.0000 mg | ORAL_TABLET | Freq: Every day | ORAL | 3 refills | Status: AC
Start: 2024-01-05 — End: ?

## 2024-01-05 NOTE — Progress Notes (Unsigned)
 Assessment & Plan:  Jessica Case was seen today for medical management of chronic issues.  Diagnoses and all orders for this visit:  Primary hypertension -     valsartan (DIOVAN) 40 MG tablet; Take 1 tablet (40 mg total) by mouth daily. Continue all antihypertensives as prescribed.  Reminded to bring in blood pressure log for follow  up appointment.  RECOMMENDATIONS: DASH/Mediterranean Diets are healthier choices for HTN.    Seasonal allergic rhinitis due to pollen -     loratadine (CLARITIN) 10 MG tablet; Take 1 tablet (10 mg total) by mouth daily.  Ringworm -     terbinafine (LAMISIL AT) 1 % cream; Apply 1 Application topically 2 (two) times daily. RINGWORM  Pelvic pain -     Cervicovaginal ancillary only -     Urinalysis, Complete -     Microscopic Examination    Patient has been counseled on age-appropriate routine health concerns for screening and prevention. These are reviewed and up-to-date. Referrals have been placed accordingly. Immunizations are up-to-date or declined.    Subjective:   Chief Complaint  Patient presents with   Medical Management of Chronic Issues    Jessica Case 43 y.o. female presents to office today for HTN and f/u to rash.    HTN Blood pressure is well controlled today. She is taking valsartan 40 mg daily BP Readings from Last 3 Encounters:  01/05/24 124/85  12/06/23 (!) 144/97  02/03/23 (!) 189/129    GU She is endorsing left sided pelvic pain today. Denies any vaginal odor, abnormal discharge or dysuria.   RASH [name] was treated for rash 4 weeks ago with triamcinolone and lamisil cream. Unfortunately she did not receive the lamisil from the pharmacy and has only been using the triamcinolone cream. Rash appears to have spread to other places on her body compared to previous photos. Associated symptoms: pruritus.     Review of Systems  Constitutional:  Negative for fever, malaise/fatigue and weight loss.  HENT: Negative.   Negative for nosebleeds.   Eyes: Negative.  Negative for blurred vision, double vision and photophobia.  Respiratory: Negative.  Negative for cough and shortness of breath.   Cardiovascular: Negative.  Negative for chest pain, palpitations and leg swelling.  Gastrointestinal:  Positive for abdominal pain. Negative for heartburn, nausea and vomiting.  Musculoskeletal: Negative.  Negative for myalgias.  Skin:  Positive for itching and rash.  Neurological: Negative.  Negative for dizziness, focal weakness, seizures and headaches.  Endo/Heme/Allergies:  Positive for environmental allergies.  Psychiatric/Behavioral: Negative.  Negative for suicidal ideas.     Past Medical History:  Diagnosis Date   Alcohol withdrawal seizure (HCC) 09/24/2020   Arthritis    Claustrophobia    Cold    currently - no meds   Fallopian tube abscess    Fibroids    Hypertension    "some times" -    Paranoid schizophrenia (HCC)    Pneumonia 09/24/2020   SVD (spontaneous vaginal delivery)    x 1    Past Surgical History:  Procedure Laterality Date   BUNIONECTOMY Bilateral    LAPAROSCOPIC UNILATERAL SALPINGECTOMY Left 08/18/2016   Procedure: LAPAROSCOPIC UNILATERAL SALPINGECTOMY;  Surgeon: Willodean Rosenthal, MD;  Location: [facility];  Service: Gynecology;  Laterality: Left;   LAPAROSCOPY  08/18/2016   Procedure: LAPAROSCOPY OPERATIVE;  Surgeon: Willodean Rosenthal, MD;  Location: [facility];  Service: Gynecology;;   LAPAROTOMY  08/18/2016   Procedure: LAPAROTOMY;  Surgeon: Willodean Rosenthal, MD;  Location: [facility];  Service:  Gynecology;;  mini laparotomy   MYOMECTOMY  08/18/2016   Procedure: MYOMECTOMY;  Surgeon: Willodean Rosenthal, MD;  Location: [facility];  Service: Gynecology;;   ORIF ELBOW FRACTURE Left 10/23/2020   Procedure: OPEN REDUCTION INTERNAL FIXATION (ORIF) ELBOW/OLECRANON FRACTURE;  Surgeon: Bradly Bienenstock, MD;  Location: [facility] OR;  Service: Orthopedics;  Laterality: Left;   TOOTH EXTRACTION       Family History  Problem Relation Age of Onset   Hypertension Mother     Social History Reviewed with no changes to be made today.   Outpatient Medications Prior to Visit  Medication Sig Dispense Refill   fluticasone (FLONASE) 50 MCG/ACT nasal spray Place 2 sprays into both nostrils daily. 16 g 0   triamcinolone cream (KENALOG) 0.1 % Apply 1 Application topically 2 (two) times daily. 30 g 0   valsartan (DIOVAN) 40 MG tablet Take 1 tablet (40 mg total) by mouth daily. 90 tablet 0   loratadine (CLARITIN) 10 MG tablet Take 1 tablet (10 mg total) by mouth daily. (Patient not taking: Reported on 01/05/2024) 90 tablet 3   terbinafine (LAMISIL AT) 1 % cream Apply 1 Application topically 2 (two) times daily. (Patient not taking: Reported on 01/05/2024) 30 g 0   No facility-administered medications prior to visit.    Allergies  Allergen Reactions   Citrus Hives and Itching   Food Hives, Itching and Other (See Comments)    Pt confirmed that it is not an anaphylactic reaction   Ibuprofen Itching   Strawberry Flavoring Agent (Non-Screening)     Hives all over body.    Toradol [Ketorolac Tromethamine] Hives and Itching       Objective:    BP 124/85 (BP Location: Left Arm, Patient Position: Sitting, Cuff Size: Normal)   Pulse 92   Resp 20   Ht 5\' 1"  (1.549 m)   Wt 114 lb 12.8 oz (52.1 kg)   SpO2 99%   BMI 21.69 kg/m  Wt Readings from Last 3 Encounters:  01/05/24 114 lb 12.8 oz (52.1 kg)  12/06/23 118 lb (53.5 kg)  02/03/23 114 lb 12.8 oz (52.1 kg)    Physical Exam Vitals and nursing note reviewed.  Constitutional:      Appearance: She is well-developed.  HENT:     Head: Normocephalic and atraumatic.  Cardiovascular:     Rate and Rhythm: Normal rate and regular rhythm.     Heart sounds: Normal heart sounds. No murmur heard.    No friction rub. No gallop.  Pulmonary:     Effort: Pulmonary effort is normal. No tachypnea or respiratory distress.     Breath sounds: Normal  breath sounds. No decreased breath sounds, wheezing, rhonchi or rales.  Chest:     Chest wall: No tenderness.  Abdominal:     General: Bowel sounds are normal.     Palpations: Abdomen is soft.  Musculoskeletal:        General: Normal range of motion.     Cervical back: Normal range of motion.  Skin:    General: Skin is warm and dry.     Findings: Rash present.     Comments: See attached photos  Neurological:     Mental Status: She is alert and oriented to person, place, and time.     Coordination: Coordination normal.  Psychiatric:        Behavior: Behavior normal. Behavior is cooperative.        Thought Content: Thought content normal.  Judgment: Judgment normal.          Patient has been counseled extensively about nutrition and exercise as well as the importance of adherence with medications and regular follow-up. The patient was given clear instructions to go to ER or return to medical center if symptoms don't improve, worsen or new problems develop. The patient verbalized understanding.   Follow-up: Return in about 3 months (around 04/06/2024) for PAP SMEAR.   Claiborne Rigg, FNP-BC [facility], [place] [phone]   01/06/2024, 11:35 AM

## 2024-01-05 NOTE — Progress Notes (Unsigned)
 Left side abdominal pressure and continued rash

## 2024-01-06 ENCOUNTER — Other Ambulatory Visit: Payer: Self-pay | Admitting: Nurse Practitioner

## 2024-01-06 ENCOUNTER — Encounter: Payer: Self-pay | Admitting: Nurse Practitioner

## 2024-01-06 DIAGNOSIS — N390 Urinary tract infection, site not specified: Secondary | ICD-10-CM

## 2024-01-06 LAB — CERVICOVAGINAL ANCILLARY ONLY
Bacterial Vaginitis (gardnerella): POSITIVE — AB
Candida Glabrata: NEGATIVE
Candida Vaginitis: NEGATIVE
Chlamydia: NEGATIVE
Comment: NEGATIVE
Comment: NEGATIVE
Comment: NEGATIVE
Comment: NEGATIVE
Comment: NEGATIVE
Comment: NORMAL
Neisseria Gonorrhea: NEGATIVE
Trichomonas: POSITIVE — AB

## 2024-01-06 LAB — URINALYSIS, COMPLETE
Bilirubin, UA: NEGATIVE
Glucose, UA: NEGATIVE
Ketones, UA: NEGATIVE
Nitrite, UA: POSITIVE — AB
Protein,UA: NEGATIVE
RBC, UA: NEGATIVE
Specific Gravity, UA: 1.014 (ref 1.005–1.030)
Urobilinogen, Ur: 1 mg/dL (ref 0.2–1.0)
pH, UA: 5.5 (ref 5.0–7.5)

## 2024-01-06 LAB — MICROSCOPIC EXAMINATION
Casts: NONE SEEN /LPF
Epithelial Cells (non renal): >10 /HPF — AB (ref 0–10)
RBC, Urine: NONE SEEN /HPF (ref 0–2)

## 2024-01-06 MED ORDER — NITROFURANTOIN MONOHYD MACRO 100 MG PO CAPS: 100.0000 mg | ORAL_CAPSULE | Freq: Two times a day (BID) | ORAL | 0 refills | Status: AC

## 2024-01-07 ENCOUNTER — Ambulatory Visit: Payer: Self-pay

## 2024-01-07 NOTE — Telephone Encounter (Signed)
 Attempt #1. Left voicemail to return call.   Copied from CRM [id number]. Topic: Clinical - Medication Question >> Jan 07, 2024  1:42 PM Thliyah D wrote: Patient is having trouble getting this prescription- terbinafine (LAMISIL AT) 1 % cream At her pharmacy please call her back. Callback-[phone] >> Jan 07, 2024  1:44 PM WUXLKGM D wrote: She doesn't want the otc that [facility] is trying to give her

## 2024-01-07 NOTE — Telephone Encounter (Signed)
 2nd attempt to contact patient regarding medication request. No answer, LVMTCB [phone].  Patient is having trouble getting this prescription- terbinafine (LAMISIL AT) 1 % cream At her pharmacy please call her back. Callback-[phone] >> Jan 07, 2024  1:44 PM QMVHQIO D wrote: She doesn't want the otc that [facility] is trying to give her

## 2024-01-07 NOTE — Telephone Encounter (Signed)
 3rd attempt, Spoke to female at pt's number who advised pt is not available but he will have her return call to clinic when she is able.

## 2024-01-09 ENCOUNTER — Other Ambulatory Visit: Payer: Self-pay | Admitting: Nurse Practitioner

## 2024-01-09 MED ORDER — METRONIDAZOLE 500 MG PO TABS: 500.0000 mg | ORAL_TABLET | Freq: Two times a day (BID) | ORAL | 0 refills | Status: AC

## 2024-01-09 MED ORDER — METRONIDAZOLE 500 MG PO TABS: 500.0000 mg | ORAL_TABLET | Freq: Three times a day (TID) | ORAL | 0 refills | Status: DC

## 2024-01-25 ENCOUNTER — Ambulatory Visit (INDEPENDENT_AMBULATORY_CARE_PROVIDER_SITE_OTHER): Payer: MEDICAID | Admitting: Podiatry

## 2024-01-25 ENCOUNTER — Encounter: Payer: Self-pay | Admitting: Podiatry

## 2024-01-25 DIAGNOSIS — B351 Tinea unguium: Secondary | ICD-10-CM

## 2024-01-25 MED ORDER — AMMONIUM LACTATE 12 % EX CREA
1.0000 | TOPICAL_CREAM | CUTANEOUS | 0 refills | Status: AC | PRN
Start: 2024-01-25 — End: ?

## 2024-01-27 NOTE — Progress Notes (Signed)
 Subjective: Chief Complaint  Patient presents with   Nail Problem    RM#11 Patient states having fungus concerns on right toe nails treating with anti fungal cream not working.   43 year old female presents the office with concerns of fungus to her big toenail particular on the right side.  She has been using over-the-counter cream without any improvement.  She did use formula 7 which has been helpful.  No swelling redness or drainage.  No pain.   Objective: AAO x3, NAD DP/PT pulses palpable bilaterally, CRT less than 3 seconds Hallux is particularly hypertrophic, dystrophic with yellow, brown discoloration there is no edema, erythema or signs of infection.  There are no open lesion identified this time.  There is no area pinpoint tenderness. Dry skin is present. No pain with calf compression, swelling, warmth, erythema  Assessment: Onychomycosis, dry skin  Plan: -All treatment options discussed with the patient including all alternatives, risks, complications.  -Discussed treatment options for nail fungus.  Plan continue with topical medication.  She is to give formula 7.  Prescribed AmLactin for dry skin.  Discussed daily foot inspection. -Patient encouraged to call the office with any questions, concerns, change in symptoms.   Vivi Barrack DPM

## 2024-03-21 ENCOUNTER — Telehealth: Payer: Self-pay | Admitting: Nurse Practitioner

## 2024-03-21 ENCOUNTER — Other Ambulatory Visit: Payer: Self-pay | Admitting: Nurse Practitioner

## 2024-03-21 ENCOUNTER — Ambulatory Visit: Payer: Self-pay

## 2024-03-21 DIAGNOSIS — J301 Allergic rhinitis due to pollen: Secondary | ICD-10-CM

## 2024-03-21 MED ORDER — FLUTICASONE PROPIONATE 50 MCG/ACT NA SUSP: 2.0000 | Freq: Every day | NASAL | 0 refills | Status: AC

## 2024-03-21 NOTE — Telephone Encounter (Signed)
  Chief Complaint: rash Symptoms: circular rash to buttock, BUE Frequency: about a week Pertinent Negatives: Patient denies fever, others in household with same rash Disposition: [] ED /[] Urgent Care (no appt availability in office) / [x] Appointment(In office/virtual)/ []  [facility]/ [] Home Care/ [] Refused Recommended Disposition /[] [facility]/ []  Follow-up with PCP Additional Notes: Routing HP for scheduling.  1. APPEARANCE of RASH: "Describe the rash."      Circular, inside is brown, but around the circle is red, looks like a ring 2. LOCATION: "Where is the rash located?"      Arms and buttocks 3. NUMBER: "How many spots are there?"      unsure 4. SIZE: "How big are the spots?" (Inches, centimeters or compare to size of a coin)      Bigger and bigger and bigger 5. ONSET: "When did the rash start?"      About a week 6. ITCHING: "Does the rash itch?" If Yes, ask: "How bad is the itch?"  (Scale 0-10; or none, mild, moderate, severe)     Some are itchy like mosquito bites, some aren't itching 7. PAIN: "Does the rash hurt?" If Yes, ask: "How bad is the pain?"  (Scale 0-10; or none, mild, moderate, severe)    - NONE (0): no pain    - MILD (1-3): doesn't interfere with normal activities     - MODERATE (4-7): interferes with normal activities or awakens from sleep     - SEVERE (8-10): excruciating pain, unable to do any normal activities     denies 8. OTHER SYMPTOMS: "Do you have any other symptoms?" (e.g., fever)     denies 9. PREGNANCY: "Is there any chance you are pregnant?" "When was your last menstrual period?"     I don't know, yes

## 2024-03-21 NOTE — Telephone Encounter (Signed)
 Reason for Disposition . Localized rash present > 7 days  Answer Assessment - Initial Assessment Questions 1. APPEARANCE of RASH: "Describe the rash."      Circular, inside is brown, but around the circle is red, looks like a ring 2. LOCATION: "Where is the rash located?"      Arms and buttocks 3. NUMBER: "How many spots are there?"      unsure 4. SIZE: "How big are the spots?" (Inches, centimeters or compare to size of a coin)      Bigger and bigger and bigger 5. ONSET: "When did the rash start?"      About a week 6. ITCHING: "Does the rash itch?" If Yes, ask: "How bad is the itch?"  (Scale 0-10; or none, mild, moderate, severe)     Some are itchy like mosquito bites, some aren't itching 7. PAIN: "Does the rash hurt?" If Yes, ask: "How bad is the pain?"  (Scale 0-10; or none, mild, moderate, severe)    - NONE (0): no pain    - MILD (1-3): doesn't interfere with normal activities     - MODERATE (4-7): interferes with normal activities or awakens from sleep     - SEVERE (8-10): excruciating pain, unable to do any normal activities     denies 8. OTHER SYMPTOMS: "Do you have any other symptoms?" (e.g., fever)     denies 9. PREGNANCY: "Is there any chance you are pregnant?" "When was your last menstrual period?"     I don't know, yes  Protocols used: Rash or Redness - Localized-A-AH

## 2024-03-21 NOTE — Telephone Encounter (Signed)
 FYI Copied from CRM [id number]. Topic: General - Other >> Mar 21, 2024  9:21 AM Alpha Arts wrote:  Reason for CRM: Patient will be dropping off paperwork for Winda Hastings NP to fill out for home health aid.  Callback #: [phone]

## 2024-03-21 NOTE — Telephone Encounter (Signed)
 Left message on voicemail to return call.

## 2024-03-21 NOTE — Telephone Encounter (Signed)
 Copied From CRM [id number]. Reason for Triage: Circle like rash on arms. Itching on back and arm area  Callback #: [phone]  Preferred Pharmacy: [facility] [phone] - Jessica Case, [place] - [address] AT [facility] [address] [place] [address] Phone: [phone] Fax: [phone] Hours: Not open 24 hours  First attempt; no answer

## 2024-03-21 NOTE — Telephone Encounter (Signed)
 Noted

## 2024-03-21 NOTE — Telephone Encounter (Signed)
 Copied from CRM [id number]. Topic: Clinical - Medication Refill >> Mar 21, 2024  9:31 AM Lesta Rater S wrote: Medication: fluticasone  (FLONASE ) 50 MCG/ACT nasal spray   Has the patient contacted their pharmacy? Yes (Agent: If no, request that the patient contact the pharmacy for the refill. If patient does not wish to contact the pharmacy document the reason why and proceed with request.) (Agent: If yes, when and what did the pharmacy advise?)  This is the patient's preferred pharmacy:  [facility] [phone] - [place], [place] - [address] AT [facility] [address] [place] [address] Phone: [phone] Fax: [phone]  Is this the correct pharmacy for this prescription? Yes If no, delete pharmacy and type the correct one.   Has the prescription been filled recently? Yes  Is the patient out of the medication? Yes  Has the patient been seen for an appointment in the last year OR does the patient have an upcoming appointment? Yes  Can we respond through MyChart? Yes  Agent: Please be advised that Rx refills may take up to 3 business days. We ask that you follow-up with your pharmacy.

## 2024-03-27 ENCOUNTER — Other Ambulatory Visit: Payer: Self-pay | Admitting: Nurse Practitioner

## 2024-03-27 DIAGNOSIS — B359 Dermatophytosis, unspecified: Secondary | ICD-10-CM

## 2024-03-27 NOTE — Telephone Encounter (Unsigned)
 Copied from CRM [id number]. Topic: Clinical - Medication Refill >> Mar 27, 2024 11:00 AM DeAngela L wrote: Medication: terbinafine  (LAMISIL  AT) 1 % cream   Has the patient contacted their pharmacy? No  (Agent: If no, request that the patient contact the pharmacy for the refill. If patient does not wish to contact the pharmacy document the reason why and proceed with request.) (Agent: If yes, when and what did the pharmacy advise?)  This is the patient's preferred pharmacy:  [facility] [phone] - [place], [place] - [address] AT [facility] [address]  [place] [address] Phone: [phone] Fax: [phone]  Is this the correct pharmacy for this prescription? Yes  If no, delete pharmacy and type the correct one.   Has the prescription been filled recently? Yes   Is the patient out of the medication? Yes   Has the patient been seen for an appointment in the last year OR does the patient have an upcoming appointment? Yes   Can we respond through MyChart? No   Agent: Please be advised that Rx refills may take up to 3 business days. We ask that you follow-up with your pharmacy.

## 2024-03-28 MED ORDER — TERBINAFINE HCL 1 % EX CREA
1.0000 | TOPICAL_CREAM | Freq: Two times a day (BID) | CUTANEOUS | 0 refills | Status: AC
Start: 2024-03-28 — End: ?

## 2024-03-28 NOTE — Telephone Encounter (Signed)
 Requested medication (s) are due for refill today:  Requested medication (s) are on the active medication list: yes  Last refill:  01/05/24  Future visit scheduled: yes  Notes to clinic:  Medication not assigned to a protocol, review manually.      Requested Prescriptions  Pending Prescriptions Disp Refills   terbinafine  (LAMISIL  AT) 1 % cream 60 g 0    Sig: Apply 1 Application topically 2 (two) times daily. RINGWORM     Off-Protocol Failed - 03/28/2024 11:55 AM      Failed - Medication not assigned to a protocol, review manually.      Passed - Valid encounter within last 12 months    Recent Outpatient Visits           2 months ago Primary hypertension   [facility] [place] Collins Dean, NP   3 months ago Rash   [facility] [place]. [facility] Presume, MD   1 year ago Essential hypertension   [facility] [place]. [facility] Collins Dean, NP   1 year ago Encounter for Papanicolaou smear for cervical cancer screening   [facility] [place]. [facility] Collins Dean, NP   1 year ago Essential hypertension   [facility] [place]. [facility] Collins Dean, [place]

## 2024-03-29 ENCOUNTER — Telehealth: Payer: Self-pay

## 2024-03-29 NOTE — Telephone Encounter (Signed)
 I received a PCS request from [facility] that was dropped off at our clinic.  I tried to reach the patient to inquire about her need for assistance with ADLS and had to leave a message requesting a call back.

## 2024-04-03 ENCOUNTER — Telehealth: Payer: Self-pay | Admitting: Nurse Practitioner

## 2024-04-03 NOTE — Telephone Encounter (Signed)
 I spoke to the patient and informed her of the request received for PCS and that I spoke to Ms Jamal Mays, NP and she does not feel she qualifies for PCS at this time.   The patient said that if her insurance company would pay for it, she should have it.  I explained to her that even though the insurance company will pay for the service, she still needs to qualify for the service - needs assistance with bathing, dressing, personal care and at this time, she does not qualify.  I told her that she can discuss with Ms Jamal Mays, NP at her upcoming appointment.

## 2024-04-03 NOTE — Telephone Encounter (Signed)
 Duplicate message

## 2024-04-03 NOTE — Telephone Encounter (Signed)
 Patient is returning call back. Please Advise.

## 2024-04-03 NOTE — Telephone Encounter (Signed)
 Copied from CRM [id number]. Topic: General - Call Back - No Documentation >> Apr 03, 2024  1:05 PM Lizabeth Riggs wrote: Ms. Gieger is calling back to see where her order is for home care in her home. She will have an aide from [facility] and her insurance will not pay for it without an order. Please call Ms. Fraser at [phone].Aaron Aas

## 2024-04-03 NOTE — Telephone Encounter (Signed)
 Copied from CRM [id number]. Topic: General - Call Back - No Documentation >> Apr 03, 2024 10:13 AM Rosaria Common wrote:  Reason for CRM: Pt returning phone call of Burnett Carson, RN from 03/29/24 in regards to Voicemail: I received a PCS request from [facility] that was dropped off at our clinic.  I tried to reach the patient to inquire about her need for assistance with ADLS and had to leave a message requesting a call back. Called clinic and RN is busy. Sent CRM. Pt callback number is [phone].

## 2024-04-05 ENCOUNTER — Ambulatory Visit: Payer: MEDICAID | Admitting: Nurse Practitioner

## 2024-04-28 ENCOUNTER — Telehealth: Payer: Self-pay | Admitting: Nurse Practitioner

## 2024-04-28 NOTE — Telephone Encounter (Signed)
 Called pt to confirm appt. LVM for pt with appt details.

## 2024-05-01 ENCOUNTER — Ambulatory Visit: Payer: MEDICAID | Admitting: Nurse Practitioner

## 2024-06-27 ENCOUNTER — Emergency Department (HOSPITAL_COMMUNITY)
Admission: EM | Admit: 2024-06-27 | Discharge: 2024-06-27 | Discharge status: Against Medical Advice | Payer: MEDICAID | Attending: Emergency Medicine | Admitting: Emergency Medicine

## 2024-06-27 ENCOUNTER — Other Ambulatory Visit: Payer: Self-pay

## 2024-06-27 DIAGNOSIS — Z5321 Procedure and treatment not carried out due to patient leaving prior to being seen by health care provider: Secondary | ICD-10-CM | POA: Insufficient documentation

## 2024-06-27 DIAGNOSIS — R1110 Vomiting, unspecified: Secondary | ICD-10-CM | POA: Diagnosis not present

## 2024-06-27 DIAGNOSIS — R197 Diarrhea, unspecified: Secondary | ICD-10-CM | POA: Insufficient documentation

## 2024-06-27 DIAGNOSIS — H538 Other visual disturbances: Secondary | ICD-10-CM | POA: Insufficient documentation

## 2024-06-27 DIAGNOSIS — R109 Unspecified abdominal pain: Secondary | ICD-10-CM | POA: Diagnosis present

## 2024-06-27 LAB — URINALYSIS, ROUTINE W REFLEX MICROSCOPIC
Bilirubin Urine: NEGATIVE
Glucose, UA: NEGATIVE mg/dL
Hgb urine dipstick: NEGATIVE
Ketones, ur: NEGATIVE mg/dL
Leukocytes,Ua: NEGATIVE
Nitrite: NEGATIVE
Protein, ur: NEGATIVE mg/dL
Specific Gravity, Urine: 1.003 — ABNORMAL LOW (ref 1.005–1.030)
pH: 5 (ref 5.0–8.0)

## 2024-06-27 LAB — CBC WITH DIFFERENTIAL/PLATELET
Abs Immature Granulocytes: 0.02 K/uL (ref 0.00–0.07)
Basophils Absolute: 0 K/uL (ref 0.0–0.1)
Basophils Relative: 0 %
Eosinophils Absolute: 0.1 K/uL (ref 0.0–0.5)
Eosinophils Relative: 1 %
HCT: 33.6 % — ABNORMAL LOW (ref 36.0–46.0)
Hemoglobin: 11.2 g/dL — ABNORMAL LOW (ref 12.0–15.0)
Immature Granulocytes: 0 %
Lymphocytes Relative: 29 %
Lymphs Abs: 2.3 K/uL (ref 0.7–4.0)
MCH: 31.6 pg (ref 26.0–34.0)
MCHC: 33.3 g/dL (ref 30.0–36.0)
MCV: 94.9 fL (ref 80.0–100.0)
Monocytes Absolute: 0.8 K/uL (ref 0.1–1.0)
Monocytes Relative: 10 %
Neutro Abs: 4.6 K/uL (ref 1.7–7.7)
Neutrophils Relative %: 60 %
Platelets: 289 K/uL (ref 150–400)
RBC: 3.54 MIL/uL — ABNORMAL LOW (ref 3.87–5.11)
RDW: 15.3 % (ref 11.5–15.5)
WBC: 7.8 K/uL (ref 4.0–10.5)
nRBC: 0 % (ref 0.0–0.2)

## 2024-06-27 LAB — COMPREHENSIVE METABOLIC PANEL WITH GFR
ALT: 12 U/L (ref 0–44)
AST: 28 U/L (ref 15–41)
Albumin: 4 g/dL (ref 3.5–5.0)
Alkaline Phosphatase: 85 U/L (ref 38–126)
Anion gap: 14 (ref 5–15)
BUN: 5 mg/dL — ABNORMAL LOW (ref 6–20)
CO2: 23 mmol/L (ref 22–32)
Calcium: 9.2 mg/dL (ref 8.9–10.3)
Chloride: 103 mmol/L (ref 98–111)
Creatinine, Ser: 0.76 mg/dL (ref 0.44–1.00)
GFR, Estimated: >60 mL/min (ref >60)
Glucose, Bld: 109 mg/dL — ABNORMAL HIGH (ref 70–99)
Potassium: 3.3 mmol/L — ABNORMAL LOW (ref 3.5–5.1)
Sodium: 139 mmol/L (ref 135–145)
Total Bilirubin: 0.3 mg/dL (ref 0.0–1.2)
Total Protein: 6.7 g/dL (ref 6.5–8.1)

## 2024-06-27 LAB — LIPASE, BLOOD: Lipase: 32 U/L (ref 11–51)

## 2024-06-27 LAB — HCG, SERUM, QUALITATIVE: Preg, Serum: NEGATIVE

## 2024-06-27 NOTE — ED Triage Notes (Signed)
 Pt presents with 2 weeks of abd pain, pt has history of Fibroids, Emesis and diarrhea today, 3 days of blurred vision.

## 2024-09-03 ENCOUNTER — Other Ambulatory Visit: Payer: Self-pay | Admitting: Nurse Practitioner

## 2024-09-03 DIAGNOSIS — I10 Essential (primary) hypertension: Secondary | ICD-10-CM

## 2024-10-17 ENCOUNTER — Emergency Department (HOSPITAL_COMMUNITY): Payer: MEDICAID

## 2024-10-17 ENCOUNTER — Encounter (HOSPITAL_COMMUNITY): Payer: Self-pay

## 2024-10-17 ENCOUNTER — Emergency Department (HOSPITAL_COMMUNITY)
Admission: EM | Admit: 2024-10-17 | Discharge: 2024-10-17 | Discharge status: Against Medical Advice | Payer: MEDICAID | Attending: Emergency Medicine | Admitting: Emergency Medicine

## 2024-10-17 ENCOUNTER — Other Ambulatory Visit: Payer: Self-pay

## 2024-10-17 DIAGNOSIS — F17290 Nicotine dependence, other tobacco product, uncomplicated: Secondary | ICD-10-CM | POA: Insufficient documentation

## 2024-10-17 DIAGNOSIS — R0602 Shortness of breath: Secondary | ICD-10-CM | POA: Insufficient documentation

## 2024-10-17 DIAGNOSIS — Z5321 Procedure and treatment not carried out due to patient leaving prior to being seen by health care provider: Secondary | ICD-10-CM | POA: Diagnosis not present

## 2024-10-17 DIAGNOSIS — R079 Chest pain, unspecified: Secondary | ICD-10-CM | POA: Insufficient documentation

## 2024-10-17 LAB — BASIC METABOLIC PANEL WITH GFR
Anion gap: 13 (ref 5–15)
BUN: 7 mg/dL (ref 6–20)
CO2: 25 mmol/L (ref 22–32)
Calcium: 8.6 mg/dL — ABNORMAL LOW (ref 8.9–10.3)
Chloride: 99 mmol/L (ref 98–111)
Creatinine, Ser: 0.86 mg/dL (ref 0.44–1.00)
GFR, Estimated: >60 mL/min
Glucose, Bld: 80 mg/dL (ref 70–99)
Potassium: 3.5 mmol/L (ref 3.5–5.1)
Sodium: 138 mmol/L (ref 135–145)

## 2024-10-17 LAB — CBC
HCT: 28.2 % — ABNORMAL LOW (ref 36.0–46.0)
Hemoglobin: 9.6 g/dL — ABNORMAL LOW (ref 12.0–15.0)
MCH: 33.7 pg (ref 26.0–34.0)
MCHC: 34 g/dL (ref 30.0–36.0)
MCV: 98.9 fL (ref 80.0–100.0)
Platelets: 336 K/uL (ref 150–400)
RBC: 2.85 MIL/uL — ABNORMAL LOW (ref 3.87–5.11)
RDW: 17.9 % — ABNORMAL HIGH (ref 11.5–15.5)
WBC: 6.2 K/uL (ref 4.0–10.5)
nRBC: 0 % (ref 0.0–0.2)

## 2024-10-17 LAB — TROPONIN T, HIGH SENSITIVITY
Troponin T High Sensitivity: <15 ng/L (ref 0–19)
Troponin T High Sensitivity: <15 ng/L (ref 0–19)

## 2024-10-17 LAB — HCG, SERUM, QUALITATIVE: Preg, Serum: NEGATIVE

## 2024-10-17 MED ORDER — LORAZEPAM 1 MG PO TABS
1.0000 mg | ORAL_TABLET | Freq: Once | ORAL | Status: AC
  Administered 2024-10-17: 1 mg via ORAL
  Filled 2024-10-17: qty 1

## 2024-10-17 MED ORDER — THIAMINE MONONITRATE 100 MG PO TABS
100.0000 mg | ORAL_TABLET | Freq: Every day | ORAL | Status: DC
  Administered 2024-10-17: 100 mg via ORAL
  Filled 2024-10-17: qty 1

## 2024-10-17 MED ORDER — LORAZEPAM 2 MG/ML IJ SOLN: 1.0000 mg | INTRAMUSCULAR | Status: DC | PRN

## 2024-10-17 MED ORDER — THIAMINE HCL 100 MG/ML IJ SOLN: 100.0000 mg | Freq: Every day | INTRAMUSCULAR | Status: DC

## 2024-10-17 MED ORDER — ADULT MULTIVITAMIN W/MINERALS CH
1.0000 | ORAL_TABLET | Freq: Every day | ORAL | Status: DC
  Administered 2024-10-17: 1 via ORAL
  Filled 2024-10-17: qty 1

## 2024-10-17 MED ORDER — LORAZEPAM 1 MG PO TABS: 1.0000 mg | ORAL_TABLET | ORAL | Status: DC | PRN

## 2024-10-17 MED ORDER — FOLIC ACID 1 MG PO TABS
1.0000 mg | ORAL_TABLET | Freq: Every day | ORAL | Status: DC
  Administered 2024-10-17: 1 mg via ORAL
  Filled 2024-10-17: qty 1

## 2024-10-17 NOTE — ED Triage Notes (Signed)
 PT arrives via POV. Pt c/o chest pain and sob for the past couple of days. She also reports pain and swelling to left hand. Denies injury. Pt is AxOx4.

## 2024-10-17 NOTE — TOC CM/SW Note (Signed)
 TOC consult received for substance abuse. Follow-up to be completed with patient as appropriate.    Merilee Batty, MSN, RN Case Management [phone]

## 2024-10-17 NOTE — ED Triage Notes (Signed)
 Pt is short of breath at triage. Pt reporting sharp pains in her chest pt reports that her hand is swelling. And she is having tingling in her toes.

## 2024-10-17 NOTE — ED Notes (Signed)
 Pt name called multiple times by this RN and also xray tech for imaging. No answer

## 2024-10-17 NOTE — ED Provider Triage Note (Signed)
 Emergency Medicine Provider Triage Evaluation Note  Jessica Case , a 43 y.o. female  was evaluated in triage.  Pt complains of intermittent chest pain since yesterday with some shortness of breath.  She is auscultated about some left hand pain and swelling.  Unknown injury.  Reports cocaine use 3 days ago but not recently.  Does smoke cigars daily.  Daily alcohol use as well with last drink being this morning.  She denies any belly pain, nausea, vomiting.  Denies any recent cough or cold symptoms.  Review of Systems  Positive:  Negative:   Physical Exam  BP (!) 142/96 (BP Location: Left Arm)   Pulse (!) 101   Temp 98.1 F (36.7 C)   Resp 16   Ht 5' 1 (1.549 m)   Wt 49.9 kg   SpO2 98%   BMI 20.78 kg/m  Gen:   Awake, no distress   Resp:  Normal effort  MSK:   Moves extremities without difficulty  Other:  Abdomen soft and nontender.  Does have some mild swelling to the dorsum of the left hand.  Radial pulses are symmetric.  Moving hands and is on her phone with both hands as well.  Does not appear in any acute distress.  Medical Decision Making  Medically screening exam initiated at 6:57 PM.  Appropriate orders placed.  Jessica Case was informed that the remainder of the evaluation will be completed by another provider, this initial triage assessment does not replace that evaluation, and the importance of remaining in the ED until their evaluation is complete.  Patient is a daily alcohol consumer has been in the emerged from for 8 hours.  She is a little bit tachycardic however I do not see any tremors.  Will order her dose of Ativan  here.   Jessica Case, [place] 10/17/24 [id number]
